# Patient Record
Sex: Male | Born: 1950 | Race: White | Hispanic: No | Marital: Married | Smoking: Current every day smoker
Health system: Southern US, Community
[De-identification: ages and names within clinical notes are randomized; demographics above are authoritative.]

## PROBLEM LIST (undated history)

## (undated) DIAGNOSIS — J449 Chronic obstructive pulmonary disease, unspecified: Secondary | ICD-10-CM

## (undated) DIAGNOSIS — F419 Anxiety disorder, unspecified: Secondary | ICD-10-CM

## (undated) DIAGNOSIS — T7840XA Allergy, unspecified, initial encounter: Secondary | ICD-10-CM

## (undated) DIAGNOSIS — R351 Nocturia: Secondary | ICD-10-CM

## (undated) DIAGNOSIS — N401 Enlarged prostate with lower urinary tract symptoms: Secondary | ICD-10-CM

## (undated) DIAGNOSIS — K148 Other diseases of tongue: Secondary | ICD-10-CM

## (undated) DIAGNOSIS — K219 Gastro-esophageal reflux disease without esophagitis: Secondary | ICD-10-CM

## (undated) DIAGNOSIS — I739 Peripheral vascular disease, unspecified: Secondary | ICD-10-CM

## (undated) DIAGNOSIS — M1990 Unspecified osteoarthritis, unspecified site: Secondary | ICD-10-CM

## (undated) DIAGNOSIS — N529 Male erectile dysfunction, unspecified: Secondary | ICD-10-CM

## (undated) DIAGNOSIS — H269 Unspecified cataract: Secondary | ICD-10-CM

## (undated) DIAGNOSIS — E785 Hyperlipidemia, unspecified: Secondary | ICD-10-CM

## (undated) DIAGNOSIS — N138 Other obstructive and reflux uropathy: Secondary | ICD-10-CM

## (undated) DIAGNOSIS — C61 Malignant neoplasm of prostate: Secondary | ICD-10-CM

## (undated) DIAGNOSIS — M810 Age-related osteoporosis without current pathological fracture: Secondary | ICD-10-CM

## (undated) DIAGNOSIS — G2581 Restless legs syndrome: Secondary | ICD-10-CM

## (undated) HISTORY — PX: NOSE SURGERY: SHX723

## (undated) HISTORY — DX: Chronic obstructive pulmonary disease, unspecified: J44.9

## (undated) HISTORY — PX: HERNIA REPAIR: SHX51

## (undated) HISTORY — DX: Age-related osteoporosis without current pathological fracture: M81.0

## (undated) HISTORY — DX: Hyperlipidemia, unspecified: E78.5

## (undated) HISTORY — DX: Peripheral vascular disease, unspecified: I73.9

## (undated) HISTORY — DX: Restless legs syndrome: G25.81

## (undated) HISTORY — DX: Gastro-esophageal reflux disease without esophagitis: K21.9

## (undated) HISTORY — PX: KNEE SURGERY: SHX244

## (undated) HISTORY — PX: JOINT REPLACEMENT: SHX530

## (undated) HISTORY — DX: Unspecified cataract: H26.9

## (undated) HISTORY — DX: Unspecified osteoarthritis, unspecified site: M19.90

---

## 2002-07-27 ENCOUNTER — Ambulatory Visit (HOSPITAL_COMMUNITY): Admission: RE | Admit: 2002-07-27 | Discharge: 2002-07-27 | Payer: Self-pay | Admitting: Family Medicine

## 2002-07-27 ENCOUNTER — Encounter: Payer: Self-pay | Admitting: Family Medicine

## 2002-07-31 ENCOUNTER — Ambulatory Visit (HOSPITAL_COMMUNITY): Admission: RE | Admit: 2002-07-31 | Discharge: 2002-07-31 | Payer: Self-pay | Admitting: Family Medicine

## 2002-07-31 ENCOUNTER — Encounter: Payer: Self-pay | Admitting: Family Medicine

## 2002-08-03 ENCOUNTER — Encounter: Payer: Self-pay | Admitting: Family Medicine

## 2002-08-03 ENCOUNTER — Ambulatory Visit (HOSPITAL_COMMUNITY): Admission: RE | Admit: 2002-08-03 | Discharge: 2002-08-03 | Payer: Self-pay | Admitting: Family Medicine

## 2002-08-14 ENCOUNTER — Ambulatory Visit (HOSPITAL_COMMUNITY): Admission: RE | Admit: 2002-08-14 | Discharge: 2002-08-14 | Payer: Self-pay | Admitting: Family Medicine

## 2002-08-14 ENCOUNTER — Encounter: Payer: Self-pay | Admitting: Family Medicine

## 2003-06-04 ENCOUNTER — Ambulatory Visit (HOSPITAL_COMMUNITY): Admission: RE | Admit: 2003-06-04 | Discharge: 2003-06-04 | Payer: Self-pay | Admitting: Family Medicine

## 2005-07-27 ENCOUNTER — Ambulatory Visit (HOSPITAL_COMMUNITY): Admission: RE | Admit: 2005-07-27 | Discharge: 2005-07-27 | Payer: Self-pay | Admitting: Family Medicine

## 2008-07-20 ENCOUNTER — Ambulatory Visit: Payer: Self-pay | Admitting: Internal Medicine

## 2008-07-20 DIAGNOSIS — E785 Hyperlipidemia, unspecified: Secondary | ICD-10-CM | POA: Insufficient documentation

## 2008-07-20 DIAGNOSIS — M1990 Unspecified osteoarthritis, unspecified site: Secondary | ICD-10-CM | POA: Insufficient documentation

## 2008-07-20 DIAGNOSIS — F17200 Nicotine dependence, unspecified, uncomplicated: Secondary | ICD-10-CM | POA: Insufficient documentation

## 2008-07-25 LAB — CONVERTED CEMR LAB
ALT: 17 units/L (ref 0–53)
AST: 19 units/L (ref 0–37)
Albumin: 4.6 g/dL (ref 3.5–5.2)
Alkaline Phosphatase: 76 units/L (ref 39–117)
BUN: 11 mg/dL (ref 6–23)
CO2: 19 meq/L (ref 19–32)
Calcium: 8.5 mg/dL (ref 8.4–10.5)
Chloride: 107 meq/L (ref 96–112)
Cholesterol: 197 mg/dL (ref 0–200)
Creatinine, Ser: 0.94 mg/dL (ref 0.40–1.50)
Glucose, Bld: 78 mg/dL (ref 70–99)
HDL: 57 mg/dL (ref >39)
LDL Cholesterol: 117 mg/dL — ABNORMAL HIGH (ref 0–99)
Potassium: 4 meq/L (ref 3.5–5.3)
Sodium: 143 meq/L (ref 135–145)
Total Bilirubin: 0.6 mg/dL (ref 0.3–1.2)
Total CHOL/HDL Ratio: 3.5
Total Protein: 6.8 g/dL (ref 6.0–8.3)
Triglycerides: 116 mg/dL (ref <150)
VLDL: 23 mg/dL (ref 0–40)

## 2008-08-13 ENCOUNTER — Encounter (INDEPENDENT_AMBULATORY_CARE_PROVIDER_SITE_OTHER): Payer: Self-pay | Admitting: Internal Medicine

## 2008-08-23 ENCOUNTER — Encounter (INDEPENDENT_AMBULATORY_CARE_PROVIDER_SITE_OTHER): Payer: Self-pay | Admitting: Internal Medicine

## 2008-10-22 ENCOUNTER — Encounter (INDEPENDENT_AMBULATORY_CARE_PROVIDER_SITE_OTHER): Payer: Self-pay | Admitting: Internal Medicine

## 2008-11-02 ENCOUNTER — Encounter (INDEPENDENT_AMBULATORY_CARE_PROVIDER_SITE_OTHER): Payer: Self-pay | Admitting: Internal Medicine

## 2010-10-03 ENCOUNTER — Encounter: Payer: Self-pay | Admitting: Surgery

## 2010-10-06 ENCOUNTER — Encounter: Payer: Self-pay | Admitting: Surgery

## 2010-10-06 ENCOUNTER — Ambulatory Visit (INDEPENDENT_AMBULATORY_CARE_PROVIDER_SITE_OTHER): Payer: BC Managed Care – PPO | Admitting: Surgery

## 2010-10-06 VITALS — BP 161/97 | HR 57 | Ht 68.0 in | Wt 150.0 lb

## 2010-10-06 DIAGNOSIS — I70229 Atherosclerosis of native arteries of extremities with rest pain, unspecified extremity: Secondary | ICD-10-CM

## 2010-10-06 NOTE — Progress Notes (Signed)
Subjective:     Patient ID: Cory Foley, male   DOB: 05-23-1950, 60 y.o.   MRN: [id number] Chief complaint: Painful left fifth toe HPI  The patient is a 60 year old gentleman who is referred by Dr. Sharlet Salina for evaluation of a painful blue left fifth toe in the setting of arterial insufficiency. The patient has been complaining of claudication like symptoms which began in the beginning of July. Shortly thereafter, approximately 4-5 days, the patient began noticing a bluish discoloration in his left fifth toe. He has been having pain in this area ever sense. The pain comes on at random. He is now taking pain narcotics to alleviate his symptoms. He describes it as if a hot coal is touching his toe. There are no other aggravating factors.  The patient has been relatively healthy his whole life he does have a significant smoking history. In addition he has a family history was positive for coronary artery disease. Review of Systems  Constitutional: Positive for fatigue. Negative for fever and chills.  All other systems reviewed and are negative.   Past Medical History  Diagnosis Date  . SOB (shortness of breath) on exertion   . Arthritis   . PVD (peripheral vascular disease)     History  Substance Use Topics  . Smoking status: Current Everyday Smoker -- 0.5 packs/day for 42 years    Types: Cigarettes  . Smokeless tobacco: Not on file   Comment: Pt has reduced smoking to 1/4 ppd  . Alcohol Use: 10.8 oz/week    18 Cans of beer per week    Family History  Problem Relation Age of Onset  . Heart disease Mother     Valve regurgitation and Pacemaker   . Heart disease Father     CABG x 5  . Heart disease Sister     aortic valve replacement  . Cancer Sister     Colon cancer w/ metastasis    No Known Allergies  Current outpatient prescriptions:HYDROcodone-acetaminophen (LORTAB 5) 5-500 MG per tablet, Take 1 tablet by mouth every 4 (four) hours as needed.  , Disp: , Rfl:   Filed  Vitals:   10/06/10 1035  Height: 5\' 8"  (1.727 m)  Weight: 150 lb (68.04 kg)    Body mass index is 22.81 kg/(m^2).           Objective:   Physical Exam  Constitutional: He is oriented to person, place, and time. He appears well-developed and well-nourished.  HENT:  Head: Normocephalic and atraumatic.  Neck: Neck supple.  Cardiovascular: Normal rate, regular rhythm and normal heart sounds.        Palpable left popliteal pulse  Pulmonary/Chest: Effort normal. He has no wheezes. He has no rales.  Abdominal: Soft. He exhibits no mass.  Musculoskeletal: Normal range of motion. He exhibits no edema.  Lymphadenopathy:    He has no cervical adenopathy.  Neurological: He is alert and oriented to person, place, and time.  Skin: Skin is warm.       Left fifth toe is discolored with bluish discoloration down to the metatarsal head there are no open area      diagnostic studies: The patient comes with a ultrasound from radiology shows the right ABI is 1.1 the left ABI is 0.73 the left toe pressure is 51 findings were consistent with below knee infrapopliteal small vessel disease in the left leg Assessment:     Blue left fifth toe    Plan:     Most likely  the patient's findings are consistent with atherosclerotic disease within his left leg arterial system. The ultrasound findings to suggest this. I do believe he needs to be further evaluated with angiography. Hopefully at that time he will have her lesion that'll be amenable to angioplasty and/or stenting. I did discuss with him that he may not have options for revascularization or he may need to be a surgical candidate. I also reiterated that he is at risk for losing his fifth toe. We discussed the risks and benefits of the procedure including the risk of bleeding risk of distal embolization and possible emergent operation. I will plan on accessing the right groin insetting the left leg with intervention if possible. This procedure has  been scheduled for Tuesday, August 21  I did discuss the importance of smoking cessation as this is likely having an a profound effect on his health. He is in the process of trying to quit cigarettes.  I also discussed that this could be an embolic process we will proceed with angiography but if this does not appear to provide adequate explanation for his symptoms will need to undergo an embolic workup. In addition the patient will need a carotid ultrasound.  Place the patient on baby aspirin today.

## 2010-10-09 ENCOUNTER — Ambulatory Visit (INDEPENDENT_AMBULATORY_CARE_PROVIDER_SITE_OTHER): Payer: BC Managed Care – PPO | Admitting: Family Medicine

## 2010-10-09 ENCOUNTER — Encounter: Payer: Self-pay | Admitting: Family Medicine

## 2010-10-09 VITALS — BP 120/80 | HR 66 | Ht 67.5 in | Wt 156.1 lb

## 2010-10-09 DIAGNOSIS — E785 Hyperlipidemia, unspecified: Secondary | ICD-10-CM

## 2010-10-09 DIAGNOSIS — F17200 Nicotine dependence, unspecified, uncomplicated: Secondary | ICD-10-CM

## 2010-10-09 DIAGNOSIS — I739 Peripheral vascular disease, unspecified: Secondary | ICD-10-CM

## 2010-10-09 NOTE — Assessment & Plan Note (Signed)
Discussed cessation of tobacco. We also discussed his per old vascular disease in the setting of visits of his tobacco use. He states he's tried Chantix, patches in the past which have not helped. He would like to quit smoking on his own

## 2010-10-09 NOTE — Assessment & Plan Note (Signed)
Patient will have fasting lipid panel done. I'm very concerned as he now has peripheral vascular disease his cholesterol may be severely elevated

## 2010-10-09 NOTE — Progress Notes (Signed)
  Subjective:    Patient ID: Cory Foley, male    DOB: 1950-05-31, 60 y.o.   MRN: [id number]  HPI Patient here to establish care. He has not seen a primary care provider in greater than 2 years. Medications and history were reviewed He has no specific concerns today.  Peripheral vascular disease- July of 2012 patient noticed that he was having cramping with exertion, in his lower extremities. Subsequently thereafter he noticed that his left fifth digit on his foot was turning blue. He was seen by a foot specialist in [place]. There was concern for peripheral vascular disease after imaging was obtained at [facility]. Patient was seen this week by a vascular specialist in [place]. He is set up for catheterization intervention for his peripheral vascular disease on next Tuesday. He has been using hydrocodone very rarely for his pain. He prefers not to take any medications. Rest does help. His fifth digit has a very severe burning sensation. He's been using a Betadine cream given by the foot doctor secondary concern for dry gangrene that may set in due to poor circulation. He denies any chest pain, no history of heart attack, no history of stroke.  He declines Colonoscopy Over due for labs and cholesterol panel Overdue for physical exam  No history of high blood pressure, diabetes, he has had mildly elevated cholesterol in the past  Tobacco- he has cut back on his cigarette smoking. He has smoked for greater than 30 years  Review of Systems  GEN- denies fatigue, fever, weight loss,weakness, recent illness CVS- denies chest pain, palpitations RESP- occ SOB, cough, wheeze ABD- denies N/V, change in stools, abd pain GU- denies dysuria, hematuria, dribbling, incontinence MSK- denies joint pain,+ muscle aches, injury Neuro- denies headache, dizziness, syncope, seizure activity      Objective:   Physical Exam GEN- NAD, alert and oriented x3 HEENT- PERRL, EOMI, non injected sclera,  pink conjunctiva, MMM, oropharynx clear Neck- Supple, no thryomegaly, no carotid bruit CVS- RRR, no murmur RESP-CTAB Abd- no abdominal bruit EXT- No edema Pulses- Radial 2+ bilat,  DP- 1+ Left, 2+ right, Popliteal- 2+        Assessment & Plan:

## 2010-10-09 NOTE — Patient Instructions (Signed)
When you get your labs done, do not eat after midnight Schedule a physical after you have had your surgery for your leg  I advise you to quit smoking. It was great to meet you today.

## 2010-10-09 NOTE — Assessment & Plan Note (Signed)
The patient establish with vascular surgery. Will followup status post intervention.

## 2010-10-13 LAB — COMPREHENSIVE METABOLIC PANEL
ALT: 15 U/L (ref 0–53)
AST: 15 U/L (ref 0–37)
Albumin: 3.9 g/dL (ref 3.5–5.2)
Alkaline Phosphatase: 67 U/L (ref 39–117)
BUN: 10 mg/dL (ref 6–23)
CO2: 26 mEq/L (ref 19–32)
Calcium: 9.2 mg/dL (ref 8.4–10.5)
Chloride: 108 mEq/L (ref 96–112)
Creat: 0.84 mg/dL (ref 0.50–1.35)
Glucose, Bld: 92 mg/dL (ref 70–99)
Potassium: 5.3 mEq/L (ref 3.5–5.3)
Sodium: 143 mEq/L (ref 135–145)
Total Bilirubin: 0.3 mg/dL (ref 0.3–1.2)
Total Protein: 6.3 g/dL (ref 6.0–8.3)

## 2010-10-14 ENCOUNTER — Other Ambulatory Visit (HOSPITAL_COMMUNITY): Payer: BC Managed Care – PPO

## 2010-10-14 ENCOUNTER — Ambulatory Visit (HOSPITAL_COMMUNITY): Payer: BC Managed Care – PPO

## 2010-10-14 ENCOUNTER — Ambulatory Visit (HOSPITAL_COMMUNITY)
Admission: RE | Admit: 2010-10-14 | Discharge: 2010-10-14 | Disposition: A | Discharge status: Home / Self Care | Payer: BC Managed Care – PPO | Source: Ambulatory Visit | Attending: Surgery | Admitting: Surgery

## 2010-10-14 DIAGNOSIS — I70219 Atherosclerosis of native arteries of extremities with intermittent claudication, unspecified extremity: Secondary | ICD-10-CM

## 2010-10-14 LAB — TYPE AND SCREEN
ABO/RH(D): A POS
Antibody Screen: NEGATIVE

## 2010-10-14 LAB — POCT I-STAT, CHEM 8
BUN: 9 mg/dL (ref 6–23)
Calcium, Ion: 1.16 mmol/L (ref 1.12–1.32)
Chloride: 108 mEq/L (ref 96–112)
Creatinine, Ser: 1 mg/dL (ref 0.50–1.35)
Glucose, Bld: 90 mg/dL (ref 70–99)
HCT: 43 % (ref 39.0–52.0)
Hemoglobin: 14.6 g/dL (ref 13.0–17.0)
Potassium: 5 mEq/L (ref 3.5–5.1)
Sodium: 142 mEq/L (ref 135–145)
TCO2: 26 mmol/L (ref 0–100)

## 2010-10-14 LAB — CBC
HCT: 38.1 % — ABNORMAL LOW (ref 39.0–52.0)
Hemoglobin: 12.9 g/dL — ABNORMAL LOW (ref 13.0–17.0)
MCH: 33.3 pg (ref 26.0–34.0)
MCHC: 33.9 g/dL (ref 30.0–36.0)
MCV: 98.4 fL (ref 78.0–100.0)
Platelets: 169 10*3/uL (ref 150–400)
RBC: 3.87 MIL/uL — ABNORMAL LOW (ref 4.22–5.81)
RDW: 14 % (ref 11.5–15.5)
WBC: 4.7 10*3/uL (ref 4.0–10.5)

## 2010-10-14 LAB — COMPREHENSIVE METABOLIC PANEL
ALT: 13 U/L (ref 0–53)
AST: 15 U/L (ref 0–37)
Albumin: 3 g/dL — ABNORMAL LOW (ref 3.5–5.2)
Alkaline Phosphatase: 67 U/L (ref 39–117)
BUN: 10 mg/dL (ref 6–23)
CO2: 26 mEq/L (ref 19–32)
Calcium: 8.5 mg/dL (ref 8.4–10.5)
Chloride: 110 mEq/L (ref 96–112)
Creatinine, Ser: 0.71 mg/dL (ref 0.50–1.35)
GFR calc Af Amer: >60 mL/min (ref >60)
GFR calc non Af Amer: >60 mL/min (ref >60)
Glucose, Bld: 123 mg/dL — ABNORMAL HIGH (ref 70–99)
Potassium: 3.8 mEq/L (ref 3.5–5.1)
Sodium: 143 mEq/L (ref 135–145)
Total Bilirubin: 0.3 mg/dL (ref 0.3–1.2)
Total Protein: 5.7 g/dL — ABNORMAL LOW (ref 6.0–8.3)

## 2010-10-14 LAB — SURGICAL PCR SCREEN
MRSA, PCR: NEGATIVE
Staphylococcus aureus: NEGATIVE

## 2010-10-14 LAB — ABO/RH: ABO/RH(D): A POS

## 2010-10-14 LAB — PROTIME-INR
INR: 0.88 (ref 0.00–1.49)
Prothrombin Time: 12.1 seconds (ref 11.6–15.2)

## 2010-10-14 LAB — APTT: aPTT: 27 seconds (ref 24–37)

## 2010-10-16 ENCOUNTER — Other Ambulatory Visit (HOSPITAL_COMMUNITY): Payer: BC Managed Care – PPO

## 2010-10-17 ENCOUNTER — Ambulatory Visit (INDEPENDENT_AMBULATORY_CARE_PROVIDER_SITE_OTHER): Payer: BC Managed Care – PPO

## 2010-10-17 ENCOUNTER — Other Ambulatory Visit (INDEPENDENT_AMBULATORY_CARE_PROVIDER_SITE_OTHER): Payer: BC Managed Care – PPO | Admitting: *Deleted

## 2010-10-17 DIAGNOSIS — Z01810 Encounter for preprocedural cardiovascular examination: Secondary | ICD-10-CM

## 2010-10-17 DIAGNOSIS — I739 Peripheral vascular disease, unspecified: Secondary | ICD-10-CM

## 2010-10-20 ENCOUNTER — Encounter: Payer: Self-pay | Admitting: Surgery

## 2010-10-20 ENCOUNTER — Ambulatory Visit (INDEPENDENT_AMBULATORY_CARE_PROVIDER_SITE_OTHER): Payer: BC Managed Care – PPO | Admitting: Surgery

## 2010-10-20 VITALS — BP 151/90 | HR 53 | Resp 20 | Ht 67.5 in | Wt 155.0 lb

## 2010-10-20 DIAGNOSIS — L98499 Non-pressure chronic ulcer of skin of other sites with unspecified severity: Secondary | ICD-10-CM

## 2010-10-20 DIAGNOSIS — I739 Peripheral vascular disease, unspecified: Secondary | ICD-10-CM

## 2010-10-20 NOTE — Progress Notes (Signed)
Subjective:     Patient ID: Cory Foley, male   DOB: [birth year], 60 y.o.   MRN: [id number]  HPI  This is a 60 year old gentleman that I initially saw at the request of Dr. Sharlet Salina for evaluation of a painful blue left fifth toe in the setting of arterial insufficiency. The patient also complained of claudication like symptoms which began in July. Shortly after his symptoms began he noticed a bluish discoloration of his left fifth toe. He has been having difficulty since that time. The pain comes on at random there are no alleviating factors except for pain medicine he describes as feeling as a hot: That his toe.  The patient recently underwent angiography which revealed an occluded left popliteal artery. He was not a good candidate for endovascular repair and comes back today for discussions of surgical treatment. Last week he was sent for right Myoview carotid ultrasound and leg vein mapping.  The patient has history that is positive for tobacco. He also suffers from arthritis. We have previously discussed the need for smoking cessation  Review of Systems Pertinent review of systems are detailed in the history of present illness. All other systems are negative as documented in the patient encountered form    Past Medical History  Diagnosis Date  . SOB (shortness of breath) on exertion   . PVD (peripheral vascular disease)   . Arthritis     knees    History  Substance Use Topics  . Smoking status: Current Everyday Smoker -- 0.5 packs/day for 42 years    Types: Cigarettes  . Smokeless tobacco: Not on file   Comment: Pt has reduced smoking to 1/4 ppd  . Alcohol Use: 10.8 oz/week    18 Cans of beer per week    Family History  Problem Relation Age of Onset  . Heart disease Mother     Valve regurgitation and Pacemaker   . Heart disease Father     CABG x 5  . Heart disease Sister     aortic valve replacement  . Cancer Sister     Colon cancer w/ metastasis    No Known  Allergies  Current outpatient prescriptions:aspirin 81 MG tablet, Take 81 mg by mouth daily.  , Disp: , Rfl: ;  HYDROcodone-acetaminophen (LORTAB 5) 5-500 MG per tablet, Take 1 tablet by mouth every 4 (four) hours as needed.  , Disp: , Rfl:   Filed Vitals:   10/20/10 0905  Height: 5' 7.5" (1.715 m)  Weight: 155 lb (70.308 kg)    Body mass index is 23.92 kg/(m^2).       Objective:   Physical Exam  Constitutional: He is oriented to person, place, and time. He appears well-developed and well-nourished.  HENT:  Head: Normocephalic and atraumatic.  Neck: Neck supple.  Cardiovascular: Normal rate and regular rhythm.        Pedal pulses not palpable on the left  Pulmonary/Chest: Effort normal.  Abdominal: Soft.  Musculoskeletal: Normal range of motion.  Neurological: He is alert and oriented to person, place, and time.  Skin: Skin is warm.       The left fifth toe is slightly more bluish in discoloration. There is the beginnings of an ulcer on the plantar side of the toe.   Diagnostic studies: Cardiac Myoview is normal carotid duplex shows minimal disease bilaterally. Vein mapping shows adequate saphenous vein from hip to knee on the left    Assessment:     Left fifth toe wound, peripheral  vascular disease    Plan:     I discussed the angiographic findings today with the patient. I do not think he is a good candidate for endovascular repair and would be best treated with surgical bypass. I believe this can be an above-knee to below-knee popliteal artery bypass graft with saphenous vein harvested from the ipsilateral leg. We discussed the risks and benefits of the operation including the risk of infection the risk of edema wound healing and the recovery time. We also discussed that his left fifth toe may not be salvageable. However, I feel that observation after the bypass graft is the best option to see if the toe demarcates or heels. His operation has been scheduled for tomorrow  August 28.

## 2010-10-21 ENCOUNTER — Other Ambulatory Visit: Payer: Self-pay | Admitting: Surgery

## 2010-10-21 ENCOUNTER — Inpatient Hospital Stay (HOSPITAL_COMMUNITY): Payer: BC Managed Care – PPO

## 2010-10-21 ENCOUNTER — Inpatient Hospital Stay (HOSPITAL_COMMUNITY)
Admission: RE | Admit: 2010-10-21 | Discharge: 2010-10-24 | DRG: 797 | Disposition: A | Discharge status: Home / Self Care | Payer: BC Managed Care – PPO | Source: Ambulatory Visit | Attending: Surgery | Admitting: Surgery

## 2010-10-21 DIAGNOSIS — F17200 Nicotine dependence, unspecified, uncomplicated: Secondary | ICD-10-CM | POA: Diagnosis present

## 2010-10-21 DIAGNOSIS — Z96659 Presence of unspecified artificial knee joint: Secondary | ICD-10-CM

## 2010-10-21 DIAGNOSIS — I70219 Atherosclerosis of native arteries of extremities with intermittent claudication, unspecified extremity: Principal | ICD-10-CM | POA: Diagnosis present

## 2010-10-21 DIAGNOSIS — Z7982 Long term (current) use of aspirin: Secondary | ICD-10-CM

## 2010-10-21 HISTORY — PX: PR VEIN BYPASS GRAFT,AORTO-FEM-POP: 35551

## 2010-10-21 LAB — TYPE AND SCREEN
ABO/RH(D): A POS
Antibody Screen: NEGATIVE

## 2010-10-22 DIAGNOSIS — Z48812 Encounter for surgical aftercare following surgery on the circulatory system: Secondary | ICD-10-CM

## 2010-10-22 LAB — BASIC METABOLIC PANEL
BUN: 7 mg/dL (ref 6–23)
CO2: 28 mEq/L (ref 19–32)
Calcium: 8.6 mg/dL (ref 8.4–10.5)
Chloride: 108 mEq/L (ref 96–112)
Creatinine, Ser: 0.77 mg/dL (ref 0.50–1.35)
GFR calc Af Amer: >60 mL/min (ref >60)
GFR calc non Af Amer: >60 mL/min (ref >60)
Glucose, Bld: 115 mg/dL — ABNORMAL HIGH (ref 70–99)
Potassium: 4.3 mEq/L (ref 3.5–5.1)
Sodium: 141 mEq/L (ref 135–145)

## 2010-10-22 LAB — CBC
HCT: 37.6 % — ABNORMAL LOW (ref 39.0–52.0)
Hemoglobin: 12.6 g/dL — ABNORMAL LOW (ref 13.0–17.0)
MCH: 33.5 pg (ref 26.0–34.0)
MCHC: 33.5 g/dL (ref 30.0–36.0)
MCV: 100 fL (ref 78.0–100.0)
Platelets: 165 10*3/uL (ref 150–400)
RBC: 3.76 MIL/uL — ABNORMAL LOW (ref 4.22–5.81)
RDW: 13.9 % (ref 11.5–15.5)
WBC: 6.5 10*3/uL (ref 4.0–10.5)

## 2010-10-23 LAB — BASIC METABOLIC PANEL
BUN: 8 mg/dL (ref 6–23)
CO2: 30 mEq/L (ref 19–32)
Calcium: 8.8 mg/dL (ref 8.4–10.5)
Chloride: 106 mEq/L (ref 96–112)
Creatinine, Ser: 0.78 mg/dL (ref 0.50–1.35)
GFR calc Af Amer: >60 mL/min (ref >60)
GFR calc non Af Amer: >60 mL/min (ref >60)
Glucose, Bld: 105 mg/dL — ABNORMAL HIGH (ref 70–99)
Potassium: 4.8 mEq/L (ref 3.5–5.1)
Sodium: 140 mEq/L (ref 135–145)

## 2010-10-23 LAB — CARDIAC PANEL(CRET KIN+CKTOT+MB+TROPI)
CK, MB: 1.7 ng/mL (ref 0.3–4.0)
Relative Index: INVALID (ref 0.0–2.5)
Total CK: 65 U/L (ref 7–232)
Troponin I: <0.3 ng/mL (ref <0.30)

## 2010-10-23 NOTE — Op Note (Signed)
  Cory Foley, DEVONSHIRE                ACCOUNT NO.:  [id number]  MEDICAL RECORD NO.:  [id number]  LOCATION:  [place]                         FACILITY:  [facility]  PHYSICIAN:  Juleen China IV, MDDATE OF BIRTH:  [birth year]  DATE OF PROCEDURE:  10/14/2010 DATE OF DISCHARGE:                              OPERATIVE REPORT   PREOPERATIVE DIAGNOSIS:  Left fifth toe ischemia.  POSTOPERATIVE DIAGNOSIS:  Left fifth toe ischemia.  PROCEDURES PERFORMED: 1. Ultrasound access, right femoral artery. 2. Abdominal aortogram. 3. Bilateral runoff. 4. Second-order catheterization.  INDICATIONS:  This is a 60 year old gentleman who presented to the office with left fifth toe ischemia.  An ultrasound that showed the arterial occlusive disease.  He comes today for arteriogram and possible intervention.  PROCEDURE:  The patient was identified in the holding and taken to room #8, placed supine on the table.  Both groins were prepped and draped in the usual fashion. A time-out was called.  The tight femoral artery was divided with ultrasound and found to be widely patent.  A digital ultrasound image was acquired.  The right femoral artery was then accessed under ultrasound guidance with an 18-gauge needle and an 0.035 wire was advanced into the aorta under fluoroscopic visualization.  A 5- French sheath was placed.  Over the wire, an Omni flush catheter was advanced to the level of L1.  Abdominal aortogram was obtained.  Next, using the Omni flush catheter and Bentson wire, the aortic bifurcation was crossed.  Catheter was placed in the left external iliac artery and left leg runoff was performed.  Retrograde injections through the sheath were done to evaluate the right leg.  FINDINGS:  Aortogram:  The visualized portions of suprarenal abdominal aorta showed no significant disease.  There are single renal arteries bilaterally which are widely patent.  The infrarenal abdominal aorta is widely patent.   Bilateral common external and internal iliac arteries are widely patent.  Left lower extremity:  Left common femoral artery is widely patent. Left profunda femoral is widely patent.  Left superficial femoral artery is widely patent.  There is occlusion of the popliteal artery behind the knee at the level of the patella with reconstitution from large popliteal collaterals of the below-knee popliteal artery.  There does appear to be three-vessel runoff.  Right lower extremity:  The right common femoral artery is widely patent.  The right profunda femoral artery is widely patent.  The right superficial femoral artery is widely patent.  Right popliteal artery is widely patent.  There is three-vessel runoff.  After above images were obtained, decision was made to terminate the procedure.  Catheters and wires were removed.  The patient was taken to the holding area for sheath pull.  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  The patient is not a good candidate for percutaneous revascularization and consideration will be made for surgical repair.     Jorge Ny, MD     VWB/MEDQ  D:  10/14/2010  T:  10/14/2010  Job:  [id number]  Electronically Signed by Arelia Longest IV MD on 10/23/2010 12:23:44 AM

## 2010-10-23 NOTE — Op Note (Signed)
NAMESAMARION, EHLE                ACCOUNT NO.:  [id number]  MEDICAL RECORD NO.:  [id number]  LOCATION:  3305                         FACILITY:  [facility]  PHYSICIAN:  Juleen China IV, MDDATE OF BIRTH:  1950-05-06  DATE OF PROCEDURE:  10/21/2010 DATE OF DISCHARGE:                              OPERATIVE REPORT   PREOPERATIVE DIAGNOSIS:  Ischemic left toe.  POSTOPERATIVE DIAGNOSIS:  Ischemic left toe.  PROCEDURES PERFORMED: 1. Left above-knee to below-knee popliteal artery bypass graft with     reversed ipsilateral greater saphenous vein. 2. Intraoperative angiogram.  SURGEON: 1. Charlena Cross, MD  ASSISTANT:  Pecola Leisure, PA  ANESTHESIA:  General.  BLOOD LOSS:  100 mL.  SPECIMENS:  Popliteal thrombus.  INDICATIONS:  This is a 60 year old gentleman who initially presented with an ischemic toe for several weeks.  He was taken for angiogram and found to have an occluded popliteal artery.  He comes in today for his bypass.  PROCEDURE:  The patient was identified in the holding area, taken to room 7, and placed supine on the table.  General anesthesia was administered.  The patient was prepped and draped in usual fashion. Time-out was called.  Antibiotics were given.  Ultrasound was used to map the course of the cephalic vein in the leg.  I initially began by making a below-knee incision.  The saphenous vein was visualized within this area and felt to not be an adequate conduit.  I proceeded with exposure of the popliteal artery through this incision.  Popliteal artery was approximately 3.5 mm without significant calcification. Next, an incision was made for the above-knee popliteal artery exposure. The artery had an excellent pulse within it.  It was fully exposed. Through the same incision, I exposed the greater saphenous vein.  This measured approximately 3.5-4 mm.  It was mobilized throughout the length of this incision and a more proximal incision was also  made until adequate length of saphenous vein had been harvested.  Side branches were ligated between 3-0 silk ties and metal clips.  Next, I used an uterine dressing forceps to create a tunnel between the 2 incisions between the heads of gastrocnemius muscle.  An umbilical tape was passed through this tunnel.  I also used the umbilical tape to ensure that I had adequate length of vein.  Next, a right angle clamp was placed proximally and distally on the vein and the vein was transected.  Each end was tied off with a 2-0 silk tie.  At this point, the patient was fully heparinized.  The vein was then distended.  It distended nicely to about 4 mm.  At this point and time, heparin had circulated.  I placed a stockinette on the upper thigh followed by tourniquet.  The leg was exsanguinated with an Esmarch.  Tourniquet was taken at 250 mm of pressure.  Next, I used an 11 blade to open the above-knee popliteal artery, which was extended longitudinally with Potts scissors.  I placed the vein in a reversed fashion and spatulated the vein to fit the size of the arteriotomy.  A running end-to-side anastomosis was created with 5-0 Prolene.  Once the anastomosis was completed,  the tourniquet was let down.  Anastomosis was hemostatic, and there was excellent pulsatile flow through the vein graft.  Next, the vein was brought through the previously created tunnel making sure to maintain proper orientation. When the vein was brought through the tunnel, I re-exsanguinated the leg with an Esmarch and the tourniquet was reinflated to 250 mm of pressure. Next, an 11 blade was used to make an arteriotomy, which was extended longitudinally with Potts scissors in the popliteal artery.  I could see a piece of subacute thrombus within the proximal popliteal vein.  I pulled this out.  It was about a 2.5-cm piece of thrombus.  This was sent for pathology.  I then cut the vein to the appropriate length and then  spatulated it to fit the size of the arteriotomy.  A running anastomosis was created with 6-0 Prolene prior to completion. Appropriate flush maneuvers were performed and the anastomosis was completed.  I did have Doppler signals in the anterior tibial and posterior tibial artery at the end of the case, which dampened with graft compression.  An intraoperative arteriogram was performed, which showed no evidence of distal bypass graft stenosis and runoff that was similar to his angiogram.  At this point, the patient's heparin was reversed with 50 mg of protamine.  Once the hemostasis was achieved, I closed the vein harvest site with 2 layers of 3-0 Vicryl.  In the above and below-knee incision, the fascia was reapproximated with 2-0 Vicryl. Subcutaneous tissue was closed with 3-0 Vicryl and the skin was closed with 4-0 Vicryl.  Dermabond was placed in the wounds.  The patient tolerated the procedure well with no complications.     Jorge Ny, MD     VWB/MEDQ  D:  10/21/2010  T:  10/21/2010  Job:  [id number]  Electronically Signed by Arelia Longest IV MD on 10/23/2010 12:23:50 AM

## 2010-11-10 ENCOUNTER — Telehealth: Payer: Self-pay

## 2010-11-10 NOTE — Procedures (Unsigned)
VASCULAR LAB EXAM  INDICATION:  Preop vein mapping for lower extremity bypass graft.  HISTORY: Diabetes: Cardiac: Hypertension:  EXAM:  The left great saphenous vein was mapped and found to be patent with calibers ranging from 0.45 cm to 0.19 cm. Please see attached diagram for details.  IMPRESSION:  Patent left great saphenous vein with calibers as described on attached diagram.  ___________________________________________ V. Charlena Cross, MD  LT/MEDQ  D:  10/17/2010  T:  10/17/2010  Job:  [id number]

## 2010-11-10 NOTE — Telephone Encounter (Signed)
Pt. called w/ c/o continued "swelling of left foot and lower leg, up to just above calf".  "It almost goes away at night, and during day it starts to swell again."  States that Dr. Myra Gianotti told him to expect swelling about 1-2 weeks, and tomorrow will be the 3rd wk. since surgery; pt. wants to have Dr. Myra Gianotti made aware, and advise if this is normal or concerning?  Pt. denies increased pain.  States his "incisions look fine".   Describes his swelling as "3x the normal size".  States there is some warmth and slight redness at shin.  Denies redness/warmth @ calf.  Takes ASA 81 mg qd.  Informed Dr. Myra Gianotti of pt's concerns.  States pt. should continue to monitor/ give it more time and continue to elevate.  Pt. to call if sx's worsen.  Has f/u appt. on 11/24/10.   Verbalized understanding of instructions.

## 2010-11-10 NOTE — Procedures (Unsigned)
CAROTID DUPLEX EXAM  INDICATION:  Preop evaluation for planned lower extremity bypass graft.  HISTORY: Diabetes:  No. Cardiac:  No. Hypertension:  No. Smoking:  Yes. Previous Surgery:  No. CV History: Amaurosis Fugax No, Paresthesias No, Hemiparesis No                                      RIGHT             LEFT Brachial systolic pressure: Brachial Doppler waveforms: Vertebral direction of flow:        Antegrade         Antegrade DUPLEX VELOCITIES (cm/sec) CCA peak systolic                   101               84 ECA peak systolic                   55                69 ICA peak systolic                   63                52 ICA end diastolic                   23                18 PLAQUE MORPHOLOGY: PLAQUE AMOUNT:                      Minimal           Minimal PLAQUE LOCATION:   IMPRESSION: 1. No evidence of obvious plaque in the bilateral internal carotid     artery. 2. Bilateral vertebral arteries are within normal limits.          ___________________________________________ V. Charlena Cross, MD  LT/MEDQ  D:  10/17/2010  T:  10/17/2010  Job:  [id number]

## 2010-11-17 ENCOUNTER — Encounter: Payer: Self-pay | Admitting: Surgery

## 2010-11-21 ENCOUNTER — Encounter: Payer: Self-pay | Admitting: Surgery

## 2010-11-22 NOTE — Discharge Summary (Signed)
Cory Foley, Cory Foley                ACCOUNT NO.:  [id number]  MEDICAL RECORD NO.:  [id number]  LOCATION:  2011                         FACILITY:  [facility]  PHYSICIAN:  Juleen China IV, MDDATE OF BIRTH:  1950-05-07  DATE OF ADMISSION:  10/21/2010 DATE OF DISCHARGE:  10/24/2010                              DISCHARGE SUMMARY   HISTORY OF PRESENT ILLNESS:  This is a 60 year old male that was initially seen at the request of Dr. Sharlet Salina for evaluation of a painful blue left fifth toe in the setting of arterial insufficiency. The patient also complained of claudication-like symptoms, which began in July.  Shortly after his symptoms began, he noticed a bluish discoloration of his left fifth toe.  He has had difficulty since then. There are no alleviating factors except for pain medicine, he describes it feeling of hot in that toe.  The patient recently underwent angiography, which revealed an occluded left popliteal artery.  He was not a good candidate for endovascular repair and comes back for discussion of surgical treatment.  HOSPITAL COURSE:  The patient was admitted to the hospital and taken to the operating room on October 21, 2010 when he underwent a left above-the- knee to below-knee popliteal artery bypass graft with reverse ipsilateral greater saphenous vein and intraoperative angiogram.  He tolerated the procedure well and was transported to the recovery room in satisfactory condition.  By postoperative day #1, he had unsteady gait, but otherwise was doing well.  He had positive palpable dorsalis pedis and posterior tibialis pulses on the left.  His postoperative ABIs included the right was 1.11 and the left was 0.93.  He did complain of a low chest pressure on postoperative day #2 that went away with incentive spirometry and EKG and 1 set of cardiac enzymes were obtained and they were both normal.  By postoperative day #3, the patient was ambulating much better without a  rolling walker and was discharged to home.  He continues to have palpable pedal pulses.  Otherwise, his postoperative course included increasing intake of solids without difficulty.  DISCHARGE INSTRUCTIONS:  He is discharged home with extensive instructions on wound care and progressive ambulation.  He is instructed not to drive or perform any heavy lifting for 1 month.  DISCHARGE DIAGNOSES: 1. Peripheral vascular disease.     a.     Status post left above-knee to below-knee popliteal bypass      on October 21, 2010. 2. Arthritis. 3. Chronic tobacco use. 4. History of hernia repair, bilateral inguinal herniorrhaphies x2. 5. History of joint replacement. 6. History of nose surgery. 7. History of knee surgery.  DISCHARGE MEDICATIONS: 1. Nicotine patch 21 mg daily. 2. Vicodin 5/325 one to two p.o. q.4-6 h. p.r.n. pain, #31, no refill. 3. Aspirin 81 mg p.o. daily.  FOLLOWUP:  The patient to follow with Dr. Myra Gianotti in 2 weeks.     Newton Pigg, PA   ______________________________ V. Charlena Cross, MD    SE/MEDQ  D:  10/24/2010  T:  10/24/2010  Job:  [id number]  Electronically Signed by Newton Pigg PA on 10/28/2010 08:41:42 AM Electronically Signed by Arelia Longest IV MD on 11/22/2010 09:55:28 AM

## 2010-11-24 ENCOUNTER — Ambulatory Visit (INDEPENDENT_AMBULATORY_CARE_PROVIDER_SITE_OTHER): Payer: BC Managed Care – PPO | Admitting: Surgery

## 2010-11-24 ENCOUNTER — Encounter: Payer: Self-pay | Admitting: Surgery

## 2010-11-24 VITALS — BP 128/81 | HR 75 | Temp 98.5°F | Ht 68.0 in | Wt 162.0 lb

## 2010-11-24 DIAGNOSIS — Z95828 Presence of other vascular implants and grafts: Secondary | ICD-10-CM

## 2010-11-24 DIAGNOSIS — Z9889 Other specified postprocedural states: Secondary | ICD-10-CM

## 2010-11-24 DIAGNOSIS — I739 Peripheral vascular disease, unspecified: Secondary | ICD-10-CM

## 2010-11-24 NOTE — Progress Notes (Signed)
The patient returns today for followup. He underwent left above to below knee popliteal bypass graft with reversed vein on 10/21/2010. This was done in the setting of a bluish discoloration to his left fifth toe and none of popliteal occlusion his postoperative course was uncomplicated he is back today for followup he has no complaints today.  On examination he does have 1-2+ edema in the left leg. The scab and on the left fifth toe is gone the color is much improved he has no pain in the left fifth toe his incisions are healing nicely there is a slight area of skin separation in the below-knee incision there is no drainage there is no evidence of infection he has a palpable dorsalis pedis pulse  Overall I think he is doing very well. His biggest issue his swelling like to put him in a compression stocking however with the slight skin separation in his below-knee incision and await approximately 2 weeks before I do this so they can fully healed. In the meantime he'll continue to keep his left leg elevated as suspected in 2-3 weeks he'll be able to return to work. He was placed on her ultrasound surveillance protocol I will see him back in 3 months with a repeat ultrasound.

## 2010-12-01 ENCOUNTER — Other Ambulatory Visit: Payer: Self-pay

## 2010-12-01 DIAGNOSIS — G8918 Other acute postprocedural pain: Secondary | ICD-10-CM

## 2010-12-01 MED ORDER — HYDROCODONE-ACETAMINOPHEN 5-500 MG PO TABS: 1.0000 | ORAL_TABLET | Freq: Four times a day (QID) | ORAL | Status: DC | PRN

## 2010-12-01 NOTE — Telephone Encounter (Signed)
Pt. Requests refill of Hydrocodone/Acetaminophen.  States uses mostly at night.  Rates discomfort at level 3/10.  S/p left  AK-BK popliteal BP of 10/21/10.  Per Dr. Myra Gianotti, okay to order Hydrocodone/acetaminophen per s.o.  Pt. Advised will call in refill to his pharmacy.

## 2011-01-12 ENCOUNTER — Encounter: Payer: Self-pay | Admitting: Surgery

## 2011-03-02 ENCOUNTER — Other Ambulatory Visit: Payer: BC Managed Care – PPO

## 2011-03-02 ENCOUNTER — Ambulatory Visit: Payer: BC Managed Care – PPO | Admitting: Surgery

## 2011-03-20 ENCOUNTER — Encounter: Payer: Self-pay | Admitting: Surgery

## 2011-03-23 ENCOUNTER — Other Ambulatory Visit: Payer: BC Managed Care – PPO

## 2011-03-23 ENCOUNTER — Ambulatory Visit: Payer: BC Managed Care – PPO | Admitting: Surgery

## 2011-03-23 ENCOUNTER — Other Ambulatory Visit: Payer: Self-pay | Admitting: *Deleted

## 2011-03-23 DIAGNOSIS — I739 Peripheral vascular disease, unspecified: Secondary | ICD-10-CM

## 2011-06-09 ENCOUNTER — Telehealth: Payer: Self-pay

## 2011-06-09 NOTE — Telephone Encounter (Signed)
Rec'd call from pt's wife.  Reports that pt has been having left calf pain with walking.  Reports pt. C/o his left leg aching at night.  Reports swelling in left lower leg foot to mid calf that "comes and goes".  States the color of left leg is more pale in comparison to right foot.  States that pt. C/o numbness from left foot to knee, for approx. Past 3-4 days.  States pt. has trouble laying on either hip at night, due to pain.  Hx. of left AK- BK Pop BP 09/2010.  Wife states pt. hasn't been able to follow up for ultrasounds due to financial situation.  Will discuss with Dr. Myra Gianotti and return call.

## 2011-06-10 ENCOUNTER — Ambulatory Visit (INDEPENDENT_AMBULATORY_CARE_PROVIDER_SITE_OTHER): Payer: 59 | Admitting: Vascular Surgery

## 2011-06-10 ENCOUNTER — Telehealth: Payer: Self-pay

## 2011-06-10 DIAGNOSIS — R209 Unspecified disturbances of skin sensation: Secondary | ICD-10-CM

## 2011-06-10 DIAGNOSIS — I739 Peripheral vascular disease, unspecified: Secondary | ICD-10-CM

## 2011-06-10 DIAGNOSIS — Z48812 Encounter for surgical aftercare following surgery on the circulatory system: Secondary | ICD-10-CM

## 2011-06-10 DIAGNOSIS — M79609 Pain in unspecified limb: Secondary | ICD-10-CM

## 2011-06-10 DIAGNOSIS — R200 Anesthesia of skin: Secondary | ICD-10-CM

## 2011-06-10 NOTE — Telephone Encounter (Signed)
Per written order from Dr. Myra Gianotti: "Have Cory Foley come in for a duplex of his left leg to see if his bypass is still patent and if there is a stenosis".  Will schedule pt for dupex/ABI's of left leg. Previous telephone note details pt's symptoms.

## 2011-06-10 NOTE — Progress Notes (Signed)
Ankle brachial index performed @ [facility] 06/10/2011

## 2011-06-10 NOTE — Progress Notes (Signed)
LLE arterial duplex performed @ [facility] 06/10/2011

## 2011-06-16 ENCOUNTER — Other Ambulatory Visit: Payer: Self-pay | Admitting: *Deleted

## 2011-06-16 DIAGNOSIS — Z48812 Encounter for surgical aftercare following surgery on the circulatory system: Secondary | ICD-10-CM

## 2011-06-16 DIAGNOSIS — I739 Peripheral vascular disease, unspecified: Secondary | ICD-10-CM

## 2011-06-17 ENCOUNTER — Encounter: Payer: Self-pay | Admitting: Surgery

## 2011-06-17 NOTE — Procedures (Unsigned)
BYPASS GRAFT EVALUATION  INDICATION:  Peripheral vascular disease, claudication, tingling of the left lower extremity.  HISTORY: Diabetes:  No. Cardiac:  No. Hypertension:  No. Smoking:  Currently. Previous Surgery:  Left above-knee to below-knee popliteal artery bypass graft in August 2012.  SINGLE LEVEL ARTERIAL EXAM                              RIGHT              LEFT Brachial: Anterior tibial: Posterior tibial: Peroneal: Ankle/brachial index:        1.12               1.02  PREVIOUS ABI:  Date:  RIGHT:  LEFT:  LOWER EXTREMITY BYPASS GRAFT DUPLEX EXAM:  DUPLEX:  Mild diffuse heterogenous plaque present involving the left distal external iliac artery.  IMPRESSION: 1. Patent left above-knee to below-knee popliteal bypass graft. 2. Unable to demonstrate color or spectral waveforms within the left     posterior tibial artery, suggesting vessel occlusion. 3. The remainder of the left lower extremity native arteries present     with multiphasic Doppler velocities and appear patent. 4. Bilateral ankle brachial indices are in the normal range.  ___________________________________________ V. Charlena Cross, MD  SH/MEDQ  D:  06/10/2011  T:  06/10/2011  Job:  [id number]

## 2011-07-06 ENCOUNTER — Ambulatory Visit: Payer: BC Managed Care – PPO | Admitting: Surgery

## 2011-09-04 ENCOUNTER — Encounter: Payer: Self-pay | Admitting: Surgery

## 2011-09-07 ENCOUNTER — Ambulatory Visit (INDEPENDENT_AMBULATORY_CARE_PROVIDER_SITE_OTHER): Payer: 59 | Admitting: Surgery

## 2011-09-07 ENCOUNTER — Encounter (INDEPENDENT_AMBULATORY_CARE_PROVIDER_SITE_OTHER): Payer: 59 | Admitting: *Deleted

## 2011-09-07 ENCOUNTER — Encounter: Payer: Self-pay | Admitting: Surgery

## 2011-09-07 VITALS — BP 114/78 | HR 61 | Resp 16 | Ht 68.0 in | Wt 164.7 lb

## 2011-09-07 DIAGNOSIS — I739 Peripheral vascular disease, unspecified: Secondary | ICD-10-CM

## 2011-09-07 DIAGNOSIS — Z48812 Encounter for surgical aftercare following surgery on the circulatory system: Secondary | ICD-10-CM

## 2011-09-07 NOTE — Addendum Note (Signed)
Addended by: Sharee Pimple on: 09/07/2011 02:44 PM   Modules accepted: Orders

## 2011-09-07 NOTE — Progress Notes (Signed)
[facility]   Patient name: Cory Foley MRN: [id number] DOB: [birth year] Sex: male     Chief Complaint  Patient presents with  . PVD    3 month f/up  of Aug 2012     HISTORY OF PRESENT ILLNESS: The patient is back today for followup. He is status post left above-knee to below-knee popliteal artery bypass graft with reversed ipsilateral greater saphenous vein. This was done in the setting of an ischemic left toe. 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 He did have issues with swelling postoperatively and continues to wear a compression stocking. He reports no further problems with his left fifth toe.  Past Medical History  Diagnosis Date  . SOB (shortness of breath) on exertion   . PVD (peripheral vascular disease)   . Arthritis     knees    Past Surgical History  Procedure Date  . Hernia repair     Bilateral inguinal X2  . Joint replacement     bilateral  . Nose surgery   . Knee surgery     Left Knee X 2   and Right knee X1  . Pr vein bypass graft,aorto-fem-pop 10/21/10    Left AK to BK popliteal BPG    History   Social History  . Marital Status: Married    Spouse Name: N/A    Number of Children: N/A  . Years of Education: N/A   Occupational History  . Not on file.   Social History Main Topics  . Smoking status: Current Everyday Smoker -- 0.5 packs/day for 42 years    Types: Cigarettes  . Smokeless tobacco: Not on file   Comment: Pt has reduced smoking to 1/4 ppd  . Alcohol Use: 10.8 oz/week    18 Cans of beer per week  . Drug Use: No  . Sexually Active:    Other Topics Concern  . Not on file   Social History Narrative  . No narrative on file    Family History  Problem Relation Age of Onset  . Heart disease Mother     Valve regurgitation and Pacemaker   . Heart disease Father     CABG x 5  . Heart disease Sister     aortic valve replacement  . Cancer Sister     Colon cancer w/ metastasis     Allergies as of 09/07/2011  . (No Known Allergies)    Current Outpatient Prescriptions on File Prior to Visit  Medication Sig Dispense Refill  . aspirin 81 MG tablet Take 81 mg by mouth daily.        Marland Kitchen HYDROcodone-acetaminophen (LORTAB 5) 5-500 MG per tablet Take 1 tablet by mouth every 6 (six) hours as needed for pain.  20 tablet  0     REVIEW OF SYSTEMS: No change from prior visit  PHYSICAL EXAMINATION:   Vital signs are BP 114/78  Pulse 61  Resp 16  Ht 5\' 8"  (1.727 m)  Wt 164 lb 11.2 oz (74.707 kg)  BMI 25.04 kg/m2  SpO2 97% General: The patient appears their stated age. HEENT:  No gross abnormalities Pulmonary:  Non labored breathing Musculoskeletal: There are no major deformities. Neurologic: No focal weakness or paresthesias are detected, Skin: There are no ulcer or rashes noted. Psychiatric: The patient has normal affect. Cardiovascular: Palpable dorsalis pedis and posterior tibial pulse on the left   Diagnostic Studies I have ordered and reviewed his ultrasound today. ABIs are 1.0  bilaterally with triphasic waveforms. The flexors bypass graft is no evidence of stenosis  Assessment: Status post left leg bypass Plan: Patient is doing very well this time. His ultrasound shows no evidence of problems. He will continue with routine graft surveillance. He'll be followed in the nurse practitioner's clinic.  Jorge Ny, M.D. [facility] Office: [phone] Pager:  [phone]

## 2011-09-21 NOTE — Procedures (Unsigned)
BYPASS GRAFT EVALUATION  INDICATION:  Followup left lower extremity bypass graft  HISTORY: Diabetes:  No Cardiac:  No Hypertension:  No Smoking:  Currently Previous Surgery:  Left above knee to below knee popliteal artery bypass graft in 09/2010  SINGLE LEVEL ARTERIAL EXAM                              RIGHT              LEFT Brachial: Anterior tibial: Posterior tibial: Peroneal: Ankle/brachial index:        1.13               1.07  PREVIOUS ABI:  Date:  RIGHT:  LEFT:  LOWER EXTREMITY BYPASS GRAFT DUPLEX EXAM:  DUPLEX:  Patent left lower extremity bypass graft without any evidence of stenosis within the graft.  IMPRESSION: 1. Patent left above knee to below knee popliteal bypass graft. 2. Patent left lower extremity native arteries present with triphasic     Doppler waveforms present. 3. Bilateral ankle brachial indices are within normal limits.  ___________________________________________ V. Charlena Cross, MD  EM/MEDQ  D:  09/07/2011  T:  09/07/2011  Job:  [id number]

## 2011-11-09 ENCOUNTER — Ambulatory Visit (INDEPENDENT_AMBULATORY_CARE_PROVIDER_SITE_OTHER): Payer: No Typology Code available for payment source | Admitting: Family Medicine

## 2011-11-09 ENCOUNTER — Encounter: Payer: Self-pay | Admitting: Family Medicine

## 2011-11-09 ENCOUNTER — Ambulatory Visit (HOSPITAL_COMMUNITY)
Admission: RE | Admit: 2011-11-09 | Discharge: 2011-11-09 | Disposition: A | Discharge status: Home / Self Care | Payer: No Typology Code available for payment source | Source: Ambulatory Visit | Attending: Family Medicine | Admitting: Family Medicine

## 2011-11-09 VITALS — BP 122/72 | HR 79 | Resp 16 | Ht 68.0 in | Wt 166.1 lb

## 2011-11-09 DIAGNOSIS — S3992XA Unspecified injury of lower back, initial encounter: Secondary | ICD-10-CM

## 2011-11-09 DIAGNOSIS — K5900 Constipation, unspecified: Secondary | ICD-10-CM

## 2011-11-09 DIAGNOSIS — M533 Sacrococcygeal disorders, not elsewhere classified: Secondary | ICD-10-CM

## 2011-11-09 DIAGNOSIS — K5909 Other constipation: Secondary | ICD-10-CM | POA: Insufficient documentation

## 2011-11-09 DIAGNOSIS — Z125 Encounter for screening for malignant neoplasm of prostate: Secondary | ICD-10-CM

## 2011-11-09 DIAGNOSIS — F17200 Nicotine dependence, unspecified, uncomplicated: Secondary | ICD-10-CM

## 2011-11-09 DIAGNOSIS — E785 Hyperlipidemia, unspecified: Secondary | ICD-10-CM

## 2011-11-09 DIAGNOSIS — Z1211 Encounter for screening for malignant neoplasm of colon: Secondary | ICD-10-CM

## 2011-11-09 DIAGNOSIS — I739 Peripheral vascular disease, unspecified: Secondary | ICD-10-CM

## 2011-11-09 DIAGNOSIS — M25559 Pain in unspecified hip: Secondary | ICD-10-CM | POA: Insufficient documentation

## 2011-11-09 LAB — POC HEMOCCULT BLD/STL (OFFICE/1-CARD/DIAGNOSTIC): Fecal Occult Blood, POC: NEGATIVE
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
## 2011-11-09 NOTE — Patient Instructions (Signed)
Get the labs done fasting Get the xrays done Try Miralax or Milk of magnesia for the stools  F/U 6 months

## 2011-11-10 ENCOUNTER — Ambulatory Visit (HOSPITAL_COMMUNITY)
Admission: RE | Admit: 2011-11-10 | Discharge: 2011-11-10 | Disposition: A | Discharge status: Home / Self Care | Payer: No Typology Code available for payment source | Source: Ambulatory Visit | Attending: Family Medicine | Admitting: Family Medicine

## 2011-11-10 DIAGNOSIS — M533 Sacrococcygeal disorders, not elsewhere classified: Secondary | ICD-10-CM | POA: Insufficient documentation

## 2011-11-10 DIAGNOSIS — S3992XA Unspecified injury of lower back, initial encounter: Secondary | ICD-10-CM

## 2011-11-11 DIAGNOSIS — M533 Sacrococcygeal disorders, not elsewhere classified: Secondary | ICD-10-CM | POA: Insufficient documentation

## 2011-11-11 DIAGNOSIS — K5900 Constipation, unspecified: Secondary | ICD-10-CM | POA: Insufficient documentation

## 2011-11-11 NOTE — Assessment & Plan Note (Signed)
Check FLP, no meds currently has PVD

## 2011-11-11 NOTE — Assessment & Plan Note (Signed)
Pt not ready to quit, discussed need for cessation

## 2011-11-11 NOTE — Assessment & Plan Note (Signed)
Multiple small BM and constipation, needs colonoscopy and GI referral he asked me to wait for now. Advised MOM or miralax, water
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
## 2011-11-12 LAB — CBC
HCT: 41.6 % (ref 39.0–52.0)
Hemoglobin: 14.2 g/dL (ref 13.0–17.0)
MCH: 31.5 pg (ref 26.0–34.0)
MCHC: 34.1 g/dL (ref 30.0–36.0)
MCV: 92.2 fL (ref 78.0–100.0)
Platelets: 218 10*3/uL (ref 150–400)
RBC: 4.51 MIL/uL (ref 4.22–5.81)
RDW: 14.2 % (ref 11.5–15.5)
WBC: 6.2 10*3/uL (ref 4.0–10.5)

## 2011-11-13 ENCOUNTER — Telehealth: Payer: Self-pay | Admitting: Family Medicine

## 2011-11-13 DIAGNOSIS — R972 Elevated prostate specific antigen [PSA]: Secondary | ICD-10-CM

## 2011-11-13 LAB — LIPID PANEL
Cholesterol: 202 mg/dL — ABNORMAL HIGH (ref 0–200)
HDL: 42 mg/dL (ref >39)
LDL Cholesterol: 137 mg/dL — ABNORMAL HIGH (ref 0–99)
Total CHOL/HDL Ratio: 4.8 Ratio
Triglycerides: 117 mg/dL (ref <150)
VLDL: 23 mg/dL (ref 0–40)

## 2011-11-13 LAB — COMPREHENSIVE METABOLIC PANEL
ALT: 13 U/L (ref 0–53)
AST: 13 U/L (ref 0–37)
Albumin: 4.2 g/dL (ref 3.5–5.2)
Alkaline Phosphatase: 63 U/L (ref 39–117)
BUN: 12 mg/dL (ref 6–23)
CO2: 29 mEq/L (ref 19–32)
Calcium: 9.4 mg/dL (ref 8.4–10.5)
Chloride: 108 mEq/L (ref 96–112)
Creat: 1 mg/dL (ref 0.50–1.35)
Glucose, Bld: 90 mg/dL (ref 70–99)
Potassium: 5.4 mEq/L — ABNORMAL HIGH (ref 3.5–5.3)
Sodium: 142 mEq/L (ref 135–145)
Total Bilirubin: 0.5 mg/dL (ref 0.3–1.2)
Total Protein: 6.2 g/dL (ref 6.0–8.3)

## 2011-11-13 LAB — PSA: PSA: 14.74 ng/mL — ABNORMAL HIGH (ref <=4.00)

## 2011-11-13 NOTE — Telephone Encounter (Signed)
Spoke with pt, given xray results, mild OA hips, neg coccyx film. PSA elevated at 14, needs referral to urology, understands evaluation for prostate cancer

## 2011-12-04 ENCOUNTER — Telehealth: Payer: Self-pay | Admitting: Family Medicine

## 2011-12-04 MED ORDER — TRAMADOL-ACETAMINOPHEN 37.5-325 MG PO TABS: 1.0000 | ORAL_TABLET | Freq: Four times a day (QID) | ORAL | Status: DC | PRN

## 2011-12-04 NOTE — Telephone Encounter (Signed)
Ultracet  sent for pain, do not take with tylenol

## 2011-12-04 NOTE — Telephone Encounter (Signed)
Patient aware.

## 2011-12-28 ENCOUNTER — Telehealth: Payer: Self-pay | Admitting: Family Medicine

## 2011-12-28 MED ORDER — TRAMADOL-ACETAMINOPHEN 37.5-325 MG PO TABS: 1.0000 | ORAL_TABLET | Freq: Four times a day (QID) | ORAL | Status: DC | PRN

## 2011-12-28 NOTE — Telephone Encounter (Signed)
Medication refilled

## 2011-12-28 NOTE — Telephone Encounter (Signed)
Patient has refills listed on ultracet.

## 2012-01-11 ENCOUNTER — Telehealth: Payer: Self-pay | Admitting: Family Medicine

## 2012-01-11 DIAGNOSIS — C61 Malignant neoplasm of prostate: Secondary | ICD-10-CM | POA: Insufficient documentation

## 2012-01-11 HISTORY — PX: PROSTATE BIOPSY: SHX241

## 2012-01-11 HISTORY — DX: Malignant neoplasm of prostate: C61

## 2012-01-12 MED ORDER — HYDROCODONE-ACETAMINOPHEN 5-500 MG PO TABS: 1.0000 | ORAL_TABLET | Freq: Four times a day (QID) | ORAL | Status: DC | PRN

## 2012-01-12 NOTE — Telephone Encounter (Addendum)
Please find out if pt has had prostate biopsy for cancer- [facility]  I will send in hydrocodone with 1 refill

## 2012-01-12 NOTE — Telephone Encounter (Signed)
He had the biopsy yesterday- no results as of yet

## 2012-01-13 NOTE — Telephone Encounter (Signed)
noted 

## 2012-01-18 ENCOUNTER — Other Ambulatory Visit (HOSPITAL_COMMUNITY): Payer: Self-pay | Admitting: Urology

## 2012-01-18 DIAGNOSIS — C61 Malignant neoplasm of prostate: Secondary | ICD-10-CM

## 2012-02-01 ENCOUNTER — Telehealth: Payer: Self-pay | Admitting: Family Medicine

## 2012-02-01 NOTE — Telephone Encounter (Signed)
Patient states his wife called in and was going to discuss it further when she came in for her appt

## 2012-02-03 ENCOUNTER — Telehealth: Payer: Self-pay

## 2012-02-04 MED ORDER — OXYCODONE-ACETAMINOPHEN 5-325 MG PO TABS: 1.0000 | ORAL_TABLET | Freq: Three times a day (TID) | ORAL | Status: DC | PRN

## 2012-02-04 NOTE — Telephone Encounter (Signed)
I spoke with pt, he has not had any staging or treatment, he does have 12 positive biopsies which I obtained this info as well from [facility] His pain is now from legs up to lower back  And vicodin is not helping He has a bone scan set for Monday  Will change in to percocet TID prn pain

## 2012-02-08 ENCOUNTER — Encounter (HOSPITAL_COMMUNITY)
Admission: RE | Admit: 2012-02-08 | Discharge: 2012-02-08 | Disposition: A | Discharge status: Home / Self Care | Payer: No Typology Code available for payment source | Source: Ambulatory Visit | Attending: Urology | Admitting: Urology

## 2012-02-08 ENCOUNTER — Encounter (HOSPITAL_COMMUNITY): Payer: Self-pay

## 2012-02-08 DIAGNOSIS — C61 Malignant neoplasm of prostate: Secondary | ICD-10-CM | POA: Insufficient documentation

## 2012-02-08 HISTORY — DX: Malignant neoplasm of prostate: C61

## 2012-02-08 MED ORDER — TECHNETIUM TC 99M MEDRONATE IV KIT
25.0000 | PACK | Freq: Once | INTRAVENOUS | Status: AC | PRN
  Administered 2012-02-08: 25 via INTRAVENOUS

## 2012-02-10 ENCOUNTER — Other Ambulatory Visit (HOSPITAL_COMMUNITY): Payer: Self-pay | Admitting: Urology

## 2012-02-10 DIAGNOSIS — C61 Malignant neoplasm of prostate: Secondary | ICD-10-CM

## 2012-02-15 ENCOUNTER — Other Ambulatory Visit: Payer: Self-pay | Admitting: Family Medicine

## 2012-02-15 ENCOUNTER — Telehealth: Payer: Self-pay | Admitting: Family Medicine

## 2012-02-15 MED ORDER — HYDROCODONE-ACETAMINOPHEN 10-325 MG PO TABS: 1.0000 | ORAL_TABLET | ORAL | Status: DC | PRN

## 2012-02-15 NOTE — Telephone Encounter (Signed)
Came to office and spoke to Dr in person

## 2012-02-15 NOTE — Progress Notes (Signed)
Patient's wife came in. He is currently under workup for prostate cancer possible metastasis. He is having severe pain. He was given oxycodone however this gave him headache and made him nauseous and sweaty therefore he went back to the hydrocodone which at that dose 5 mg/500 was not helping. He is followup with his urologist this week to determine staging of his prostate cancer. Will change him to hydrocodone 10 mg since he does tolerate this. He was given #120 tablets.

## 2012-02-22 ENCOUNTER — Ambulatory Visit (HOSPITAL_COMMUNITY)
Admission: RE | Admit: 2012-02-22 | Discharge: 2012-02-22 | Disposition: A | Discharge status: Home / Self Care | Payer: No Typology Code available for payment source | Source: Ambulatory Visit | Attending: Urology | Admitting: Urology

## 2012-02-22 DIAGNOSIS — C61 Malignant neoplasm of prostate: Secondary | ICD-10-CM | POA: Insufficient documentation

## 2012-02-22 LAB — CREATININE, SERUM
Creatinine, Ser: 0.82 mg/dL (ref 0.50–1.35)
GFR calc Af Amer: >90 mL/min (ref >90)
GFR calc non Af Amer: >90 mL/min (ref >90)

## 2012-02-22 MED ORDER — GADOBENATE DIMEGLUMINE 529 MG/ML IV SOLN
20.0000 mL | Freq: Once | INTRAVENOUS | Status: AC | PRN
  Administered 2012-02-22: 16 mL via INTRAVENOUS

## 2012-03-02 ENCOUNTER — Encounter: Payer: Self-pay | Admitting: Radiation Oncology

## 2012-03-02 NOTE — Progress Notes (Signed)
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

## 2012-03-03 ENCOUNTER — Encounter: Payer: Self-pay | Admitting: Radiation Oncology

## 2012-03-03 ENCOUNTER — Ambulatory Visit
Admission: RE | Admit: 2012-03-03 | Discharge: 2012-03-03 | Disposition: A | Discharge status: Home / Self Care | Payer: PRIVATE HEALTH INSURANCE | Source: Ambulatory Visit | Attending: Radiation Oncology | Admitting: Radiation Oncology

## 2012-03-03 VITALS — BP 132/84 | HR 70 | Temp 98.1°F | Resp 20 | Ht 68.0 in | Wt 173.7 lb

## 2012-03-03 DIAGNOSIS — C61 Malignant neoplasm of prostate: Secondary | ICD-10-CM

## 2012-03-03 DIAGNOSIS — I739 Peripheral vascular disease, unspecified: Secondary | ICD-10-CM | POA: Insufficient documentation

## 2012-03-03 DIAGNOSIS — Z8042 Family history of malignant neoplasm of prostate: Secondary | ICD-10-CM | POA: Insufficient documentation

## 2012-03-03 HISTORY — DX: Other obstructive and reflux uropathy: N40.1

## 2012-03-03 HISTORY — DX: Anxiety disorder, unspecified: F41.9

## 2012-03-03 HISTORY — DX: Other obstructive and reflux uropathy: N13.8

## 2012-03-03 HISTORY — DX: Allergy, unspecified, initial encounter: T78.40XA

## 2012-03-03 HISTORY — DX: Nocturia: R35.1

## 2012-03-03 HISTORY — DX: Male erectile dysfunction, unspecified: N52.9

## 2012-03-03 NOTE — Progress Notes (Signed)
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 male who is seen today for the courtesy of Dr. 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 He states that he first noted rectal discomfort approximately 11 months ago. This past September he sought the attention of his primary care physician, Dr. Jeanice Lim,  who obtained a PSA which was elevated at 14.74 (September 19). He was referred to Dr. Brunilda Payor for further evaluation. A repeat PSA on 12/15/2011 was 17.24. He underwent ultrasound-guided biopsies on 01/11/2012. His was found to have extensive Gleason 8 and Gleason 9 disease. He had Gleason 8 (4+4) involving 40% of one core from the right lateral base, 50% of one core from the right base, 60% of one core from right lateral apex, 70% of one core from the left lateral base, 30% of one core from the left base, 80% of one core from the left lateral mid gland, 40% of one core from the left mid gland, 90% of one core from left lateral apex and 50% of one core from left apex. He Gleason 9 (4+5) involving 20% of one core from the right lateral mid gland, 40% of one core from the right mid gland and 60% of one core from the right apex. His gland volume was approximately 15.45 cc. Dr. Brunilda Payor obtained a bone scan on December 16 and this was without evidence for metastatic disease. A prostate/pelvic MRI on 02/22/2012 showed macroscopic tumor involving the majority of the periphery of the gland with suspected gross extracapsular extension along the left posterior lateral mid gland. There is  felt to be suspected involvement of the left neurovascular bundle and seminal vesicle. He continues to have "tailbone pain" in addition to worsening of his obstructive urinary symptoms. His IP SS score is 18. He feels that his urination was much better  6 months ago. He states that he is having less frequent bowel movements then he used to. He's had erectile dysfunction for the past 5 months.  PREVIOUS RADIATION THERAPY: No   PAST MEDICAL HISTORY:  has a past medical history of SOB (shortness of breath) on exertion; PVD (peripheral vascular disease); Prostate cancer (01/11/12); Allergy; BPH with obstruction/lower urinary tract symptoms; Nocturia; ED (erectile dysfunction); Anxiety; and Arthritis.     PAST SURGICAL HISTORY:  Past Surgical History  Procedure Date  . Hernia repair     Bilateral inguinal X2  . Joint replacement     bilateral  . Nose surgery   . Knee surgery     Left Knee X 2   and Right knee X1  . Pr vein bypass graft,aorto-fem-pop 10/21/10    Left AK to BK popliteal BPG  . Prostate biopsy 01/11/2012    Adenocarcinoma     FAMILY HISTORY: family history includes Cancer in his sister and Heart disease in his father, mother, and sister. His father is alive and well at 66 and his mother is alive and well at 14, both living independently. There is a family history prostate cancer in one or 2 maternal uncles.  SOCIAL HISTORY:  reports that he has been smoking Cigarettes.  He has a 21.5 pack-year smoking history. He has never used smokeless tobacco. He reports that he does not drink alcohol or use illicit drugs. Married, 2 children, works as a Designer, industrial/product.   ALLERGIES: Codeine and Morphine and related   MEDICATIONS:  Current Outpatient Prescriptions  Medication Sig Dispense Refill  . aspirin 81 MG tablet Take 81 mg by mouth daily.        Marland Kitchen HYDROcodone-acetaminophen (NORCO) 10-325 MG per tablet Take 1 tablet by mouth every 4 (four) hours as needed for pain.  120  tablet  2     REVIEW OF SYSTEMS:  Pertinent items are noted in HPI.    PHYSICAL EXAM:  height is 5\' 8"  (1.727 m) and weight is 173 lb 11.2 oz (78.79 kg). His oral temperature is 98.1 F (36.7 C). His blood pressure is 132/84 and his pulse is 70. His respiration is 20.   Alert and oriented 62 year old white male appearing his stated age. Head and neck examination: Grossly unremarkable although there are scars along his left forehead/region. Nodes: Without palpable cervical or supraclavicular lymphadenopathy. Chest: Lungs clear. Heart: Regular in rhythm. Back: Without spinal or CVA tenderness. Abdomen: Soft without masses organomegaly. Genitalia: Grossly unremarkable to inspection. Rectal: The gland is normal size and is slightly indurated throughout but there is no discrete nodularity or definite periprostatic tumor extension. I do not feel the seminal vesicles. There is no palpable discomfort along the sacrum/coccyx. Extremities: Without edema. Neurologic examination: Grossly nonfocal   LABORATORY DATA:  Lab Results  Component Value Date   WBC 6.2 11/12/2011   HGB 14.2 11/12/2011   HCT 41.6 11/12/2011   MCV 92.2 11/12/2011   PLT 218 11/12/2011   Lab Results  Component Value Date   NA 142 11/12/2011   K 5.4* 11/12/2011   CL 108 11/12/2011   CO2 29 11/12/2011   Lab Results  Component Value Date   ALT 13 11/12/2011   AST 13 11/12/2011   ALKPHOS 63 11/12/2011   BILITOT 0.5 11/12/2011   PSA 17.24 from 12/15/2011.   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 I explained to the patient and his wife that his prognosis is related to his stage, PSA level, and Gleason score. His PSA is of intermediate favorability while his stage and Gleason score are distinctly unfavorable. We discussed management options including surgery and radiation therapy. We discussed radiation therapy options in great detail including 5 weeks of external beam followed by  seed implantation or 8 weeks of external beam/IMRT. Based on his urinary obstructive symptomatology, small gland, and possible seminal vesicle involvement, he would not be an ideal candidate for seed implant boost. He would best suited for external beam/IMRT. We discussed androgen deprivation therapy in the benefit when given with external beam/IMRT. We also discussed the potential side effects from androgen deprivation therapy including hot flashes, fatigue, and loss of sex drive. We discussed the potential acute and late toxicities of radiation therapy. We discussed the need for having 3 gold markers placed the prostate for image guidance. He has concerns about the discomfort that may result from placement of 3 gold markers, and I told him that he can discuss this with Dr. Brunilda Payor. At this point in time he is not ready to make a decision, but is leaning towards radiation therapy. If he is interested in surgery and he would almost certainly require postoperative radiation therapy which may increase  his treatment-related toxicity. He'll contact me if he wants to proceed with radiation therapy. If he wants ration therapy they'll contact Dr. Brunilda Payor to initiate androgen deprivation therapy,  schedule him for placement of 3 gold markers with Dr. Brunilda Payor, and then see him for a two-month followup visit prior to his treatment planning.  PLAN: As discussed above. I expect that the patient will make a decision by early next week.   I spent 60 minutes minutes face to face with the patient and more than 50% of that time was spent in counseling and/or coordination of care.

## 2012-03-03 NOTE — Progress Notes (Signed)
Please see the Nurse Progress Note in the MD Initial Consult Encounter for this patient. 

## 2012-03-07 ENCOUNTER — Telehealth: Payer: Self-pay | Admitting: *Deleted

## 2012-03-07 ENCOUNTER — Encounter: Payer: Self-pay | Admitting: Radiation Oncology

## 2012-03-07 NOTE — Addendum Note (Signed)
Encounter addended by: Maryln Gottron, MD on: 03/07/2012  8:36 AM<BR>     Documentation filed: Normajean Glasgow VN

## 2012-03-07 NOTE — Telephone Encounter (Signed)
Called patient to inform of appt. For gold seed placement on April 28, 2012 at 9:00 am at Dr. Brunilda Payor' Office, and [facility] appt. On 05-03-12- arrival time- 12:30 pm, spoke with patient and he is aware of these appts.

## 2012-03-07 NOTE — Progress Notes (Signed)
The patient called this morning and he wants to proceed with IMRT along with androgen deprivation therapy. I spoke with Dr. Madilyn Hook nurse, Windell Moulding, today and she'll go ahead and get him started. My staff will schedule placement of 3 gold seed markers and also the patient back for a followup visit in 2 months.

## 2012-03-14 ENCOUNTER — Ambulatory Visit: Payer: 59 | Admitting: Neurosurgery

## 2012-04-05 ENCOUNTER — Ambulatory Visit (INDEPENDENT_AMBULATORY_CARE_PROVIDER_SITE_OTHER): Payer: No Typology Code available for payment source | Admitting: Family Medicine

## 2012-04-05 ENCOUNTER — Encounter: Payer: Self-pay | Admitting: Family Medicine

## 2012-04-05 VITALS — BP 130/76 | HR 60 | Resp 18 | Ht 68.0 in | Wt 173.1 lb

## 2012-04-05 DIAGNOSIS — R630 Anorexia: Secondary | ICD-10-CM

## 2012-04-05 DIAGNOSIS — F17200 Nicotine dependence, unspecified, uncomplicated: Secondary | ICD-10-CM

## 2012-04-05 DIAGNOSIS — C61 Malignant neoplasm of prostate: Secondary | ICD-10-CM

## 2012-04-05 DIAGNOSIS — M533 Sacrococcygeal disorders, not elsewhere classified: Secondary | ICD-10-CM

## 2012-04-05 DIAGNOSIS — R110 Nausea: Secondary | ICD-10-CM

## 2012-04-05 MED ORDER — ONDANSETRON HCL 4 MG PO TABS: 4.0000 mg | ORAL_TABLET | Freq: Three times a day (TID) | ORAL | Status: DC | PRN

## 2012-04-05 NOTE — Assessment & Plan Note (Signed)
Reviewed his last oncology note, he has unfortunately a very high Gleason score, will follow along with his treatments

## 2012-04-05 NOTE — Assessment & Plan Note (Signed)
Unchanged, difficult to cut back in setting of his current cancer and treatments

## 2012-04-05 NOTE — Assessment & Plan Note (Signed)
Decreased appetite but no weight loss, multifactorial with treatments, pain and mood changes For now will see if he can add ensure or carnation for nutrients, he is only eating 1/3 of meals Zofran per above We did discuss appetite medication remeron, megace, but will hold at this time as he is maintaining his weight

## 2012-04-05 NOTE — Patient Instructions (Signed)
Try the zofran for nausea and upset stomach Try ensure  Continue pain medications Call if you need anything  F/U 4 months

## 2012-04-05 NOTE — Assessment & Plan Note (Signed)
This has conincided with starting his hormone treatment, trial of zofran for GI symptoms

## 2012-04-05 NOTE — Progress Notes (Signed)
  Subjective:    Patient ID: Cory Foley, male    DOB: [birth year], 62 y.o.   MRN: [id number]  HPI Pt here to f/u chronic medical problems, last visit  He complained of tailbone pain, PSA was elevated, evaluated by urology , he has Prostate cancer with gleason score of 9. Currently on hormonal treatments, planning for seed implant and radiation starting in March. He was having severe pain in his buttocks and back, now this has subsided and vicodin has helped. He has had decreased appetite since starting hormone medication, no weight loss, + hot flashes and mood swings. When he forces himself to eat has upset stomach and nausea, no emesis, no diarrhea.    Review of Systems  GEN- denies fatigue, fever, weight loss,weakness, recent illness HEENT- denies eye drainage, change in vision, nasal discharge, CVS- denies chest pain, palpitations RESP- denies SOB, cough, wheeze ABD- denies N/V, change in stools, abd pain GU- denies dysuria, hematuria, dribbling, incontinence MSK- + joint pain, muscle aches, injury Neuro- denies headache, dizziness, syncope, seizure activity      Objective:   Physical Exam GEN- NAD, alert and oriented x3 HEENT- PERRL, EOMI, non injected sclera, pink conjunctiva, MMM, oropharynx clear Neck- Supple, no LAD CVS- RRR, no murmur RESP-CTAB ABD-NABS,soft,NT,ND EXT- No edema Pulses- Radial, DP- 2+        Assessment & Plan:

## 2012-04-05 NOTE — Assessment & Plan Note (Signed)
Significant pain associated with his prostate cancer, continue vicodin which is working well, percocet caused headache

## 2012-04-19 ENCOUNTER — Telehealth: Payer: Self-pay | Admitting: Radiation Oncology

## 2012-04-19 NOTE — Telephone Encounter (Signed)
Cory Foley sent fax for consult note.   Faxing consult not today to: [phone]  Ph: [phone]   COPY SCANNED

## 2012-04-22 ENCOUNTER — Encounter: Payer: Self-pay | Admitting: Neurosurgery

## 2012-04-25 ENCOUNTER — Ambulatory Visit: Payer: Self-pay | Admitting: Neurosurgery

## 2012-04-28 HISTORY — PX: OTHER SURGICAL HISTORY: SHX169

## 2012-05-02 ENCOUNTER — Encounter: Payer: Self-pay | Admitting: Oncology

## 2012-05-03 ENCOUNTER — Encounter: Payer: Self-pay | Admitting: Neurosurgery

## 2012-05-03 ENCOUNTER — Other Ambulatory Visit: Payer: Self-pay | Admitting: Family Medicine

## 2012-05-03 ENCOUNTER — Ambulatory Visit
Admission: RE | Admit: 2012-05-03 | Discharge: 2012-05-03 | Disposition: A | Discharge status: Home / Self Care | Payer: No Typology Code available for payment source | Source: Ambulatory Visit | Attending: Radiation Oncology | Admitting: Radiation Oncology

## 2012-05-03 VITALS — BP 112/77 | HR 63 | Temp 98.4°F | Ht 68.0 in | Wt 172.3 lb

## 2012-05-03 DIAGNOSIS — R1013 Epigastric pain: Secondary | ICD-10-CM | POA: Insufficient documentation

## 2012-05-03 DIAGNOSIS — C61 Malignant neoplasm of prostate: Secondary | ICD-10-CM

## 2012-05-03 MED ORDER — RANITIDINE HCL 150 MG PO TABS: 150.0000 mg | ORAL_TABLET | Freq: Every day | ORAL | Status: DC

## 2012-05-03 NOTE — Progress Notes (Signed)
CC: Dr. Su Grand, Dr. Milinda Antis  Followup note:  Diagnosis clinical stage TI C. 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 I saw him in consultation on 03/03/2012 at which time he presented with Gleason 9 disease and elevated PSA of 17.24. Staging MRI showed microscopic tumor involving the majority of the periphery of the gland with suspected gross extracapsular extension along the left posterior lateral mid gland and left neurovascular bundle and seminal vesicle. His gland volume was 15.45 cc. His staging workup including a bone scan and MRI scan were without evidence for metastatic disease to bone. He is quite symptomatic with "tailbone pain". He was started on androgen deprivation therapy in mid January and his "tailbone pain" went away within one month. He's been having periodic epigastric discomfort and he saw Dr. Jeanice Lim who started him on ondansetron with no change in his symptomatology. He tells me he drinks 3-4 pots of coffee a day and was advised to cut back on his coffee by Dr. Jeanice Lim. He has not done so. He does report hot flashes and fatigue. 3 gold seed markers were placed on March 6 by Dr. Brunilda Payor. His I PSS score I saw him in January was 18. His I PSS score today is 20.  Physical examination: Alert and oriented 14 62 year old white male appearing his stated age. Wt Readings from Last 3 Encounters:  05/03/12 172 lb 4.8 oz (78.155 kg)  04/05/12 173 lb 1.3 oz (78.509 kg)  03/03/12 173 lb 11.2 oz (78.79 kg)   Temp Readings from Last 3 Encounters:  05/03/12 98.4 F (36.9 C)   03/03/12 98.1 F (36.7 C) Oral  11/24/10 98.5 F (36.9 C) Oral   BP Readings from Last 3 Encounters:  05/03/12 112/77  04/05/12 130/76  03/03/12 132/84   Pulse Readings from Last 3 Encounters:  05/03/12 63  04/05/12 60  03/03/12 70   Abdomen: Without masses organomegaly. Discomfort is described in the  epigastric region. Rectal: Prostate gland is small and is without focal induration or nodularity.  Impression: Clinical stage TI C. 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 He presented extracapsular extension along with seminal vesicle involvement. Based on his Gleason 9 an elevated PSA he is at significant risk for nodal involvement as well. We again discussed his management options and he wishes to proceed with external beam/IMRT. We again discussed the potential acute and late toxicities of radiation therapy. I plan would be to treat his prostate and seminal vesicles and also his pelvic lymph nodes. Of note is that his "tailbone pain" is improved, and he may have had referred pain related to his extracapsular extension. I went back and reviewed his MRI scan and bone scan and there was no evidence for metastatic disease to bone. Consent is signed today. I encouraged him to cut back on his coffee intake, suspect that he may have some degree of gastritis. He may want to consider an H2 blocker. I'll have him return for simulation/treatment planning on March 31 and begin his radiation therapy in early April.  Plan: As discussed above. I encouraged him to see Dr. Jeanice Lim if his epigastric discomfort persists.  30 minutes was spent face-to-face with the patient, primarily counseling the patient and coordinating his care.

## 2012-05-03 NOTE — Addendum Note (Signed)
Encounter addended by: Eduardo Osier, RN on: 05/03/2012  3:02 PM<BR>     Documentation filed: Charges VN

## 2012-05-03 NOTE — Progress Notes (Signed)
Mr. Milnes here accompanied by his wife for follow up new appointment.  He denies pain at this time. He does have urinary frequency and needs to get up about 3 times a night to urinate.  He denies hematuria.  He also states that he has a weak urinary stream mainly in the mornings.

## 2012-05-04 ENCOUNTER — Ambulatory Visit (INDEPENDENT_AMBULATORY_CARE_PROVIDER_SITE_OTHER): Payer: No Typology Code available for payment source | Admitting: Vascular Surgery

## 2012-05-04 ENCOUNTER — Encounter (INDEPENDENT_AMBULATORY_CARE_PROVIDER_SITE_OTHER): Payer: No Typology Code available for payment source | Admitting: Vascular Surgery

## 2012-05-04 ENCOUNTER — Telehealth: Payer: Self-pay

## 2012-05-04 ENCOUNTER — Ambulatory Visit: Payer: Self-pay | Admitting: Neurosurgery

## 2012-05-04 DIAGNOSIS — Z48812 Encounter for surgical aftercare following surgery on the circulatory system: Secondary | ICD-10-CM

## 2012-05-04 DIAGNOSIS — I739 Peripheral vascular disease, unspecified: Secondary | ICD-10-CM

## 2012-05-04 MED ORDER — DIAZEPAM 5 MG PO TABS: 5.0000 mg | ORAL_TABLET | Freq: Three times a day (TID) | ORAL | Status: DC | PRN

## 2012-05-04 NOTE — Progress Notes (Signed)
Left lower extremity arterial duplex performed @ [facility] 05/04/2012

## 2012-05-04 NOTE — Progress Notes (Signed)
Called and left message for patient.

## 2012-05-04 NOTE — Telephone Encounter (Signed)
Wife said Gustabo is going in to have a procedure done by Dr Dayton Scrape on Mar 24 and he is going to have to have this done every Monday for 8 weeks. They are going to have to go in through his penis and he is very nervous and wants to see if he could have 1 valium to take beforehand. Wants it sent to [place]. Said Dr Nettie Elm office acts like its nothing to worry about but he said he's not going to do it without something to calm his nerves.

## 2012-05-04 NOTE — Telephone Encounter (Signed)
You may print script for Valium 5mg , take 1 tablet 1 hour prior to procedure, dispense 2 tablets, Refill 1

## 2012-05-04 NOTE — Telephone Encounter (Signed)
Script ptinted

## 2012-05-05 ENCOUNTER — Telehealth: Payer: Self-pay | Admitting: Family Medicine

## 2012-05-05 ENCOUNTER — Other Ambulatory Visit: Payer: Self-pay

## 2012-05-05 MED ORDER — DIAZEPAM 5 MG PO TABS: ORAL_TABLET | ORAL | Status: DC

## 2012-05-05 NOTE — Telephone Encounter (Signed)
Patient aware and med sent in  

## 2012-05-09 ENCOUNTER — Other Ambulatory Visit: Payer: Self-pay

## 2012-05-09 DIAGNOSIS — I739 Peripheral vascular disease, unspecified: Secondary | ICD-10-CM

## 2012-05-09 DIAGNOSIS — Z48812 Encounter for surgical aftercare following surgery on the circulatory system: Secondary | ICD-10-CM

## 2012-05-10 ENCOUNTER — Encounter: Payer: Self-pay | Admitting: Surgery

## 2012-05-16 ENCOUNTER — Ambulatory Visit
Admission: RE | Admit: 2012-05-16 | Discharge: 2012-05-16 | Disposition: A | Discharge status: Home / Self Care | Payer: PRIVATE HEALTH INSURANCE | Source: Ambulatory Visit | Attending: Radiation Oncology | Admitting: Radiation Oncology

## 2012-05-16 DIAGNOSIS — R3915 Urgency of urination: Secondary | ICD-10-CM | POA: Insufficient documentation

## 2012-05-16 DIAGNOSIS — R197 Diarrhea, unspecified: Secondary | ICD-10-CM | POA: Insufficient documentation

## 2012-05-16 DIAGNOSIS — K5900 Constipation, unspecified: Secondary | ICD-10-CM | POA: Insufficient documentation

## 2012-05-16 DIAGNOSIS — R350 Frequency of micturition: Secondary | ICD-10-CM | POA: Insufficient documentation

## 2012-05-16 DIAGNOSIS — R1013 Epigastric pain: Secondary | ICD-10-CM | POA: Insufficient documentation

## 2012-05-16 DIAGNOSIS — Z510 Encounter for antineoplastic radiation therapy: Secondary | ICD-10-CM | POA: Insufficient documentation

## 2012-05-16 DIAGNOSIS — C61 Malignant neoplasm of prostate: Secondary | ICD-10-CM | POA: Insufficient documentation

## 2012-05-16 NOTE — Progress Notes (Signed)
Complex simulation/treatment planning note: The patient was taken to the CT simulator. A custom vac lock immobilization device was constructed. A red rubber catheter was placed within the rectal vault. He was in catheterized and contrast instilled into his bladder and urethra. He was then scanned. I contoured his prostate, seminal vesicles, rectum, bladder and rectosigmoid colon. In addition I contoured his lymph node PTV 56 (pelvic lymph nodes). I'm prescribing 7800 cGy to his prostate PTV which represents the prostate was 0.8 cm except for 0.5 cm along the rectum. I prescribing 5600 cGy in 40 sessions to his seminal vesicles PTV which are presents his seminal vesicles posterior 0.5 cm. Lastly, the pelvic lymph nodes received 5600 cGy in 40 sessions. He is now ready for IMRT simulation/treatment planning.

## 2012-05-23 ENCOUNTER — Encounter: Payer: Self-pay | Admitting: Radiation Oncology

## 2012-05-23 NOTE — Progress Notes (Signed)
IMRT simulation/treatment planning note: The patient completed IMRT simulation/treatment planning in the management of his carcinoma the prostate. IMRT was chosen to decrease the risk for both acute and late bladder and rectal toxicity compared to 3-D conformal or conventional radiation therapy. Dose volume histograms were obtained for the target structures including the prostate, seminal vesicles, and pelvic lymph nodes. We met our departmental guidelines with respect to target coverage and also avoidance structures including the rectum and bladder. I prescribing 7800 cGy to his prostate PTV which represents the prostate was 0.8 cm except for 0.5 cm along the rectum. I prescribing 5600 cGy in 40 sessions to his seminal vesicles and also pelvic lymph nodes. I requested he undergo daily MV CT setting up to his 3 gold seeds for image guidance. He is be treated with a comfortably full bladder.

## 2012-05-25 ENCOUNTER — Ambulatory Visit
Admission: RE | Admit: 2012-05-25 | Discharge: 2012-05-25 | Disposition: A | Discharge status: Home / Self Care | Payer: PRIVATE HEALTH INSURANCE | Source: Ambulatory Visit | Attending: Radiation Oncology | Admitting: Radiation Oncology

## 2012-05-25 ENCOUNTER — Encounter: Payer: Self-pay | Admitting: Radiation Oncology

## 2012-05-25 NOTE — Progress Notes (Signed)
Chart note: The patient underwent Tomotherapy segmentation in the management of his carcinoma the prostate. He is being treated to 7.9 delivered field widths corresponding to one set of IMRT treatment devices [phone]).

## 2012-05-26 ENCOUNTER — Ambulatory Visit
Admission: RE | Admit: 2012-05-26 | Discharge: 2012-05-26 | Disposition: A | Discharge status: Home / Self Care | Payer: PRIVATE HEALTH INSURANCE | Source: Ambulatory Visit | Attending: Radiation Oncology | Admitting: Radiation Oncology

## 2012-05-27 ENCOUNTER — Ambulatory Visit
Admission: RE | Admit: 2012-05-27 | Discharge: 2012-05-27 | Disposition: A | Discharge status: Home / Self Care | Payer: PRIVATE HEALTH INSURANCE | Source: Ambulatory Visit | Attending: Radiation Oncology | Admitting: Radiation Oncology

## 2012-05-27 VITALS — Ht 68.0 in | Wt 173.7 lb

## 2012-05-27 DIAGNOSIS — C61 Malignant neoplasm of prostate: Secondary | ICD-10-CM

## 2012-05-27 NOTE — Progress Notes (Signed)
Cory Foley came to nursing for post sim education.  He was given the Radiation Therapy and You booklet and was educated on the possible side effects of radiation treatment including fatigue, diarrhea, skin changes and bladder changes.  He was also instructed to follow a high protein diet.  He stated that he has not had much of an appetite lately and has been trying to drink an ensure each day.  Told patient that Cory Foley, dietician can help if he would like to talk to her.  Cory Foley said he would lwait and talk with her latter if he needed to.  He was instructed to contact nursing with any questions or concerns.

## 2012-05-30 ENCOUNTER — Encounter: Payer: Self-pay | Admitting: Radiation Oncology

## 2012-05-30 ENCOUNTER — Ambulatory Visit
Admission: RE | Admit: 2012-05-30 | Discharge: 2012-05-30 | Disposition: A | Discharge status: Home / Self Care | Payer: PRIVATE HEALTH INSURANCE | Source: Ambulatory Visit | Attending: Radiation Oncology | Admitting: Radiation Oncology

## 2012-05-30 VITALS — BP 105/68 | HR 73 | Temp 98.6°F | Ht 68.0 in | Wt 172.6 lb

## 2012-05-30 DIAGNOSIS — C61 Malignant neoplasm of prostate: Secondary | ICD-10-CM

## 2012-05-30 NOTE — Progress Notes (Signed)
Mr. Bodie has received 4 fractions to the pelvis.  Currently, no voiced concerns.

## 2012-05-30 NOTE — Progress Notes (Signed)
Weekly Management Note:  Site: Prostate/pelvic lymph nodes Current Dose:  780  cGy Projected Dose: 7800  cGy  Narrative: The patient is seen today for routine under treatment assessment. CBCT/MVCT images/port films were reviewed. The chart was reviewed.   Satisfactory bladder filling. No GU or GI difficulties  Physical Examination:  Filed Vitals:   05/30/12 1646  BP: 105/68  Pulse: 73  Temp: 98.6 F (37 C)  .  Weight: 172 lb 9.6 oz (78.291 kg). No change.  Impression: Tolerating radiation therapy well.  Plan: Continue radiation therapy as planned.

## 2012-05-31 ENCOUNTER — Ambulatory Visit
Admission: RE | Admit: 2012-05-31 | Discharge: 2012-05-31 | Disposition: A | Discharge status: Home / Self Care | Payer: PRIVATE HEALTH INSURANCE | Source: Ambulatory Visit | Attending: Radiation Oncology | Admitting: Radiation Oncology

## 2012-06-01 ENCOUNTER — Ambulatory Visit
Admission: RE | Admit: 2012-06-01 | Discharge: 2012-06-01 | Disposition: A | Discharge status: Home / Self Care | Payer: PRIVATE HEALTH INSURANCE | Source: Ambulatory Visit | Attending: Radiation Oncology | Admitting: Radiation Oncology

## 2012-06-02 ENCOUNTER — Ambulatory Visit
Admission: RE | Admit: 2012-06-02 | Discharge: 2012-06-02 | Disposition: A | Discharge status: Home / Self Care | Payer: PRIVATE HEALTH INSURANCE | Source: Ambulatory Visit | Attending: Radiation Oncology | Admitting: Radiation Oncology

## 2012-06-03 ENCOUNTER — Ambulatory Visit
Admission: RE | Admit: 2012-06-03 | Discharge: 2012-06-03 | Disposition: A | Discharge status: Home / Self Care | Payer: PRIVATE HEALTH INSURANCE | Source: Ambulatory Visit | Attending: Radiation Oncology | Admitting: Radiation Oncology

## 2012-06-06 ENCOUNTER — Ambulatory Visit
Admission: RE | Admit: 2012-06-06 | Discharge: 2012-06-06 | Disposition: A | Discharge status: Home / Self Care | Payer: PRIVATE HEALTH INSURANCE | Source: Ambulatory Visit | Attending: Radiation Oncology | Admitting: Radiation Oncology

## 2012-06-06 VITALS — BP 112/76 | HR 72 | Temp 98.9°F | Ht 68.0 in | Wt 171.9 lb

## 2012-06-06 DIAGNOSIS — C61 Malignant neoplasm of prostate: Secondary | ICD-10-CM

## 2012-06-06 NOTE — Progress Notes (Signed)
Cory Foley here for weekly put visit with his wife.  He does have a headache that he is rating at a 6/10 due to not drinking coffee.  He also states he has pain in his abdomen that he is rating at a 5/10.  He states he had this before treatment started.  He states that he is having problems with frequency.  If he drinks anything at all he has to go right away.  Also he is getting up every 20 minutes during the night and is going small amounts.  He is very fatigued.

## 2012-06-06 NOTE — Progress Notes (Signed)
Weekly Management Note:  Site:prostate/ pelvic lymph nodes Current Dose:   1755  cGy Projected Dose: 7800  cGy  Narrative: The patient is seen today for routine under treatment assessment. CBCT/MVCT images/port films were reviewed. The chart was reviewed.   Bladder filling is less than 50%/suboptimal today. He is having significant urinary frequency/urgency but no dysuria. His pretreatment I PSS score was 20. He tells me that his stream is very good.No GI difficulties.  Physical Examination:  Filed Vitals:   06/06/12 1616  BP: 112/76  Pulse: 72  Temp: 98.9 F (37.2 C)  .  Weight: 171 lb 14.4 oz (77.973 kg). No change.  Impression: Tolerating radiation therapy well, however, he is having significant frequency and urgency which I believe is more irritative based on a low volume bladder volume seen on his cone beam CT with a good urinary stream. I just spoke with Dr. Brunilda Payor and he will leave some samples of antispasmodic medication for him tomorrow and also check a urine just to make sure that he does not have an infection. I will contact the patient and have him stop by [facility] tomorrow.I defer to Dr. Brunilda Payor to see if he needs a formal urodynamics.  Plan: Continue radiation therapy as planned.

## 2012-06-07 ENCOUNTER — Ambulatory Visit
Admission: RE | Admit: 2012-06-07 | Discharge: 2012-06-07 | Disposition: A | Discharge status: Home / Self Care | Payer: PRIVATE HEALTH INSURANCE | Source: Ambulatory Visit | Attending: Radiation Oncology | Admitting: Radiation Oncology

## 2012-06-08 ENCOUNTER — Ambulatory Visit
Admission: RE | Admit: 2012-06-08 | Discharge: 2012-06-08 | Disposition: A | Discharge status: Home / Self Care | Payer: PRIVATE HEALTH INSURANCE | Source: Ambulatory Visit | Attending: Radiation Oncology | Admitting: Radiation Oncology

## 2012-06-09 ENCOUNTER — Ambulatory Visit
Admission: RE | Admit: 2012-06-09 | Discharge: 2012-06-09 | Disposition: A | Discharge status: Home / Self Care | Payer: PRIVATE HEALTH INSURANCE | Source: Ambulatory Visit | Attending: Radiation Oncology | Admitting: Radiation Oncology

## 2012-06-10 ENCOUNTER — Ambulatory Visit
Admission: RE | Admit: 2012-06-10 | Discharge: 2012-06-10 | Disposition: A | Discharge status: Home / Self Care | Payer: PRIVATE HEALTH INSURANCE | Source: Ambulatory Visit | Attending: Radiation Oncology | Admitting: Radiation Oncology

## 2012-06-13 ENCOUNTER — Encounter: Payer: Self-pay | Admitting: Oncology

## 2012-06-13 ENCOUNTER — Ambulatory Visit
Admission: RE | Admit: 2012-06-13 | Discharge: 2012-06-13 | Disposition: A | Discharge status: Home / Self Care | Payer: PRIVATE HEALTH INSURANCE | Source: Ambulatory Visit | Attending: Radiation Oncology | Admitting: Radiation Oncology

## 2012-06-13 VITALS — BP 97/67 | HR 73 | Temp 98.4°F | Ht 68.0 in | Wt 172.7 lb

## 2012-06-13 DIAGNOSIS — C61 Malignant neoplasm of prostate: Secondary | ICD-10-CM

## 2012-06-13 NOTE — Progress Notes (Signed)
Weekly Management Note:  Site: Prostate/pelvic lymph nodes Current Dose:  2730  cGy Projected Dose: 7800  cGy  Narrative: The patient is seen today for routine under treatment assessment. CBCT/MVCT images/port films were reviewed. The chart was reviewed.   Bladder filling is unsatisfactory. He still has urinary urgency/frequency. He was seen by Dr. Brunilda Payor to placed him on "samples", we are not sure if he is on an anti-spasmodic. The patient will call me later today with the name of his medication. He does not feel the medication works very well. No GI difficulties.  Physical Examination:  Filed Vitals:   06/13/12 1621  BP: 97/67  Pulse: 73  Temp: 98.4 F (36.9 C)  .  Weight: 172 lb 11.2 oz (78.336 kg). No change.  Impression: Tolerating radiation therapy well, however, he is having urinary urgency/frequency which I presume to be secondary to a hyperactive bladder. I'll check on his medication given to him by Dr. Brunilda Payor.  Plan: Continue radiation therapy as planned.

## 2012-06-13 NOTE — Progress Notes (Signed)
Cory Foley here for weekly under treatment visit.  He has had 14/40 fractions to his prostate.  He does have occasional pain in his abdomen that he rates at a 4/10.  He states that it is a gnawing pain that happens mostly in the late afternoons after treatment.  He says he takes a norco and it goes away.  He also is having more urinary frequency.  Dr. Brunilda Payor gave him a sample of proscar (14 tablets), which has helped with the frequency. He is wondering if he can get a refill for them.

## 2012-06-14 ENCOUNTER — Ambulatory Visit
Admission: RE | Admit: 2012-06-14 | Discharge: 2012-06-14 | Disposition: A | Discharge status: Home / Self Care | Payer: PRIVATE HEALTH INSURANCE | Source: Ambulatory Visit | Attending: Radiation Oncology | Admitting: Radiation Oncology

## 2012-06-15 ENCOUNTER — Ambulatory Visit
Admission: RE | Admit: 2012-06-15 | Discharge: 2012-06-15 | Disposition: A | Discharge status: Home / Self Care | Payer: PRIVATE HEALTH INSURANCE | Source: Ambulatory Visit | Attending: Radiation Oncology | Admitting: Radiation Oncology

## 2012-06-15 ENCOUNTER — Encounter: Payer: Self-pay | Admitting: Radiation Oncology

## 2012-06-15 NOTE — Progress Notes (Signed)
The patient left a message tell me that he was started on Enablex.

## 2012-06-16 ENCOUNTER — Ambulatory Visit
Admission: RE | Admit: 2012-06-16 | Discharge: 2012-06-16 | Disposition: A | Discharge status: Home / Self Care | Payer: PRIVATE HEALTH INSURANCE | Source: Ambulatory Visit | Attending: Radiation Oncology | Admitting: Radiation Oncology

## 2012-06-17 ENCOUNTER — Ambulatory Visit
Admission: RE | Admit: 2012-06-17 | Discharge: 2012-06-17 | Disposition: A | Discharge status: Home / Self Care | Payer: PRIVATE HEALTH INSURANCE | Source: Ambulatory Visit | Attending: Radiation Oncology | Admitting: Radiation Oncology

## 2012-06-20 ENCOUNTER — Ambulatory Visit
Admission: RE | Admit: 2012-06-20 | Discharge: 2012-06-20 | Disposition: A | Discharge status: Home / Self Care | Payer: PRIVATE HEALTH INSURANCE | Source: Ambulatory Visit | Attending: Radiation Oncology | Admitting: Radiation Oncology

## 2012-06-20 ENCOUNTER — Ambulatory Visit: Payer: PRIVATE HEALTH INSURANCE

## 2012-06-20 VITALS — BP 118/78 | HR 73 | Temp 98.5°F | Ht 68.0 in | Wt 172.2 lb

## 2012-06-20 DIAGNOSIS — C61 Malignant neoplasm of prostate: Secondary | ICD-10-CM

## 2012-06-20 NOTE — Progress Notes (Signed)
Cory Foley here for weekly under treat visit.  He has had 18/40 fractions to his prostate.  He is having pain in his abdomen that he is rating at a 8/10.  He says it is "like a constant toothache."  He is taking Norco for the pain.  He also quit taking the Enablex last Thursday because it was giving him headaches and his blood pressure was 196/92.  He states he is having urinary frequency but thinks it may be improving.  He also is getting up every 30 minutes during the night and is urinating small amounts.  He denies hematuria and diarrhea.  He is having fatigue and is finding it hard to work.

## 2012-06-20 NOTE — Progress Notes (Signed)
Weekly Management Note:  Site: Prostate/pelvic lymph nodes Current Dose:  3510  cGy Projected Dose: 7800  cGy  Narrative: The patient is seen today for routine under treatment assessment. CBCT/MVCT images/port films were reviewed. The chart was reviewed.   Bladder filling is excellent today. He was started on Enablex samples by Dr. Brunilda Payor last week, but this caused an elevated blood pressure and headaches along with dizziness. He did not see much benefit and he discontinued the medication. He still has urinary frequency which is worse in the evening. He also has occasional constipation. He is now all caffeinated beverages. He continues to have epigastric pain for which he takes Norco. This predated his radiation therapy.  Physical Examination:  Filed Vitals:   06/20/12 1738  BP: 118/78  Pulse: 73  Temp: 98.5 F (36.9 C)  .  Weight: 172 lb 3.2 oz (78.109 kg). No change.  Impression: Tolerating radiation therapy well. I'm pleased with his bladder filling. I suspect that some of his symptoms may be related to the use of caffeinated beverages. With his current bladder filling we will try to hold off on any antispasmodic medication for his bladder. He is to see his primary care physician, Dr. Jeanice Lim, regarding his worsening epigastric/abdominal pain.  Plan: Continue radiation therapy as planned.

## 2012-06-21 ENCOUNTER — Ambulatory Visit
Admission: RE | Admit: 2012-06-21 | Discharge: 2012-06-21 | Disposition: A | Discharge status: Home / Self Care | Payer: PRIVATE HEALTH INSURANCE | Source: Ambulatory Visit | Attending: Radiation Oncology | Admitting: Radiation Oncology

## 2012-06-21 ENCOUNTER — Ambulatory Visit: Payer: PRIVATE HEALTH INSURANCE

## 2012-06-22 ENCOUNTER — Ambulatory Visit
Admission: RE | Admit: 2012-06-22 | Discharge: 2012-06-22 | Disposition: A | Discharge status: Home / Self Care | Payer: PRIVATE HEALTH INSURANCE | Source: Ambulatory Visit | Attending: Radiation Oncology | Admitting: Radiation Oncology

## 2012-06-23 ENCOUNTER — Ambulatory Visit
Admission: RE | Admit: 2012-06-23 | Discharge: 2012-06-23 | Disposition: A | Discharge status: Home / Self Care | Payer: PRIVATE HEALTH INSURANCE | Source: Ambulatory Visit | Attending: Radiation Oncology | Admitting: Radiation Oncology

## 2012-06-23 ENCOUNTER — Ambulatory Visit: Payer: PRIVATE HEALTH INSURANCE

## 2012-06-24 ENCOUNTER — Ambulatory Visit
Admission: RE | Admit: 2012-06-24 | Discharge: 2012-06-24 | Disposition: A | Discharge status: Home / Self Care | Payer: PRIVATE HEALTH INSURANCE | Source: Ambulatory Visit | Attending: Radiation Oncology | Admitting: Radiation Oncology

## 2012-06-27 ENCOUNTER — Ambulatory Visit (INDEPENDENT_AMBULATORY_CARE_PROVIDER_SITE_OTHER): Payer: PRIVATE HEALTH INSURANCE | Admitting: Family Medicine

## 2012-06-27 ENCOUNTER — Ambulatory Visit
Admission: RE | Admit: 2012-06-27 | Discharge: 2012-06-27 | Disposition: A | Discharge status: Home / Self Care | Payer: PRIVATE HEALTH INSURANCE | Source: Ambulatory Visit | Attending: Radiation Oncology | Admitting: Radiation Oncology

## 2012-06-27 ENCOUNTER — Other Ambulatory Visit: Payer: Self-pay | Admitting: Radiation Oncology

## 2012-06-27 ENCOUNTER — Encounter: Payer: Self-pay | Admitting: Family Medicine

## 2012-06-27 VITALS — BP 126/79 | HR 60 | Temp 98.1°F | Wt 168.3 lb

## 2012-06-27 VITALS — BP 132/80 | HR 70 | Resp 18 | Ht 68.0 in | Wt 170.0 lb

## 2012-06-27 DIAGNOSIS — R1013 Epigastric pain: Secondary | ICD-10-CM

## 2012-06-27 DIAGNOSIS — C61 Malignant neoplasm of prostate: Secondary | ICD-10-CM

## 2012-06-27 MED ORDER — HYDROCODONE-ACETAMINOPHEN 10-325 MG PO TABS: 1.0000 | ORAL_TABLET | ORAL | Status: DC | PRN

## 2012-06-27 MED ORDER — OMEPRAZOLE 20 MG PO CPDR: 20.0000 mg | DELAYED_RELEASE_CAPSULE | Freq: Every day | ORAL | Status: DC

## 2012-06-27 MED ORDER — OXYBUTYNIN CHLORIDE ER 10 MG PO TB24: 10.0000 mg | ORAL_TABLET | Freq: Every day | ORAL | Status: DC

## 2012-06-27 NOTE — Progress Notes (Signed)
Weekly Management Note:  Site: Prostate/pelvic lymph nodes Current Dose:  4680  cGy Projected Dose: 7800  cGy  Narrative: The patient is seen today for routine under treatment assessment. CBCT/MVCT images/port films were reviewed. The chart was reviewed.   Bladder filling is suboptimal today. He continues to have urinary frequency/urgency. 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 His bowels are slightly loose, but is not having any diarrhea. He was seen by his primary care physician, Dr. Jeanice Lim, earlier today and was placed on Prilosec for his epigastric discomfort. He is off all caffeine. His I PSS score in early March was 20. I suspect he had irritative symptoms back then rather than obstructive symptoms since he has never been able to fill his bladder with any consistency.  Physical Examination:  Filed Vitals:   06/27/12 1627  BP: 126/79  Pulse: 60  Temp: 98.1 F (36.7 C)  .  Weight: 168 lb 4.8 oz (76.34 kg). No change.  Impression: Tolerating radiation therapy well, except for bladder irritability. I will start him on Ditropan XL .  Plan: Continue radiation therapy as planned.

## 2012-06-27 NOTE — Patient Instructions (Signed)
Take PPI once a day 30 minutes before a meal  If this does not help CT of abdomen/Pelvis to be done Pain medication refilled F/U as previous

## 2012-06-27 NOTE — Progress Notes (Signed)
Patient for weekly assessment of radiation to prostate nodes.Frequency of urination.Urine clear except for one episode of small amount of discolored incontinent urine on yesterday.Seen by PCP today for abdominal pain.To start prilosec daily for two weeks and if no relief testing to be done.Generlaized fatigue.Bowels twice daily but mildly constipated.

## 2012-06-27 NOTE — Progress Notes (Signed)
  Subjective:    Patient ID: Cory Foley, male    DOB: 1950/04/25, 62 y.o.   MRN: [id number]  HPI  Patient here with abdominal pain for the past 3 months. He describes this as more of a dull toothache in the center of his stomach it does not radiate. Denies any nausea vomiting. He's had change in his bowels due to  his radiation therapy he also has incontinence or weakness of his bladder due to the therapy. He has had some constipation. He has pain throughout the day he is unable to eat unless he takes a pain medication. He cannot remember if he try the Zantac I sent over in March. His weight has been steady  Review of Systems   GEN- denies fatigue, fever, weight loss,weakness, recent illness HEENT- denies eye drainage, change in vision, nasal discharge, CVS- denies chest pain, palpitations RESP- denies SOB, cough, wheeze ABD- denies N/V, +change in stools, +abd pain GU- denies dysuria, hematuria, dribbling, +incontinence MSK- denies joint pain, muscle aches, injury Neuro- denies headache, dizziness, syncope, seizure activity      Objective:   Physical Exam GEN- NAD, alert and oriented x3 HEENT- PERRL, EOMI, non injected sclera, pink conjunctiva, MMM, oropharynx clear CVS- RRR, no murmur RESP-CTAB ABD-NABS,soft, mild TTP epigastric region, no HSM, no rebound, normal tympany, no fluid wave EXT- No edema Pulses- Radial, DP- 2+        Assessment & Plan:

## 2012-06-28 ENCOUNTER — Ambulatory Visit
Admission: RE | Admit: 2012-06-28 | Discharge: 2012-06-28 | Disposition: A | Discharge status: Home / Self Care | Payer: PRIVATE HEALTH INSURANCE | Source: Ambulatory Visit | Attending: Radiation Oncology | Admitting: Radiation Oncology

## 2012-06-28 ENCOUNTER — Telehealth: Payer: Self-pay | Admitting: Radiation Oncology

## 2012-06-28 NOTE — Telephone Encounter (Signed)
Per RJM, Faxed disability forms to [facility], [phone].  Received confirmation.

## 2012-06-29 ENCOUNTER — Ambulatory Visit
Admission: RE | Admit: 2012-06-29 | Discharge: 2012-06-29 | Disposition: A | Discharge status: Home / Self Care | Payer: PRIVATE HEALTH INSURANCE | Source: Ambulatory Visit | Attending: Radiation Oncology | Admitting: Radiation Oncology

## 2012-06-29 DIAGNOSIS — R1013 Epigastric pain: Secondary | ICD-10-CM | POA: Insufficient documentation

## 2012-06-29 NOTE — Assessment & Plan Note (Addendum)
Will treat for GERD/Gastritis, avoid NSAIDS. Start PPI , if no improvement, obtain CT/Abd pelvis and decide on referral to GI for possible upper endoscopy if no source for pain can be found Pt agrees to plan as he has had multiple interventions and wants to wait on imaging Wife also at bedside

## 2012-06-30 ENCOUNTER — Ambulatory Visit
Admission: RE | Admit: 2012-06-30 | Discharge: 2012-06-30 | Disposition: A | Discharge status: Home / Self Care | Payer: PRIVATE HEALTH INSURANCE | Source: Ambulatory Visit | Attending: Radiation Oncology | Admitting: Radiation Oncology

## 2012-07-01 ENCOUNTER — Ambulatory Visit
Admission: RE | Admit: 2012-07-01 | Discharge: 2012-07-01 | Disposition: A | Discharge status: Home / Self Care | Payer: PRIVATE HEALTH INSURANCE | Source: Ambulatory Visit | Attending: Radiation Oncology | Admitting: Radiation Oncology

## 2012-07-04 ENCOUNTER — Ambulatory Visit
Admission: RE | Admit: 2012-07-04 | Discharge: 2012-07-04 | Disposition: A | Discharge status: Home / Self Care | Payer: PRIVATE HEALTH INSURANCE | Source: Ambulatory Visit | Attending: Radiation Oncology | Admitting: Radiation Oncology

## 2012-07-04 VITALS — BP 112/70 | HR 74 | Temp 98.5°F | Wt 171.0 lb

## 2012-07-04 DIAGNOSIS — C61 Malignant neoplasm of prostate: Secondary | ICD-10-CM

## 2012-07-04 NOTE — Progress Notes (Signed)
Patient here for weekly assessment of pelvic radiation.Had episode of diarrhea over the week-end but all clear now.Informed to get some imodium ad to have in the event this occurs again.Nocuria every 1.5 to 2 hours.Urinary frequency and urgency improved since starting ditropan on 06/27/12.

## 2012-07-04 NOTE — Progress Notes (Signed)
   Weekly Management Note:  outpatient Current Dose:  56.55 Gy  Projected Dose: 78 Gy   Narrative:  The patient presents for routine under treatment assessment.  CBCT/MVCT images/Port film x-rays were reviewed.  The chart was checked. Doing well. Urinary sx improved since last week. A little diarrhea starting  Physical Findings:  weight is 171 lb (77.565 kg). His temperature is 98.5 F (36.9 C). His blood pressure is 112/70 and his pulse is 74.  NAD  Impression:  The patient is tolerating radiotherapy.  Plan:  Continue radiotherapy as planned.  Imodium for diarrhea.  ________________________________   Lonie Peak, M.D.

## 2012-07-05 ENCOUNTER — Ambulatory Visit
Admission: RE | Admit: 2012-07-05 | Discharge: 2012-07-05 | Disposition: A | Discharge status: Home / Self Care | Payer: PRIVATE HEALTH INSURANCE | Source: Ambulatory Visit | Attending: Radiation Oncology | Admitting: Radiation Oncology

## 2012-07-06 ENCOUNTER — Ambulatory Visit
Admission: RE | Admit: 2012-07-06 | Discharge: 2012-07-06 | Disposition: A | Discharge status: Home / Self Care | Payer: PRIVATE HEALTH INSURANCE | Source: Ambulatory Visit | Attending: Radiation Oncology | Admitting: Radiation Oncology

## 2012-07-07 ENCOUNTER — Ambulatory Visit
Admission: RE | Admit: 2012-07-07 | Discharge: 2012-07-07 | Disposition: A | Discharge status: Home / Self Care | Payer: PRIVATE HEALTH INSURANCE | Source: Ambulatory Visit | Attending: Radiation Oncology | Admitting: Radiation Oncology

## 2012-07-08 ENCOUNTER — Ambulatory Visit
Admission: RE | Admit: 2012-07-08 | Discharge: 2012-07-08 | Disposition: A | Discharge status: Home / Self Care | Payer: PRIVATE HEALTH INSURANCE | Source: Ambulatory Visit | Attending: Radiation Oncology | Admitting: Radiation Oncology

## 2012-07-11 ENCOUNTER — Ambulatory Visit
Admission: RE | Admit: 2012-07-11 | Discharge: 2012-07-11 | Disposition: A | Discharge status: Home / Self Care | Payer: PRIVATE HEALTH INSURANCE | Source: Ambulatory Visit | Attending: Radiation Oncology | Admitting: Radiation Oncology

## 2012-07-11 VITALS — BP 122/73 | HR 51 | Temp 97.6°F | Resp 20 | Wt 171.0 lb

## 2012-07-11 DIAGNOSIS — C61 Malignant neoplasm of prostate: Secondary | ICD-10-CM

## 2012-07-11 NOTE — Progress Notes (Signed)
Weekly Management Note:  Site: Prostate Current Dose:  6045  cGy Projected Dose: 7800  cGy  Narrative: The patient is seen today for routine under treatment assessment. CBCT/MVCT images/port films were reviewed. The chart was reviewed.  Bladder filling is approximately 50% of what was seen on CT scan for treatment planning. He is doing the best he can. He continues to have urinary frequency and urgency but this is somewhat improved on Ditropan XL.  Physical Examination:  Filed Vitals:   07/11/12 1335  BP: 122/73  Pulse: 51  Temp: 97.6 F (36.4 C)  Resp: 20  .  Weight: 171 lb (77.565 kg). No change.  Impression: Tolerating radiation therapy well. I encouraged him to improve his bladder filling if at all possible. He'll finish his treatment next week.  Plan: Continue radiation therapy as planned.

## 2012-07-11 NOTE — Progress Notes (Signed)
Pt states he is taking Imodium AD occasionally for diarrhea w/good relief. He continues to have weak urinary stream,  urinary frequency, nocturia q 1 hr, but states he has less urgency, denies dysuria.

## 2012-07-12 ENCOUNTER — Ambulatory Visit
Admission: RE | Admit: 2012-07-12 | Discharge: 2012-07-12 | Disposition: A | Discharge status: Home / Self Care | Payer: PRIVATE HEALTH INSURANCE | Source: Ambulatory Visit | Attending: Radiation Oncology | Admitting: Radiation Oncology

## 2012-07-13 ENCOUNTER — Ambulatory Visit
Admission: RE | Admit: 2012-07-13 | Discharge: 2012-07-13 | Disposition: A | Discharge status: Home / Self Care | Payer: PRIVATE HEALTH INSURANCE | Source: Ambulatory Visit | Attending: Radiation Oncology | Admitting: Radiation Oncology

## 2012-07-14 ENCOUNTER — Ambulatory Visit
Admission: RE | Admit: 2012-07-14 | Discharge: 2012-07-14 | Disposition: A | Discharge status: Home / Self Care | Payer: PRIVATE HEALTH INSURANCE | Source: Ambulatory Visit | Attending: Radiation Oncology | Admitting: Radiation Oncology

## 2012-07-15 ENCOUNTER — Ambulatory Visit
Admission: RE | Admit: 2012-07-15 | Discharge: 2012-07-15 | Disposition: A | Discharge status: Home / Self Care | Payer: PRIVATE HEALTH INSURANCE | Source: Ambulatory Visit | Attending: Radiation Oncology | Admitting: Radiation Oncology

## 2012-07-18 ENCOUNTER — Ambulatory Visit: Payer: PRIVATE HEALTH INSURANCE

## 2012-07-19 ENCOUNTER — Ambulatory Visit: Payer: PRIVATE HEALTH INSURANCE

## 2012-07-19 ENCOUNTER — Ambulatory Visit
Admission: RE | Admit: 2012-07-19 | Payer: PRIVATE HEALTH INSURANCE | Source: Ambulatory Visit | Admitting: Radiation Oncology

## 2012-07-19 ENCOUNTER — Ambulatory Visit
Admission: RE | Admit: 2012-07-19 | Discharge: 2012-07-19 | Disposition: A | Discharge status: Home / Self Care | Payer: PRIVATE HEALTH INSURANCE | Source: Ambulatory Visit | Attending: Radiation Oncology | Admitting: Radiation Oncology

## 2012-07-19 VITALS — BP 116/75 | HR 59 | Temp 98.4°F | Resp 20 | Wt 167.4 lb

## 2012-07-19 DIAGNOSIS — C61 Malignant neoplasm of prostate: Secondary | ICD-10-CM

## 2012-07-19 NOTE — Progress Notes (Signed)
Weekly Management Note:  Site: Prostate/regional lymph nodes Current Dose:  7605  cGy Projected Dose: 7800  cGy  Narrative: The patient is seen today for routine under treatment assessment. CBCT/MVCT images/port films were reviewed. The chart was reviewed.   He continues to have urinary frequency and urgency. His bladder filling is suboptimal. He is on Ditropan XL which has been helpful. His bowels have been loose over the past week and he has been taking Imodium.  Physical Examination:  Filed Vitals:   07/19/12 1343  BP: 116/75  Pulse: 59  Temp: 98.4 F (36.9 C)  Resp: 20  .  Weight: 167 lb 6.4 oz (75.932 kg). No change  Impression: Tolerating radiation therapy reasonably well. His bladder filling has been less than ideal. He is doing the best that he can. I gave him a low residue diet as well as long as he is having diarrhea. He may take Imodium when necessary.  Plan: Continue radiation therapy as planned. Radiation therapy completed tomorrow .

## 2012-07-19 NOTE — Progress Notes (Signed)
Pt reports dysuria, nocturia x 3, daytime frequency, not always emptying his bladder. He states these issues do not occur every time and are improving. Pt states Imodium relieved diarrhea he was having.  Pt completes tomorrow, has FU card.

## 2012-07-20 ENCOUNTER — Encounter: Payer: Self-pay | Admitting: Radiation Oncology

## 2012-07-20 ENCOUNTER — Ambulatory Visit: Payer: PRIVATE HEALTH INSURANCE

## 2012-07-20 ENCOUNTER — Ambulatory Visit
Admission: RE | Admit: 2012-07-20 | Discharge: 2012-07-20 | Disposition: A | Discharge status: Home / Self Care | Payer: PRIVATE HEALTH INSURANCE | Source: Ambulatory Visit | Attending: Radiation Oncology | Admitting: Radiation Oncology

## 2012-07-20 NOTE — Progress Notes (Signed)
[facility] Radiation Oncology End of Treatment Note  Name:Cory Foley  Date: 07/20/2012 QMV:[id number] DOB:January 15, 1951   Status:outpatient    CC: Milinda Antis, MD  Dr. Su Grand  REFERRING PHYSICIAN:   Dr. 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           NARRATIVE:  [name] tolerated his treatment reasonably well, but he had significant bladder irritability which improved with Ditropan XL. He was not very successful keeping a comfortably full bladder during his course of therapy to minimize radiation/bladder toxicity. He also had some loosening of his bowels during/under treatment for which he took an occasional Imodium. He was placed on a low residue diet                          PLAN: Routine followup in one month. Patient instructed to call if questions or worsening complaints in interim.

## 2012-08-16 ENCOUNTER — Encounter: Payer: Self-pay | Admitting: Radiation Oncology

## 2012-08-16 ENCOUNTER — Ambulatory Visit
Admission: RE | Admit: 2012-08-16 | Discharge: 2012-08-16 | Disposition: A | Discharge status: Home / Self Care | Payer: PRIVATE HEALTH INSURANCE | Source: Ambulatory Visit | Attending: Radiation Oncology | Admitting: Radiation Oncology

## 2012-08-16 VITALS — BP 129/86 | HR 63 | Temp 98.8°F | Resp 20 | Wt 165.7 lb

## 2012-08-16 DIAGNOSIS — C61 Malignant neoplasm of prostate: Secondary | ICD-10-CM

## 2012-08-16 NOTE — Progress Notes (Addendum)
CC: Dr. Su Grand, Dr. Milinda Antis  Followup note:  [name] visits today approximately 1 month following completion of radiation therapy along with androgen deprivation therapy in the management of his stage TI C. 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 His major complaint is that of severe fatigue. His urinary frequency and urgency are much improved. His urination is almost back to his baseline status. He has intermittent diarrhea and constipation as well and this is unchanged. He is scheduled for his next Depo-Lupron injection in July. He is being evaluated by his primary care physician for chronic epigastric discomfort and he believes that he will undergo a CT scan. He does not have a definite appointment to see Dr. Brunilda Payor, but I suspect that Dr. Brunilda Payor will see him in late summer or early fall.. Because of his fatigue, he does not feel that he can return to work in the near future.  Physical examination: Alert and oriented. Filed Vitals:   08/16/12 1603  BP: 129/86  Pulse: 63  Temp: 98.8 F (37.1 C)  Resp: 20   Rectal examination not performed today.  Laboratory data: Lab Results  Component Value Date   WBC 6.2 11/12/2011   HGB 14.2 11/12/2011   HCT 41.6 11/12/2011   MCV 92.2 11/12/2011   PLT 218 11/12/2011    Impression: High-risk prostate carcinoma. I explained to the patient and his wife that his fatigue is primarily related to his androgen deprivation therapy which he should be on for a total of 2 years. He has another one and one half years. Some of his fatigue may be still related to recover from radiation therapy expectant be followed recovered from radiation therapy within the next one to 2 months. If he still fatigue in 2 months, and I would not expect him to improve until he is off his androgen deprivation therapy which is necessary for treatment of his high-risk prostate cancer.  Plan: A letter will be written to his employer for extension of his  disability for at least 2 more months. The patient is to call me in 2 months. He'll have his androgen deprivation therapy continue to Dr. Madilyn Hook office with his next Depo-Lupron injection this July. I suspect that he'll see Dr. Brunilda Payor for a formal followup visit late summer or early fall.

## 2012-08-16 NOTE — Progress Notes (Signed)
Follow up s/p rad txs prostae 05/25/12-07/20/12, still has some nocturia 2-3x but improving along with urgency of voiding, has c/o stomach issues, aletrante constipation and diarrhea, will call his primary MD may need CT scan of stomach per patient stating by his primary MD, fatigued still, works out in the yard,   Eating fair, drinks plenty water

## 2012-08-24 ENCOUNTER — Encounter: Payer: Self-pay | Admitting: Family Medicine

## 2012-08-24 ENCOUNTER — Ambulatory Visit (INDEPENDENT_AMBULATORY_CARE_PROVIDER_SITE_OTHER): Payer: PRIVATE HEALTH INSURANCE | Admitting: Family Medicine

## 2012-08-24 VITALS — BP 110/68 | HR 78 | Temp 97.7°F | Resp 18 | Wt 163.0 lb

## 2012-08-24 DIAGNOSIS — R1013 Epigastric pain: Secondary | ICD-10-CM

## 2012-08-24 DIAGNOSIS — E46 Unspecified protein-calorie malnutrition: Secondary | ICD-10-CM | POA: Insufficient documentation

## 2012-08-24 DIAGNOSIS — R109 Unspecified abdominal pain: Secondary | ICD-10-CM

## 2012-08-24 DIAGNOSIS — R634 Abnormal weight loss: Secondary | ICD-10-CM

## 2012-08-24 DIAGNOSIS — Z1211 Encounter for screening for malignant neoplasm of colon: Secondary | ICD-10-CM

## 2012-08-24 DIAGNOSIS — C61 Malignant neoplasm of prostate: Secondary | ICD-10-CM

## 2012-08-24 LAB — CBC WITH DIFFERENTIAL/PLATELET
Basophils Absolute: 0 10*3/uL (ref 0.0–0.1)
Basophils Relative: 0 % (ref 0–1)
Eosinophils Absolute: 0.6 10*3/uL (ref 0.0–0.7)
Eosinophils Relative: 12 % — ABNORMAL HIGH (ref 0–5)
HCT: 38.6 % — ABNORMAL LOW (ref 39.0–52.0)
Hemoglobin: 13 g/dL (ref 13.0–17.0)
Lymphocytes Relative: 13 % (ref 12–46)
Lymphs Abs: 0.7 10*3/uL (ref 0.7–4.0)
MCH: 31.5 pg (ref 26.0–34.0)
MCHC: 33.7 g/dL (ref 30.0–36.0)
MCV: 93.5 fL (ref 78.0–100.0)
Monocytes Absolute: 0.5 10*3/uL (ref 0.1–1.0)
Monocytes Relative: 10 % (ref 3–12)
Neutro Abs: 3.3 10*3/uL (ref 1.7–7.7)
Neutrophils Relative %: 65 % (ref 43–77)
Platelets: 242 10*3/uL (ref 150–400)
RBC: 4.13 MIL/uL — ABNORMAL LOW (ref 4.22–5.81)
RDW: 15.4 % (ref 11.5–15.5)
WBC: 5 10*3/uL (ref 4.0–10.5)

## 2012-08-24 LAB — COMPREHENSIVE METABOLIC PANEL
ALT: 11 U/L (ref 0–53)
AST: 14 U/L (ref 0–37)
Albumin: 4.1 g/dL (ref 3.5–5.2)
Alkaline Phosphatase: 92 U/L (ref 39–117)
BUN: 12 mg/dL (ref 6–23)
CO2: 24 mEq/L (ref 19–32)
Calcium: 9.5 mg/dL (ref 8.4–10.5)
Chloride: 106 mEq/L (ref 96–112)
Creat: 0.79 mg/dL (ref 0.50–1.35)
Glucose, Bld: 91 mg/dL (ref 70–99)
Potassium: 5 mEq/L (ref 3.5–5.3)
Sodium: 139 mEq/L (ref 135–145)
Total Bilirubin: 0.4 mg/dL (ref 0.3–1.2)
Total Protein: 6.2 g/dL (ref 6.0–8.3)

## 2012-08-24 LAB — TSH: TSH: 0.973 u[IU]/mL (ref 0.350–4.500)

## 2012-08-24 NOTE — Assessment & Plan Note (Signed)
Ongoing epigastric pain. He changed his diet he also did PPI with no improvement. The pain is still there is status post chemotherapy and his radiation. This will do CT scan as he does have prostate cancer. Other differentials include gastric or duodenal ulcer other it did not improve with PPI. He'll be referred to gastroenterology as he also needs a colonoscopy and to further evaluate this pain. For now we will continue his hydrocodone which the only thing that helps his pain. He has lost 10 pounds since January with all his treatments

## 2012-08-24 NOTE — Progress Notes (Signed)
  Subjective:    Patient ID: Cory Foley, male    DOB: 11-05-1950, 62 y.o.   MRN: [id number]  HPI  Pt here with chronic abd pain, s/p radiation and chemotherapy for advanced prostate disease, no known mestastic lesions. Currently on hormone therapy as he is in remission. Continues to have epigastric abdominal pain, only relieved by his narcotic medication.  Denies N/V, no change in bowels mostly diarrhea since treatments. No change with food. Has been on PPI for past 2 months with no improvement.   Review of Systems   GEN- + fatigue, fever, weight loss,weakness, recent illness HEENT- denies eye drainage, change in vision, nasal discharge, CVS- denies chest pain, palpitations RESP- denies SOB, cough, wheeze ABD- denies N/V, change in stools, +abd pain GU- denies dysuria, hematuria, dribbling, incontinence MSK- denies joint pain, muscle aches, injury Neuro- denies headache, dizziness, syncope, seizure activity      Objective:   Physical Exam GEN- NAD, alert and oriented x3 HEENT- PERRL, EOMI, non injected sclera, pink conjunctiva, MMM, oropharynx clear Neck- Supple,  CVS- RRR, no murmur RESP-CTAB ABD-NABS,soft,mild TTP upper quandrants, no rebound, no gaurding, no mass palpable EXT- No edema Pulses- Radial, DP- 2+        Assessment & Plan:

## 2012-08-24 NOTE — Assessment & Plan Note (Signed)
Prostate cancer now in early remission. He still on hormone therapy. With his ongoing abdominal pain he CT of abdomen pelvis to evaluate me should there no evidence of metastasis or any other primary tumors. He will also be referred for colonoscopy to GI.

## 2012-08-24 NOTE — Patient Instructions (Signed)
Labs to be done  CT scan to be set up  Dr. Darrick Penna [facility]  F/U as needed

## 2012-08-24 NOTE — Assessment & Plan Note (Signed)
10 pound weight loss since January 2014. Obtain CMET, CBC w diff, TSH, all else per above

## 2012-08-28 ENCOUNTER — Encounter: Payer: Self-pay | Admitting: Family Medicine

## 2012-08-28 NOTE — Telephone Encounter (Signed)
This encounter was created in error - please disregard.

## 2012-08-30 ENCOUNTER — Ambulatory Visit (HOSPITAL_COMMUNITY)
Admission: RE | Admit: 2012-08-30 | Discharge: 2012-08-30 | Disposition: A | Discharge status: Home / Self Care | Payer: PRIVATE HEALTH INSURANCE | Source: Ambulatory Visit | Attending: Family Medicine | Admitting: Family Medicine

## 2012-08-30 DIAGNOSIS — E279 Disorder of adrenal gland, unspecified: Secondary | ICD-10-CM | POA: Insufficient documentation

## 2012-08-30 DIAGNOSIS — R1013 Epigastric pain: Secondary | ICD-10-CM | POA: Insufficient documentation

## 2012-08-30 DIAGNOSIS — R634 Abnormal weight loss: Secondary | ICD-10-CM

## 2012-08-30 DIAGNOSIS — C61 Malignant neoplasm of prostate: Secondary | ICD-10-CM

## 2012-08-30 DIAGNOSIS — R109 Unspecified abdominal pain: Secondary | ICD-10-CM

## 2012-08-30 DIAGNOSIS — R197 Diarrhea, unspecified: Secondary | ICD-10-CM | POA: Insufficient documentation

## 2012-08-30 MED ORDER — IOHEXOL 300 MG/ML  SOLN
100.0000 mL | Freq: Once | INTRAMUSCULAR | Status: AC | PRN
  Administered 2012-08-30: 100 mL via INTRAVENOUS

## 2012-08-31 ENCOUNTER — Other Ambulatory Visit: Payer: Self-pay | Admitting: Family Medicine

## 2012-09-09 ENCOUNTER — Ambulatory Visit
Admission: RE | Admit: 2012-09-09 | Discharge: 2012-09-09 | Disposition: A | Discharge status: Home / Self Care | Payer: PRIVATE HEALTH INSURANCE | Source: Ambulatory Visit | Attending: Family Medicine | Admitting: Family Medicine

## 2012-09-09 MED ORDER — GADOBENATE DIMEGLUMINE 529 MG/ML IV SOLN
15.0000 mL | Freq: Once | INTRAVENOUS | Status: AC | PRN
  Administered 2012-09-09: 15 mL via INTRAVENOUS

## 2012-09-15 ENCOUNTER — Ambulatory Visit: Payer: PRIVATE HEALTH INSURANCE | Admitting: Gastroenterology

## 2012-09-15 ENCOUNTER — Ambulatory Visit (INDEPENDENT_AMBULATORY_CARE_PROVIDER_SITE_OTHER): Payer: PRIVATE HEALTH INSURANCE | Admitting: Gastroenterology

## 2012-09-15 ENCOUNTER — Other Ambulatory Visit: Payer: Self-pay | Admitting: Gastroenterology

## 2012-09-15 ENCOUNTER — Encounter: Payer: Self-pay | Admitting: Gastroenterology

## 2012-09-15 VITALS — BP 121/73 | HR 51 | Temp 98.3°F | Ht 68.0 in | Wt 162.8 lb

## 2012-09-15 DIAGNOSIS — R1013 Epigastric pain: Secondary | ICD-10-CM

## 2012-09-15 DIAGNOSIS — R1319 Other dysphagia: Secondary | ICD-10-CM

## 2012-09-15 DIAGNOSIS — G8929 Other chronic pain: Secondary | ICD-10-CM

## 2012-09-15 DIAGNOSIS — Z1211 Encounter for screening for malignant neoplasm of colon: Secondary | ICD-10-CM

## 2012-09-15 DIAGNOSIS — R1310 Dysphagia, unspecified: Secondary | ICD-10-CM

## 2012-09-15 MED ORDER — PEG-KCL-NACL-NASULF-NA ASC-C 100 G PO SOLR: 1.0000 | ORAL | Status: DC

## 2012-09-15 NOTE — Assessment & Plan Note (Addendum)
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

## 2012-09-15 NOTE — Progress Notes (Signed)
Cc PCP 

## 2012-09-15 NOTE — Patient Instructions (Signed)
YOU WILL HAVE YOUR PROCEDURE AUG 6. YOU CAN CALL us IF YOU NEED TO RESCHEDULE. YOU WILL HAVE A LOWER ENDOSCOPY AND AN UPPER ENDOSCOPY. IF I NEED TO I CAN STRETCH YOUR ESOPHAGUS TO ALLEVIATE YOUR PROBLEM SWALLOWING SOLIDS.  CONTINUE OMEPRAZOLE.  TAKE 30 MINUTES PRIOR TO YOUR FIRST MEAL DAILY.  FOLLOW A LOW FAT DIET. SEE INFO BELOW.  FOLLOW UP IN 4 MOS.    Low-Fat Diet BREADS, CEREALS, PASTA, RICE, DRIED PEAS, AND BEANS These products are high in carbohydrates and most are low in fat. Therefore, they can be increased in the diet as substitutes for fatty foods. They too, however, contain calories and should not be eaten in excess. Cereals can be eaten for snacks as well as for breakfast.   FRUITS AND VEGETABLES It is good to eat fruits and vegetables. Besides being sources of fiber, both are rich in vitamins and some minerals. They help you get the daily allowances of these nutrients. Fruits and vegetables can be used for snacks and desserts.  MEATS Limit lean meat, chicken, [place], and fish to no more than 6 ounces per day. Beef, Pork, and Lamb Use lean cuts of beef, pork, and lamb. Lean cuts include:  Extra-lean ground beef.  Arm roast.  Sirloin tip.  Center-cut ham.  Round steak.  Loin chops.  Rump roast.  Tenderloin.  Trim all fat off the outside of meats before cooking. It is not necessary to severely decrease the intake of red meat, but lean choices should be made. Lean meat is rich in protein and contains a highly absorbable form of iron. Premenopausal women, in particular, should avoid reducing lean red meat because this could increase the risk for low red blood cells (iron-deficiency anemia).  Chicken and [place] These are good sources of protein. The fat of poultry can be reduced by removing the skin and underlying fat layers before cooking. Chicken and [place] can be substituted for lean red meat in the diet. Poultry should not be fried or covered with high-fat sauces. Fish and  Shellfish Fish is a good source of protein. Shellfish contain cholesterol, but they usually are low in saturated fatty acids. The preparation of fish is important. Like chicken and [place], they should not be fried or covered with high-fat sauces. EGGS Egg whites contain no fat or cholesterol. They can be eaten often. Try 1 to 2 egg whites instead of whole eggs in recipes or use egg substitutes that do not contain yolk. MILK AND DAIRY PRODUCTS Use skim or 1% milk instead of 2% or whole milk. Decrease whole milk, natural, and processed cheeses. Use nonfat or low-fat (2%) cottage cheese or low-fat cheeses made from vegetable oils. Choose nonfat or low-fat (1 to 2%) yogurt. Experiment with evaporated skim milk in recipes that call for heavy cream. Substitute low-fat yogurt or low-fat cottage cheese for sour cream in dips and salad dressings. Have at least 2 servings of low-fat dairy products, such as 2 glasses of skim (or 1%) milk each day to help get your daily calcium intake. FATS AND OILS Reduce the total intake of fats, especially saturated fat. Butterfat, lard, and beef fats are high in saturated fat and cholesterol. These should be avoided as much as possible. Vegetable fats do not contain cholesterol, but certain vegetable fats, such as coconut oil, palm oil, and palm kernel oil are very high in saturated fats. These should be limited. These fats are often used in bakery goods, processed foods, popcorn, oils, and nondairy creamers.  Vegetable shortenings and some peanut butters contain hydrogenated oils, which are also saturated fats. Read the labels on these foods and check for saturated vegetable oils. Unsaturated vegetable oils and fats do not raise blood cholesterol. However, they should be limited because they are fats and are high in calories. Total fat should still be limited to 30% of your daily caloric intake. Desirable liquid vegetable oils are corn oil, cottonseed oil, olive oil, canola oil,  safflower oil, soybean oil, and sunflower oil. Peanut oil is not as good, but small amounts are acceptable. Buy a heart-healthy tub margarine that has no partially hydrogenated oils in the ingredients. Mayonnaise and salad dressings often are made from unsaturated fats, but they should also be limited because of their high calorie and fat content. Seeds, nuts, peanut butter, olives, and avocados are high in fat, but the fat is mainly the unsaturated type. These foods should be limited mainly to avoid excess calories and fat. OTHER EATING TIPS Snacks  Most sweets should be limited as snacks. They tend to be rich in calories and fats, and their caloric content outweighs their nutritional value. Some good choices in snacks are graham crackers, melba toast, soda crackers, bagels (no egg), English muffins, fruits, and vegetables. These snacks are preferable to snack crackers, [place] fries, TORTILLA CHIPS, and POTATO chips. Popcorn should be air-popped or cooked in small amounts of liquid vegetable oil. Desserts Eat fruit, low-fat yogurt, and fruit ices instead of pastries, cake, and cookies. Sherbet, angel food cake, gelatin dessert, frozen low-fat yogurt, or other frozen products that do not contain saturated fat (pure fruit juice bars, frozen ice pops) are also acceptable.  COOKING METHODS Choose those methods that use little or no fat. They include: Poaching.  Braising.  Steaming.  Grilling.  Baking.  Stir-frying.  Broiling.  Microwaving.  Foods can be cooked in a nonstick pan without added fat, or use a nonfat cooking spray in regular cookware. Limit fried foods and avoid frying in saturated fat. Add moisture to lean meats by using water, broth, cooking wines, and other nonfat or low-fat sauces along with the cooking methods mentioned above. Soups and stews should be chilled after cooking. The fat that forms on top after a few hours in the refrigerator should be skimmed off. When preparing meals,  avoid using excess salt. Salt can contribute to raising blood pressure in some people.  EATING AWAY FROM HOME Order entres, potatoes, and vegetables without sauces or butter. When meat exceeds the size of a deck of cards (3 to 4 ounces), the rest can be taken home for another meal. Choose vegetable or fruit salads and ask for low-calorie salad dressings to be served on the side. Use dressings sparingly. Limit high-fat toppings, such as bacon, crumbled eggs, cheese, sunflower seeds, and olives. Ask for heart-healthy tub margarine instead of butter.

## 2012-09-15 NOTE — Assessment & Plan Note (Signed)
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  TCS FOR HIGH RISK SCREENING PHENERGAN 12.5 MG IV IN PREOP

## 2012-09-15 NOTE — Assessment & Plan Note (Signed)
ASSOCIATED WITH WEIGHT LOSS-MOST LIKELY DUE TO H PYLORI GASTRITIS, LESS LIKELY PUD OR GASTRIC CA  EGD AUG 6 OMP QD OPV IN 4 MOS

## 2012-09-15 NOTE — Progress Notes (Signed)
Subjective:    Patient ID: Jeri Modena, male    DOB: 1950-07-20, 62 y.o.   MRN: [id number]  Milinda Antis, MD  HPI Had prostate ca. HAD XRT/SEEDS. TAKES HORMONE SHOTS. PAIN IN UPR/MID ABD. SHARP, NO RADIATION., BEEN HAVING  BEGAN CT SHOWED TUMOR IN STOMACH. MAY HAVE CHANGE IN BOWEL-MAY HAVE DIARRHEA . NOT AS MUCH AS IT USED TO BE BUT OCCASIONALLY HE DOES. HAD CONSTIPATION WITH PROSTATE CA RX. OCCASIONALLY FEELS LIKE IT'S HARD TO SWALLOW: SOLIDS ONLY. MORE NOTICEABLE OVER THE PAST 3 DAYS. NO ASPIRIN, BC, GOODYS, IBUPROFEN/MORTIN, OR NAPROXEN/ALEVE. LOST 12 LBS IN PAST 2-3 MOS.  PT DENIES FEVER, CHILLS, BRBPR, nausea, vomiting, melena, constipation,  problems with sedation, heartburn or indigestion.   Past Medical History  Diagnosis Date  . SOB (shortness of breath) on exertion   . PVD (peripheral vascular disease)   . Prostate cancer 01/11/12    BX=Adenocarcinoma,Gleason=4+4=8, & 4+5=9,PSA=17.24,Volume= 15.45cc  . Allergy   . BPH with obstruction/lower urinary tract symptoms   . Nocturia   . ED (erectile dysfunction)   . Anxiety   . Arthritis     knees   Past Surgical History  Procedure Laterality Date  . Hernia repair      Bilateral inguinal X2  . Joint replacement      bilateral  . Nose surgery    . Knee surgery      Left Knee X 2   and Right knee X1  . Pr vein bypass graft,aorto-fem-pop  10/21/10    Left AK to BK popliteal BPG  . Prostate biopsy  01/11/2012    Adenocarcinoma  . Gold seed implatation  04/28/2012   Allergies  Allergen Reactions  . Codeine     Bad headache and sweats  . Morphine And Related Itching    sweats  . Percocet (Oxycodone-Acetaminophen)     Headache   Current Outpatient Prescriptions  Medication Sig Dispense Refill  . aspirin 81 MG tablet Take 81 mg by mouth daily.        Marland Kitchen HYDROcodone-acetaminophen (NORCO) 10-325 MG per tablet Take 1 tablet by mouth every 4 (four) hours as needed for pain. 3 A DAY   . darifenacin (ENABLEX) 7.5 MG 24  hr tablet Take 7.5 mg by mouth daily. RAN OUT   .      Marland Kitchen omeprazole (PRILOSEC) 20 MG capsule Take 1 capsule (20 mg total) by mouth daily.    . ondansetron (ZOFRAN) 4 MG tablet Take 1 tablet (4 mg total) by mouth every 8 (eight) hours as needed for nausea. NOT USING, NO HELP   . HORMONE SHOTS Q6 MOS     Family History  Problem Relation Age of Onset  . Heart disease Mother     Valve regurgitation and Pacemaker   . Heart disease Father     CABG x 5  . Heart disease Sister     aortic valve replacement  . Cancer Sister 63    Colon cancer w/ metastasis  LIVING-1 SISTER: TINA SAX, 1 BROTHER  History  Substance Use Topics  . Smoking status: Current Every Day Smoker -- 0.50 packs/day for 43 years    Types: Cigarettes  . Smokeless tobacco: Never Used     Comment: Pt has reduced smoking to 1/4 ppd  . Alcohol Use: No     Comment: quit 1.5 years ago-MAY HAVE HAD 6 BEERS A DAY FOR 30 YRS    Review of Systems PER HPI OTHERWISE ALL SYSTEMS ARE NEGATIVE.  Jul 2014: CT A/P/MRI-NAIAP, BENIGN ADRENAL ADENOMA     Objective:   Physical Exam  Vitals reviewed. Constitutional: He is oriented to person, place, and time. He appears well-nourished. No distress.  HENT:  Head: Normocephalic and atraumatic.  Mouth/Throat: Oropharynx is clear and moist. No oropharyngeal exudate.  Eyes: Pupils are equal, round, and reactive to light. No scleral icterus.  Neck: Normal range of motion. Neck supple.  Cardiovascular: Normal rate, regular rhythm and normal heart sounds.   Pulmonary/Chest: Effort normal and breath sounds normal. No respiratory distress.  Abdominal: Soft. Bowel sounds are normal. He exhibits no distension. There is tenderness. There is no rebound and no guarding.  MILD TTP IN THE EPIGASTRIUM    Musculoskeletal: Normal range of motion. He exhibits no edema.  Lymphadenopathy:    He has no cervical adenopathy.  Neurological: He is alert and oriented to person, place, and time.  R 7 TH NERVE  PALSY OTHERWISE, NO  NEW FOCAL DEFICITS   Psychiatric:  FLAT AFFECT, NL MOOD          Assessment & Plan:

## 2012-09-19 ENCOUNTER — Encounter (HOSPITAL_COMMUNITY): Payer: Self-pay | Admitting: Pharmacy Technician

## 2012-09-19 ENCOUNTER — Telehealth: Payer: Self-pay | Admitting: Radiation Oncology

## 2012-09-19 NOTE — Telephone Encounter (Signed)
Mailed records to patient; NPE 03/03/12, EOT 07/20/12, FUP 05/03/12, FUP 08/16/12.  OK per RJM.

## 2012-09-21 ENCOUNTER — Encounter: Payer: Self-pay | Admitting: Radiation Oncology

## 2012-09-21 ENCOUNTER — Telehealth: Payer: Self-pay | Admitting: *Deleted

## 2012-09-21 ENCOUNTER — Telehealth: Payer: Self-pay | Admitting: Radiation Oncology

## 2012-09-21 NOTE — Telephone Encounter (Signed)
Pt in nursing requesting refill on Ditropan 10 mg XL. Spoke w/Dr Dayton Scrape who authorized prescription to be called to [facility] [place] w/1 additional refill. Spoke w/Tyler at [facility] and gave refill information. Pt aware.

## 2012-09-21 NOTE — Progress Notes (Signed)
Rec'd paperwork from patient - forwarded to nursing for completion

## 2012-09-21 NOTE — Telephone Encounter (Signed)
Place purple folder with [facility] in Dr. Rennie Plowman inbox. Completed to the best of my ability.

## 2012-09-23 NOTE — Progress Notes (Signed)
Reminder in epic °

## 2012-09-28 ENCOUNTER — Encounter (HOSPITAL_COMMUNITY)
Admission: RE | Disposition: A | Discharge status: Home / Self Care | Payer: Self-pay | Source: Ambulatory Visit | Attending: Gastroenterology

## 2012-09-28 ENCOUNTER — Ambulatory Visit (HOSPITAL_COMMUNITY)
Admission: RE | Admit: 2012-09-28 | Discharge: 2012-09-28 | Disposition: A | Discharge status: Home / Self Care | Payer: PRIVATE HEALTH INSURANCE | Source: Ambulatory Visit | Attending: Gastroenterology | Admitting: Gastroenterology

## 2012-09-28 DIAGNOSIS — R1310 Dysphagia, unspecified: Secondary | ICD-10-CM

## 2012-09-28 DIAGNOSIS — R1319 Other dysphagia: Secondary | ICD-10-CM

## 2012-09-28 DIAGNOSIS — Z1211 Encounter for screening for malignant neoplasm of colon: Secondary | ICD-10-CM | POA: Insufficient documentation

## 2012-09-28 DIAGNOSIS — R634 Abnormal weight loss: Secondary | ICD-10-CM | POA: Insufficient documentation

## 2012-09-28 DIAGNOSIS — Z800 Family history of malignant neoplasm of digestive organs: Secondary | ICD-10-CM | POA: Insufficient documentation

## 2012-09-28 DIAGNOSIS — K2940 Chronic atrophic gastritis without bleeding: Secondary | ICD-10-CM | POA: Insufficient documentation

## 2012-09-28 DIAGNOSIS — R1013 Epigastric pain: Secondary | ICD-10-CM

## 2012-09-28 DIAGNOSIS — Q438 Other specified congenital malformations of intestine: Secondary | ICD-10-CM

## 2012-09-28 DIAGNOSIS — K222 Esophageal obstruction: Secondary | ICD-10-CM

## 2012-09-28 DIAGNOSIS — K2960 Other gastritis without bleeding: Secondary | ICD-10-CM

## 2012-09-28 DIAGNOSIS — K648 Other hemorrhoids: Secondary | ICD-10-CM

## 2012-09-28 HISTORY — PX: COLONOSCOPY: SHX5424

## 2012-09-28 HISTORY — PX: MALONEY DILATION: SHX5535

## 2012-09-28 HISTORY — PX: ESOPHAGOGASTRODUODENOSCOPY: SHX5428

## 2012-09-28 HISTORY — PX: SAVORY DILATION: SHX5439

## 2012-09-28 SURGERY — COLONOSCOPY
Anesthesia: Moderate Sedation

## 2012-09-28 MED ORDER — PROMETHAZINE HCL 25 MG/ML IJ SOLN
12.5000 mg | Freq: Once | INTRAMUSCULAR | Status: AC
  Administered 2012-09-28: 12.5 mg via INTRAVENOUS

## 2012-09-28 MED ORDER — MEPERIDINE HCL 100 MG/ML IJ SOLN
INTRAMUSCULAR | Status: AC
  Filled 2012-09-28: qty 1

## 2012-09-28 MED ORDER — PROMETHAZINE HCL 25 MG/ML IJ SOLN
INTRAMUSCULAR | Status: DC | PRN
  Administered 2012-09-28: 12.5 mg via INTRAVENOUS

## 2012-09-28 MED ORDER — PROMETHAZINE HCL 25 MG/ML IJ SOLN
INTRAMUSCULAR | Status: AC
  Filled 2012-09-28: qty 1

## 2012-09-28 MED ORDER — SODIUM CHLORIDE 0.9 % IV SOLN
INTRAVENOUS | Status: DC
  Administered 2012-09-28: 1000 mL via INTRAVENOUS

## 2012-09-28 MED ORDER — MIDAZOLAM HCL 5 MG/5ML IJ SOLN
INTRAMUSCULAR | Status: AC
  Filled 2012-09-28: qty 10

## 2012-09-28 MED ORDER — BUTAMBEN-TETRACAINE-BENZOCAINE 2-2-14 % EX AERO
INHALATION_SPRAY | CUTANEOUS | Status: DC | PRN
  Administered 2012-09-28: 2 via TOPICAL

## 2012-09-28 MED ORDER — OMEPRAZOLE 20 MG PO CPDR: DELAYED_RELEASE_CAPSULE | ORAL | Status: DC

## 2012-09-28 MED ORDER — SODIUM CHLORIDE 0.9 % IJ SOLN
INTRAMUSCULAR | Status: AC
  Filled 2012-09-28: qty 10

## 2012-09-28 MED ORDER — MEPERIDINE HCL 100 MG/ML IJ SOLN
INTRAMUSCULAR | Status: DC | PRN
  Administered 2012-09-28: 50 mg via INTRAVENOUS
  Administered 2012-09-28 (×2): 25 mg via INTRAVENOUS

## 2012-09-28 MED ORDER — MIDAZOLAM HCL 5 MG/5ML IJ SOLN
INTRAMUSCULAR | Status: DC | PRN
  Administered 2012-09-28 (×2): 2 mg via INTRAVENOUS
  Administered 2012-09-28 (×2): 1 mg via INTRAVENOUS

## 2012-09-28 NOTE — H&P (View-Only) (Signed)
Subjective:    Patient ID: Cory Foley, male    DOB: 07/03/1950, 62 y.o.   MRN: [id number]  [place], KAWANTA, MD  HPI Had prostate ca. HAD XRT/SEEDS. TAKES HORMONE SHOTS. PAIN IN UPR/MID ABD. SHARP, NO RADIATION., BEEN HAVING  BEGAN CT SHOWED TUMOR IN STOMACH. MAY HAVE CHANGE IN BOWEL-MAY HAVE DIARRHEA . NOT AS MUCH AS IT USED TO BE BUT OCCASIONALLY HE DOES. HAD CONSTIPATION WITH PROSTATE CA RX. OCCASIONALLY FEELS LIKE IT'S HARD TO SWALLOW: SOLIDS ONLY. MORE NOTICEABLE OVER THE PAST 3 DAYS. NO ASPIRIN, BC, GOODYS, IBUPROFEN/MORTIN, OR NAPROXEN/ALEVE. LOST 12 LBS IN PAST 2-3 MOS.  PT DENIES FEVER, CHILLS, BRBPR, nausea, vomiting, melena, constipation,  problems with sedation, heartburn or indigestion.   Past Medical History  Diagnosis Date  . SOB (shortness of breath) on exertion   . PVD (peripheral vascular disease)   . Prostate cancer 01/11/12    BX=Adenocarcinoma,Gleason=4+4=8, & 4+5=9,PSA=17.24,Volume= 15.45cc  . Allergy   . BPH with obstruction/lower urinary tract symptoms   . Nocturia   . ED (erectile dysfunction)   . Anxiety   . Arthritis     knees   Past Surgical History  Procedure Laterality Date  . Hernia repair      Bilateral inguinal X2  . Joint replacement      bilateral  . Nose surgery    . Knee surgery      Left Knee X 2   and Right knee X1  . Pr vein bypass graft,aorto-fem-pop  10/21/10    Left AK to BK popliteal BPG  . Prostate biopsy  01/11/2012    Adenocarcinoma  . Gold seed implatation  04/28/2012   Allergies  Allergen Reactions  . Codeine     Bad headache and sweats  . Morphine And Related Itching    sweats  . Percocet (Oxycodone-Acetaminophen)     Headache   Current Outpatient Prescriptions  Medication Sig Dispense Refill  . aspirin 81 MG tablet Take 81 mg by mouth daily.        . HYDROcodone-acetaminophen (NORCO) 10-325 MG per tablet Take 1 tablet by mouth every 4 (four) hours as needed for pain. 3 A DAY   . darifenacin (ENABLEX) 7.5 MG 24  hr tablet Take 7.5 mg by mouth daily. RAN OUT   .      . omeprazole (PRILOSEC) 20 MG capsule Take 1 capsule (20 mg total) by mouth daily.    . ondansetron (ZOFRAN) 4 MG tablet Take 1 tablet (4 mg total) by mouth every 8 (eight) hours as needed for nausea. NOT USING, NO HELP   . HORMONE SHOTS Q6 MOS     Family History  Problem Relation Age of Onset  . Heart disease Mother     Valve regurgitation and Pacemaker   . Heart disease Father     CABG x 5  . Heart disease Sister     aortic valve replacement  . Cancer Sister 45    Colon cancer w/ metastasis  LIVING-1 SISTER: TINA SAX, 1 BROTHER  History  Substance Use Topics  . Smoking status: Current Every Day Smoker -- 0.50 packs/day for 43 years    Types: Cigarettes  . Smokeless tobacco: Never Used     Comment: Pt has reduced smoking to 1/4 ppd  . Alcohol Use: No     Comment: quit 1.5 years ago-MAY HAVE HAD 6 BEERS A DAY FOR 30 YRS    Review of Systems PER HPI OTHERWISE ALL SYSTEMS ARE NEGATIVE.    Jul 2014: CT A/P/MRI-NAIAP, BENIGN ADRENAL ADENOMA     Objective:   Physical Exam  Vitals reviewed. Constitutional: He is oriented to person, place, and time. He appears well-nourished. No distress.  HENT:  Head: Normocephalic and atraumatic.  Mouth/Throat: Oropharynx is clear and moist. No oropharyngeal exudate.  Eyes: Pupils are equal, round, and reactive to light. No scleral icterus.  Neck: Normal range of motion. Neck supple.  Cardiovascular: Normal rate, regular rhythm and normal heart sounds.   Pulmonary/Chest: Effort normal and breath sounds normal. No respiratory distress.  Abdominal: Soft. Bowel sounds are normal. He exhibits no distension. There is tenderness. There is no rebound and no guarding.  MILD TTP IN THE EPIGASTRIUM    Musculoskeletal: Normal range of motion. He exhibits no edema.  Lymphadenopathy:    He has no cervical adenopathy.  Neurological: He is alert and oriented to person, place, and time.  R 7 TH NERVE  PALSY OTHERWISE, NO  NEW FOCAL DEFICITS   Psychiatric:  FLAT AFFECT, NL MOOD          Assessment & Plan:   

## 2012-09-28 NOTE — Op Note (Signed)
[facility] [address] [place], [address]   ENDOSCOPY PROCEDURE REPORT  PATIENT: Cory Foley, Cory Foley  MR#: [id number] BIRTHDATE: 1950-07-15 , 62  yrs. old GENDER: Male  ENDOSCOPIST: Jonette Eva, MD REFFERED [id number] [place], M.D.  PROCEDURE DATE:  09/28/2012 PROCEDURE:   EGD with biopsy and EGD with dilatation over guidewire   INDICATIONS:1.  dyspepsia.   2.  dysphagia. 3. UNINTENTIONAL WEIGHT LOSS CT JUL 8: NAIAP, ADERNAL ADENOMA MEDICATIONS: TCS +Demerol 25 mg IV and Versed 1mg  IV TOPICAL ANESTHETIC: Cetacaine Spray  DESCRIPTION OF PROCEDURE:   After the risks benefits and alternatives of the procedure were thoroughly explained, informed consent was obtained.  The EC-3890Li ([id number]) and EG-[id number] ([id number])  endoscope was introduced through the mouth and advanced to the second portion of the duodenum. The instrument was slowly withdrawn as the mucosa was carefully examined.  Prior to withdrawal of the scope, the guidwire was placed.  The esophagus was dilated successfully.  The patient was recovered in endoscopy and discharged home in satisfactory condition.   ESOPHAGUS: A stricture was found at the gastroesophageal junction. The stenosis was traversable with the endoscope.   STOMACH: Moderate erosive gastritis (inflammation) was found in the gastric antrum and on the greater curvature of the gastric body.  Multiple biopsies were performed using cold forceps.   DUODENUM: Mild duodenal inflammation was found in the duodenal bulb.   The duodenal mucosa showed no abnormalities in the bulb and second portion of the duodenum.   Dilation was then performed at the gastroesphageal junction Dilator: Savary over guidewire Size(s): 14-16 MM Resistance: moderate Heme: none  COMPLICATIONS: There were no complications.   ENDOSCOPIC IMPRESSION: 1.   Stricture was found at the gastroesophageal junction 2.   MODERATE Erosive gastritis & MILD Duodenal  inflammation  RECOMMENDATIONS: CONTINUE OMEPRAZOLE.  TAKE 30 MINUTES PRIOR TO YOUR MEALS TWICE DAILY THEN 30 MINS PRIOR TO BREAKFAST FOREVER. FOLLOW A HIGH FIBER/LOW FAT DIET.  AVOID ITEMS THAT CAUSE BLOATING.  BIOPSY WILL BE BACK IN 7 DAYS. OPV IN NOV 2014      _______________________________ eSignedJonette Eva, MD 09/28/2012 5:45 PM

## 2012-09-28 NOTE — Op Note (Signed)
[facility] [address] [place], [address]   COLONOSCOPY PROCEDURE REPORT  PATIENT: Cory Foley, Cory Foley  MR#: [id number] BIRTHDATE: 09-27-1950 , 62  yrs. old GENDER: Male ENDOSCOPIST: Jonette Eva, MD REFERRED [id number] [place], M.D. PROCEDURE DATE:  09/28/2012 PROCEDURE:   Colonoscopy, screening INDICATIONS:Patient's immediate family history of colon cancer. MEDICATIONS: PREOP: Promethazine (Phenergan) 25mg  IV, Demerol 75 mg IV, and Versed 5 mg IV  DESCRIPTION OF PROCEDURE:    Physical exam was performed.  Informed consent was obtained from the patient after explaining the benefits, risks, and alternatives to procedure.  The patient was connected to monitor and placed in left lateral position. Continuous oxygen was provided by nasal cannula and IV medicine administered through an indwelling cannula.  After administration of sedation and rectal exam, the patients rectum was intubated and the EC-3890Li ([id number])  colonoscope was advanced under direct visualization to the ileum.  The scope was removed slowly by carefully examining the color, texture, anatomy, and integrity mucosa on the way out.  The patient was recovered in endoscopy and discharged home in satisfactory condition.   PICTURE AVAILABLE IN DATABASE FOR EGD COLON FINDINGS: The mucosa appeared normal in the terminal ileum.  10 CM VISULAIZED. The colon IS redundant.  The patient was moved on to their back to reach the cecum.  Manual abdominal counter-pressure was used to reach the cecum, The colon was otherwise normal.  There was no diverticulosis, inflammation, polyps or cancers unless previously stated.  , and Small internal hemorrhoids were found.  PREP QUALITY: good.   CECAL W/D TIME: 14 minutes     COMPLICATIONS: None  ENDOSCOPIC IMPRESSION: 1.   Normal mucosa in the terminal ileum 2.   The colon IS redundant 3.   The colon was otherwise normal 4.   Small internal  hemorrhoids   RECOMMENDATIONS: HIGH FIBER DIET TCS IN 5 YEARS WITH PT ON BACK       _______________________________ eSignedJonette Eva, MD 09/28/2012 5:38 PM

## 2012-09-28 NOTE — Interval H&P Note (Signed)
History and Physical Interval Note:  09/28/2012 8:58 AM  Cory Foley  has presented today for surgery, with the diagnosis of DYSPEPSIA AND DYSPHAGIA  The various methods of treatment have been discussed with the patient and family. After consideration of risks, benefits and other options for treatment, the patient has consented to  Procedure(s) with comments: COLONOSCOPY (N/A) - 8:30 ESOPHAGOGASTRODUODENOSCOPY (EGD) (N/A) SAVORY DILATION (N/A) MALONEY DILATION (N/A) as a surgical intervention .  The patient's history has been reviewed, patient examined, no change in status, stable for surgery.  I have reviewed the patient's chart and labs.  Questions were answered to the patient's satisfaction.     [employer]

## 2012-09-29 ENCOUNTER — Telehealth: Payer: Self-pay | Admitting: Gastroenterology

## 2012-09-29 NOTE — Telephone Encounter (Signed)
PLEASE CALL PT. His discharge papers stated:  CONTINUE OMEPRAZOLE.  TAKE 30 MINUTES PRIOR TO YOUR MEALS TWICE DAILY THEN 30 MINS PRIOR TO BREAKFAST FOREVER.  FOLLOW A HIGH FIBER/LOW FAT DIET. AVOID ITEMS THAT CAUSE BLOATING.   YOUR BIOPSY WILL BE BACK IN 7 DAYS.  Next colonoscopy in 5 years.

## 2012-09-29 NOTE — Telephone Encounter (Signed)
Called and informed pt he does not need appt in 7 days. We will call with biopsy results. He is aware of the other info.

## 2012-09-29 NOTE — Telephone Encounter (Signed)
Pt's family member called to say that patient had tcs/egd yesterday and was told that the patient needed to follow up in 7 days with SF. I have the patient on the recall to follow up in November and the patient is to have next tcs in 5 years. I told family member that it was probably 7 days before his results were available and the nurse would be calling him with those when available. Please clarify if patient needs follow up in 7 days. [phone]

## 2012-09-30 ENCOUNTER — Encounter (HOSPITAL_COMMUNITY): Payer: Self-pay | Admitting: Gastroenterology

## 2012-09-30 NOTE — Telephone Encounter (Signed)
Reminder in epic °

## 2012-10-06 ENCOUNTER — Telehealth: Payer: Self-pay | Admitting: Gastroenterology

## 2012-10-06 NOTE — Telephone Encounter (Signed)
Please call pt. His stomach Bx shows gastritis.  HER SMALL BOWEL BIOPSIES ARE NORMAL.  CONTINUE OMEPRAZOLE.  TAKE 30 MINUTES PRIOR TO YOUR MEALS TWICE DAILY THEN 30 MINS PRIOR TO BREAKFAST FOREVER.  FOLLOW A HIGH FIBER/LOW FAT DIET. AVOID ITEMS THAT CAUSE BLOATING.   OPV IN NOV 2014 E30 DYSPEPSIA/DYSPHAGIA W/ SLF  Next colonoscopy in 5 years.

## 2012-10-06 NOTE — Telephone Encounter (Signed)
Pt aware of results 

## 2012-10-06 NOTE — Telephone Encounter (Signed)
Reminder in epic °

## 2012-10-07 ENCOUNTER — Ambulatory Visit (INDEPENDENT_AMBULATORY_CARE_PROVIDER_SITE_OTHER): Payer: PRIVATE HEALTH INSURANCE | Admitting: Family Medicine

## 2012-10-07 ENCOUNTER — Encounter: Payer: Self-pay | Admitting: Family Medicine

## 2012-10-07 VITALS — BP 108/70 | HR 68 | Temp 97.5°F | Resp 18 | Wt 169.0 lb

## 2012-10-07 DIAGNOSIS — C61 Malignant neoplasm of prostate: Secondary | ICD-10-CM

## 2012-10-07 DIAGNOSIS — R5382 Chronic fatigue, unspecified: Secondary | ICD-10-CM

## 2012-10-07 DIAGNOSIS — M533 Sacrococcygeal disorders, not elsewhere classified: Secondary | ICD-10-CM

## 2012-10-07 DIAGNOSIS — J029 Acute pharyngitis, unspecified: Secondary | ICD-10-CM

## 2012-10-07 DIAGNOSIS — D3501 Benign neoplasm of right adrenal gland: Secondary | ICD-10-CM

## 2012-10-07 DIAGNOSIS — R5381 Other malaise: Secondary | ICD-10-CM

## 2012-10-07 DIAGNOSIS — J069 Acute upper respiratory infection, unspecified: Secondary | ICD-10-CM

## 2012-10-07 DIAGNOSIS — D3500 Benign neoplasm of unspecified adrenal gland: Secondary | ICD-10-CM

## 2012-10-07 DIAGNOSIS — R5383 Other fatigue: Secondary | ICD-10-CM

## 2012-10-07 MED ORDER — AZITHROMYCIN 250 MG PO TABS: ORAL_TABLET | ORAL | Status: DC

## 2012-10-07 MED ORDER — FIRST-DUKES MOUTHWASH MT SUSP: OROMUCOSAL | Status: DC

## 2012-10-07 NOTE — Progress Notes (Signed)
  Subjective:    Patient ID: Cory Foley, male    DOB: 1950/09/23, 62 y.o.   MRN: [id number]  HPI  Pt here to f/u chronic medical problems, cough with congestion and production for past week. Thinks it is worsening. No SOB, no wheezing. No OTC meds taken, denies fever. Has also had sore throat since his EGD, has discomfort with swallowing, tried gargling with salt water little relief, notified GI advised was normal and would resolved. Abd pain- seen by GI, chronic gastritis seen on EGD, had biopsies done which were benign, colonoscopy unremarkable, on PPI doing fairly well Prostate cancer- continues to have severe fatigue as well as back pain and coccyx pain, currently on hormone injections but PSA levels seem to be improving, had recent f/u with urology awaiting results Benign adrenal adenoma- incidental finding on CT of abd, had MRI shows benign adenoma.  Long term disability form needed Review of Systems  - per above   GEN- + fatigue, fever, weight loss,weakness, recent illness HEENT- denies eye drainage, change in vision, nasal discharge, CVS- denies chest pain, palpitations RESP- denies SOB,+ cough, wheeze ABD- denies N/V, change in stools, abd pain GU- denies dysuria, hematuria, dribbling, incontinence MSK- + joint pain, muscle aches, injury Neuro- denies headache, dizziness, syncope, seizure activity      Objective:   Physical Exam GEN- NAD, alert and oriented x3 HEENT- PERRL, EOMI, non injected sclera, pink conjunctiva, MMM, oropharynx mild injection no exudates, TM clear bilat no effusion, no maxillary sinus tenderness, nares clear Neck- Supple, no LAD CVS- RRR, no murmur RESP-upperairway congestion, no wheeze, harsh cough, good air movement ABD-NABS,soft,NT,ND EXT- No edema Pulses- Radial 2+         Assessment & Plan:

## 2012-10-07 NOTE — Patient Instructions (Signed)
Long term disability papers to be sent Take the robitussin Take zpak for Upper respiratory infection F/U 4 months

## 2012-10-09 DIAGNOSIS — J069 Acute upper respiratory infection, unspecified: Secondary | ICD-10-CM | POA: Insufficient documentation

## 2012-10-09 DIAGNOSIS — R5382 Chronic fatigue, unspecified: Secondary | ICD-10-CM | POA: Insufficient documentation

## 2012-10-09 DIAGNOSIS — J029 Acute pharyngitis, unspecified: Secondary | ICD-10-CM | POA: Insufficient documentation

## 2012-10-09 DIAGNOSIS — D3500 Benign neoplasm of unspecified adrenal gland: Secondary | ICD-10-CM | POA: Insufficient documentation

## 2012-10-09 NOTE — Assessment & Plan Note (Addendum)
Obtain last note, currently on hormone therapy Continues to suffer with urinary frequency and urgency, on vesicare

## 2012-10-09 NOTE — Assessment & Plan Note (Signed)
As a result of prostate cancer and treatments, unable to perform his regular job duties at this time, without potential injury to self

## 2012-10-09 NOTE — Assessment & Plan Note (Signed)
Chronic pain , in tailobone and back with treatments and Prostate cancer

## 2012-10-09 NOTE — Assessment & Plan Note (Signed)
Benign lesion, will forward MRI to urology for review no intervention needed at this time

## 2012-10-09 NOTE — Assessment & Plan Note (Signed)
likley due to EGD procedure, with irritation and pain will give Duke's magic mouthwash

## 2012-10-09 NOTE — Assessment & Plan Note (Signed)
Robitussin zpak due to worsening symptoms

## 2012-10-10 ENCOUNTER — Encounter: Payer: Self-pay | Admitting: Family Medicine

## 2012-10-12 ENCOUNTER — Ambulatory Visit: Payer: PRIVATE HEALTH INSURANCE | Admitting: Gastroenterology

## 2012-10-12 ENCOUNTER — Telehealth: Payer: Self-pay | Admitting: Family Medicine

## 2012-10-12 MED ORDER — PANTOPRAZOLE SODIUM 40 MG PO TBEC: 40.0000 mg | DELAYED_RELEASE_TABLET | Freq: Every day | ORAL | Status: DC

## 2012-10-12 NOTE — Telephone Encounter (Signed)
I would advise him to call Dr. Darrick Penna to let them know that abdominal pain is not getting better The adrenal tumor is benign it does not cause any pain, nothing to do with this at this time like we discussed.  For now try protonix 40mg  once a day and stop the prilosec , please send in 30 day supply to pharmacy

## 2012-10-12 NOTE — Telephone Encounter (Signed)
Pt's wife aware and will call Dr. Darrick Penna and rx sent to pharm.

## 2012-10-12 NOTE — Telephone Encounter (Signed)
Since having endo procedures has been using Prilosec.  Continuing to have worsening abdominal pain.  Is concerned.  Also concerned about adrenal gland.  Is something going to be done??  Has appt with Dr Brunilda Payor in November.  Would like to talk with you if possible?

## 2012-10-26 ENCOUNTER — Ambulatory Visit (HOSPITAL_COMMUNITY)
Admission: RE | Admit: 2012-10-26 | Discharge: 2012-10-26 | Disposition: A | Discharge status: Home / Self Care | Payer: PRIVATE HEALTH INSURANCE | Source: Ambulatory Visit | Attending: Family Medicine | Admitting: Family Medicine

## 2012-10-26 ENCOUNTER — Other Ambulatory Visit: Payer: Self-pay | Admitting: Family Medicine

## 2012-10-26 ENCOUNTER — Telehealth: Payer: Self-pay | Admitting: Family Medicine

## 2012-10-26 DIAGNOSIS — R059 Cough, unspecified: Secondary | ICD-10-CM | POA: Insufficient documentation

## 2012-10-26 DIAGNOSIS — J069 Acute upper respiratory infection, unspecified: Secondary | ICD-10-CM

## 2012-10-26 DIAGNOSIS — J449 Chronic obstructive pulmonary disease, unspecified: Secondary | ICD-10-CM

## 2012-10-26 DIAGNOSIS — R05 Cough: Secondary | ICD-10-CM | POA: Insufficient documentation

## 2012-10-26 MED ORDER — ALBUTEROL SULFATE HFA 108 (90 BASE) MCG/ACT IN AERS: 2.0000 | INHALATION_SPRAY | Freq: Four times a day (QID) | RESPIRATORY_TRACT | Status: DC | PRN

## 2012-10-26 MED ORDER — PREDNISONE 10 MG PO TABS: ORAL_TABLET | ORAL | Status: DC

## 2012-10-26 NOTE — Telephone Encounter (Signed)
Pt's wife aware to have xray done at [facility]. And informed we will call after xray results with further plans on treatment and order placed.

## 2012-10-26 NOTE — Telephone Encounter (Signed)
Please have him get CXR, can be done at [facility] to place order 2View CXR,  Dx URI,   We will then call with instructions

## 2012-10-26 NOTE — Telephone Encounter (Signed)
Can you make this erroneous? There are three messages in Jeanie's inbox that have nothing in them and just trying to get rid of them. Thanks! °

## 2012-10-26 NOTE — Telephone Encounter (Signed)
This encounter was created in error - please disregard.

## 2012-10-26 NOTE — Telephone Encounter (Signed)
Pt's wife called stating that you seen pt on 10/07/12 and treated him for URI with Zpack and he did get better for a while but has started getting sick again and it is worse this time then before. Can we call him in something else or do you want to see him in an OV?

## 2012-11-01 ENCOUNTER — Encounter: Payer: Self-pay | Admitting: Radiation Oncology

## 2012-11-01 NOTE — Progress Notes (Signed)
Faxed progress note for 08/16/12 to [facility] [employer]. [phone]

## 2012-11-07 ENCOUNTER — Ambulatory Visit (HOSPITAL_COMMUNITY)
Admission: RE | Admit: 2012-11-07 | Discharge: 2012-11-07 | Disposition: A | Discharge status: Home / Self Care | Payer: PRIVATE HEALTH INSURANCE | Source: Ambulatory Visit | Attending: Family Medicine | Admitting: Family Medicine

## 2012-11-07 DIAGNOSIS — R0602 Shortness of breath: Secondary | ICD-10-CM | POA: Insufficient documentation

## 2012-11-07 DIAGNOSIS — J449 Chronic obstructive pulmonary disease, unspecified: Secondary | ICD-10-CM | POA: Insufficient documentation

## 2012-11-07 DIAGNOSIS — J4489 Other specified chronic obstructive pulmonary disease: Secondary | ICD-10-CM | POA: Insufficient documentation

## 2012-11-07 MED ORDER — ALBUTEROL SULFATE (5 MG/ML) 0.5% IN NEBU
2.5000 mg | INHALATION_SOLUTION | Freq: Once | RESPIRATORY_TRACT | Status: AC
  Administered 2012-11-07: 2.5 mg via RESPIRATORY_TRACT

## 2012-11-08 NOTE — Procedures (Signed)
Cory Foley, Cory Foley                ACCOUNT NO.:  [id number]  MEDICAL RECORD NO.:  [id number]  LOCATION:  RESP                          FACILITY:  [facility]  PHYSICIAN:  Amyia Lodwick L. Juanetta Foley, M.D.DATE OF BIRTH:  23-Feb-1951  DATE OF PROCEDURE: DATE OF DISCHARGE:  11/07/2012                           PULMONARY FUNCTION TEST   REASON FOR PULMONARY FUNCTION TESTING:  COPD.  1. Spirometry shows no ventilatory defect with mild airflow     obstruction. 2. Lung volumes show air trapping. 3. DLCO is moderately reduced, but corrects for when volume is taken     into account. 4. Airway resistance is normal. 5. There is no significant bronchodilator improvement. 6. 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  Other possible causes of     shortness of breath should be taken into account.     Cory Foley, M.D.     ELH/MEDQ  D:  11/07/2012  T:  11/08/2012  Job:  [id number]  cc:   Milinda Antis, MD

## 2012-11-09 LAB — PULMONARY FUNCTION TEST

## 2012-11-14 ENCOUNTER — Encounter: Payer: Self-pay | Admitting: Family Medicine

## 2012-12-07 ENCOUNTER — Encounter: Payer: Self-pay | Admitting: Family Medicine

## 2012-12-07 ENCOUNTER — Ambulatory Visit (INDEPENDENT_AMBULATORY_CARE_PROVIDER_SITE_OTHER): Payer: PRIVATE HEALTH INSURANCE | Admitting: Family Medicine

## 2012-12-07 VITALS — BP 110/80 | HR 70 | Temp 98.3°F | Resp 16 | Wt 171.0 lb

## 2012-12-07 DIAGNOSIS — R5382 Chronic fatigue, unspecified: Secondary | ICD-10-CM

## 2012-12-07 DIAGNOSIS — R5381 Other malaise: Secondary | ICD-10-CM

## 2012-12-07 DIAGNOSIS — C61 Malignant neoplasm of prostate: Secondary | ICD-10-CM

## 2012-12-07 DIAGNOSIS — M549 Dorsalgia, unspecified: Secondary | ICD-10-CM | POA: Insufficient documentation

## 2012-12-07 DIAGNOSIS — F17200 Nicotine dependence, unspecified, uncomplicated: Secondary | ICD-10-CM

## 2012-12-07 DIAGNOSIS — G2581 Restless legs syndrome: Secondary | ICD-10-CM | POA: Insufficient documentation

## 2012-12-07 DIAGNOSIS — J449 Chronic obstructive pulmonary disease, unspecified: Secondary | ICD-10-CM

## 2012-12-07 MED ORDER — ROPINIROLE HCL 0.25 MG PO TABS: 0.5000 mg | ORAL_TABLET | Freq: Every day | ORAL | Status: DC

## 2012-12-07 MED ORDER — PANTOPRAZOLE SODIUM 40 MG PO TBEC: 40.0000 mg | DELAYED_RELEASE_TABLET | Freq: Every day | ORAL | Status: DC

## 2012-12-07 MED ORDER — HYDROCODONE-ACETAMINOPHEN 10-325 MG PO TABS: 1.0000 | ORAL_TABLET | ORAL | Status: DC | PRN

## 2012-12-07 NOTE — Assessment & Plan Note (Signed)
Acute back pain. At this time no sign of any decompensation. His exam is fairly normal. He did have imaging there was no evidence of metastasis to this region. He can continue his current pain medication as needed

## 2012-12-07 NOTE — Assessment & Plan Note (Signed)
He has upcoming appointment with his urologist. I will send over all information after review to his insurance company.

## 2012-12-07 NOTE — Assessment & Plan Note (Signed)
Unchanged continue to work on cessation

## 2012-12-07 NOTE — Assessment & Plan Note (Signed)
Unchanged. Unfortunately this is due to his is a cancer treatments. They're trying to keep his hormone levels down low. He is unable to work secondary to the symptoms. I will complete paperwork needed for his insurance company. He will be persistent in this state until he comes off the medications which may take a year or 2

## 2012-12-07 NOTE — Patient Instructions (Signed)
Start with 1 tablet of requip at bedtime, if this does not help after 1 week go up to 2 tablets  Continue pain medication I will send in letter and information to [employer] F/U December

## 2012-12-07 NOTE — Assessment & Plan Note (Signed)
Will try him on low-dose Requip and see how he does.

## 2012-12-07 NOTE — Progress Notes (Signed)
  Subjective:    Patient ID: Cory Foley, male    DOB: 1950/05/26, 62 y.o.   MRN: [id number]  HPI  Patient here for sick visit.  He has multiple concerns. He continues to have difficulty with his restless leg syndrome and will like to try medication for it. He states that his legs kick all night he cannot sleep do to the movement and also the pain. He does take his pain medication as needed but this still does not calm his leg movement down.  Back pain- as a back pain for the past couple weeks he only has it when he tries to do something active for tries to bend over. He denies any radiation of the pain mostly in his lumbar region. He denies any change in bowel or bladder. He did have a bone scan which was negative and he had a CT of abdomen which showed some mild degenerative changes.  Fatigue he continues to have chronic fatigue secondary to his hormone injections for his prostate cancer. He is having difficulty with his insurance company and has some paperwork that needs to be completed so that he can get his long-term disability payments. We discussed his job which she is unable to return to at this time. They have a limitation of lifting a 50-75 pounds which is unable to be also he has to stand for long periods of time without breaks. He also has to push and pull on heavy materials at his job which she is unable to do at this time.  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 His cough has improved. His pulmonary function tests showed mild obstructive changes. He has not quit smoking but is working on this. Declines flu shot  Review of Systems  GEN- denies fatigue, fever, weight loss,weakness, recent illness HEENT- denies eye drainage, change in vision, nasal discharge, CVS- denies chest pain, palpitations RESP- denies SOB, cough, wheeze ABD- denies N/V, change in stools, abd pain GU- denies dysuria, hematuria, dribbling, incontinence MSK- + joint pain, muscle aches, injury Neuro-  denies headache, dizziness, syncope, seizure activity       Objective:   Physical Exam GEN- NAD, alert and oriented x3 HEENT- PERRL, EOMI, non injected sclera, pink conjunctiva, MMM, oropharynx clear Neck- Supple CVS- RRR, no murmur RESP-CTAB ABD-NABS,soft,NT,ND MSK- Mild TTp lumbar spine, neg SLR, stiff with extension/flexion NEURO- CNII-XII in tact, no focal deficits, sensation in tact bilat, DTR symmetric, motor equal bilat EXT- No edema Pulses- Radial 2+        Assessment & Plan:

## 2012-12-20 ENCOUNTER — Telehealth: Payer: Self-pay | Admitting: Family Medicine

## 2012-12-20 NOTE — Telephone Encounter (Signed)
Please call her - she said it is very important Regarding Cory Foley

## 2012-12-20 NOTE — Telephone Encounter (Signed)
Pt calling to see what was the hold up on disablity, I explained to her that it takes a couple of days to send through mail that it goes through a process at hospital before actual send out.

## 2012-12-26 ENCOUNTER — Ambulatory Visit (INDEPENDENT_AMBULATORY_CARE_PROVIDER_SITE_OTHER): Payer: PRIVATE HEALTH INSURANCE | Admitting: Family Medicine

## 2012-12-26 ENCOUNTER — Encounter: Payer: Self-pay | Admitting: Family Medicine

## 2012-12-26 VITALS — BP 100/70 | HR 68 | Temp 98.5°F | Resp 18 | Ht 68.0 in | Wt 171.0 lb

## 2012-12-26 DIAGNOSIS — R7302 Impaired glucose tolerance (oral): Secondary | ICD-10-CM

## 2012-12-26 DIAGNOSIS — E785 Hyperlipidemia, unspecified: Secondary | ICD-10-CM

## 2012-12-26 DIAGNOSIS — R7309 Other abnormal glucose: Secondary | ICD-10-CM

## 2012-12-26 MED ORDER — SIMVASTATIN 10 MG PO TABS: 10.0000 mg | ORAL_TABLET | Freq: Every day | ORAL | Status: DC

## 2012-12-26 NOTE — Assessment & Plan Note (Addendum)
Start zocor 10mg  , has PVD

## 2012-12-26 NOTE — Assessment & Plan Note (Signed)
Continue watch glucose levels

## 2012-12-26 NOTE — Progress Notes (Signed)
  Subjective:    Patient ID: Cory Foley, male    DOB: 07/29/1950, 62 y.o.   MRN: [id number]  HPI  Patient here to followup lab work done by his insurance company. He had wellness exam completed by his insurance company which included blood work. He is here today to review the results. His blood work was concerning for total cholesterol 255 LDL 178 his A1c was 6.1%   Review of Systems - per above       Objective:   Physical Exam  GEN-NAD,alert and oriented x 3      Assessment & Plan:

## 2012-12-26 NOTE — Patient Instructions (Signed)
Start cholesterol medication  Low cholesterol, low sugar diet  F/U keep previous F/U appt

## 2012-12-29 ENCOUNTER — Other Ambulatory Visit: Payer: Self-pay

## 2013-01-26 ENCOUNTER — Ambulatory Visit (INDEPENDENT_AMBULATORY_CARE_PROVIDER_SITE_OTHER): Payer: PRIVATE HEALTH INSURANCE | Admitting: Gastroenterology

## 2013-01-26 ENCOUNTER — Encounter: Payer: Self-pay | Admitting: Gastroenterology

## 2013-01-26 ENCOUNTER — Ambulatory Visit: Payer: PRIVATE HEALTH INSURANCE | Admitting: Gastroenterology

## 2013-01-26 VITALS — BP 113/72 | HR 64 | Temp 99.1°F | Ht 68.0 in | Wt 175.2 lb

## 2013-01-26 DIAGNOSIS — R1310 Dysphagia, unspecified: Secondary | ICD-10-CM

## 2013-01-26 DIAGNOSIS — R634 Abnormal weight loss: Secondary | ICD-10-CM

## 2013-01-26 NOTE — Progress Notes (Signed)
Subjective:    Patient ID: Cory Foley, male    DOB: February 06, 1951, 62 y.o.   MRN: [id number]  Milinda Antis, MD  HPI OCCASIONAL DYSPHAGIA: 1-2X/MO. SX IMPROVED. PT DENIES FEVER, CHILLS, BRBPR, nausea, vomiting, melena, diarrhea, constipation, abd pain, problems swallowing, problems with sedation, OR heartburn or indigestion.  Past Medical History  Diagnosis Date  . SOB (shortness of breath) on exertion   . PVD (peripheral vascular disease)   . Prostate cancer 01/11/12    BX=Adenocarcinoma,Gleason=4+4=8, & 4+5=9,PSA=17.24,Volume= 15.45cc  . Allergy   . BPH with obstruction/lower urinary tract symptoms   . Nocturia   . ED (erectile dysfunction)   . Anxiety   . Arthritis     knees  . COPD, mild     Past Surgical History  Procedure Laterality Date  . Hernia repair      Bilateral inguinal X2  . Joint replacement      bilateral  . Nose surgery    . Knee surgery      Left Knee X 2   and Right knee X1  . Pr vein bypass graft,aorto-fem-pop  10/21/10    Left AK to BK popliteal BPG  . Prostate biopsy  01/11/2012    Adenocarcinoma  . Gold seed implatation  04/28/2012  . Colonoscopy N/A 09/28/2012    Procedure: COLONOSCOPY;  Surgeon: West Bali, MD;  Location: [facility];  Service: Endoscopy;  Laterality: N/A;  8:30  . Esophagogastroduodenoscopy N/A 09/28/2012    Procedure: ESOPHAGOGASTRODUODENOSCOPY (EGD);  Surgeon: West Bali, MD;  Location: [facility];  Service: Endoscopy;  Laterality: N/A;  . Savory dilation N/A 09/28/2012    Procedure: SAVORY DILATION;  Surgeon: West Bali, MD;  Location: [facility];  Service: Endoscopy;  Laterality: N/A;  Elease Hashimoto dilation N/A 09/28/2012    Procedure: Elease Hashimoto DILATION;  Surgeon: West Bali, MD;  Location: [facility];  Service: Endoscopy;  Laterality: N/A;   Allergies  Allergen Reactions  . Codeine Other (See Comments)    Bad headache and sweats  . Morphine And Related Itching    sweats  . Percocet  [Oxycodone-Acetaminophen] Other (See Comments)    Headache    Current Outpatient Prescriptions  Medication Sig Dispense Refill  . albuterol (PROVENTIL HFA;VENTOLIN HFA) 108 (90 BASE) MCG/ACT inhaler Inhale 2 puffs into the lungs every 6 (six) hours as needed for wheezing.    Marland Kitchen aspirin 81 MG tablet Take 81 mg by mouth daily.      Marland Kitchen HYDROcodone-acetaminophen (NORCO) 10-325 MG per tablet Take 1 tablet by mouth every 4 (four) hours as needed for pain.    . mirabegron ER (MYRBETRIQ) 25 MG TB24 tablet Take 25 mg by mouth daily.    . pantoprazole (PROTONIX) 40 MG tablet Take 1 tablet (40 mg total) by mouth daily.    Marland Kitchen rOPINIRole (REQUIP) 0.25 MG tablet Take 2 tablets (0.5 mg total) by mouth at bedtime.    . simvastatin (ZOCOR) 10 MG tablet Take 1 tablet (10 mg total) by mouth at bedtime.    .           Review of Systems     Objective:   Physical Exam  Vitals reviewed. Constitutional: He is oriented to person, place, and time. He appears well-nourished. No distress.  HENT:  Head: Normocephalic.  Mouth/Throat: Oropharynx is clear and moist. No oropharyngeal exudate.  Eyes: Pupils are equal, round, and reactive to light. No scleral icterus.  Neck: Normal range of  motion. Neck supple.  Cardiovascular: Normal rate, regular rhythm and normal heart sounds.   Pulmonary/Chest: Effort normal and breath sounds normal.  Abdominal: Soft. Bowel sounds are normal. He exhibits no distension. There is no tenderness.  Musculoskeletal: He exhibits no edema.  Lymphadenopathy:    He has no cervical adenopathy.  Neurological: He is alert and oriented to person, place, and time.  NO  NEW FOCAL DEFICITS   Psychiatric: He has a normal mood and affect.          Assessment & Plan:

## 2013-01-26 NOTE — Assessment & Plan Note (Signed)
RESOLVED. PT GAINED 13 LBS SINCE LAST VISIT.  CONTINUE TO MONITOR SYMPTOMS.

## 2013-01-26 NOTE — Progress Notes (Signed)
cc'd to pcp 

## 2013-01-26 NOTE — Patient Instructions (Signed)
CALL DR. FIELDS IF YOU HAVE TROUBLE SWALLOWING SOLIDS ONE OR TWO TIMES A WEEK. YOU WILL NEED ANOTHER UPPER ENDOSCOPY TO STRETCH YOUR ESOPHAGUS.   FOLLOW A LOW FAT DIET. SEE INFO BELOW.  CONTINUE PROTONIX. TAKE 30 MINUTES PRIOR TO BREAKFAST.  FOLLOW UP IN 1 YEAR.   Low-Fat Diet BREADS, CEREALS, PASTA, RICE, DRIED PEAS, AND BEANS These products are high in carbohydrates and most are low in fat. Therefore, they can be increased in the diet as substitutes for fatty foods. They too, however, contain calories and should not be eaten in excess. Cereals can be eaten for snacks as well as for breakfast.   FRUITS AND VEGETABLES It is good to eat fruits and vegetables. Besides being sources of fiber, both are rich in vitamins and some minerals. They help you get the daily allowances of these nutrients. Fruits and vegetables can be used for snacks and desserts.  MEATS Limit lean meat, chicken, [place], and fish to no more than 6 ounces per day. Beef, Pork, and Lamb Use lean cuts of beef, pork, and lamb. Lean cuts include:  Extra-lean ground beef.  Arm roast.  Sirloin tip.  Center-cut ham.  Round steak.  Loin chops.  Rump roast.  Tenderloin.  Trim all fat off the outside of meats before cooking. It is not necessary to severely decrease the intake of red meat, but lean choices should be made. Lean meat is rich in protein and contains a highly absorbable form of iron. Premenopausal women, in particular, should avoid reducing lean red meat because this could increase the risk for low red blood cells (iron-deficiency anemia).  Chicken and [place] These are good sources of protein. The fat of poultry can be reduced by removing the skin and underlying fat layers before cooking. Chicken and [place] can be substituted for lean red meat in the diet. Poultry should not be fried or covered with high-fat sauces. Fish and Shellfish Fish is a good source of protein. Shellfish contain cholesterol, but they usually  are low in saturated fatty acids. The preparation of fish is important. Like chicken and [place], they should not be fried or covered with high-fat sauces. EGGS Egg whites contain no fat or cholesterol. They can be eaten often. Try 1 to 2 egg whites instead of whole eggs in recipes or use egg substitutes that do not contain yolk. MILK AND DAIRY PRODUCTS Use skim or 1% milk instead of 2% or whole milk. Decrease whole milk, natural, and processed cheeses. Use nonfat or low-fat (2%) cottage cheese or low-fat cheeses made from vegetable oils. Choose nonfat or low-fat (1 to 2%) yogurt. Experiment with evaporated skim milk in recipes that call for heavy cream. Substitute low-fat yogurt or low-fat cottage cheese for sour cream in dips and salad dressings. Have at least 2 servings of low-fat dairy products, such as 2 glasses of skim (or 1%) milk each day to help get your daily calcium intake. FATS AND OILS Reduce the total intake of fats, especially saturated fat. Butterfat, lard, and beef fats are high in saturated fat and cholesterol. These should be avoided as much as possible. Vegetable fats do not contain cholesterol, but certain vegetable fats, such as coconut oil, palm oil, and palm kernel oil are very high in saturated fats. These should be limited. These fats are often used in bakery goods, processed foods, popcorn, oils, and nondairy creamers. Vegetable shortenings and some peanut butters contain hydrogenated oils, which are also saturated fats. Read the labels on these foods  and check for saturated vegetable oils. Unsaturated vegetable oils and fats do not raise blood cholesterol. However, they should be limited because they are fats and are high in calories. Total fat should still be limited to 30% of your daily caloric intake. Desirable liquid vegetable oils are corn oil, cottonseed oil, olive oil, canola oil, safflower oil, soybean oil, and sunflower oil. Peanut oil is not as good, but small amounts are  acceptable. Buy a heart-healthy tub margarine that has no partially hydrogenated oils in the ingredients. Mayonnaise and salad dressings often are made from unsaturated fats, but they should also be limited because of their high calorie and fat content. Seeds, nuts, peanut butter, olives, and avocados are high in fat, but the fat is mainly the unsaturated type. These foods should be limited mainly to avoid excess calories and fat. OTHER EATING TIPS Snacks  Most sweets should be limited as snacks. They tend to be rich in calories and fats, and their caloric content outweighs their nutritional value. Some good choices in snacks are graham crackers, melba toast, soda crackers, bagels (no egg), English muffins, fruits, and vegetables. These snacks are preferable to snack crackers, [place] fries, TORTILLA CHIPS, and POTATO chips. Popcorn should be air-popped or cooked in small amounts of liquid vegetable oil. Desserts Eat fruit, low-fat yogurt, and fruit ices instead of pastries, cake, and cookies. Sherbet, angel food cake, gelatin dessert, frozen low-fat yogurt, or other frozen products that do not contain saturated fat (pure fruit juice bars, frozen ice pops) are also acceptable.  COOKING METHODS Choose those methods that use little or no fat. They include: Poaching.  Braising.  Steaming.  Grilling.  Baking.  Stir-frying.  Broiling.  Microwaving.  Foods can be cooked in a nonstick pan without added fat, or use a nonfat cooking spray in regular cookware. Limit fried foods and avoid frying in saturated fat. Add moisture to lean meats by using water, broth, cooking wines, and other nonfat or low-fat sauces along with the cooking methods mentioned above. Soups and stews should be chilled after cooking. The fat that forms on top after a few hours in the refrigerator should be skimmed off. When preparing meals, avoid using excess salt. Salt can contribute to raising blood pressure in some people.  EATING  AWAY FROM HOME Order entres, potatoes, and vegetables without sauces or butter. When meat exceeds the size of a deck of cards (3 to 4 ounces), the rest can be taken home for another meal. Choose vegetable or fruit salads and ask for low-calorie salad dressings to be served on the side. Use dressings sparingly. Limit high-fat toppings, such as bacon, crumbled eggs, cheese, sunflower seeds, and olives. Ask for heart-healthy tub margarine instead of butter.

## 2013-01-26 NOTE — Assessment & Plan Note (Signed)
SX IMPROVED BUT NOT COMPLETELY RESOLVED.  DISCUSSED MANAGEMENT OPTIONS: 1. REPEAT EGD/DIL NOW OR WAIT UNTIL SX 1-2X/WEEK. PT ELECTED TO WAIT ON EGD/DIL. LOW FAT DIET PROTONIX DAILY FOLLOW UP IN 1 YEAR.

## 2013-01-30 NOTE — Progress Notes (Signed)
Reminder in epic °

## 2013-02-06 ENCOUNTER — Ambulatory Visit (INDEPENDENT_AMBULATORY_CARE_PROVIDER_SITE_OTHER): Payer: PRIVATE HEALTH INSURANCE | Admitting: Family Medicine

## 2013-02-06 ENCOUNTER — Encounter: Payer: Self-pay | Admitting: Family Medicine

## 2013-02-06 VITALS — BP 120/80 | HR 82 | Temp 98.5°F | Resp 18 | Wt 176.0 lb

## 2013-02-06 DIAGNOSIS — M549 Dorsalgia, unspecified: Secondary | ICD-10-CM

## 2013-02-06 DIAGNOSIS — R634 Abnormal weight loss: Secondary | ICD-10-CM

## 2013-02-06 DIAGNOSIS — C61 Malignant neoplasm of prostate: Secondary | ICD-10-CM

## 2013-02-06 DIAGNOSIS — G2581 Restless legs syndrome: Secondary | ICD-10-CM

## 2013-02-06 MED ORDER — HYDROCODONE-ACETAMINOPHEN 10-325 MG PO TABS: 1.0000 | ORAL_TABLET | ORAL | Status: DC | PRN

## 2013-02-06 NOTE — Progress Notes (Signed)
   Subjective:    Patient ID: Cory Foley, male    DOB: September 29, 1950, 62 y.o.   MRN: [id number]  HPI  Pt here to f/u chronic medical problems. He is has continued treatment for his prostate cancer and currently out of disabilty. Restless leg syndrome-8 weeks ago he was started on Requip this does help some of the symptoms but he continues to have some twitching in the left leg. This is also the same leg that he had a bypass in for peripheral arterial disease. Hyperlipidemia-he is tolerating the Zocor without any difficulties  Chronic pain he requests refill on his hydrocodone he uses this for his back pain as well as pelvic and rectal pain from his prostate cancer. Declines flu shot He has gained 5 pounds despite his decreased appetite over the past few months. He was also evaluated by gastroenterology recently note was reviewed   Review of Systems  GEN- denies fatigue, fever, weight loss,weakness, recent illness HEENT- denies eye drainage, change in vision, nasal discharge, CVS- denies chest pain, palpitations RESP- denies SOB, cough, wheeze ABD- denies N/V, change in stools, abd pain GU- denies dysuria, hematuria, dribbling, incontinence MSK- + joint pain, muscle aches, injury Neuro- denies headache, dizziness, syncope, seizure activity      Objective:   Physical Exam GEN- NAD, alert and oriented x3 HEENT- PERRL, EOMI, non injected sclera, pink conjunctiva, MMM, oropharynx clear Neck- Supple,  CVS- RRR, no murmur RESP-CTAB ABD-NABS,soft,NT,ND EXT- No edema Pulses- Radial, DP- 2+        Assessment & Plan:

## 2013-02-06 NOTE — Patient Instructions (Signed)
Increase requip to 3 tablets and bedtime for a few days and see if this helps Continue all other medications F/U 4 months

## 2013-02-06 NOTE — Assessment & Plan Note (Signed)
I will try him on 0.75mg  of Requip at nighttime and see how he does. If he does well with this we will send in a new prescription.

## 2013-02-06 NOTE — Assessment & Plan Note (Signed)
He continues on with hormone lowering treatments. He will have a repeat PSA in February.

## 2013-02-06 NOTE — Assessment & Plan Note (Signed)
He's gained 5 pounds since her last visit. He continues to keep very small meals more like grazing throughout the day and one large meal

## 2013-02-06 NOTE — Assessment & Plan Note (Signed)
Pain medications refilled.

## 2013-02-23 HISTORY — PX: PROSTATE SURGERY: SHX751

## 2013-03-13 ENCOUNTER — Encounter: Payer: Self-pay | Admitting: *Deleted

## 2013-03-13 NOTE — Telephone Encounter (Signed)
This encounter was created in error - please disregard.

## 2013-03-17 ENCOUNTER — Telehealth: Payer: Self-pay | Admitting: *Deleted

## 2013-03-17 DIAGNOSIS — E785 Hyperlipidemia, unspecified: Secondary | ICD-10-CM

## 2013-03-17 MED ORDER — SIMVASTATIN 10 MG PO TABS: 10.0000 mg | ORAL_TABLET | Freq: Every day | ORAL | Status: DC

## 2013-03-17 MED ORDER — ROPINIROLE HCL 0.25 MG PO TABS: 0.7500 mg | ORAL_TABLET | Freq: Every day | ORAL | Status: DC

## 2013-03-17 MED ORDER — HYDROCODONE-ACETAMINOPHEN 10-325 MG PO TABS: 1.0000 | ORAL_TABLET | ORAL | Status: DC | PRN

## 2013-03-17 NOTE — Telephone Encounter (Signed)
Refills on Hydrocodone/apap 10-325mg  last rf 02/06/13, ropinirole 25mg  last refill 03/11/13 (wants 90day supply of 75mg  instead of 25mg  as discussed at office visit; sivastatin 10mg  has 1 refill left good until 04/14/13 but wanted to let you know last refill.

## 2013-03-17 NOTE — Telephone Encounter (Signed)
meds refilled 

## 2013-03-20 ENCOUNTER — Other Ambulatory Visit: Payer: Self-pay | Admitting: *Deleted

## 2013-03-20 MED ORDER — HYDROCODONE-ACETAMINOPHEN 10-325 MG PO TABS: 1.0000 | ORAL_TABLET | ORAL | Status: DC | PRN

## 2013-03-20 NOTE — Telephone Encounter (Signed)
Med reprinted

## 2013-04-27 ENCOUNTER — Other Ambulatory Visit (HOSPITAL_COMMUNITY): Payer: Self-pay | Admitting: Pediatrics

## 2013-04-27 ENCOUNTER — Ambulatory Visit (HOSPITAL_COMMUNITY)
Admission: RE | Admit: 2013-04-27 | Discharge: 2013-04-27 | Disposition: A | Discharge status: Home / Self Care | Payer: Disability Insurance | Source: Ambulatory Visit | Attending: Pediatrics | Admitting: Pediatrics

## 2013-04-27 DIAGNOSIS — M549 Dorsalgia, unspecified: Secondary | ICD-10-CM | POA: Insufficient documentation

## 2013-05-08 ENCOUNTER — Ambulatory Visit: Payer: Self-pay | Admitting: Family

## 2013-05-08 ENCOUNTER — Other Ambulatory Visit (HOSPITAL_COMMUNITY): Payer: PRIVATE HEALTH INSURANCE

## 2013-05-08 ENCOUNTER — Encounter (HOSPITAL_COMMUNITY): Payer: Self-pay

## 2013-05-29 ENCOUNTER — Telehealth: Payer: Self-pay | Admitting: *Deleted

## 2013-05-29 NOTE — Telephone Encounter (Signed)
Received call from patient wife.   Reports that patient requires letter from MD for dentist, Dr. Harrington Challenger, on [address] [phone].  Office is currently closed for Easter. Will reopen on Tuesday, 05/30/2013.

## 2013-05-30 ENCOUNTER — Encounter: Payer: Self-pay | Admitting: *Deleted

## 2013-05-30 ENCOUNTER — Encounter (HOSPITAL_COMMUNITY): Payer: Self-pay

## 2013-05-30 ENCOUNTER — Other Ambulatory Visit (HOSPITAL_COMMUNITY): Payer: PRIVATE HEALTH INSURANCE

## 2013-05-30 ENCOUNTER — Ambulatory Visit: Payer: Self-pay | Admitting: Family

## 2013-05-30 NOTE — Telephone Encounter (Signed)
Letter sent.

## 2013-05-30 NOTE — Telephone Encounter (Signed)
Call placed to dentist office. Magda Paganini, receptionist states that patient requires letter for medical release.  Letter to state that patient is stable enough for dental treatment.  Fax [phone].

## 2013-05-30 NOTE — Telephone Encounter (Signed)
Okay to write a letter stating he is medically cleared for dental surgery. He can hold his aspirin 3 days before a procedure I will sign it

## 2013-06-07 ENCOUNTER — Ambulatory Visit (INDEPENDENT_AMBULATORY_CARE_PROVIDER_SITE_OTHER): Payer: PRIVATE HEALTH INSURANCE | Admitting: Family Medicine

## 2013-06-07 ENCOUNTER — Encounter: Payer: Self-pay | Admitting: Family Medicine

## 2013-06-07 VITALS — BP 132/78 | HR 82 | Temp 98.1°F | Resp 16 | Ht 67.5 in | Wt 179.0 lb

## 2013-06-07 DIAGNOSIS — G4700 Insomnia, unspecified: Secondary | ICD-10-CM

## 2013-06-07 DIAGNOSIS — M8580 Other specified disorders of bone density and structure, unspecified site: Secondary | ICD-10-CM | POA: Insufficient documentation

## 2013-06-07 DIAGNOSIS — R5381 Other malaise: Secondary | ICD-10-CM

## 2013-06-07 DIAGNOSIS — Z23 Encounter for immunization: Secondary | ICD-10-CM

## 2013-06-07 DIAGNOSIS — R7309 Other abnormal glucose: Secondary | ICD-10-CM

## 2013-06-07 DIAGNOSIS — C61 Malignant neoplasm of prostate: Secondary | ICD-10-CM

## 2013-06-07 DIAGNOSIS — R5382 Chronic fatigue, unspecified: Secondary | ICD-10-CM

## 2013-06-07 DIAGNOSIS — R5383 Other fatigue: Secondary | ICD-10-CM

## 2013-06-07 DIAGNOSIS — E785 Hyperlipidemia, unspecified: Secondary | ICD-10-CM

## 2013-06-07 DIAGNOSIS — H9319 Tinnitus, unspecified ear: Secondary | ICD-10-CM | POA: Insufficient documentation

## 2013-06-07 DIAGNOSIS — M949 Disorder of cartilage, unspecified: Secondary | ICD-10-CM

## 2013-06-07 DIAGNOSIS — M899 Disorder of bone, unspecified: Secondary | ICD-10-CM

## 2013-06-07 DIAGNOSIS — M533 Sacrococcygeal disorders, not elsewhere classified: Secondary | ICD-10-CM

## 2013-06-07 DIAGNOSIS — R7302 Impaired glucose tolerance (oral): Secondary | ICD-10-CM

## 2013-06-07 DIAGNOSIS — I739 Peripheral vascular disease, unspecified: Secondary | ICD-10-CM

## 2013-06-07 DIAGNOSIS — J449 Chronic obstructive pulmonary disease, unspecified: Secondary | ICD-10-CM

## 2013-06-07 LAB — CBC WITH DIFFERENTIAL/PLATELET
Basophils Absolute: 0 10*3/uL (ref 0.0–0.1)
Basophils Relative: 0 % (ref 0–1)
Eosinophils Absolute: 0.2 10*3/uL (ref 0.0–0.7)
Eosinophils Relative: 3 % (ref 0–5)
HCT: 40.1 % (ref 39.0–52.0)
Hemoglobin: 13.5 g/dL (ref 13.0–17.0)
Lymphocytes Relative: 23 % (ref 12–46)
Lymphs Abs: 1.2 10*3/uL (ref 0.7–4.0)
MCH: 31.1 pg (ref 26.0–34.0)
MCHC: 33.7 g/dL (ref 30.0–36.0)
MCV: 92.4 fL (ref 78.0–100.0)
Monocytes Absolute: 0.5 10*3/uL (ref 0.1–1.0)
Monocytes Relative: 10 % (ref 3–12)
Neutro Abs: 3.3 10*3/uL (ref 1.7–7.7)
Neutrophils Relative %: 64 % (ref 43–77)
Platelets: 230 10*3/uL (ref 150–400)
RBC: 4.34 MIL/uL (ref 4.22–5.81)
RDW: 14.7 % (ref 11.5–15.5)
WBC: 5.2 10*3/uL (ref 4.0–10.5)

## 2013-06-07 LAB — COMPREHENSIVE METABOLIC PANEL
ALT: 17 U/L (ref 0–53)
AST: 17 U/L (ref 0–37)
Albumin: 4.2 g/dL (ref 3.5–5.2)
Alkaline Phosphatase: 76 U/L (ref 39–117)
BUN: 9 mg/dL (ref 6–23)
CO2: 26 mEq/L (ref 19–32)
Calcium: 9.7 mg/dL (ref 8.4–10.5)
Chloride: 106 mEq/L (ref 96–112)
Creat: 0.78 mg/dL (ref 0.50–1.35)
Glucose, Bld: 87 mg/dL (ref 70–99)
Potassium: 4.7 mEq/L (ref 3.5–5.3)
Sodium: 141 mEq/L (ref 135–145)
Total Bilirubin: 0.4 mg/dL (ref 0.2–1.2)
Total Protein: 6.6 g/dL (ref 6.0–8.3)

## 2013-06-07 LAB — HEMOGLOBIN A1C
Hgb A1c MFr Bld: 6 % — ABNORMAL HIGH (ref <5.7)
Mean Plasma Glucose: 126 mg/dL — ABNORMAL HIGH (ref <117)

## 2013-06-07 LAB — LIPID PANEL
Cholesterol: 199 mg/dL (ref 0–200)
HDL: 48 mg/dL (ref >39)
LDL Cholesterol: 111 mg/dL — ABNORMAL HIGH (ref 0–99)
Total CHOL/HDL Ratio: 4.1 Ratio
Triglycerides: 201 mg/dL — ABNORMAL HIGH (ref <150)
VLDL: 40 mg/dL (ref 0–40)

## 2013-06-07 MED ORDER — HYDROCODONE-ACETAMINOPHEN 10-325 MG PO TABS: 1.0000 | ORAL_TABLET | ORAL | Status: DC | PRN

## 2013-06-07 MED ORDER — PANTOPRAZOLE SODIUM 40 MG PO TBEC
40.0000 mg | DELAYED_RELEASE_TABLET | Freq: Every day | ORAL | Status: DC
Start: 2013-06-07 — End: 2013-12-18

## 2013-06-07 NOTE — Assessment & Plan Note (Signed)
Trial of Advair, given sample , continue albuterol

## 2013-06-07 NOTE — Progress Notes (Signed)
Patient ID: Cory Foley, male   DOB: September 07, 1950, 63 y.o.   MRN: [id number]   Subjective:    Patient ID: Cory Foley, male    DOB: [birth year], 63 y.o.   MRN: [id number]  Patient presents for 4 month F/u, ringing in ears, Pain in tail bone, dental surgery to remove teeth and B leg pain  patient here to followup chronic medical problems here he has multiple concerns. He is currently under treatment for prostate cancer. He also has some bladder incontinence secondary to history and chronic fatigue. The past month he is at increased tone in his tailbone which is where he had pain when he was initially diagnosed with prostate cancer. He has had some bone scans in the past which does not show any metastatic disease.  He was seen by his dentist who told that he had very Foley bones and that he would need to be seen by oral surgery to have his teeth removed. He is currently on amoxicillin for infection.  He's had ringing in his ears for the past 6 weeks although straight. He denies a lot of allergy symptoms. He has some difficulty hearing on the phone due to the ringing in his ears. He denies any ear pain.  Restless leg syndrome-his legs have been doing fairly well with the use of the Requip he typically takes 2-3 tablets as needed. He's not seen any change in his legs since starting the simvastatin for his cholesterol. He also has known peripheral vascular disease.  COPD-mild COPD he has been using his albuterol about 2-3 times a week. Mostly at nighttime when he feels a little short of breath.  He also notes he has had more difficulty sleeping over the past few months since his diagnosis and his treatments. Been out of work causes him to be very irritated and frustrated and he tends to lash out. He wishes that he get back to his old self.  Review Of Systems:  GEN- +fatigue, fever, weight loss,weakness, recent illness HEENT- denies eye drainage, change in vision, nasal discharge, CVS- denies  chest pain, palpitations RESP- denies SOB, cough, wheeze ABD- denies N/V, change in stools, abd pain GU- denies dysuria, hematuria, dribbling, incontinence MSK- + joint pain, +muscle aches, injury Neuro- denies headache, dizziness, syncope, seizure activity       Objective:    BP 132/78  Pulse 82  Temp(Src) 98.1 F (36.7 C) (Oral)  Resp 16  Ht 5' 7.5" (1.715 m)  Wt 179 lb (81.194 kg)  BMI 27.61 kg/m2 GEN- NAD, alert and oriented x3 HEENT- PERRL, EOMI, non injected sclera, pink conjunctiva, MMM, oropharynx clear, TM clear bilat, gingival inflammation Neck- Supple,  CVS- RRR, no murmur RESP-CTAB ABD-NABS,soft,NT,ND EXT- No edema Pulses- Radial, DP- 2+ Psych- flat affect, not anxious appearing, not overly depressed appearing     Assessment & Plan:      Problem List Items Addressed This Visit   Prostate cancer     Followed by urology  Continues to have tailbone pain unchanged    Relevant Medications      HYDROcodone-acetaminophen (NORCO) 10-325 MG per tablet   Other Relevant Orders      PSA   HYPERLIPIDEMIA - Primary     Check LFT and FLP     Relevant Orders      CBC with Differential      Comprehensive metabolic panel      Lipid panel   Glucose intolerance (impaired glucose tolerance)     Check  A1C    Relevant Orders      CBC with Differential      Hemoglobin A1c    Other Visit Diagnoses   Need for prophylactic vaccination against Streptococcus pneumoniae (pneumococcus)        Relevant Orders       Pneumococcal polysaccharide vaccine 23-valent greater than or equal to 2yo subcutaneous/IM (Completed)       Note: This dictation was prepared with Dragon dictation along with smaller phrase technology. Any transcriptional errors that result from this process are unintentional.

## 2013-06-07 NOTE — Assessment & Plan Note (Signed)
Reiterate years noted. He declines the her nose and throat this time. Possible related to some allergies as it only appeared recently. I will have him try an antihistamine at home.

## 2013-06-07 NOTE — Assessment & Plan Note (Signed)
Repeat xray , on pain medications

## 2013-06-07 NOTE — Assessment & Plan Note (Signed)
Try melatonin, declines other medications

## 2013-06-07 NOTE — Assessment & Plan Note (Signed)
Check FLP, on statin drug

## 2013-06-07 NOTE — Assessment & Plan Note (Signed)
Check LFT and FLP

## 2013-06-07 NOTE — Assessment & Plan Note (Signed)
unchanged

## 2013-06-07 NOTE — Patient Instructions (Signed)
Bone Density to be done Xray of tailbone Try the advair  We will call with lab results  Pneumonia vaccine given Get Melatonin for sleep over the counter Try benadryl anti-histamine F/U 3 months

## 2013-06-07 NOTE — Assessment & Plan Note (Signed)
Noted on his oral images Obtain Bone Density

## 2013-06-07 NOTE — Assessment & Plan Note (Signed)
Check A1C 

## 2013-06-07 NOTE — Assessment & Plan Note (Addendum)
Followed by urology  Continues to have tailbone pain - repeat xray

## 2013-06-08 LAB — PSA: PSA: 0.02 ng/mL (ref <=4.00)

## 2013-06-09 ENCOUNTER — Other Ambulatory Visit: Payer: Self-pay | Admitting: Family Medicine

## 2013-06-09 ENCOUNTER — Encounter: Payer: Self-pay | Admitting: *Deleted

## 2013-06-09 DIAGNOSIS — M8580 Other specified disorders of bone density and structure, unspecified site: Secondary | ICD-10-CM

## 2013-06-12 ENCOUNTER — Ambulatory Visit (HOSPITAL_COMMUNITY)
Admission: RE | Admit: 2013-06-12 | Discharge: 2013-06-12 | Disposition: A | Discharge status: Home / Self Care | Payer: PRIVATE HEALTH INSURANCE | Source: Ambulatory Visit | Attending: Family Medicine | Admitting: Family Medicine

## 2013-06-12 DIAGNOSIS — M533 Sacrococcygeal disorders, not elsewhere classified: Secondary | ICD-10-CM

## 2013-06-12 DIAGNOSIS — M545 Low back pain, unspecified: Secondary | ICD-10-CM | POA: Insufficient documentation

## 2013-06-12 DIAGNOSIS — C61 Malignant neoplasm of prostate: Secondary | ICD-10-CM

## 2013-06-15 ENCOUNTER — Ambulatory Visit (HOSPITAL_COMMUNITY)
Admission: RE | Admit: 2013-06-15 | Discharge: 2013-06-15 | Disposition: A | Discharge status: Home / Self Care | Payer: PRIVATE HEALTH INSURANCE | Source: Ambulatory Visit | Attending: Family Medicine | Admitting: Family Medicine

## 2013-06-15 DIAGNOSIS — M8580 Other specified disorders of bone density and structure, unspecified site: Secondary | ICD-10-CM

## 2013-06-15 DIAGNOSIS — M949 Disorder of cartilage, unspecified: Secondary | ICD-10-CM

## 2013-06-15 DIAGNOSIS — C61 Malignant neoplasm of prostate: Secondary | ICD-10-CM | POA: Insufficient documentation

## 2013-06-15 DIAGNOSIS — M899 Disorder of bone, unspecified: Secondary | ICD-10-CM | POA: Insufficient documentation

## 2013-06-20 ENCOUNTER — Encounter: Payer: Self-pay | Admitting: Family Medicine

## 2013-06-20 ENCOUNTER — Other Ambulatory Visit: Payer: Self-pay | Admitting: *Deleted

## 2013-06-20 MED ORDER — ALENDRONATE SODIUM 70 MG PO TABS: 70.0000 mg | ORAL_TABLET | ORAL | Status: DC

## 2013-06-23 ENCOUNTER — Encounter: Payer: Self-pay | Admitting: Family

## 2013-06-26 ENCOUNTER — Ambulatory Visit (HOSPITAL_COMMUNITY)
Admission: RE | Admit: 2013-06-26 | Discharge: 2013-06-26 | Disposition: A | Discharge status: Home / Self Care | Payer: PRIVATE HEALTH INSURANCE | Source: Ambulatory Visit | Attending: Family | Admitting: Family

## 2013-06-26 ENCOUNTER — Ambulatory Visit (INDEPENDENT_AMBULATORY_CARE_PROVIDER_SITE_OTHER)
Admission: RE | Admit: 2013-06-26 | Discharge: 2013-06-26 | Disposition: A | Discharge status: Home / Self Care | Payer: PRIVATE HEALTH INSURANCE | Source: Ambulatory Visit | Attending: Surgery | Admitting: Surgery

## 2013-06-26 ENCOUNTER — Ambulatory Visit (INDEPENDENT_AMBULATORY_CARE_PROVIDER_SITE_OTHER): Payer: PRIVATE HEALTH INSURANCE | Admitting: Family

## 2013-06-26 ENCOUNTER — Encounter: Payer: Self-pay | Admitting: Family

## 2013-06-26 VITALS — BP 105/72 | HR 66 | Resp 16 | Ht 67.5 in | Wt 178.0 lb

## 2013-06-26 DIAGNOSIS — M79609 Pain in unspecified limb: Secondary | ICD-10-CM

## 2013-06-26 DIAGNOSIS — Z48812 Encounter for surgical aftercare following surgery on the circulatory system: Secondary | ICD-10-CM | POA: Insufficient documentation

## 2013-06-26 DIAGNOSIS — I739 Peripheral vascular disease, unspecified: Secondary | ICD-10-CM

## 2013-06-26 NOTE — Patient Instructions (Signed)
Peripheral Vascular Disease Peripheral Vascular Disease (PVD), also called Peripheral Arterial Disease (PAD), is a circulation problem caused by cholesterol (atherosclerotic plaque) deposits in the arteries. PVD commonly occurs in the lower extremities (legs) but it can occur in other areas of the body, such as your arms. The cholesterol buildup in the arteries reduces blood flow which can cause pain and other serious problems. The presence of PVD can place a person at risk for Coronary Artery Disease (CAD).  CAUSES  Causes of PVD can be many. It is usually associated with more than one risk factor such as:   High Cholesterol.  Smoking.  Diabetes.  Lack of exercise or inactivity.  High blood pressure (hypertension).  Obesity.  Family history. SYMPTOMS   When the lower extremities are affected, patients with PVD may experience:  Leg pain with exertion or physical activity. This is called INTERMITTENT CLAUDICATION. This may present as cramping or numbness with physical activity. The location of the pain is associated with the level of blockage. For example, blockage at the abdominal level (distal abdominal aorta) may result in buttock or hip pain. Lower leg arterial blockage may result in calf pain.  As PVD becomes more severe, pain can develop with less physical activity.  In people with severe PVD, leg pain may occur at rest.  Other PVD signs and symptoms:  Leg numbness or weakness.  Coldness in the affected leg or foot, especially when compared to the other leg.  A change in leg color.  Patients with significant PVD are more prone to ulcers or sores on toes, feet or legs. These may take longer to heal or may reoccur. The ulcers or sores can become infected.  If signs and symptoms of PVD are ignored, gangrene may occur. This can result in the loss of toes or loss of an entire limb.  Not all leg pain is related to PVD. Other medical conditions can cause leg pain such  as:  Blood clots (embolism) or Deep Vein Thrombosis.  Inflammation of the blood vessels (vasculitis).  Spinal stenosis. DIAGNOSIS  Diagnosis of PVD can involve several different types of tests. These can include:  Pulse Volume Recording Method (PVR). This test is simple, painless and does not involve the use of X-rays. PVR involves measuring and comparing the blood pressure in the arms and legs. An ABI (Ankle-Brachial Index) is calculated. The normal ratio of blood pressures is 1. As this number becomes smaller, it indicates more severe disease.  < 0.95  indicates significant narrowing in one or more leg vessels.  <0.8 there will usually be pain in the foot, leg or buttock with exercise.  <0.4 will usually have pain in the legs at rest.  <0.25  usually indicates limb threatening PVD.  Doppler detection of pulses in the legs. This test is painless and checks to see if you have a pulses in your legs/feet.  A dye or contrast material (a substance that highlights the blood vessels so they show up on x-ray) may be given to help your caregiver better see the arteries for the following tests. The dye is eliminated from your body by the kidney's. Your caregiver may order blood work to check your kidney function and other laboratory values before the following tests are performed:  Magnetic Resonance Angiography (MRA). An MRA is a picture study of the blood vessels and arteries. The MRA machine uses a large magnet to produce images of the blood vessels.  Computed Tomography Angiography (CTA). A CTA is a   specialized x-ray that looks at how the blood flows in your blood vessels. An IV may be inserted into your arm so contrast dye can be injected.  Angiogram. Is a procedure that uses x-rays to look at your blood vessels. This procedure is minimally invasive, meaning a small incision (cut) is made in your groin. A small tube (catheter) is then inserted into the artery of your groin. The catheter is  guided to the blood vessel or artery your caregiver wants to examine. Contrast dye is injected into the catheter. X-rays are then taken of the blood vessel or artery. After the images are obtained, the catheter is taken out. TREATMENT  Treatment of PVD involves many interventions which may include:  Lifestyle changes:  Quitting smoking.  Exercise.  Following a low fat, low cholesterol diet.  Control of diabetes.  Foot care is very important to the PVD patient. Good foot care can help prevent infection.  Medication:  Cholesterol-lowering medicine.  Blood pressure medicine.  Anti-platelet drugs.  Certain medicines may reduce symptoms of Intermittent Claudication.  Interventional/Surgical options:  Angioplasty. An Angioplasty is a procedure that inflates a balloon in the blocked artery. This opens the blocked artery to improve blood flow.  Stent Implant. A wire mesh tube (stent) is placed in the artery. The stent expands and stays in place, allowing the artery to remain open.  Peripheral Bypass Surgery. This is a surgical procedure that reroutes the blood around a blocked artery to help improve blood flow. This type of procedure may be performed if Angioplasty or stent implants are not an option. SEEK IMMEDIATE MEDICAL CARE IF:   You develop pain or numbness in your arms or legs.  Your arm or leg turns cold, becomes blue in color.  You develop redness, warmth, swelling and pain in your arms or legs. MAKE SURE YOU:   Understand these instructions.  Will watch your condition.  Will get help right away if you are not doing well or get worse. Document Released: 03/19/2004 Document Revised: 05/04/2011 Document Reviewed: 02/14/2008 ExitCare Patient Information 2014 [employer].   Smoking Cessation Quitting smoking is important to your health and has many advantages. However, it is not always easy to quit since nicotine is a very addictive drug. Often times, people try 3  times or more before being able to quit. This document explains the best ways for you to prepare to quit smoking. Quitting takes hard work and a lot of effort, but you can do it. ADVANTAGES OF QUITTING SMOKING  You will live longer, feel better, and live better.  Your body will feel the impact of quitting smoking almost immediately.  Within 20 minutes, blood pressure decreases. Your pulse returns to its normal level.  After 8 hours, carbon monoxide levels in the blood return to normal. Your oxygen level increases.  After 24 hours, the chance of having a heart attack starts to decrease. Your breath, hair, and body stop smelling like smoke.  After 48 hours, damaged nerve endings begin to recover. Your sense of taste and smell improve.  After 72 hours, the body is virtually free of nicotine. Your bronchial tubes relax and breathing becomes easier.  After 2 to 12 weeks, lungs can hold more air. Exercise becomes easier and circulation improves.  The risk of having a heart attack, stroke, cancer, or lung disease is greatly reduced.  After 1 year, the risk of coronary heart disease is cut in half.  After 5 years, the risk of stroke falls to   the same as a nonsmoker.  After 10 years, the risk of lung cancer is cut in half and the risk of other cancers decreases significantly.  After 15 years, the risk of coronary heart disease drops, usually to the level of a nonsmoker.  If you are pregnant, quitting smoking will improve your chances of having a healthy baby.  The people you live with, especially any children, will be healthier.  You will have extra money to spend on things other than cigarettes. QUESTIONS TO THINK ABOUT BEFORE ATTEMPTING TO QUIT You may want to talk about your answers with your caregiver.  Why do you want to quit?  If you tried to quit in the past, what helped and what did not?  What will be the most difficult situations for you after you quit? How will you plan to  handle them?  Who can help you through the tough times? Your family? Friends? A caregiver?  What pleasures do you get from smoking? What ways can you still get pleasure if you quit? Here are some questions to ask your caregiver:  How can you help me to be successful at quitting?  What medicine do you think would be best for me and how should I take it?  What should I do if I need more help?  What is smoking withdrawal like? How can I get information on withdrawal? GET READY  Set a quit date.  Change your environment by getting rid of all cigarettes, ashtrays, matches, and lighters in your home, car, or work. Do not let people smoke in your home.  Review your past attempts to quit. Think about what worked and what did not. GET SUPPORT AND ENCOURAGEMENT You have a better chance of being successful if you have help. You can get support in many ways.  Tell your family, friends, and co-workers that you are going to quit and need their support. Ask them not to smoke around you.  Get individual, group, or telephone counseling and support. Programs are available at local hospitals and health centers. Call your local health department for information about programs in your area.  Spiritual beliefs and practices may help some smokers quit.  Download a "quit meter" on your computer to keep track of quit statistics, such as how long you have gone without smoking, cigarettes not smoked, and money saved.  Get a self-help book about quitting smoking and staying off of tobacco. LEARN NEW SKILLS AND BEHAVIORS  Distract yourself from urges to smoke. Talk to someone, go for a walk, or occupy your time with a task.  Change your normal routine. Take a different route to work. Drink tea instead of coffee. Eat breakfast in a different place.  Reduce your stress. Take a hot bath, exercise, or read a book.  Plan something enjoyable to do every day. Reward yourself for not smoking.  Explore  interactive web-based programs that specialize in helping you quit. GET MEDICINE AND USE IT CORRECTLY Medicines can help you stop smoking and decrease the urge to smoke. Combining medicine with the above behavioral methods and support can greatly increase your chances of successfully quitting smoking.  Nicotine replacement therapy helps deliver nicotine to your body without the negative effects and risks of smoking. Nicotine replacement therapy includes nicotine gum, lozenges, inhalers, nasal sprays, and skin patches. Some may be available over-the-counter and others require a prescription.  Antidepressant medicine helps people abstain from smoking, but how this works is unknown. This medicine is available by prescription.    Nicotinic receptor partial agonist medicine simulates the effect of nicotine in your brain. This medicine is available by prescription. Ask your caregiver for advice about which medicines to use and how to use them based on your health history. Your caregiver will tell you what side effects to look out for if you choose to be on a medicine or therapy. Carefully read the information on the package. Do not use any other product containing nicotine while using a nicotine replacement product.  RELAPSE OR DIFFICULT SITUATIONS Most relapses occur within the first 3 months after quitting. Do not be discouraged if you start smoking again. Remember, most people try several times before finally quitting. You may have symptoms of withdrawal because your body is used to nicotine. You may crave cigarettes, be irritable, feel very hungry, cough often, get headaches, or have difficulty concentrating. The withdrawal symptoms are only temporary. They are strongest when you first quit, but they will go away within 10 14 days. To reduce the chances of relapse, try to:  Avoid drinking alcohol. Drinking lowers your chances of successfully quitting.  Reduce the amount of caffeine you consume. Once you  quit smoking, the amount of caffeine in your body increases and can give you symptoms, such as a rapid heartbeat, sweating, and anxiety.  Avoid smokers because they can make you want to smoke.  Do not let weight gain distract you. Many smokers will gain weight when they quit, usually less than 10 pounds. Eat a healthy diet and stay active. You can always lose the weight gained after you quit.  Find ways to improve your mood other than smoking. FOR MORE INFORMATION  www.smokefree.gov  Document Released: 02/03/2001 Document Revised: 08/11/2011 Document Reviewed: 05/21/2011 [employer] Patient Information 2014 [employer].  

## 2013-06-26 NOTE — Progress Notes (Signed)
[facility] [place] HISTORY AND PHYSICAL -PAD  History of Present Illness Cory Foley is a 63 y.o. male patient of Dr. Trula Slade who is status post left above-knee to below-knee popliteal artery bypass graft with reversed ipsilateral greater saphenous vein on 10/21/2010.  This was done in the setting of an ischemic 5th left toe. 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 He did have issues with swelling postoperatively, he no longer needs compression stockings for this. He reports no further problems with his left fifth toe. He returns today for follow up. He is receiving injections every 6 months as treatment for prostate cancer, has already received and finished radiation treatment.   Pt. denies claudication in legs with walking; he walks a great deal, at least 1-1.5 hours.  Pt denies non healing ulcers on feet or legs.  The patient reports New Medical or Surgical History: had endoscope and colonoscopy recently. In 4 days needs 4 teeth removed.  Pt Diabetic: "borderline" that is facilitated by the injection he receives for prostate cancer.  Pt smoker: smoker  (1/2 ppd, started smoking at age 5 yrs). Has tried Chantix, nicotine patches.   Pt meds include: Statin :Yes ASA: Yes Other anticoagulants/antiplatelets: no  Past Medical History  Diagnosis Date  . SOB (shortness of breath) on exertion   . PVD (peripheral vascular disease)   . Prostate cancer 01/11/12    BX=Adenocarcinoma,Gleason=4+4=8, & 4+5=9,PSA=17.24,Volume= 15.45cc  . Allergy   . BPH with obstruction/lower urinary tract symptoms   . Nocturia   . ED (erectile dysfunction)   . Anxiety   . Arthritis     knees  . COPD, mild   . Osteoporosis   . GERD (gastroesophageal reflux disease)     Social History History  Substance Use Topics  . Smoking status: Current Every Day Smoker -- 0.50 packs/day for 43 years    Types: Cigarettes  . Smokeless tobacco: Never Used     Comment: 1/2  ppd  . Alcohol Use: No     Comment: quit 1.5 years ago    Family History Family History  Problem Relation Age of Onset  . Heart disease Mother     Valve regurgitation and Pacemaker   . Diabetes Mother   . Hyperlipidemia Mother   . Heart disease Father     CABG x 5  . Hyperlipidemia Father   . Hypertension Father   . Heart attack Father   . Heart disease Sister     aortic valve replacement  . Cancer Sister 15    Colon cancer w/ metastasis  . Hypertension Brother     Past Surgical History  Procedure Laterality Date  . Hernia repair      Bilateral inguinal X2  . Joint replacement      bilateral  . Nose surgery    . Knee surgery      Left Knee X 2   and Right knee X1  . Pr vein bypass graft,aorto-fem-pop  10/21/10    Left AK to BK popliteal BPG  . Prostate biopsy  01/11/2012    Adenocarcinoma  . Gold seed implatation  04/28/2012  . Colonoscopy N/A 09/28/2012    Procedure: COLONOSCOPY;  Surgeon: Danie Binder, MD;  Location: [facility];  Service: Endoscopy;  Laterality: N/A;  8:30  . Esophagogastroduodenoscopy N/A 09/28/2012    Procedure: ESOPHAGOGASTRODUODENOSCOPY (EGD);  Surgeon: Danie Binder, MD;  Location: [facility];  Service: Endoscopy;  Laterality: N/A;  . Azzie Almas  dilation N/A 09/28/2012    Procedure: SAVORY DILATION;  Surgeon: Danie Binder, MD;  Location: [facility];  Service: Endoscopy;  Laterality: N/A;  Venia Minks dilation N/A 09/28/2012    Procedure: Venia Minks DILATION;  Surgeon: Danie Binder, MD;  Location: [facility];  Service: Endoscopy;  Laterality: N/A;  . Prostate surgery  2015    Chemo and  Radiation    Allergies  Allergen Reactions  . Codeine Other (See Comments)    Bad headache and sweats  . Morphine And Related Itching    sweats  . Percocet [Oxycodone-Acetaminophen] Other (See Comments)    Headache    Current Outpatient Prescriptions  Medication Sig Dispense Refill  . albuterol (PROVENTIL HFA;VENTOLIN HFA) 108 (90 BASE) MCG/ACT  inhaler Inhale 2 puffs into the lungs every 6 (six) hours as needed for wheezing.  1 Inhaler  0  . alendronate (FOSAMAX) 70 MG tablet Take 1 tablet (70 mg total) by mouth every 7 (seven) days. Take with a full glass of water on an empty stomach.  4 tablet  11  . amoxicillin (AMOXIL) 500 MG capsule Take 500 mg by mouth 3 (three) times daily.      Marland Kitchen aspirin 81 MG tablet Take 81 mg by mouth daily.        . Fluticasone-Salmeterol (ADVAIR) 250-50 MCG/DOSE AEPB Inhale 1 puff into the lungs 2 (two) times daily.      Marland Kitchen HYDROcodone-acetaminophen (NORCO) 10-325 MG per tablet Take 1 tablet by mouth every 4 (four) hours as needed.  120 tablet  0  . pantoprazole (PROTONIX) 40 MG tablet Take 1 tablet (40 mg total) by mouth daily.  30 tablet  5  . rOPINIRole (REQUIP) 0.25 MG tablet Take 3 tablets (0.75 mg total) by mouth at bedtime.  270 tablet  1  . simvastatin (ZOCOR) 10 MG tablet Take 1 tablet (10 mg total) by mouth at bedtime.  90 tablet  1   No current facility-administered medications for this visit.    ROS: See HPI for pertinent positives and negatives.   Physical Examination   Filed Vitals:   06/26/13 1106  BP: 105/72  Pulse: 66  Resp: 16  Height: 5' 7.5" (1.715 m)  Weight: 178 lb (80.74 kg)  SpO2: 95%  Body mass index is 27.45 kg/(m^2).  General: A&O x 3, WDWN. Gait: normal Eyes: PERRLA. Pulmonary: CTAB, without wheezes , rales or rhonchi. Cardiac: regular Rythm , without detected murmur.         Carotid Bruits Left Right   Negative Negative  Aorta is not palpable. Radial pulses: are 3+ palpable ane =                           VASCULAR EXAM: Extremities without ischemic changes  without Gangrene; without open wounds.                                                                                                          LE Pulses LEFT RIGHT  FEMORAL  2+ palpable  1+ palpable        POPLITEAL  not palpable   not palpable       POSTERIOR TIBIAL  2+ palpable   2+  palpable        DORSALIS PEDIS      ANTERIOR TIBIAL 1+ palpable  2+ palpable    Abdomen: soft, NT, no masses. Skin: no rashes, no ulcers noted. Musculoskeletal: no muscle wasting or atrophy.  Neurologic: A&O X 3; Appropriate Affect ; SENSATION: normal; MOTOR FUNCTION:  moving all extremities equally, motor strength 5/5 throughout. Speech is fluent/normal. CN 2-12 intact.   Non-Invasive Vascular Imaging: DATE: 06/26/2013 LOWER EXTREMITY ARTERIAL DUPLEX EVALUATION    INDICATION: Follow up bypass graft     PREVIOUS INTERVENTION(S): Left above knee to below knee popliteal artery bypass graft on 05/04/12    DUPLEX EXAM:     RIGHT  LEFT   Peak Systolic Velocity (cm/s) Ratio (if abnormal) Waveform  Peak Systolic Velocity (cm/s) Ratio (if abnormal) Waveform     Inflow Artery 69  T     Proximal Anastomosis 41  T     Proximal Graft 53  T     Mid Graft 46  T      Distal Graft 47  T     Distal Anastomosis 36  T     Outflow Artery 57  T  1.09 Today's ABI / TBI 1.08  1.19 Previous ABI / TBI (05/04/12 ) 1.10    Waveform:    M - Monophasic       B - Biphasic       T - Triphasic  If Ankle Brachial Index (ABI) or Toe Brachial Index (TBI) performed, please see complete report     ADDITIONAL FINDINGS: No internal vessel narrowing noted within the bypass graft or anastomosis.    IMPRESSION: Patent left leg bypass graft with no evidence of stenosis noted.    Compared to the previous exam:  No significant change when compared to the exam on 05/04/12.      ASSESSMENT: Cory Foley is a 63 y.o. male who is status post left above-knee to below-knee popliteal artery bypass graft with reversed ipsilateral greater saphenous vein on 10/21/2010.  He has no claudication symptoms with walking, no non healing wounds, no further problems with his left fifth toe.   Patent left leg bypass graft with no evidence of stenosis noted. No significant change when compared to the exam on 05/04/12. ABI's in both  legs remain normal with triphasic waveforms. Unfortunately he continues to smoke and was counseled re smoking cessation.  PLAN:  I discussed in depth with the patient the nature of atherosclerosis, and emphasized the importance of maximal medical management including strict control of blood pressure, blood glucose, and lipid levels, obtaining regular exercise, and cessation of smoking.  The patient is aware that without maximal medical management the underlying atherosclerotic disease process will progress, limiting the benefit of any interventions.  Based on the patient's vascular studies and examination, pt will return to clinic in 1 year for ABI's and left LE arterial Duplex.  The patient was given information about PAD including signs, symptoms, treatment, what symptoms should prompt the patient to seek immediate medical care, and risk reduction measures to take.  Clemon Chambers, RN, MSN, FNP-C [facility] Phone: [phone]  Clinic MD: Trula Slade  06/26/2013 11:22 AM

## 2013-07-31 ENCOUNTER — Telehealth: Payer: Self-pay | Admitting: Family Medicine

## 2013-07-31 MED ORDER — HYDROCODONE-ACETAMINOPHEN 10-325 MG PO TABS: 1.0000 | ORAL_TABLET | ORAL | Status: DC | PRN

## 2013-07-31 NOTE — Telephone Encounter (Signed)
Call back number [phone] PT is needing HYDROcodone-acetaminophen (NORCO) 10-325 MG per tablet

## 2013-07-31 NOTE — Telephone Encounter (Signed)
Ok to refill??  Last office visit/ refill 06/07/2013.

## 2013-07-31 NOTE — Telephone Encounter (Signed)
Prescription printed and patient made aware to come to office to pick up.  

## 2013-07-31 NOTE — Telephone Encounter (Signed)
Okay to refill? 

## 2013-08-09 ENCOUNTER — Ambulatory Visit (INDEPENDENT_AMBULATORY_CARE_PROVIDER_SITE_OTHER): Payer: PRIVATE HEALTH INSURANCE | Admitting: Family Medicine

## 2013-08-09 ENCOUNTER — Encounter: Payer: Self-pay | Admitting: Family Medicine

## 2013-08-09 VITALS — BP 138/80 | HR 78 | Temp 98.0°F | Resp 16 | Ht 67.5 in | Wt 177.0 lb

## 2013-08-09 DIAGNOSIS — G459 Transient cerebral ischemic attack, unspecified: Secondary | ICD-10-CM

## 2013-08-09 DIAGNOSIS — R42 Dizziness and giddiness: Secondary | ICD-10-CM

## 2013-08-09 DIAGNOSIS — H109 Unspecified conjunctivitis: Secondary | ICD-10-CM

## 2013-08-09 DIAGNOSIS — H02409 Unspecified ptosis of unspecified eyelid: Secondary | ICD-10-CM

## 2013-08-09 DIAGNOSIS — R51 Headache: Secondary | ICD-10-CM

## 2013-08-09 MED ORDER — POLYMYXIN B-TRIMETHOPRIM 10000-0.1 UNIT/ML-% OP SOLN: 1.0000 [drp] | Freq: Four times a day (QID) | OPHTHALMIC | Status: DC

## 2013-08-09 NOTE — Progress Notes (Signed)
Patient ID: Cory Foley, male   DOB: 08-Jun-1950, 63 y.o.   MRN: [id number]   Subjective:    Patient ID: Cory Foley, male    DOB: October 23, 1950, 63 y.o.   MRN: [id number]  Patient presents for R eye pressure and swelling  patient here with headaches and feeling off balance for the past 4 weeks. His headache is located in the occipital region and radiates up to the frontal region in his temples. Mild photophobia associated no nausea vomiting. His wife notes that he looks drunk when he is walker in the house and he is very weak when they occur. The only thing that helps is his pain medication he often just lays down most of the day. His headaches are starting to worsen .  Tthe past 3 days he has had right eye pressure and a tingling sensation. He also noted some redness of his sclera and he has an irritation when he moves his eye around though he does not have any change in his vision there is been no rash or redness on his eyelid or face. He does note that his right eyelid has been drooping some the past couple days it looked like an improved yesterday evening after he woke up from a nap but it is back to drooping today it has been unchanged. He denies any recent chest pain or change in his dyspnea. He is still under care for his prostate cancer  Note history of Bells Palsy   Review Of Systems:  GEN- +fatigue, fever, weight loss,weakness, recent illness HEENT- denies eye drainage, change in vision, nasal discharge, CVS- denies chest pain, palpitations RESP- denies SOB, cough, wheeze ABD- denies N/V, change in stools, abd pain GU- denies dysuria, hematuria, dribbling, incontinence MSK- denies joint pain, muscle aches, injury Neuro- +headache,+ dizziness, syncope, seizure activity       Objective:    BP 138/80  Pulse 78  Temp(Src) 98 F (36.7 C) (Oral)  Resp 16  Ht 5' 7.5" (1.715 m)  Wt 177 lb (80.287 kg)  BMI 27.30 kg/m2 GEN- NAD, alert and oriented x3 HEENT- PERRL, EOMI, non  injected sclera, pink conjunctiva, MMM, oropharynx clear Neck- Supple, fair ROM CVS- RRR, no murmur RESP-CTAB Neuro- CNII-XII in tact, generalized weakness, slight droop to right side of face  EXT- No edema Pulses- Radial, DP- 2+        Assessment & Plan:      Problem List Items Addressed This Visit   None    Visit Diagnoses   Headache(784.0)    -  Primary    Risk factors for stroke include tobacco use as well as high cholesterol family hx, pain meds were refilled    Relevant Medications       topiramate (TOPAMAX) tablet    Other Relevant Orders       MR Brain Wo Contrast (Completed)    Drooping eyelid        He has a trip in late however he also has signs of conjunctivitis therefore we'll treat this as well, as this may be cause of droop    Dizziness and giddiness        Relevant Orders       MR Brain Wo Contrast (Completed)    TIA (transient ischemic attack)        I'm concerned based on his symptoms for possible TIA or other CVA. He needs imaging of his brain. Hx cancer    Relevant Orders  MR Brain Wo Contrast (Completed)    Conjunctivitis of right eye           Note: This dictation was prepared with Dragon dictation along with smaller phrase technology. Any transcriptional errors that result from this process are unintentional.    NOTE HE DECLINES GOING TODAY FOR IMAGING, STATES HE WILL GO TOMORROW

## 2013-08-09 NOTE — Patient Instructions (Signed)
MRI of brain to be done Start eye drops We will call with results

## 2013-08-10 ENCOUNTER — Other Ambulatory Visit: Payer: Self-pay | Admitting: *Deleted

## 2013-08-10 ENCOUNTER — Ambulatory Visit (HOSPITAL_COMMUNITY)
Admission: RE | Admit: 2013-08-10 | Discharge: 2013-08-10 | Disposition: A | Discharge status: Home / Self Care | Payer: PRIVATE HEALTH INSURANCE | Source: Ambulatory Visit | Attending: Family Medicine | Admitting: Family Medicine

## 2013-08-10 DIAGNOSIS — R29810 Facial weakness: Secondary | ICD-10-CM | POA: Insufficient documentation

## 2013-08-10 DIAGNOSIS — Z8546 Personal history of malignant neoplasm of prostate: Secondary | ICD-10-CM | POA: Insufficient documentation

## 2013-08-10 DIAGNOSIS — R42 Dizziness and giddiness: Secondary | ICD-10-CM | POA: Insufficient documentation

## 2013-08-10 DIAGNOSIS — R51 Headache: Secondary | ICD-10-CM

## 2013-08-10 DIAGNOSIS — G459 Transient cerebral ischemic attack, unspecified: Secondary | ICD-10-CM

## 2013-08-10 MED ORDER — TOPIRAMATE 25 MG PO TABS: 50.0000 mg | ORAL_TABLET | Freq: Every day | ORAL | Status: DC

## 2013-08-12 ENCOUNTER — Encounter: Payer: Self-pay | Admitting: Family Medicine

## 2013-08-17 ENCOUNTER — Telehealth: Payer: Self-pay | Admitting: *Deleted

## 2013-08-17 NOTE — Telephone Encounter (Signed)
Received call from patient wife.   Reports that he has been to 3 eye exams, with 2 different eye doctors. Reports that no cause of irritation found, and only dx is irritated eye.   States that he has been given 3 different medicated drops, but all are ineffective.   Patient wife just wanted to make MD aware.

## 2013-08-18 ENCOUNTER — Ambulatory Visit (INDEPENDENT_AMBULATORY_CARE_PROVIDER_SITE_OTHER): Payer: PRIVATE HEALTH INSURANCE | Admitting: Family Medicine

## 2013-08-18 ENCOUNTER — Encounter: Payer: Self-pay | Admitting: Family Medicine

## 2013-08-18 VITALS — BP 132/78 | HR 80 | Resp 16 | Ht 67.5 in | Wt 175.0 lb

## 2013-08-18 DIAGNOSIS — C61 Malignant neoplasm of prostate: Secondary | ICD-10-CM

## 2013-08-18 DIAGNOSIS — R42 Dizziness and giddiness: Secondary | ICD-10-CM

## 2013-08-18 DIAGNOSIS — R51 Headache: Secondary | ICD-10-CM

## 2013-08-18 DIAGNOSIS — R519 Headache, unspecified: Secondary | ICD-10-CM | POA: Insufficient documentation

## 2013-08-18 MED ORDER — METHYLPREDNISOLONE (PAK) 4 MG PO TABS: ORAL_TABLET | ORAL | Status: DC

## 2013-08-18 NOTE — Progress Notes (Signed)
Patient ID: Cory Foley, male   DOB: 1950/08/10, 63 y.o.   MRN: [id number]   Subjective:    Patient ID: Cory Foley, male    DOB: 05-03-1950, 63 y.o.   MRN: [id number]  Patient presents for Headache  patient here for interim followup visit. Her last visit he had swelling of his right eye, redness as well as drooping as well as severe headache. With his history of being off balance as well as prostate cancer MRI was done concerning for intracranial abnormality or TIA which showed abnormal signal intensity consistent with possible migraines there was no evidence of any periorbital cellulitis. There was no brain tumor mass. His eye continued to swell therefore he went to the ophthalmologist to prescribe him a prednisone drop was told that he had blisters on the eye as well as episcleritis, his redness has improved in the blisters resolved they do not think that this was due to zoster. He was then referred to another ophthalmologist in [place] as he continued to have the pain near the eye he was told there was nothing wrong with his eye and was given another drop which has not helped. He continues to take pain medication around the clock he gets severe headaches at the temporal region. He did start Topamax and is currently at 50 mg at bedtime which she's been on for the past 3 days. He's not had any nausea vomiting but his appetite is still decreased as he does not want to eat when his head hurts.    Review Of Systems:  GEN- + fatigue, fever, weight loss,weakness, recent illness HEENT- denies eye drainage, change in vision, nasal discharge, CVS- denies chest pain, palpitations RESP- denies SOB, cough, wheeze ABD- denies N/V, change in stools, abd pain GU- denies dysuria, hematuria, dribbling, incontinence MSK- denies joint pain, muscle aches, injury Neuro- + headache, +dizziness, syncope, seizure activity       Objective:    BP 132/78  Pulse 80  Resp 16  Ht 5' 7.5" (1.715 m)  Wt 175  lb (79.379 kg)  BMI 26.99 kg/m2 GEN- NAD, alert and oriented x3 HEENT- PERRL, EOMI, non injected sclera, pink conjunctiva, MMM, oropharynx clear Neck- Supple, fair ROM CVS- RRR, no murmur RESP-CTAB Neuro- CNII-XII in tact, generalized weakness, slight droop to right side of face , minimal swelling of right upper lid EXT- No edema Pulses- Radial 2+       Assessment & Plan:      Problem List Items Addressed This Visit   None      Note: This dictation was prepared with Dragon dictation along with smaller phrase technology. Any transcriptional errors that result from this process are unintentional.

## 2013-08-18 NOTE — Telephone Encounter (Signed)
Call placed to patient to make aware. LMTRC. 

## 2013-08-18 NOTE — Patient Instructions (Signed)
Continue topomax Referral to neurology Start Medrol dosepak  F/U as previous

## 2013-08-18 NOTE — Telephone Encounter (Signed)
Pt wife called back and is aware can come in today for 2p appt.

## 2013-08-18 NOTE — Assessment & Plan Note (Signed)
His MRI was concern for possible migraine disorder causing the headaches there is a possibility of temporal arteritis versus trigeminal neuralgia but no think that these quite explain the swelling of the eyelid and the drooping that he had. It seems that any eye pathology has been ruled out. I will refer him to neurology for further evaluation he will continue the Topamax to 50 mg he also has hydrocodone which helps his pain. I will also put him on a Medrol Dosepak

## 2013-08-18 NOTE — Assessment & Plan Note (Signed)
Will refer him to neurology for evaluation

## 2013-08-18 NOTE — Telephone Encounter (Signed)
Have him schedule a recheck in the office, he can come at 2pm today

## 2013-08-29 ENCOUNTER — Encounter: Payer: Self-pay | Admitting: Diagnostic Neuroimaging

## 2013-08-29 ENCOUNTER — Ambulatory Visit (INDEPENDENT_AMBULATORY_CARE_PROVIDER_SITE_OTHER): Payer: PRIVATE HEALTH INSURANCE | Admitting: Diagnostic Neuroimaging

## 2013-08-29 VITALS — BP 120/77 | HR 64 | Temp 97.0°F | Ht 67.0 in | Wt 174.2 lb

## 2013-08-29 DIAGNOSIS — H02409 Unspecified ptosis of unspecified eyelid: Secondary | ICD-10-CM

## 2013-08-29 DIAGNOSIS — H02401 Unspecified ptosis of right eyelid: Secondary | ICD-10-CM

## 2013-08-29 DIAGNOSIS — H579 Unspecified disorder of eye and adnexa: Secondary | ICD-10-CM

## 2013-08-29 DIAGNOSIS — R51 Headache: Secondary | ICD-10-CM

## 2013-08-29 DIAGNOSIS — H5789 Other specified disorders of eye and adnexa: Secondary | ICD-10-CM

## 2013-08-29 MED ORDER — TOPIRAMATE 50 MG PO TABS: 50.0000 mg | ORAL_TABLET | Freq: Two times a day (BID) | ORAL | Status: DC

## 2013-08-29 MED ORDER — INDOMETHACIN ER 75 MG PO CPCR: 75.0000 mg | ORAL_CAPSULE | Freq: Two times a day (BID) | ORAL | Status: DC

## 2013-08-29 NOTE — Progress Notes (Signed)
[facility]  PATIENT: Cory Foley DOB: 02/24/1950  REFERRING CLINICIAN: [place] HISTORY FROM: patient  REASON FOR VISIT: new consult   HISTORICAL  CHIEF COMPLAINT:  No chief complaint on file.   HISTORY OF PRESENT ILLNESS:   63 year old right-handed male with high cholesterol migraine prostate cancer here for evaluation of headaches and dizziness.  5-6 weeks ago patient began severe frontal forehead headache with pressure behind the right eye. He went to see the eye doctor a was found to have blisters on his eyelid and given some drops. However headache and eye pain have persisted. No double vision, photophobia phonophobia nausea or vomiting. Patient was on topiramate for past 3 weeks without relief. He was treated with prednisone Dosepak which improved symptoms. After Dosepak was complete headaches return. Sometimes patient feels like he is walking on a slant. He has been having some ringing in the ears. Symptoms last hours or a whole day at a time.   REVIEW OF SYSTEMS: Full 14 system review of systems performed and notable only for now and asleep decreased energy disinterest in activities dizziness restless legs headache memory loss feeling hot and cold shortness of breath cough wheezing urination problem impotence remaining her fatigue I pain.  ALLERGIES: Allergies  Allergen Reactions  . Codeine Other (See Comments)    Bad headache and sweats  . Morphine And Related Itching    sweats  . Percocet [Oxycodone-Acetaminophen] Other (See Comments)    Headache    HOME MEDICATIONS: Outpatient Prescriptions Prior to Visit  Medication Sig Dispense Refill  . albuterol (PROVENTIL HFA;VENTOLIN HFA) 108 (90 BASE) MCG/ACT inhaler Inhale 2 puffs into the lungs every 6 (six) hours as needed for wheezing.  1 Inhaler  0  . alendronate (FOSAMAX) 70 MG tablet Take 1 tablet (70 mg total) by mouth every 7 (seven) days. Take with a full glass of water on an empty stomach.   4 tablet  11  . aspirin 81 MG tablet Take 81 mg by mouth daily.        . Fluticasone-Salmeterol (ADVAIR) 250-50 MCG/DOSE AEPB Inhale 1 puff into the lungs 2 (two) times daily.      Marland Kitchen HYDROcodone-acetaminophen (NORCO) 10-325 MG per tablet Take 1 tablet by mouth every 4 (four) hours as needed.  120 tablet  0  . methylPREDNIsolone (MEDROL DOSPACK) 4 MG tablet follow package directions  21 tablet  0  . pantoprazole (PROTONIX) 40 MG tablet Take 1 tablet (40 mg total) by mouth daily.  30 tablet  5  . rOPINIRole (REQUIP) 0.25 MG tablet Take 3 tablets (0.75 mg total) by mouth at bedtime.  270 tablet  1  . simvastatin (ZOCOR) 10 MG tablet Take 1 tablet (10 mg total) by mouth at bedtime.  90 tablet  1  . trimethoprim-polymyxin b (POLYTRIM) ophthalmic solution Place 1 drop into the left eye 4 (four) times daily. FOR 7 DAYS  10 mL  0  . topiramate (TOPAMAX) 25 MG tablet Take 2 tablets (50 mg total) by mouth daily.  60 tablet  2   No facility-administered medications prior to visit.    PAST MEDICAL HISTORY: Past Medical History  Diagnosis Date  . SOB (shortness of breath) on exertion   . PVD (peripheral vascular disease)   . Prostate cancer 01/11/12    BX=Adenocarcinoma,Gleason=4+4=8, & 4+5=9,PSA=17.24,Volume= 15.45cc  . Allergy   . BPH with obstruction/lower urinary tract symptoms   . Nocturia   . ED (erectile dysfunction)   . Anxiety   .  Arthritis     knees  . COPD, mild   . Osteoporosis   . GERD (gastroesophageal reflux disease)     PAST SURGICAL HISTORY: Past Surgical History  Procedure Laterality Date  . Hernia repair      Bilateral inguinal X2  . Joint replacement      bilateral  . Nose surgery    . Knee surgery      Left Knee X 2   and Right knee X1  . Pr vein bypass graft,aorto-fem-pop  10/21/10    Left AK to BK popliteal BPG  . Prostate biopsy  01/11/2012    Adenocarcinoma  . Gold seed implatation  04/28/2012  . Colonoscopy N/A 09/28/2012    Procedure: COLONOSCOPY;   Surgeon: Danie Binder, MD;  [facility]: [facility];  Service: Endoscopy;  Laterality: N/A;  8:30  . Esophagogastroduodenoscopy N/A 09/28/2012    Procedure: ESOPHAGOGASTRODUODENOSCOPY (EGD);  Surgeon: Danie Binder, MD;  Location: [facility];  Service: Endoscopy;  Laterality: N/A;  . Savory dilation N/A 09/28/2012    Procedure: SAVORY DILATION;  Surgeon: Danie Binder, MD;  Location: [facility];  Service: Endoscopy;  Laterality: N/A;  Venia Minks dilation N/A 09/28/2012    Procedure: Venia Minks DILATION;  Surgeon: Danie Binder, MD;  Location: [facility];  Service: Endoscopy;  Laterality: N/A;  . Prostate surgery  2015    Chemo and  Radiation    FAMILY HISTORY: Family History  Problem Relation Age of Onset  . Heart disease Mother     Valve regurgitation and Pacemaker   . Diabetes Mother   . Hyperlipidemia Mother   . Heart disease Father     CABG x 5  . Hyperlipidemia Father   . Hypertension Father   . Heart attack Father   . Heart disease Sister     aortic valve replacement  . Cancer Sister 44    Colon cancer w/ metastasis  . Hypertension Brother     SOCIAL HISTORY:  History   Social History  . Marital Status: Married    Spouse Name: Glenda    Number of Children: 2  . Years of Education: College   Occupational History  .      [employer]"  .  Other    disability   Social History Main Topics  . Smoking status: Current Every Day Smoker -- 0.50 packs/day for 43 years    Types: Cigarettes  . Smokeless tobacco: Never Used     Comment: 1/2 ppd  . Alcohol Use: No     Comment: quit 1.5 years ago  . Drug Use: No  . Sexual Activity: Not Currently   Other Topics Concern  . Not on file   Social History Narrative   Patient lives at home with spouse.   Caffeine use: very little     PHYSICAL EXAM  Filed Vitals:   08/29/13 0850 08/29/13 0858  BP: 120/76 120/77  Pulse: 56 64  Temp: 97 F (36.1 C)   TempSrc: Oral   Height: 5\' 7"  (1.702 m)   Weight:  174 lb 3.2 oz (79.017 kg)     Not recorded    Body mass index is 27.28 kg/(m^2).  GENERAL EXAM: Patient is in no distress; well developed, nourished and groomed; neck is supple  CARDIOVASCULAR: Regular rate and rhythm, no murmurs, no carotid bruits  NEUROLOGIC: MENTAL STATUS: awake, alert, oriented to person, place and time, recent and remote memory intact, normal attention and concentration,  language fluent, comprehension intact, naming intact, fund of knowledge appropriate CRANIAL NERVE: no papilledema on fundoscopic exam, pupils equal and reactive to light, visual fields full to confrontation, extraocular muscles intact, no nystagmus, facial sensation symmetric, RIGHT PTOSIS, RIGHT SCLERAL INJECTION, RIGHT FACE SYNKINESIS ON EYE CLOSURE, SLIGHTLY DECR RIGHT NL FOLD ON SMILE (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 Hearing intact, palate elevates symmetrically, uvula midline, shoulder shrug symmetric, tongue midline. MOTOR: normal bulk and tone, full strength in the BUE, BLE SENSORY: normal and symmetric to light touch, pinprick, temperature, vibration; DECR IN LEFT LEG COORDINATION: finger-nose-finger, fine finger movements normal REFLEXES: BUE 1, KNEES 1, ANKLES 0 GAIT/STATION: narrow based gait; able to walk on toes, heels; romberg is negative    DIAGNOSTIC DATA (LABS, IMAGING, TESTING) - I reviewed patient records, labs, notes, testing and imaging myself where available.  Lab Results  Component Value Date   WBC 5.2 06/07/2013   HGB 13.5 06/07/2013   HCT 40.1 06/07/2013   MCV 92.4 06/07/2013   PLT 230 06/07/2013      Component Value Date/Time   NA 141 06/07/2013 0926   K 4.7 06/07/2013 0926   CL 106 06/07/2013 0926   CO2 26 06/07/2013 0926   GLUCOSE 87 06/07/2013 0926   BUN 9 06/07/2013 0926   CREATININE 0.78 06/07/2013 0926   CREATININE 0.82 02/22/2012 1159   CALCIUM 9.7 06/07/2013 0926   PROT 6.6 06/07/2013 0926   ALBUMIN 4.2 06/07/2013 0926   AST 17 06/07/2013 0926   ALT 17 06/07/2013 0926     ALKPHOS 76 06/07/2013 0926   BILITOT 0.4 06/07/2013 0926   GFRNONAA >90 02/22/2012 1159   GFRAA >90 02/22/2012 1159   Lab Results  Component Value Date   CHOL 199 06/07/2013   HDL 48 06/07/2013   LDLCALC 111* 06/07/2013   TRIG 201* 06/07/2013   CHOLHDL 4.1 06/07/2013   Lab Results  Component Value Date   HGBA1C 6.0* 06/07/2013   No results found for this basename: VITAMINB12   Lab Results  Component Value Date   TSH 0.973 08/24/2012    I reviewed images myself and agree with interpretation. -VRP  08/10/13 MRI brain 1. No evidence of acute intracranial abnormality.  2. Multiple foci of white matter T2 signal abnormality, greater than expected for age and nonspecific. This is most commonly seen in the setting of chronic small vessel ischemia, with other considerations including sequelae of migraines, vasculitis, prior infection, or less likely demyelination.   ASSESSMENT AND PLAN  63 y.o. year old male here with new onset headache and right eye pain and scleral injection in the last 5-6 weeks.  Ddx: trigeminal autonomic headache, tolosa hunt syndrome, cavernous sinus thrombosis, carotid cavernous fistula  PLAN: - indomethacin 75mg  BID + topiramate 50mg  BID - MRI, MRA, labs  Orders Placed This Encounter  Procedures  . MR Brain W Contrast  . MR MRA HEAD WO CONTRAST  . Sedimentation Rate  . C-reactive Protein  . Acetylcholine Receptor, Binding    Meds ordered this encounter  Medications  . topiramate (TOPAMAX) 50 MG tablet    Sig: Take 1 tablet (50 mg total) by mouth 2 (two) times daily.    Dispense:  60 tablet    Refill:  6  . indomethacin (INDOCIN SR) 75 MG CR capsule    Sig: Take 1 capsule (75 mg total) by mouth 2 (two) times daily with a meal.    Dispense:  60 capsule    Refill:  1    Return  in about 1 month (around 09/29/2013).    Penni Bombard, MD 3/0/1601, 0:93 AM Certified in Neurology, Neurophysiology and Neuroimaging  [facility] [address], [place], [place] [address] [phone]

## 2013-08-29 NOTE — Patient Instructions (Addendum)
I will check MRI, MRA scans and labs.

## 2013-08-30 ENCOUNTER — Telehealth: Payer: Self-pay | Admitting: *Deleted

## 2013-08-30 LAB — ACETYLCHOLINE RECEPTOR, BINDING: AChR Binding Ab, Serum: <0.03 nmol/L (ref 0.00–0.24)

## 2013-08-30 LAB — C-REACTIVE PROTEIN: CRP: 12 mg/L — ABNORMAL HIGH (ref 0.0–4.9)

## 2013-08-30 LAB — SEDIMENTATION RATE: Sed Rate: 15 mm/hr (ref 0–30)

## 2013-08-30 NOTE — Telephone Encounter (Signed)
Call placed to patient to make aware. LMTRC. 

## 2013-08-30 NOTE — Telephone Encounter (Signed)
Received call from patient wife, Cory Foley.   Reports that patient went to neurology and was prescribed new medication Indomethacin. States that patient was told by MD to not take ibuprofen containing medications.   Wanted to make sure it would be ok to take this new medication.   MD please advise.

## 2013-08-30 NOTE — Telephone Encounter (Signed)
He can take for no longer than 2 weeks because of his previous gastritis history If must eat with medication If he gets abdominal pain, bloating- stop

## 2013-08-31 NOTE — Telephone Encounter (Signed)
Patient wife returned call and made aware.  

## 2013-09-06 ENCOUNTER — Encounter: Payer: Self-pay | Admitting: Family Medicine

## 2013-09-06 ENCOUNTER — Ambulatory Visit (INDEPENDENT_AMBULATORY_CARE_PROVIDER_SITE_OTHER): Payer: PRIVATE HEALTH INSURANCE | Admitting: Family Medicine

## 2013-09-06 VITALS — BP 128/80 | HR 78 | Temp 97.5°F | Resp 14 | Ht 67.0 in | Wt 171.0 lb

## 2013-09-06 DIAGNOSIS — R7309 Other abnormal glucose: Secondary | ICD-10-CM

## 2013-09-06 DIAGNOSIS — R7302 Impaired glucose tolerance (oral): Secondary | ICD-10-CM

## 2013-09-06 DIAGNOSIS — C61 Malignant neoplasm of prostate: Secondary | ICD-10-CM

## 2013-09-06 DIAGNOSIS — E785 Hyperlipidemia, unspecified: Secondary | ICD-10-CM

## 2013-09-06 DIAGNOSIS — R51 Headache: Secondary | ICD-10-CM

## 2013-09-06 DIAGNOSIS — F17200 Nicotine dependence, unspecified, uncomplicated: Secondary | ICD-10-CM

## 2013-09-06 MED ORDER — METHYLPREDNISOLONE (PAK) 4 MG PO TABS: ORAL_TABLET | ORAL | Status: DC

## 2013-09-06 MED ORDER — HYDROCODONE-ACETAMINOPHEN 10-325 MG PO TABS: 1.0000 | ORAL_TABLET | ORAL | Status: DC | PRN

## 2013-09-06 MED ORDER — SIMVASTATIN 10 MG PO TABS: 10.0000 mg | ORAL_TABLET | Freq: Every day | ORAL | Status: DC

## 2013-09-06 NOTE — Assessment & Plan Note (Signed)
Complete treatments, obtain PSA

## 2013-09-06 NOTE — Progress Notes (Addendum)
Patient ID: Cory Foley, male   DOB: 1950/05/13, 63 y.o.   MRN: [id number]   Subjective:    Patient ID: Cory Foley, male    DOB: [birth year], 63 y.o.   MRN: [id number]  Patient presents for 3 month F/U  patient here to follow chronic medical problems. History of prostate cancer he is still has one more injection to get. His last PSA was 3 months ago was normal he was on an every three-month cycle with his PSA however he does not have an appointment with his urology for another 2 months. He also is history of hyperlipidemia is currently on statin drug and is due for repeat lipid panel as well as an A1c due to some glucose intolerance.  Her last visit he was seen secondary to severe right-sided migraine as well as left eyelid drooping and numbness there was concern for stroke however MRI did not show any evidence of cerebrovascular event. He was seen by ophthalmology as well which no specific diagnosis could be given I did refer him to neurology and this is currently being worked up. He is having an MRA done tomorrow. He is on Topamax and this helps ease the pain some but he continues to have daily headaches on that right side worse at night. He did try to take the indomethacin however this caused severe gastritis which he's had in the past therefore he discontinued. The Medrol Dosepak he was on was the only pain reliever he did not have any headaches at all while taking ait    Review Of Systems:  GEN- denies fatigue, fever, weight loss,weakness, recent illness HEENT- denies eye drainage, change in vision, nasal discharge, CVS- denies chest pain, palpitations RESP- denies SOB, cough, wheeze ABD- denies N/V, change in stools, abd pain GU- denies dysuria, hematuria, dribbling, incontinence MSK- denies joint pain, muscle aches, injury Neuro- denies headache, dizziness, syncope, seizure activity       Objective:    BP 128/80  Pulse 78  Temp(Src) 97.5 F (36.4 C) (Oral)  Resp 14  Ht 5'  7" (1.702 m)  Wt 171 lb (77.565 kg)  BMI 26.78 kg/m2 GEN- NAD, alert and oriented x3 HEENT- PERRL, EOMI, non injected sclera, ptosis right eye pink conjunctiva, MMM, oropharynx clear Neck- Supple, no LAD CVS- RRR, no murmur RESP-few scattered wheeze, normal WOB, no rhonchi EXT- No edema Pulses- Radial 2+        Assessment & Plan:      Problem List Items Addressed This Visit   Prostate cancer   Relevant Medications      methylPREDNIsolone (MEDROL DOSPACK) 4 MG tablet      HYDROcodone-acetaminophen (NORCO) 10-325 MG per tablet   Other Relevant Orders      PSA   HYPERLIPIDEMIA - Primary   Relevant Medications      simvastatin (ZOCOR) tablet   Other Relevant Orders      Comprehensive metabolic panel      Lipid panel   Glucose intolerance (impaired glucose tolerance)   Relevant Orders      CBC with Differential      Comprehensive metabolic panel      Hemoglobin A1c      Note: This dictation was prepared with Dragon dictation along with smaller phrase technology. Any transcriptional errors that result from this process are unintentional.

## 2013-09-06 NOTE — Assessment & Plan Note (Signed)
Will f/u MRA tomorrow and neurology he did have elevated CRP, trigeminal neuralgia vs a vascular problem/arteritis being considered I have given him another script for prednisone, this is the only thing that helps his pain, he is not to start until after we see MRA results, i will also run this by neurology

## 2013-09-06 NOTE — Patient Instructions (Signed)
Do not start the prednisone pak until I call Pain medication Get the labs done fasting  Take the advair on regular basis  F/U 3 months

## 2013-09-06 NOTE — Assessment & Plan Note (Signed)
Advised to use advair on regular basis, needs to quit smoking

## 2013-09-07 ENCOUNTER — Ambulatory Visit (INDEPENDENT_AMBULATORY_CARE_PROVIDER_SITE_OTHER): Payer: PRIVATE HEALTH INSURANCE

## 2013-09-07 DIAGNOSIS — H5789 Other specified disorders of eye and adnexa: Secondary | ICD-10-CM

## 2013-09-07 DIAGNOSIS — H02409 Unspecified ptosis of unspecified eyelid: Secondary | ICD-10-CM

## 2013-09-07 DIAGNOSIS — H02401 Unspecified ptosis of right eyelid: Secondary | ICD-10-CM

## 2013-09-07 DIAGNOSIS — H579 Unspecified disorder of eye and adnexa: Secondary | ICD-10-CM

## 2013-09-07 DIAGNOSIS — R51 Headache: Secondary | ICD-10-CM

## 2013-09-07 MED ORDER — GADOPENTETATE DIMEGLUMINE 469.01 MG/ML IV SOLN: 17.0000 mL | Freq: Once | INTRAVENOUS | Status: AC | PRN

## 2013-09-08 ENCOUNTER — Telehealth: Payer: Self-pay | Admitting: *Deleted

## 2013-09-08 LAB — HEMOGLOBIN A1C
Hgb A1c MFr Bld: 6.1 % — ABNORMAL HIGH (ref <5.7)
Mean Plasma Glucose: 128 mg/dL — ABNORMAL HIGH (ref <117)

## 2013-09-08 LAB — LIPID PANEL
Cholesterol: 187 mg/dL (ref 0–200)
HDL: 42 mg/dL (ref >39)
LDL Cholesterol: 114 mg/dL — ABNORMAL HIGH (ref 0–99)
Total CHOL/HDL Ratio: 4.5 Ratio
Triglycerides: 155 mg/dL — ABNORMAL HIGH (ref <150)
VLDL: 31 mg/dL (ref 0–40)

## 2013-09-08 LAB — COMPREHENSIVE METABOLIC PANEL
ALT: 13 U/L (ref 0–53)
AST: 12 U/L (ref 0–37)
Albumin: 3.8 g/dL (ref 3.5–5.2)
Alkaline Phosphatase: 69 U/L (ref 39–117)
BUN: 15 mg/dL (ref 6–23)
CO2: 23 mEq/L (ref 19–32)
Calcium: 9.2 mg/dL (ref 8.4–10.5)
Chloride: 111 mEq/L (ref 96–112)
Creat: 0.93 mg/dL (ref 0.50–1.35)
Glucose, Bld: 89 mg/dL (ref 70–99)
Potassium: 4.6 mEq/L (ref 3.5–5.3)
Sodium: 142 mEq/L (ref 135–145)
Total Bilirubin: 0.4 mg/dL (ref 0.2–1.2)
Total Protein: 6.3 g/dL (ref 6.0–8.3)

## 2013-09-08 LAB — CBC WITH DIFFERENTIAL/PLATELET
Basophils Absolute: 0 10*3/uL (ref 0.0–0.1)
Basophils Relative: 0 % (ref 0–1)
Eosinophils Absolute: 0.2 10*3/uL (ref 0.0–0.7)
Eosinophils Relative: 4 % (ref 0–5)
HCT: 40.8 % (ref 39.0–52.0)
Hemoglobin: 13.7 g/dL (ref 13.0–17.0)
Lymphocytes Relative: 20 % (ref 12–46)
Lymphs Abs: 1.1 10*3/uL (ref 0.7–4.0)
MCH: 31.2 pg (ref 26.0–34.0)
MCHC: 33.6 g/dL (ref 30.0–36.0)
MCV: 92.9 fL (ref 78.0–100.0)
Monocytes Absolute: 0.6 10*3/uL (ref 0.1–1.0)
Monocytes Relative: 10 % (ref 3–12)
Neutro Abs: 3.8 10*3/uL (ref 1.7–7.7)
Neutrophils Relative %: 66 % (ref 43–77)
Platelets: 224 10*3/uL (ref 150–400)
RBC: 4.39 MIL/uL (ref 4.22–5.81)
RDW: 14.2 % (ref 11.5–15.5)
WBC: 5.7 10*3/uL (ref 4.0–10.5)

## 2013-09-08 NOTE — Telephone Encounter (Signed)
Message copied by Sheral Flow on Fri Sep 08, 2013  9:37 AM ------      Message from: Vic Blackbird F      Created: Fri Sep 08, 2013  7:55 AM      Regarding: FW: Prednisone       Call pt, I still dont see any results of his MRA from yesterday, but I spoke with neurology, he can go ahead and start the prednisone while we wait on results of the testing      ----- Message -----         From: Penni Bombard, MD         Sent: 09/07/2013  11:50 AM           To: Alycia Rossetti, MD      Subject: RE: Prednisone                                           I agree with trying another round of prednisone.             -VRP            ----- Message -----         From: Alycia Rossetti, MD         Sent: 09/06/2013   8:38 PM           To: Penni Bombard, MD      Subject: Prednisone                                                I saw Mathan today, He is still in considerable pain, unable to tolerate NSAIDS, previous severe gastritis.       He has MRA tomorrow, do you have any objections to treating him with another round of prednisone this was the only thing that gave him relief, while we continue to work up his headache and ptosis.             - [place]             ------

## 2013-09-08 NOTE — Telephone Encounter (Signed)
Call placed to patient and patient wife made aware.

## 2013-09-09 LAB — PSA: PSA: <0.01 ng/mL (ref <=4.00)

## 2013-09-13 ENCOUNTER — Other Ambulatory Visit: Payer: Self-pay | Admitting: Family Medicine

## 2013-09-13 ENCOUNTER — Encounter: Payer: Self-pay | Admitting: Family Medicine

## 2013-09-13 DIAGNOSIS — E785 Hyperlipidemia, unspecified: Secondary | ICD-10-CM

## 2013-10-03 ENCOUNTER — Encounter: Payer: Self-pay | Admitting: Diagnostic Neuroimaging

## 2013-10-03 ENCOUNTER — Ambulatory Visit (INDEPENDENT_AMBULATORY_CARE_PROVIDER_SITE_OTHER): Payer: PRIVATE HEALTH INSURANCE | Admitting: Diagnostic Neuroimaging

## 2013-10-03 VITALS — BP 122/77 | HR 60 | Ht 67.5 in | Wt 171.8 lb

## 2013-10-03 DIAGNOSIS — R51 Headache: Secondary | ICD-10-CM

## 2013-10-03 NOTE — Progress Notes (Signed)
[facility]  PATIENT: Cory Foley DOB: 07-29-1950  REFERRING CLINICIAN: [place] HISTORY FROM: patient  REASON FOR VISIT:follow up   HISTORICAL  CHIEF COMPLAINT:  Chief Complaint  Patient presents with  . Follow-up    HISTORY OF PRESENT ILLNESS:   UPDATE 10/03/13: Since last visit, headaches slightly less frequent and less severe. MRI MRA results reviewed. Think topiramate is helping a little bit. Had severe headache on last Saturday (frontal, pressure, ringing in ear; no nausea, vomiting, photo or phonophobia).   PRIOR HPI (08/29/13): 63 year old right-handed male with high cholesterol migraine prostate cancer here for evaluation of headaches and dizziness. 5-6 weeks ago patient began severe frontal forehead headache with pressure behind the right eye. He went to see the eye doctor a was found to have blisters on his eyelid and given some drops. However headache and eye pain have persisted. No double vision, photophobia phonophobia nausea or vomiting. Patient was on topiramate for past 3 weeks without relief. He was treated with prednisone Dosepak which improved symptoms. After Dosepak was complete headaches return. Sometimes patient feels like he is walking on a slant. He has been having some ringing in the ears. Symptoms last hours or a whole day at a time.   REVIEW OF SYSTEMS: Full 14 system review of systems performed and notable only for memory loss dizziness headache [place] bladder freq urination joint pain back pain restless leg ringing in ears eye pain fatigue chills appetite change activity change.    ALLERGIES: Allergies  Allergen Reactions  . Codeine Other (See Comments)    Bad headache and sweats  . Morphine And Related Itching    sweats  . Percocet [Oxycodone-Acetaminophen] Other (See Comments)    Headache    HOME MEDICATIONS: Outpatient Prescriptions Prior to Visit  Medication Sig Dispense Refill  . albuterol (PROVENTIL HFA;VENTOLIN HFA)  108 (90 BASE) MCG/ACT inhaler Inhale 2 puffs into the lungs every 6 (six) hours as needed for wheezing.  1 Inhaler  0  . alendronate (FOSAMAX) 70 MG tablet Take 1 tablet (70 mg total) by mouth every 7 (seven) days. Take with a full glass of water on an empty stomach.  4 tablet  11  . aspirin 81 MG tablet Take 81 mg by mouth daily.        . Fluticasone-Salmeterol (ADVAIR) 250-50 MCG/DOSE AEPB Inhale 1 puff into the lungs 2 (two) times daily.      Marland Kitchen HYDROcodone-acetaminophen (NORCO) 10-325 MG per tablet Take 1 tablet by mouth every 4 (four) hours as needed.  120 tablet  0  . methylPREDNIsolone (MEDROL DOSPACK) 4 MG tablet follow package directions  21 tablet  0  . pantoprazole (PROTONIX) 40 MG tablet Take 1 tablet (40 mg total) by mouth daily.  30 tablet  5  . rOPINIRole (REQUIP) 0.25 MG tablet Take 3 tablets (0.75 mg total) by mouth at bedtime.  270 tablet  1  . simvastatin (ZOCOR) 20 MG tablet Take 20 mg by mouth daily.      Marland Kitchen topiramate (TOPAMAX) 50 MG tablet Take 1 tablet (50 mg total) by mouth 2 (two) times daily.  60 tablet  6   No facility-administered medications prior to visit.    PAST MEDICAL HISTORY: Past Medical History  Diagnosis Date  . SOB (shortness of breath) on exertion   . PVD (peripheral vascular disease)   . Prostate cancer 01/11/12    BX=Adenocarcinoma,Gleason=4+4=8, & 4+5=9,PSA=17.24,Volume= 15.45cc  . Allergy   . BPH with obstruction/lower urinary tract symptoms   .  Nocturia   . ED (erectile dysfunction)   . Anxiety   . Arthritis     knees  . COPD, mild   . Osteoporosis   . GERD (gastroesophageal reflux disease)     PAST SURGICAL HISTORY: Past Surgical History  Procedure Laterality Date  . Hernia repair      Bilateral inguinal X2  . Joint replacement      bilateral  . Nose surgery    . Knee surgery      Left Knee X 2   and Right knee X1  . Pr vein bypass graft,aorto-fem-pop  10/21/10    Left AK to BK popliteal BPG  . Prostate biopsy  01/11/2012     Adenocarcinoma  . Gold seed implatation  04/28/2012  . Colonoscopy N/A 09/28/2012    Procedure: COLONOSCOPY;  Surgeon: Danie Binder, MD;  [facility]: [facility];  Service: Endoscopy;  Laterality: N/A;  8:30  . Esophagogastroduodenoscopy N/A 09/28/2012    Procedure: ESOPHAGOGASTRODUODENOSCOPY (EGD);  Surgeon: Danie Binder, MD;  Location: [facility];  Service: Endoscopy;  Laterality: N/A;  . Savory dilation N/A 09/28/2012    Procedure: SAVORY DILATION;  Surgeon: Danie Binder, MD;  Location: [facility];  Service: Endoscopy;  Laterality: N/A;  Venia Minks dilation N/A 09/28/2012    Procedure: Venia Minks DILATION;  Surgeon: Danie Binder, MD;  Location: [facility];  Service: Endoscopy;  Laterality: N/A;  . Prostate surgery  2015    Chemo and  Radiation    FAMILY HISTORY: Family History  Problem Relation Age of Onset  . Heart disease Mother     Valve regurgitation and Pacemaker   . Diabetes Mother   . Hyperlipidemia Mother   . Heart disease Father     CABG x 5  . Hyperlipidemia Father   . Hypertension Father   . Heart attack Father   . Heart disease Sister     aortic valve replacement  . Cancer Sister 58    Colon cancer w/ metastasis  . Hypertension Brother     SOCIAL HISTORY:  History   Social History  . Marital Status: Married    Spouse Name: Glenda    Number of Children: 2  . Years of Education: College   Occupational History  .      [employer]"  .  Other    disability   Social History Main Topics  . Smoking status: Current Every Day Smoker -- 0.50 packs/day for 43 years    Types: Cigarettes  . Smokeless tobacco: Never Used     Comment: 1/2 ppd  . Alcohol Use: No     Comment: quit 1.5 years ago  . Drug Use: No  . Sexual Activity: Not Currently   Other Topics Concern  . Not on file   Social History Narrative   Patient lives at home with spouse.   Caffeine use: very little     PHYSICAL EXAM  Filed Vitals:   10/03/13 1104  BP: 122/77    Pulse: 60  Height: 5' 7.5" (1.715 m)  Weight: 171 lb 12.8 oz (77.928 kg)    Not recorded    Body mass index is 26.5 kg/(m^2).  GENERAL EXAM: Patient is in no distress; well developed, nourished and groomed; neck is supple  CARDIOVASCULAR: Regular rate and rhythm, no murmurs, no carotid bruits  NEUROLOGIC: MENTAL STATUS: awake, alert, language fluent, comprehension intact, naming intact, fund of knowledge appropriate CRANIAL NERVE: no papilledema on fundoscopic exam,  pupils equal and reactive to light, visual fields full to confrontation, extraocular muscles intact, no nystagmus, facial sensation symmetric, RIGHT PTOSIS, RIGHT FACE SYNKINESIS ON EYE CLOSURE, SLIGHTLY DECR RIGHT NL FOLD ON SMILE (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 Hearing intact, palate elevates symmetrically, uvula midline, shoulder shrug symmetric, tongue midline. MOTOR: normal bulk and tone, full strength in the BUE, BLE SENSORY: normal and symmetric to light touch, pinprick, temperature, vibration; DECR IN LEFT LEG COORDINATION: finger-nose-finger, fine finger movements normal REFLEXES: BUE 1, KNEES 1, ANKLES 0 GAIT/STATION: narrow based gait; able to walk on toes, heels; romberg is negative    DIAGNOSTIC DATA (LABS, IMAGING, TESTING) - I reviewed patient records, labs, notes, testing and imaging myself where available.  Lab Results  Component Value Date   WBC 5.7 09/08/2013   HGB 13.7 09/08/2013   HCT 40.8 09/08/2013   MCV 92.9 09/08/2013   PLT 224 09/08/2013      Component Value Date/Time   NA 142 09/08/2013 0712   K 4.6 09/08/2013 0712   CL 111 09/08/2013 0712   CO2 23 09/08/2013 0712   GLUCOSE 89 09/08/2013 0712   BUN 15 09/08/2013 0712   CREATININE 0.93 09/08/2013 0712   CREATININE 0.82 02/22/2012 1159   CALCIUM 9.2 09/08/2013 0712   PROT 6.3 09/08/2013 0712   ALBUMIN 3.8 09/08/2013 0712   AST 12 09/08/2013 0712   ALT 13 09/08/2013 0712   ALKPHOS 69 09/08/2013 0712   BILITOT 0.4 09/08/2013 0712   GFRNONAA >90  02/22/2012 1159   GFRAA >90 02/22/2012 1159   Lab Results  Component Value Date   CHOL 187 09/08/2013   HDL 42 09/08/2013   LDLCALC 114* 09/08/2013   TRIG 155* 09/08/2013   CHOLHDL 4.5 09/08/2013   Lab Results  Component Value Date   HGBA1C 6.1* 09/08/2013   No results found for this basename: VITAMINB12   Lab Results  Component Value Date   TSH 0.973 08/24/2012    I reviewed images myself and agree with interpretation. -VRP  08/10/13 MRI brain (without)  1. No evidence of acute intracranial abnormality.  2. Multiple foci of white matter T2 signal abnormality, greater than expected for age and nonspecific. This is most commonly seen in the setting of chronic small vessel ischemia, with other considerations including sequelae of migraines, vasculitis, prior infection, or less likely demyelination.  09/07/13 MRI brain (with) - no abnl enhancing lesions  09/07/13 MRA head - normal   ASSESSMENT AND PLAN  63 y.o. year old male here with new onset headache and right eye pain and scleral injection in May/June, with "blisters" on right eyelid (?blepharitis). Now with persistent tension type headache. Slightly better in last few weeks. Neuroimaging unremarkable.   Ddx: tension headache vs trigeminal autonomic headache vs eye ifx/inflamm related headache  PLAN: - taper topiramate off over next 2 months  Return in about 3 months (around 01/03/2014).    Penni Bombard, MD 04/06/2480, 50:03 AM Certified in Neurology, Neurophysiology and Neuroimaging  [facility] [address] [place], [place] [address] [phone]

## 2013-10-03 NOTE — Patient Instructions (Signed)
May try tapering topiramate off over next 2 months.

## 2013-10-31 ENCOUNTER — Telehealth: Payer: Self-pay | Admitting: Family Medicine

## 2013-10-31 MED ORDER — HYDROCODONE-ACETAMINOPHEN 10-325 MG PO TABS: 1.0000 | ORAL_TABLET | ORAL | Status: DC | PRN

## 2013-10-31 NOTE — Telephone Encounter (Signed)
Prescription printed and patient made aware to come to office to pick up.  

## 2013-10-31 NOTE — Telephone Encounter (Signed)
okay

## 2013-10-31 NOTE — Telephone Encounter (Signed)
Ok to refill??  Last office visit/ refill 09/06/2013.

## 2013-10-31 NOTE — Telephone Encounter (Signed)
[phone]  Pt is needing a refill on his HYDROcodone-acetaminophen (NORCO) 10-325 MG per tablet

## 2013-12-05 ENCOUNTER — Telehealth: Payer: Self-pay | Admitting: Diagnostic Neuroimaging

## 2013-12-05 ENCOUNTER — Encounter: Payer: Self-pay | Admitting: Diagnostic Neuroimaging

## 2013-12-05 NOTE — Telephone Encounter (Signed)
Left message for patient regarding rescheduling 01/04/14 appointment per Dr Gladstone Lighter schedule, printed and sent letter with new appointment time.

## 2013-12-08 ENCOUNTER — Ambulatory Visit (INDEPENDENT_AMBULATORY_CARE_PROVIDER_SITE_OTHER): Payer: PRIVATE HEALTH INSURANCE | Admitting: Family Medicine

## 2013-12-08 ENCOUNTER — Encounter: Payer: Self-pay | Admitting: Family Medicine

## 2013-12-08 VITALS — BP 116/68 | HR 64 | Temp 99.2°F | Resp 16 | Ht 67.0 in | Wt 172.0 lb

## 2013-12-08 DIAGNOSIS — M5442 Lumbago with sciatica, left side: Secondary | ICD-10-CM

## 2013-12-08 DIAGNOSIS — G2581 Restless legs syndrome: Secondary | ICD-10-CM

## 2013-12-08 DIAGNOSIS — M5136 Other intervertebral disc degeneration, lumbar region: Secondary | ICD-10-CM

## 2013-12-08 DIAGNOSIS — R5382 Chronic fatigue, unspecified: Secondary | ICD-10-CM

## 2013-12-08 DIAGNOSIS — E785 Hyperlipidemia, unspecified: Secondary | ICD-10-CM

## 2013-12-08 DIAGNOSIS — C61 Malignant neoplasm of prostate: Secondary | ICD-10-CM

## 2013-12-08 MED ORDER — HYDROCODONE-ACETAMINOPHEN 10-325 MG PO TABS: 1.0000 | ORAL_TABLET | ORAL | Status: DC | PRN

## 2013-12-08 NOTE — Patient Instructions (Signed)
MRI of lumbar spine Try the rapaflo for the next week Simvastatin increased to 20mg  once a day F/U 4 months

## 2013-12-10 DIAGNOSIS — M5136 Other intervertebral disc degeneration, lumbar region: Secondary | ICD-10-CM | POA: Insufficient documentation

## 2013-12-10 NOTE — Assessment & Plan Note (Signed)
Continue requip

## 2013-12-10 NOTE — Assessment & Plan Note (Signed)
Increase zocor to 20mg  Discussed dietary changes

## 2013-12-10 NOTE — Assessment & Plan Note (Signed)
Worsening back pain in setting of recent cancer, xrays of coccyx region showed no lytic lesions back in April, DDD noted on l spine xrays Obtain MRI, continue pain meds, no NSAIDS due to GI history

## 2013-12-10 NOTE — Assessment & Plan Note (Signed)
Trial of rapaflo for urinary symptoms worsened since treatment for prostate cancer

## 2013-12-10 NOTE — Progress Notes (Signed)
Patient ID: Cory Foley, male   DOB: Nov 18, 1950, 63 y.o.   MRN: [id number]   Subjective:    Patient ID: Cory Foley, male    DOB: 10/20/1950, 63 y.o.   MRN: [id number]  Patient presents for 3 month F/U and Pain History of prostate cancer, continue to have worsening lumbar back pain and sacral pain. PSA have been undetectable for past 6 months. Taking pain medication on regular basis. Feels like his legs are going to give out on him, pain worse after sitting or standing long periods of time. No parethesia in saddle area.   Labs done for wellness at work- Cholesterol increased LDL up to 142 he was to increase zocor to 20mg  however he only receieved 10mg  at pharmacy and did not increase dose  Urinary frequency, continues to have frequency, and dribbling at times, has tried a few medications by urology with no improvement. Feels like he does not empty bladder all the way  Review Of Systems:  GEN- denies fatigue, fever, weight loss,weakness, recent illness HEENT- denies eye drainage, change in vision, nasal discharge, CVS- denies chest pain, palpitations RESP- denies SOB, cough, wheeze ABD- denies N/V, change in stools, abd pain GU- denies dysuria, hematuria,+ dribbling, incontinence MSK- + joint pain, +muscle aches, injury Neuro- denies headache, dizziness, syncope, seizure activity       Objective:    BP 116/68  Pulse 64  Temp(Src) 99.2 F (37.3 C) (Oral)  Resp 16  Ht 5\' 7"  (1.702 m)  Wt 172 lb (78.019 kg)  BMI 26.93 kg/m2 GEN- NAD, alert and oriented x3 HEENT- PERRL, EOMI, non injected sclera, pink conjunctiva, MMM, oropharynx clear CVS- RRR, no murmur RESP-CTAB ABD-NABS,soft,NT,ND MSK- TTP lumbar spine, +SLR left side, fair ROM spine, normal ROM hips Neuro- sensastion in tact, DTR symmetric, motor equal bilat, normal tone LE EXT- No edema Pulses- Radial 2+        Assessment & Plan:      Problem List Items Addressed This Visit   None      Note: This dictation  was prepared with Dragon dictation along with smaller phrase technology. Any transcriptional errors that result from this process are unintentional.

## 2013-12-14 ENCOUNTER — Ambulatory Visit (HOSPITAL_COMMUNITY)
Admission: RE | Admit: 2013-12-14 | Discharge: 2013-12-14 | Disposition: A | Discharge status: Home / Self Care | Payer: PRIVATE HEALTH INSURANCE | Source: Ambulatory Visit | Attending: Family Medicine | Admitting: Family Medicine

## 2013-12-14 DIAGNOSIS — M79669 Pain in unspecified lower leg: Secondary | ICD-10-CM | POA: Diagnosis not present

## 2013-12-14 DIAGNOSIS — M545 Low back pain: Secondary | ICD-10-CM | POA: Diagnosis present

## 2013-12-14 DIAGNOSIS — M479 Spondylosis, unspecified: Secondary | ICD-10-CM | POA: Insufficient documentation

## 2013-12-14 DIAGNOSIS — M5126 Other intervertebral disc displacement, lumbar region: Secondary | ICD-10-CM | POA: Insufficient documentation

## 2013-12-14 DIAGNOSIS — M5442 Lumbago with sciatica, left side: Secondary | ICD-10-CM

## 2013-12-14 DIAGNOSIS — Z8546 Personal history of malignant neoplasm of prostate: Secondary | ICD-10-CM | POA: Insufficient documentation

## 2013-12-14 DIAGNOSIS — M5136 Other intervertebral disc degeneration, lumbar region: Secondary | ICD-10-CM

## 2013-12-18 ENCOUNTER — Other Ambulatory Visit: Payer: Self-pay | Admitting: *Deleted

## 2013-12-18 ENCOUNTER — Other Ambulatory Visit: Payer: Self-pay | Admitting: Family Medicine

## 2013-12-18 DIAGNOSIS — M5136 Other intervertebral disc degeneration, lumbar region: Secondary | ICD-10-CM

## 2013-12-18 DIAGNOSIS — M5126 Other intervertebral disc displacement, lumbar region: Secondary | ICD-10-CM

## 2013-12-18 DIAGNOSIS — M5416 Radiculopathy, lumbar region: Secondary | ICD-10-CM

## 2013-12-18 MED ORDER — SIMVASTATIN 20 MG PO TABS: 20.0000 mg | ORAL_TABLET | Freq: Every day | ORAL | Status: DC

## 2013-12-18 NOTE — Telephone Encounter (Signed)
Refill appropriate and filled per protocol. 

## 2014-01-02 ENCOUNTER — Encounter: Payer: Self-pay | Admitting: Gastroenterology

## 2014-01-04 ENCOUNTER — Ambulatory Visit: Payer: PRIVATE HEALTH INSURANCE | Admitting: Diagnostic Neuroimaging

## 2014-01-08 ENCOUNTER — Ambulatory Visit: Payer: PRIVATE HEALTH INSURANCE | Admitting: Diagnostic Neuroimaging

## 2014-01-29 ENCOUNTER — Telehealth: Payer: Self-pay | Admitting: Family Medicine

## 2014-01-29 MED ORDER — HYDROCODONE-ACETAMINOPHEN 10-325 MG PO TABS: 1.0000 | ORAL_TABLET | ORAL | Status: DC | PRN

## 2014-01-29 NOTE — Telephone Encounter (Signed)
okay

## 2014-01-29 NOTE — Telephone Encounter (Signed)
Prescription printed and patient made aware to come to office to pick up per VM.  

## 2014-01-29 NOTE — Telephone Encounter (Signed)
Ok to refill??  Last office visit/ refill 12/08/2013.

## 2014-01-29 NOTE — Telephone Encounter (Signed)
[phone]  Pt is needing a refill on HYDROcodone-acetaminophen (NORCO) 10-325 MG per tablet

## 2014-03-09 ENCOUNTER — Encounter: Payer: Self-pay | Admitting: Family Medicine

## 2014-03-09 ENCOUNTER — Ambulatory Visit (INDEPENDENT_AMBULATORY_CARE_PROVIDER_SITE_OTHER): Payer: PRIVATE HEALTH INSURANCE | Admitting: Family Medicine

## 2014-03-09 VITALS — BP 136/68 | HR 72 | Temp 98.4°F | Resp 16 | Ht 67.0 in | Wt 173.0 lb

## 2014-03-09 DIAGNOSIS — R1310 Dysphagia, unspecified: Secondary | ICD-10-CM

## 2014-03-09 DIAGNOSIS — E785 Hyperlipidemia, unspecified: Secondary | ICD-10-CM

## 2014-03-09 DIAGNOSIS — R5382 Chronic fatigue, unspecified: Secondary | ICD-10-CM | POA: Diagnosis not present

## 2014-03-09 DIAGNOSIS — G2581 Restless legs syndrome: Secondary | ICD-10-CM | POA: Diagnosis not present

## 2014-03-09 DIAGNOSIS — C61 Malignant neoplasm of prostate: Secondary | ICD-10-CM

## 2014-03-09 MED ORDER — HYDROCODONE-ACETAMINOPHEN 10-325 MG PO TABS: 1.0000 | ORAL_TABLET | ORAL | Status: DC | PRN

## 2014-03-09 MED ORDER — PANTOPRAZOLE SODIUM 40 MG PO TBEC: 40.0000 mg | DELAYED_RELEASE_TABLET | Freq: Every day | ORAL | Status: DC

## 2014-03-09 NOTE — Assessment & Plan Note (Signed)
Continue requip doing well on meds

## 2014-03-09 NOTE — Patient Instructions (Signed)
Get labs done fasting , we will call with results  Pain medication refilled F/U 4 months

## 2014-03-09 NOTE — Assessment & Plan Note (Signed)
Doing well on protonix. 

## 2014-03-09 NOTE — Assessment & Plan Note (Signed)
Check FLP continue statin drug

## 2014-03-09 NOTE — Progress Notes (Signed)
Patient ID: Cory Foley, male   DOB: 19-Sep-1950, 64 y.o.   MRN: [id number]   Subjective:    Patient ID: Cory Foley, male    DOB: 03-21-1950, 63 y.o.   MRN: [id number]  Patient presents for 3 month F/U  patient here to follow chronic medical problems. He continues to have chronic fatigue and chronic pain he states his pain medicine as prescribed. Regarding his acid reflux this is well-controlled with the protonix   He is also due for repeat lipid panel he is currently on simvastatin    Review Of Systems:  GEN- + fatigue, fever, weight loss,weakness, recent illness HEENT- denies eye drainage, change in vision, nasal discharge, CVS- denies chest pain, palpitations RESP- denies SOB, cough, wheeze ABD- denies N/V, change in stools, abd pain GU- denies dysuria, hematuria, dribbling, incontinence MSK-+ joint pain, muscle aches, injury Neuro- denies headache, dizziness, syncope, seizure activity       Objective:    BP 136/68 mmHg  Pulse 72  Temp(Src) 98.4 F (36.9 C) (Oral)  Resp 16  Ht 5\' 7"  (1.702 m)  Wt 173 lb (78.472 kg)  BMI 27.09 kg/m2 GEN- NAD, alert and oriented x3 HEENT- PERRL, EOMI, non injected sclera, pink conjunctiva, MMM, oropharynx clear CVS- RRR, no murmur RESP-CTAB EXT- No edema Pulses- Radial 2+        Assessment & Plan:      Problem List Items Addressed This Visit      Unprioritized   Hyperlipidemia - Primary   Relevant Orders   CBC with Differential   Comprehensive metabolic panel   Lipid panel      Note: This dictation was prepared with Dragon dictation along with smaller phrase technology. Any transcriptional errors that result from this process are unintentional.

## 2014-03-12 LAB — CBC WITH DIFFERENTIAL/PLATELET
Basophils Absolute: 0 10*3/uL (ref 0.0–0.1)
Basophils Relative: 0 % (ref 0–1)
Eosinophils Absolute: 0.1 10*3/uL (ref 0.0–0.7)
Eosinophils Relative: 2 % (ref 0–5)
HCT: 43.1 % (ref 39.0–52.0)
Hemoglobin: 14.2 g/dL (ref 13.0–17.0)
Lymphocytes Relative: 16 % (ref 12–46)
Lymphs Abs: 1.1 10*3/uL (ref 0.7–4.0)
MCH: 31.3 pg (ref 26.0–34.0)
MCHC: 32.9 g/dL (ref 30.0–36.0)
MCV: 95.1 fL (ref 78.0–100.0)
MPV: 10.4 fL (ref 8.6–12.4)
Monocytes Absolute: 0.6 10*3/uL (ref 0.1–1.0)
Monocytes Relative: 9 % (ref 3–12)
Neutro Abs: 4.9 10*3/uL (ref 1.7–7.7)
Neutrophils Relative %: 73 % (ref 43–77)
Platelets: 268 10*3/uL (ref 150–400)
RBC: 4.53 MIL/uL (ref 4.22–5.81)
RDW: 14.3 % (ref 11.5–15.5)
WBC: 6.7 10*3/uL (ref 4.0–10.5)

## 2014-03-12 LAB — COMPREHENSIVE METABOLIC PANEL
ALT: 13 U/L (ref 0–53)
AST: 13 U/L (ref 0–37)
Albumin: 4.1 g/dL (ref 3.5–5.2)
Alkaline Phosphatase: 66 U/L (ref 39–117)
BUN: 10 mg/dL (ref 6–23)
CO2: 26 mEq/L (ref 19–32)
Calcium: 9.5 mg/dL (ref 8.4–10.5)
Chloride: 105 mEq/L (ref 96–112)
Creat: 0.78 mg/dL (ref 0.50–1.35)
Glucose, Bld: 90 mg/dL (ref 70–99)
Potassium: 4.8 mEq/L (ref 3.5–5.3)
Sodium: 140 mEq/L (ref 135–145)
Total Bilirubin: 0.4 mg/dL (ref 0.2–1.2)
Total Protein: 6.8 g/dL (ref 6.0–8.3)

## 2014-03-12 LAB — LIPID PANEL
Cholesterol: 188 mg/dL (ref 0–200)
HDL: 46 mg/dL (ref >39)
LDL Cholesterol: 111 mg/dL — ABNORMAL HIGH (ref 0–99)
Total CHOL/HDL Ratio: 4.1 Ratio
Triglycerides: 153 mg/dL — ABNORMAL HIGH (ref <150)
VLDL: 31 mg/dL (ref 0–40)

## 2014-03-15 ENCOUNTER — Encounter: Payer: Self-pay | Admitting: *Deleted

## 2014-03-28 ENCOUNTER — Encounter (HOSPITAL_BASED_OUTPATIENT_CLINIC_OR_DEPARTMENT_OTHER): Payer: PRIVATE HEALTH INSURANCE | Attending: Surgery

## 2014-03-28 DIAGNOSIS — Y842 Radiological procedure and radiotherapy as the cause of abnormal reaction of the patient, or of later complication, without mention of misadventure at the time of the procedure: Secondary | ICD-10-CM | POA: Insufficient documentation

## 2014-03-28 DIAGNOSIS — B9689 Other specified bacterial agents as the cause of diseases classified elsewhere: Secondary | ICD-10-CM | POA: Diagnosis not present

## 2014-03-28 DIAGNOSIS — C61 Malignant neoplasm of prostate: Secondary | ICD-10-CM | POA: Insufficient documentation

## 2014-03-28 DIAGNOSIS — N3040 Irradiation cystitis without hematuria: Secondary | ICD-10-CM | POA: Insufficient documentation

## 2014-03-29 NOTE — H&P (Signed)
Cory Foley, CARLL NO.:  [id number]  MEDICAL RECORD NO.:  [id number]  LOCATION:  FOOT                         FACILITY:  [facility]  PHYSICIAN:  Christin Fudge, MD       DATE OF BIRTH:  10-21-1950  DATE OF ADMISSION:  03/28/2014 DATE OF DISCHARGE:                             HISTORY & PHYSICAL   WOUND CARE CONSULT  This pleasant 64 year old gentleman comes from the Urology department with the chief complaints of having frequency, urgency, and incontinence of urine.  He has been having this for several months.  HPI:  The patient is a patient of the [facility] and has been seen by Dr. Lowella Bandy.  This gentleman was treated with radiation therapy from May 25, 2012, to Jul 20, 2012, for adenocarcinoma of the prostate.  He also received various other modalities of treatment.  He has continued to have problems with frequency and urgency of urination, has to go to the bathroom every 1 to 1-1/2 hour.  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  PAST MEDICAL HISTORY:  COPD, prostate cancer, and arthritis.  PAST SURGICAL HISTORY:  He has had a CABG done in August 2012.  He has had history of inguinal hernia repair x2.  He has had knee surgery on both right and left knees and he has had extraction of his teeth.  ALLERGIES:  CODEINE, MORPHINE, AND PERCOCET.  MEDICATIONS:  I have reviewed a list of his medications and he is on: 1. Albuterol. 2. Alendronate sodium. 3. He is on aspirin. 4. He is on hydrocodone and Protonix.  I have reviewed a list of all his other medications.  FAMILY HISTORY:  Colon cancer and diabetes mellitus.  SOCIAL HISTORY:  He does not smoke or drink alcohol.  REVIEW OF SYSTEMS:  Reveals he has got frequent urination with urgency and incontinence.  Other than that, I have done a systemic review of  all other systems and everything else is negative.  PHYSICAL EXAMINATION:  GENERAL:  On clinical examination, this is a pleasant gentleman lying comfortably in bed. VITAL SIGNS:  Stable.  5 feet 7 inches height, 173 pounds.  Afebrile. Pulse of 62, respirations 20, blood pressure of 114/72, and glucose was not done. HEENT:  Oral cavity is normal. NECK:  Supple.  Sclerae are normal. CVS:  Normal heart sounds. RS:  Air entry equal both bases. ABDOMEN:  Soft, nontender.  No masses palpable. RECTAL:  Deferred. EXTREMITIES:  Within normal limits. VASCULAR SYSTEM:  Palpable peripheral pulses.  CLINICAL IMPRESSION: 1. Adenocarcinoma of the prostate. 2. Radiation cystitis. 3. Status post hernia repair. 4. Status post bilateral knee surgery.  INVESTIGATIONS:  The most recent investigations done have been reviewed by me, and he does not have a recent chest x-ray or EKG or echo and we shall get this done.  I have reviewed him for hyperbaric oxygen therapy, and this will be given to him for the late effects of radiation cystitis.  PLAN:  Would be to get a chest x-ray and echocardiogram, and if all  these are normal, I would recommend that he get hyperbaric oxygen treatment with 2.5 ATA for 90 minutes with 5 minute air break and treated once daily for a total of 40 treatments.  I would recommend this and once he is approved, we will start his treatment after appropriate physical examination.  The patient is agreeable __________ has been given and all questions were answered.          ______________________________ Christin Fudge, MD     EB/MEDQ  D:  03/28/2014  T:  03/29/2014  Job:  [id number]  cc:   Surgical Office

## 2014-05-14 ENCOUNTER — Telehealth: Payer: Self-pay | Admitting: Family Medicine

## 2014-05-14 MED ORDER — HYDROCODONE-ACETAMINOPHEN 10-325 MG PO TABS: 1.0000 | ORAL_TABLET | ORAL | Status: DC | PRN

## 2014-05-14 NOTE — Telephone Encounter (Signed)
Patient calling to get refill on hydrocodone  [phone]

## 2014-05-14 NOTE — Telephone Encounter (Signed)
Ok to refill??  Last office visit/ refill 03/09/2014.

## 2014-05-14 NOTE — Telephone Encounter (Signed)
Prescription printed and patient made aware to come to office to pick up.  

## 2014-05-14 NOTE — Telephone Encounter (Signed)
Okay to refill? 

## 2014-06-06 ENCOUNTER — Other Ambulatory Visit: Payer: Self-pay | Admitting: Family Medicine

## 2014-06-06 NOTE — Telephone Encounter (Signed)
Refill appropriate and filled per protocol. 

## 2014-06-07 ENCOUNTER — Telehealth: Payer: Self-pay | Admitting: *Deleted

## 2014-06-07 NOTE — Telephone Encounter (Signed)
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 Buelah Manis, pt is aware of 20 dollar fee

## 2014-06-14 NOTE — Telephone Encounter (Signed)
Received forms back at my desk filled out and pt aware to pay fee, pt made a payment by phone and FMLA was faxed to [employer].

## 2014-07-02 ENCOUNTER — Encounter (HOSPITAL_COMMUNITY): Payer: PRIVATE HEALTH INSURANCE

## 2014-07-02 ENCOUNTER — Other Ambulatory Visit (HOSPITAL_COMMUNITY): Payer: PRIVATE HEALTH INSURANCE

## 2014-07-02 ENCOUNTER — Ambulatory Visit: Payer: PRIVATE HEALTH INSURANCE | Admitting: Family

## 2014-07-09 ENCOUNTER — Encounter: Payer: Self-pay | Admitting: Family Medicine

## 2014-07-09 ENCOUNTER — Ambulatory Visit (INDEPENDENT_AMBULATORY_CARE_PROVIDER_SITE_OTHER): Payer: PRIVATE HEALTH INSURANCE | Admitting: Family Medicine

## 2014-07-09 VITALS — BP 110/78 | HR 80 | Temp 99.1°F | Resp 18 | Ht 67.0 in | Wt 172.0 lb

## 2014-07-09 DIAGNOSIS — F17200 Nicotine dependence, unspecified, uncomplicated: Secondary | ICD-10-CM

## 2014-07-09 DIAGNOSIS — M5136 Other intervertebral disc degeneration, lumbar region: Secondary | ICD-10-CM

## 2014-07-09 DIAGNOSIS — E785 Hyperlipidemia, unspecified: Secondary | ICD-10-CM | POA: Diagnosis not present

## 2014-07-09 DIAGNOSIS — Z720 Tobacco use: Secondary | ICD-10-CM

## 2014-07-09 DIAGNOSIS — G4700 Insomnia, unspecified: Secondary | ICD-10-CM

## 2014-07-09 DIAGNOSIS — I739 Peripheral vascular disease, unspecified: Secondary | ICD-10-CM

## 2014-07-09 DIAGNOSIS — G44229 Chronic tension-type headache, not intractable: Secondary | ICD-10-CM

## 2014-07-09 MED ORDER — HYDROCODONE-ACETAMINOPHEN 10-325 MG PO TABS: 1.0000 | ORAL_TABLET | ORAL | Status: DC | PRN

## 2014-07-09 NOTE — Assessment & Plan Note (Signed)
Counseled on tobacco cessation, is trying to cut back

## 2014-07-09 NOTE — Assessment & Plan Note (Signed)
Chronic insomnia. He will try melatonin which she has not done yet.

## 2014-07-09 NOTE — Progress Notes (Signed)
Patient ID: Cory Foley, male   DOB: [birth year], 64 y.o.   MRN: [id number]   Subjective:    Patient ID: Cory Foley, male    DOB: 16-Aug-1950, 64 y.o.   MRN: [id number]  Patient presents for folllow up  Patient here to follow-up chronic medical pounds. He continues have chronic back pain as well as sacral pain and lower pelvic pain. He had labs done today for his urology has follow-up next Monday with them. He continues to have hot flashes and other symptoms from when he had his hormone therapy. He also continues to have headaches which are preceded by reading in the ears he has seen neurology for this does not tolerate the migraine medications. The symptoms are worse when he gets poor sleep. He is still taking his restless leg medicine and his pain medicine which is the only thing that helps.   Review Of Systems:  GEN-+fatigue, fever, weight loss,weakness, recent illness HEENT- denies eye drainage, change in vision, nasal discharge, CVS- denies chest pain, palpitations RESP- denies SOB, cough, wheeze ABD- denies N/V, change in stools, abd pain GU- denies dysuria, hematuria, dribbling, incontinence MSK- + joint pain, muscle aches, injury Neuro- denies headache, dizziness, syncope, seizure activity       Objective:    BP 110/78 mmHg  Pulse 80  Temp(Src) 99.1 F (37.3 C) (Oral)  Resp 18 GEN- NAD, alert and oriented x3.fatigued appearing HEENT- PERRL, EOMI, non injected sclera, pink conjunctiva, MMM, oropharynx clear CVS- RRR, no murmur RESP-CTAB ABD-NABS,soft,mild suprapubic tenderness, no rebound, no guardingND EXT- No edema Pulses- Radial, 2+        Assessment & Plan:      Problem List Items Addressed This Visit    None      Note: This dictation was prepared with Dragon dictation along with smaller phrase technology. Any transcriptional errors that result from this process are unintentional.

## 2014-07-09 NOTE — Assessment & Plan Note (Signed)
Headaches are chronic. They are associated with poor sleep and stress as well. He does not tolerate some of the headache medications. He is continuing on hydrocodone

## 2014-07-09 NOTE — Patient Instructions (Addendum)
Continue current medications  Try the Melatonin for sleep Pain scripts given  F/U 6 months

## 2014-07-09 NOTE — Assessment & Plan Note (Signed)
Plan for repeat fasting labs at next visit. His LDL is significantly improved

## 2014-07-09 NOTE — Assessment & Plan Note (Signed)
Currently on statin drug for cholesterol control. He is also on requip for concurrent restless leg syndrome.

## 2014-07-10 ENCOUNTER — Other Ambulatory Visit: Payer: Self-pay | Admitting: Family Medicine

## 2014-07-10 NOTE — Telephone Encounter (Signed)
Medication refilled per protocol. 

## 2014-07-16 ENCOUNTER — Telehealth: Payer: Self-pay | Admitting: Family Medicine

## 2014-07-16 NOTE — Telephone Encounter (Signed)
Returned call to patient.   Reports that he was seen by specialist today and was given Paroxetine 10mg  for hot flashes.   Patient had concerns about SE with medications.   Advised to try medications x1 week and if SE noted to contact office.   MD made aware.

## 2014-07-16 NOTE — Telephone Encounter (Signed)
Patient is calling to speak to you regarding a medication that he was prescribed by another dr, it is called paroxetine Please call him at  [phone]

## 2014-08-20 ENCOUNTER — Encounter (HOSPITAL_COMMUNITY): Payer: PRIVATE HEALTH INSURANCE

## 2014-08-20 ENCOUNTER — Ambulatory Visit: Payer: PRIVATE HEALTH INSURANCE | Admitting: Family

## 2014-08-28 ENCOUNTER — Other Ambulatory Visit: Payer: Self-pay | Admitting: Family Medicine

## 2014-08-28 MED ORDER — HYDROCODONE-ACETAMINOPHEN 10-325 MG PO TABS: 1.0000 | ORAL_TABLET | ORAL | Status: DC | PRN

## 2014-08-28 NOTE — Telephone Encounter (Signed)
[phone] PT is needing a refill on HYDROcodone-acetaminophen (NORCO) 10-325 MG per tablet

## 2014-08-28 NOTE — Telephone Encounter (Signed)
Ok to refill??  Last office visit/ refill 07/09/2014.

## 2014-08-28 NOTE — Telephone Encounter (Signed)
okay

## 2014-08-28 NOTE — Telephone Encounter (Signed)
Patient in office to bring sister for appointment.   Spoke with MD about refill and MD approved.   Prescription printed.

## 2014-10-15 ENCOUNTER — Telehealth: Payer: Self-pay | Admitting: Family Medicine

## 2014-10-15 MED ORDER — HYDROCODONE-ACETAMINOPHEN 10-325 MG PO TABS: 1.0000 | ORAL_TABLET | ORAL | Status: DC | PRN

## 2014-10-15 NOTE — Telephone Encounter (Signed)
Okay to refill? 

## 2014-10-15 NOTE — Telephone Encounter (Signed)
Patient called in requesting a prescription for his HYDROcodone-acetaminophen ([place]) 10-325 MG

## 2014-10-15 NOTE — Telephone Encounter (Signed)
Prescription printed and patient made aware to come to office to pick up after 2pm on 10/15/2014.

## 2014-10-15 NOTE — Telephone Encounter (Signed)
Ok to refill??  Last office visit 07/09/2014.  Last refill 08/28/2014.

## 2014-10-31 ENCOUNTER — Ambulatory Visit (INDEPENDENT_AMBULATORY_CARE_PROVIDER_SITE_OTHER): Payer: PRIVATE HEALTH INSURANCE | Admitting: Family Medicine

## 2014-10-31 ENCOUNTER — Ambulatory Visit (HOSPITAL_COMMUNITY)
Admission: RE | Admit: 2014-10-31 | Discharge: 2014-10-31 | Disposition: A | Discharge status: Home / Self Care | Payer: PRIVATE HEALTH INSURANCE | Source: Ambulatory Visit | Attending: Family Medicine | Admitting: Family Medicine

## 2014-10-31 ENCOUNTER — Encounter: Payer: Self-pay | Admitting: Family Medicine

## 2014-10-31 VITALS — BP 120/82 | HR 70 | Temp 98.2°F | Resp 14 | Ht 67.0 in | Wt 166.0 lb

## 2014-10-31 DIAGNOSIS — R5382 Chronic fatigue, unspecified: Secondary | ICD-10-CM | POA: Diagnosis not present

## 2014-10-31 DIAGNOSIS — M5136 Other intervertebral disc degeneration, lumbar region: Secondary | ICD-10-CM

## 2014-10-31 DIAGNOSIS — F17200 Nicotine dependence, unspecified, uncomplicated: Secondary | ICD-10-CM

## 2014-10-31 DIAGNOSIS — E785 Hyperlipidemia, unspecified: Secondary | ICD-10-CM

## 2014-10-31 DIAGNOSIS — Z720 Tobacco use: Secondary | ICD-10-CM

## 2014-10-31 DIAGNOSIS — J449 Chronic obstructive pulmonary disease, unspecified: Secondary | ICD-10-CM | POA: Diagnosis present

## 2014-10-31 DIAGNOSIS — G2581 Restless legs syndrome: Secondary | ICD-10-CM | POA: Diagnosis not present

## 2014-10-31 DIAGNOSIS — C61 Malignant neoplasm of prostate: Secondary | ICD-10-CM | POA: Diagnosis not present

## 2014-10-31 DIAGNOSIS — R634 Abnormal weight loss: Secondary | ICD-10-CM | POA: Diagnosis not present

## 2014-10-31 DIAGNOSIS — I739 Peripheral vascular disease, unspecified: Secondary | ICD-10-CM | POA: Insufficient documentation

## 2014-10-31 LAB — COMPREHENSIVE METABOLIC PANEL
ALT: 10 U/L (ref 9–46)
AST: 13 U/L (ref 10–35)
Albumin: 4.3 g/dL (ref 3.6–5.1)
Alkaline Phosphatase: 55 U/L (ref 40–115)
BUN: 10 mg/dL (ref 7–25)
CO2: 25 mmol/L (ref 20–31)
Calcium: 9.3 mg/dL (ref 8.6–10.3)
Chloride: 106 mmol/L (ref 98–110)
Creat: 0.81 mg/dL (ref 0.70–1.25)
Glucose, Bld: 97 mg/dL (ref 70–99)
Potassium: 4.2 mmol/L (ref 3.5–5.3)
Sodium: 139 mmol/L (ref 135–146)
Total Bilirubin: 0.4 mg/dL (ref 0.2–1.2)
Total Protein: 6.5 g/dL (ref 6.1–8.1)

## 2014-10-31 LAB — CBC WITH DIFFERENTIAL/PLATELET
Basophils Absolute: 0 10*3/uL (ref 0.0–0.1)
Basophils Relative: 0 % (ref 0–1)
Eosinophils Absolute: 0.1 10*3/uL (ref 0.0–0.7)
Eosinophils Relative: 1 % (ref 0–5)
HCT: 40.4 % (ref 39.0–52.0)
Hemoglobin: 13.2 g/dL (ref 13.0–17.0)
Lymphocytes Relative: 17 % (ref 12–46)
Lymphs Abs: 1 10*3/uL (ref 0.7–4.0)
MCH: 31.3 pg (ref 26.0–34.0)
MCHC: 32.7 g/dL (ref 30.0–36.0)
MCV: 95.7 fL (ref 78.0–100.0)
MPV: 10.4 fL (ref 8.6–12.4)
Monocytes Absolute: 0.4 10*3/uL (ref 0.1–1.0)
Monocytes Relative: 8 % (ref 3–12)
Neutro Abs: 4.1 10*3/uL (ref 1.7–7.7)
Neutrophils Relative %: 74 % (ref 43–77)
Platelets: 233 10*3/uL (ref 150–400)
RBC: 4.22 MIL/uL (ref 4.22–5.81)
RDW: 14.6 % (ref 11.5–15.5)
WBC: 5.6 10*3/uL (ref 4.0–10.5)

## 2014-10-31 LAB — LIPID PANEL
Cholesterol: 154 mg/dL (ref 125–200)
HDL: 54 mg/dL (ref >=40)
LDL Cholesterol: 87 mg/dL (ref <130)
Total CHOL/HDL Ratio: 2.9 Ratio (ref <=5.0)
Triglycerides: 65 mg/dL (ref <150)
VLDL: 13 mg/dL (ref <30)

## 2014-10-31 LAB — TSH: TSH: 1.011 u[IU]/mL (ref 0.350–4.500)

## 2014-10-31 MED ORDER — HYDROCODONE-ACETAMINOPHEN 10-325 MG PO TABS: 1.0000 | ORAL_TABLET | ORAL | Status: DC | PRN

## 2014-10-31 MED ORDER — ROPINIROLE HCL 1 MG PO TABS: ORAL_TABLET | ORAL | Status: DC

## 2014-10-31 MED ORDER — PANTOPRAZOLE SODIUM 40 MG PO TBEC: 40.0000 mg | DELAYED_RELEASE_TABLET | Freq: Every day | ORAL | Status: DC

## 2014-10-31 NOTE — Patient Instructions (Signed)
Get the chest xray done We will call with labs Add Boost or supplement Plan for vascular this winter F/U 6 months- Physical

## 2014-10-31 NOTE — Assessment & Plan Note (Signed)
I query if some of his leg symptoms are related to his PVD, discussed need for F/U with vascular due to fiances will not be able to do this until this winter For now increase Requip to 1mg , also has norco

## 2014-10-31 NOTE — Assessment & Plan Note (Signed)
Unchnaged, chronic problem and complain for  > 1 year

## 2014-10-31 NOTE — Progress Notes (Signed)
Patient ID: Cory Foley, male   DOB: 20-Apr-1950, 64 y.o.   MRN: [id number]   Subjective:    Patient ID: Cory Foley, male    DOB: 1950-02-26, 65 y.o.   MRN: [id number]  Patient presents for 6 month F/U; Fatigue; and Restless Leg Syndrome  Pt here to   follow-up chronic medical problems. He continues to complain of chronic fatigue which he is A greater than 1 year since his diagnosis of prostate cancer. He also continues to have problems with his restless legs if the medicine helps some but then it wears off. He is history of peripheral vascular disease denies any symptoms during the daytime but he did miss his follow-up he had intervention on his vessels with bypass in the past. His weight is down 8 pounds since her last visit he states he continues to have no appetite in just does not eat very much. Denies any pain with eating or change in his bowels. He still has the chronic back pain and tailbone pain and occasionally suprapubic pain. He does continue to smoke upper to repack a day.   Efficacious were reviewed.    Review Of Systems:  GEN- + fatigue, fever, +weight loss,weakness, recent illness HEENT- denies eye drainage, change in vision, nasal discharge, CVS- denies chest pain, palpitations RESP- denies SOB, cough, wheeze ABD- denies N/V, change in stools, abd pain GU- denies dysuria, hematuria, dribbling, incontinence MSK- + joint pain, muscle aches, injury Neuro- denies headache, dizziness, syncope, seizure activity       Objective:    BP 120/82 mmHg  Pulse 70  Temp(Src) 98.2 F (36.8 C) (Oral)  Resp 14  Ht 5\' 7"  (1.702 m)  Wt 166 lb (75.297 kg)  BMI 25.99 kg/m2 GEN- NAD, alert and oriented x3 HEENT- PERRL, EOMI, non injected sclera, pink conjunctiva, MMM, oropharynx clear Neck- Supple, no thyromegaly CVS- RRR, no murmur RESP-CTAB ABD-NABS,soft,NT,ND EXT- No edema Pulses- Radial, DP- 2+        Assessment & Plan:      Problem List Items Addressed This Visit     Weight loss    PSA is normal Colonoscopy UTD He is a smoker, therefore will obtain CXR Check labs I think this may be MTF, he has this decreased appetite, fatigue, stressors at home Add Boost supplements      Relevant Orders   CBC with Differential/Platelet   Comprehensive metabolic panel   TSH   DG Chest 2 View   TOBACCO ABUSE - Primary    Counsled on cessation, he is not ready to quit      Relevant Orders   DG Chest 2 View   RLS (restless legs syndrome)    I query if some of his leg symptoms are related to his PVD, discussed need for F/U with vascular due to fiances will not be able to do this until this winter For now increase Requip to 1mg , also has norco      PVD (peripheral vascular disease)   Hyperlipidemia   Relevant Orders   Lipid panel   DDD (degenerative disc disease), lumbar   Relevant Medications   HYDROcodone-acetaminophen (NORCO) 10-325 MG per tablet   Chronic fatigue    Unchnaged, chronic problem and complain for  > 1 year      Relevant Orders   CBC with Differential/Platelet   Comprehensive metabolic panel      Note: This dictation was prepared with Dragon dictation along with smaller phrase technology. Any transcriptional errors that result  from this process are unintentional.

## 2014-10-31 NOTE — Assessment & Plan Note (Addendum)
PSA is normal Colonoscopy UTD He is a smoker, therefore will obtain CXR Check labs I think this may be MTF, he has this decreased appetite, fatigue, stressors at home Add Boost supplements

## 2014-10-31 NOTE — Assessment & Plan Note (Signed)
Counsled on cessation, he is not ready to quit

## 2014-11-02 ENCOUNTER — Encounter: Payer: Self-pay | Admitting: *Deleted

## 2014-12-17 ENCOUNTER — Telehealth: Payer: Self-pay | Admitting: Family Medicine

## 2014-12-17 MED ORDER — HYDROCODONE-ACETAMINOPHEN 10-325 MG PO TABS: 1.0000 | ORAL_TABLET | ORAL | Status: DC | PRN

## 2014-12-17 NOTE — Telephone Encounter (Signed)
Ok to refill??  Last office visit/ refill 10/31/2014.

## 2014-12-17 NOTE — Telephone Encounter (Signed)
Prescription printed and patient made aware to come to office to pick up on 12/18/2014.

## 2014-12-17 NOTE — Telephone Encounter (Signed)
Okay 

## 2014-12-17 NOTE — Telephone Encounter (Signed)
Patient is calling to get rx for his hydrocodone [phone]

## 2014-12-25 ENCOUNTER — Other Ambulatory Visit: Payer: Self-pay | Admitting: Family Medicine

## 2014-12-25 NOTE — Telephone Encounter (Signed)
Refill appropriate and filled per protocol. 

## 2015-01-09 ENCOUNTER — Ambulatory Visit (INDEPENDENT_AMBULATORY_CARE_PROVIDER_SITE_OTHER): Payer: Medicare Other | Admitting: Family Medicine

## 2015-01-09 ENCOUNTER — Ambulatory Visit: Payer: PRIVATE HEALTH INSURANCE | Admitting: Family Medicine

## 2015-01-09 ENCOUNTER — Telehealth: Payer: Self-pay | Admitting: *Deleted

## 2015-01-09 ENCOUNTER — Encounter: Payer: Self-pay | Admitting: Family Medicine

## 2015-01-09 VITALS — BP 130/78 | HR 82 | Temp 98.5°F | Resp 16 | Ht 67.0 in | Wt 152.0 lb

## 2015-01-09 DIAGNOSIS — R1030 Lower abdominal pain, unspecified: Secondary | ICD-10-CM

## 2015-01-09 DIAGNOSIS — R634 Abnormal weight loss: Secondary | ICD-10-CM

## 2015-01-09 DIAGNOSIS — C61 Malignant neoplasm of prostate: Secondary | ICD-10-CM

## 2015-01-09 LAB — BASIC METABOLIC PANEL
BUN: 10 mg/dL (ref 7–25)
CO2: 25 mmol/L (ref 20–31)
Calcium: 9.2 mg/dL (ref 8.6–10.3)
Chloride: 107 mmol/L (ref 98–110)
Creat: 0.91 mg/dL (ref 0.70–1.25)
Glucose, Bld: 96 mg/dL (ref 70–99)
Potassium: 4.5 mmol/L (ref 3.5–5.3)
Sodium: 139 mmol/L (ref 135–146)

## 2015-01-09 MED ORDER — HYDROCODONE-ACETAMINOPHEN 10-325 MG PO TABS
1.0000 | ORAL_TABLET | ORAL | Status: DC | PRN
Start: 2015-01-09 — End: 2015-02-26

## 2015-01-09 MED ORDER — MIRTAZAPINE 15 MG PO TABS: 15.0000 mg | ORAL_TABLET | Freq: Every day | ORAL | Status: DC

## 2015-01-09 MED ORDER — SIMVASTATIN 20 MG PO TABS: ORAL_TABLET | ORAL | Status: DC

## 2015-01-09 NOTE — Progress Notes (Signed)
Patient ID: Cory Foley, male   DOB: 12-29-1950, 64 y.o.   MRN: [id number]   Subjective:    Patient ID: Cory Foley, male    DOB: April 27, 1950, 64 y.o.   MRN: [id number]  Patient presents for 6 month F/U  patient was here for a follow-up however this was appointment that was an  error. On discussion of any other  concerns he states that he continues to lose weight. Over the past year he is now down 20 pounds unintentionally. He states he has no appetite when he tries Eat he has abdominal pain mostly in the lower abdomen above his pelvis therefore he is afraid eat at times. Yesterday he only ate a barbecue sandwich. He denies any nausea vomiting or change in his bowels. He had recent labs about 8 weeks ago which were all normal. He is drinking boost twice a day. He does have history of prostate cancer which is not on active treatment for it this time. CXR done for his weight loss a couple months ago which was negative    Review Of Systems:  GEN- denies fatigue, fever, +weight loss,weakness, recent illness HEENT- denies eye drainage, change in vision, nasal discharge, CVS- denies chest pain, palpitations RESP- denies SOB, cough, wheeze ABD- denies N/V, change in stools, abd pain GU- denies dysuria, hematuria, dribbling, incontinence MSK- denies joint pain, muscle aches, injury Neuro- denies headache, dizziness, syncope, seizure activity       Objective:    BP 130/78 mmHg  Pulse 82  Temp(Src) 98.5 F (36.9 C) (Oral)  Resp 16  Ht 5\' 7"  (1.702 m)  Wt 152 lb (68.947 kg)  BMI 23.80 kg/m2 GEN- NAD, alert and oriented x3, weight loss  HEENT- PERRL, EOMI, non injected sclera, pink conjunctiva, MMM, oropharynx clear Neck- Supple, no thyromegaly CVS- RRR, no murmur RESP-CTAB ABD-NABS,soft,NT,ND EXT- No edema Pulses- Radial 2+        Assessment & Plan:      Problem List Items Addressed This Visit    Weight loss - Primary    Weight loss with early satiety and lower abdominal  pain. His history of prostate cancer. I'm been obtain a CT of abdomen and pelvis if this is negative then I will refer him to GI. He had recent labs and a PSA was drawn negative. His weight loss has been a steady decline over the past year. I'm also get a start him on Remeron 15 mg at bedtime. He will continue drinking boost twice a day      Relevant Orders   Basic metabolic panel   CT Abdomen Pelvis W Contrast   Prostate cancer ([place])   Relevant Medications   HYDROcodone-acetaminophen (NORCO) 10-325 MG tablet    Other Visit Diagnoses    Lower abdominal pain           Note: This dictation was prepared with Dragon dictation along with smaller phrase technology. Any transcriptional errors that result from this process are unintentional.

## 2015-01-09 NOTE — Telephone Encounter (Signed)
Pt has appt scheduled for Mon Nov 21 at 12:45pm- pt needs to arrive 24mins early, pt needs to come by office or [facility] to pick up Contrast. [facility] to pt

## 2015-01-09 NOTE — Patient Instructions (Signed)
CT abdomen Pelvis to be done for weight loss  Start the remeron at bedtime Referral to GI  F/U as previous

## 2015-01-09 NOTE — Assessment & Plan Note (Signed)
Weight loss with early satiety and lower abdominal pain. His history of prostate cancer. I'm been obtain a CT of abdomen and pelvis if this is negative then I will refer him to GI. He had recent labs and a PSA was drawn negative. His weight loss has been a steady decline over the past year. I'm also get a start him on Remeron 15 mg at bedtime. He will continue drinking boost twice a day

## 2015-01-09 NOTE — Telephone Encounter (Signed)
Gave information to pt's wife while in office

## 2015-01-14 ENCOUNTER — Ambulatory Visit (HOSPITAL_COMMUNITY)
Admission: RE | Admit: 2015-01-14 | Discharge: 2015-01-14 | Disposition: A | Discharge status: Home / Self Care | Payer: Medicare Other | Source: Ambulatory Visit | Attending: Family Medicine | Admitting: Family Medicine

## 2015-01-14 DIAGNOSIS — R634 Abnormal weight loss: Secondary | ICD-10-CM | POA: Diagnosis not present

## 2015-01-14 DIAGNOSIS — R6881 Early satiety: Secondary | ICD-10-CM | POA: Diagnosis not present

## 2015-01-14 DIAGNOSIS — R109 Unspecified abdominal pain: Secondary | ICD-10-CM | POA: Diagnosis not present

## 2015-01-14 DIAGNOSIS — Z8546 Personal history of malignant neoplasm of prostate: Secondary | ICD-10-CM | POA: Diagnosis not present

## 2015-01-14 DIAGNOSIS — Z923 Personal history of irradiation: Secondary | ICD-10-CM | POA: Diagnosis not present

## 2015-01-14 DIAGNOSIS — E279 Disorder of adrenal gland, unspecified: Secondary | ICD-10-CM | POA: Insufficient documentation

## 2015-01-14 MED ORDER — IOHEXOL 300 MG/ML  SOLN
100.0000 mL | Freq: Once | INTRAMUSCULAR | Status: AC | PRN
  Administered 2015-01-14: 100 mL via INTRAVENOUS

## 2015-02-26 ENCOUNTER — Telehealth: Payer: Self-pay | Admitting: *Deleted

## 2015-02-26 MED ORDER — HYDROCODONE-ACETAMINOPHEN 10-325 MG PO TABS: 1.0000 | ORAL_TABLET | ORAL | Status: DC | PRN

## 2015-02-26 NOTE — Telephone Encounter (Signed)
Okay to refill, pain meds   Okay to refill wifes pain meds- Cory Foley

## 2015-02-26 NOTE — Telephone Encounter (Signed)
Prescription printed and patient made aware to come to office to pick up.   Patient has appointment on Friday.

## 2015-02-26 NOTE — Telephone Encounter (Signed)
Received call from patient wife, Holley Raring.   Requested refill on Hydrocodone.   Ok to refill??  Last office visit/ refill 01/09/2015.

## 2015-03-27 ENCOUNTER — Other Ambulatory Visit: Payer: Self-pay | Admitting: Family Medicine

## 2015-03-27 MED ORDER — HYDROCODONE-ACETAMINOPHEN 10-325 MG PO TABS: 1.0000 | ORAL_TABLET | ORAL | Status: DC | PRN

## 2015-03-27 NOTE — Telephone Encounter (Signed)
LRF 03/08/15  #120  LOV 01/09/15  OK refill?

## 2015-03-27 NOTE — Telephone Encounter (Signed)
rx ready and pt aware 

## 2015-03-27 NOTE — Telephone Encounter (Signed)
Okay to refill? 

## 2015-03-27 NOTE — Telephone Encounter (Signed)
Patient calling to get rx for his hydrocodone  [phone]

## 2015-04-29 ENCOUNTER — Other Ambulatory Visit: Payer: Medicare Other

## 2015-04-29 DIAGNOSIS — E785 Hyperlipidemia, unspecified: Secondary | ICD-10-CM

## 2015-04-29 DIAGNOSIS — Z125 Encounter for screening for malignant neoplasm of prostate: Secondary | ICD-10-CM | POA: Diagnosis not present

## 2015-04-29 DIAGNOSIS — M8580 Other specified disorders of bone density and structure, unspecified site: Secondary | ICD-10-CM | POA: Diagnosis not present

## 2015-04-29 DIAGNOSIS — Z720 Tobacco use: Secondary | ICD-10-CM

## 2015-04-29 DIAGNOSIS — M810 Age-related osteoporosis without current pathological fracture: Secondary | ICD-10-CM | POA: Diagnosis not present

## 2015-04-29 DIAGNOSIS — Z7960 Long term (current) use of unspecified immunomodulators and immunosuppressants: Secondary | ICD-10-CM

## 2015-04-29 DIAGNOSIS — Z0000 Encounter for general adult medical examination without abnormal findings: Secondary | ICD-10-CM

## 2015-04-29 DIAGNOSIS — R7302 Impaired glucose tolerance (oral): Secondary | ICD-10-CM

## 2015-04-29 DIAGNOSIS — Z79899 Other long term (current) drug therapy: Secondary | ICD-10-CM

## 2015-04-29 LAB — COMPLETE METABOLIC PANEL WITH GFR
ALT: 18 U/L (ref 9–46)
AST: 18 U/L (ref 10–35)
Albumin: 4 g/dL (ref 3.6–5.1)
Alkaline Phosphatase: 62 U/L (ref 40–115)
BUN: 11 mg/dL (ref 7–25)
CO2: 26 mmol/L (ref 20–31)
Calcium: 9.2 mg/dL (ref 8.6–10.3)
Chloride: 106 mmol/L (ref 98–110)
Creat: 0.78 mg/dL (ref 0.70–1.25)
GFR, Est African American: >89 mL/min (ref >=60)
GFR, Est Non African American: >89 mL/min (ref >=60)
Glucose, Bld: 96 mg/dL (ref 70–99)
Potassium: 4.9 mmol/L (ref 3.5–5.3)
Sodium: 141 mmol/L (ref 135–146)
Total Bilirubin: 0.3 mg/dL (ref 0.2–1.2)
Total Protein: 6.4 g/dL (ref 6.1–8.1)

## 2015-04-29 LAB — CBC WITH DIFFERENTIAL/PLATELET
Basophils Absolute: 0 10*3/uL (ref 0.0–0.1)
Basophils Relative: 0 % (ref 0–1)
Eosinophils Absolute: 0.2 10*3/uL (ref 0.0–0.7)
Eosinophils Relative: 4 % (ref 0–5)
HCT: 41.3 % (ref 39.0–52.0)
Hemoglobin: 13.4 g/dL (ref 13.0–17.0)
Lymphocytes Relative: 23 % (ref 12–46)
Lymphs Abs: 1.1 10*3/uL (ref 0.7–4.0)
MCH: 30.9 pg (ref 26.0–34.0)
MCHC: 32.4 g/dL (ref 30.0–36.0)
MCV: 95.4 fL (ref 78.0–100.0)
MPV: 10.4 fL (ref 8.6–12.4)
Monocytes Absolute: 0.5 10*3/uL (ref 0.1–1.0)
Monocytes Relative: 10 % (ref 3–12)
Neutro Abs: 2.9 10*3/uL (ref 1.7–7.7)
Neutrophils Relative %: 63 % (ref 43–77)
Platelets: 236 10*3/uL (ref 150–400)
RBC: 4.33 MIL/uL (ref 4.22–5.81)
RDW: 15 % (ref 11.5–15.5)
WBC: 4.6 10*3/uL (ref 4.0–10.5)

## 2015-04-29 LAB — TSH: TSH: 1.97 mIU/L (ref 0.40–4.50)

## 2015-04-29 LAB — LIPID PANEL
Cholesterol: 202 mg/dL — ABNORMAL HIGH (ref 125–200)
HDL: 54 mg/dL (ref >=40)
LDL Cholesterol: 126 mg/dL (ref <130)
Total CHOL/HDL Ratio: 3.7 Ratio (ref <=5.0)
Triglycerides: 111 mg/dL (ref <150)
VLDL: 22 mg/dL (ref <30)

## 2015-04-29 LAB — HEMOGLOBIN A1C
Hgb A1c MFr Bld: 6 % — ABNORMAL HIGH (ref <5.7)
Mean Plasma Glucose: 126 mg/dL — ABNORMAL HIGH (ref <117)

## 2015-04-30 LAB — VITAMIN D 25 HYDROXY (VIT D DEFICIENCY, FRACTURES): Vit D, 25-Hydroxy: 37 ng/mL (ref 30–100)

## 2015-04-30 LAB — PSA, MEDICARE: PSA: 0.01 ng/mL (ref <=4.00)

## 2015-05-01 ENCOUNTER — Encounter: Payer: Self-pay | Admitting: Family Medicine

## 2015-05-01 ENCOUNTER — Ambulatory Visit (INDEPENDENT_AMBULATORY_CARE_PROVIDER_SITE_OTHER): Payer: Medicare Other | Admitting: Family Medicine

## 2015-05-01 VITALS — BP 130/80 | HR 74 | Temp 98.5°F | Resp 18 | Wt 174.0 lb

## 2015-05-01 DIAGNOSIS — M5136 Other intervertebral disc degeneration, lumbar region: Secondary | ICD-10-CM | POA: Diagnosis not present

## 2015-05-01 DIAGNOSIS — R7302 Impaired glucose tolerance (oral): Secondary | ICD-10-CM | POA: Diagnosis not present

## 2015-05-01 DIAGNOSIS — E785 Hyperlipidemia, unspecified: Secondary | ICD-10-CM | POA: Diagnosis not present

## 2015-05-01 DIAGNOSIS — I739 Peripheral vascular disease, unspecified: Secondary | ICD-10-CM | POA: Diagnosis not present

## 2015-05-01 DIAGNOSIS — M810 Age-related osteoporosis without current pathological fracture: Secondary | ICD-10-CM | POA: Diagnosis not present

## 2015-05-01 DIAGNOSIS — R634 Abnormal weight loss: Secondary | ICD-10-CM | POA: Diagnosis not present

## 2015-05-01 DIAGNOSIS — F17200 Nicotine dependence, unspecified, uncomplicated: Secondary | ICD-10-CM

## 2015-05-01 DIAGNOSIS — Z0000 Encounter for general adult medical examination without abnormal findings: Secondary | ICD-10-CM | POA: Diagnosis not present

## 2015-05-01 MED ORDER — ZOSTER VACCINE LIVE 19400 UNT/0.65ML ~~LOC~~ SOLR: 0.6500 mL | Freq: Once | SUBCUTANEOUS | Status: DC

## 2015-05-01 MED ORDER — SIMVASTATIN 40 MG PO TABS: ORAL_TABLET | ORAL | Status: DC

## 2015-05-01 MED ORDER — MIRTAZAPINE 15 MG PO TABS: 7.5000 mg | ORAL_TABLET | Freq: Every day | ORAL | Status: DC

## 2015-05-01 MED ORDER — HYDROCODONE-ACETAMINOPHEN 10-325 MG PO TABS: 1.0000 | ORAL_TABLET | ORAL | Status: DC | PRN

## 2015-05-01 MED ORDER — ROPINIROLE HCL 1 MG PO TABS: ORAL_TABLET | ORAL | Status: DC

## 2015-05-01 MED ORDER — PANTOPRAZOLE SODIUM 40 MG PO TBEC: 40.0000 mg | DELAYED_RELEASE_TABLET | Freq: Every day | ORAL | Status: DC

## 2015-05-01 NOTE — Progress Notes (Signed)
Patient ID: SAVIER KADLEC, male   DOB: [birth year], 65 y.o.   MRN: [id number]   Subjective:    Patient ID: [name], male    DOB: 10-26-1950, 65 y.o.   MRN: [id number] Subjective:   Patient presents for Medicare Annual/Subsequent preventive examination.    Pt here for CPE   History of prostate cancer no on her surveillance by urology. He is on long-term disability because of his complications around his prostate cancer.   His colonoscopy is up-to-date, is due for tetanus booster  Declines flu shot , PNA vaccine UTD due to smoking His fasting labs were reviewed at bedside At her last visit in November he was being worked up for weight loss. Chest x-ray and CT can was obtained which was unremarkable. He was having difficulty and pain with eating. I recommended that he go see gastroenterology but he declined Due for repeat Bone Density due to Osteoporosis, currently on Fosamax  Review Past Medical/Family/Social: Per EMR   Risk Factors  Current exercise habits: none Dietary issues discussed: yes  Cardiac risk factors: Obesity (BMI >= 30 kg/m2). PVD, borderline DM, hyperlipidemia  Depression Screen  (Note: if answer to either of the following is "Yes", a more complete depression screening is indicated)  Over the past two weeks, have you felt down, depressed or hopeless? No Over the past two weeks, have you felt little interest or pleasure in doing things? No Have you lost interest or pleasure in daily life? No Do you often feel hopeless? No Do you cry easily over simple problems? No   Activities of Daily Living  In your present state of health, do you have any difficulty performing the following activities?:  Driving? No  Managing money? No  Feeding yourself? No  Getting from bed to chair? No  Climbing a flight of stairs? No  Preparing food and eating?: No  Bathing or showering? No  Getting dressed: No  Getting to the toilet? No  Using the toilet:No  Moving around from place  to place: No  In the past year have you fallen or had a near fall?:No  Are you sexually active? No  Do you have more than one partner? No   Hearing Difficulties: No  Do you often ask people to speak up or repeat themselves? No  Do you experience ringing or noises in your ears? yes Do you have difficulty understanding soft or whispered voices? No  Do you feel that you have a problem with memory? No Do you often misplace items? No  Do you feel safe at home? Yes  Cognitive Testing  Alert? Yes Normal Appearance?Yes  Oriented to person? Yes Place? Yes  Time? Yes  Recall of three objects? Yes  Can perform simple calculations? Yes  Displays appropriate judgment?Yes  Can read the correct time from a watch face?Yes   List the Names of Other Physician/Practitioners you currently use: Urology  Screening Tests / Date Colonoscopy   UTD                  Zostavax -Due  Influenza Vaccine -Declines  Tetanus/tdap- Due but insurance    Review Of Systems:  GEN- +fatigue, fever, weight loss,weakness, recent illness HEENT- denies eye drainage, change in vision, nasal discharge, CVS- denies chest pain, palpitations RESP- denies SOB, cough, wheeze ABD- denies N/V, change in stools, abd pain GU- denies dysuria, hematuria, dribbling, incontinence MSK- denies joint pain, muscle aches, injury Neuro- denies headache, dizziness, syncope, seizure activity  BP 130/80 mmHg  Pulse 74  Temp(Src) 98.5 F (36.9 C) (Oral)  Resp 18  Wt 174 lb (78.926 kg) GEN- NAD, alert and oriented x3,weight gain 20lbs HEENT- PERRL, EOMI, non injected sclera, pink conjunctiva, MMM, oropharynx clear Neck- Supple, no thyromegaly CVS- RRR, no murmur RESP-CTAB ABD-NABS,soft,NT,ND GU- deferred  Psych- normal affect and mood  EXT- No edema Pulses- Radial, DP- 2+     Assessment:    Annual wellness medicare exam   Plan:    During the course of the visit the patient was educated and counseled about appropriate  screening and preventive services including:   Shingles vaccine. Prescription given to that she can get the vaccine at the pharmacy or Medicare part D.   Depression screen- 8 - though chronic fatigue, sleep is long term issue, no medications needed at this time  He had recent CT abdomen- no evidence of AAA Diet review for nutrition referral? Yes ____ Not Indicated __x__  Patient Instructions (the written plan) was given to the patient.  Medicare Attestation  I have personally reviewed:  The patient's medical and social history  Their use of alcohol, tobacco or illicit drugs  Their current medications and supplements  The patient's functional ability including ADLs,fall risks, home safety risks, cognitive, and hearing and visual impairment  Diet and physical activities  Evidence for depression or mood disorders  The patient's weight, height, BMI, and visual acuity have been recorded in the chart. I have made referrals, counseling, and provided education to the patient based on review of the above and I have provided the patient with a written personalized care plan for preventive services.             Assessment & Plan:      Problem List Items Addressed This Visit    Weight loss     He is now regaining his 20 pounds weight loss however she's been eating a lot of junk food and snacks. Surprisingly his blood sugar looks okay that his cholesterol has gone up. I will decrease his Remeron to 7.5 mg we will likely stop this at the next visit      Hines on cessation he is not rate to quit      PVD (peripheral vascular disease) (H[facility])    History of PVD, LDL has gone pn, he is on zocor 20mg  Increase to 40mg   Arterial duplex to be done      Relevant Medications   simvastatin (ZOCOR) 40 MG tablet   Other Relevant Orders   [place] Lower Ext Art Bilat   Osteoporosis    Fosamax currently, vitamin D at goal Recheck bone density has been on therapy 1 year       Relevant Orders   DG Bone Density   Hyperlipidemia   Relevant Medications   simvastatin (ZOCOR) 40 MG tablet   Glucose intolerance (impaired glucose tolerance)    A1C improved ,continue to monitor      DDD (degenerative disc disease), lumbar    Norco given as needed      Relevant Medications   HYDROcodone-acetaminophen (NORCO) 10-325 MG tablet    Other Visit Diagnoses    Routine general medical examination at a health care facility    -  Primary       Note: This dictation was prepared with Dragon dictation along with smaller phrase technology. Any transcriptional errors that result from this process are unintentional.

## 2015-05-01 NOTE — Assessment & Plan Note (Signed)
Counseled on cessation he is not rate to quit

## 2015-05-01 NOTE — Assessment & Plan Note (Signed)
He is now regaining his 20 pounds weight loss however she's been eating a lot of junk food and snacks. Surprisingly his blood sugar looks okay that his cholesterol has gone up. I will decrease his Remeron to 7.5 mg we will likely stop this at the next visit

## 2015-05-01 NOTE — Assessment & Plan Note (Addendum)
History of PVD, LDL has gone pn, he is on zocor 20mg  Increase to 40mg   Arterial duplex to be done

## 2015-05-01 NOTE — Patient Instructions (Addendum)
Bone Density due Increase zocor to 40mg  Work on quitting smoking! Shingles vaccine and TDAP sent to pharmacy  Arterial duplex to be done  Take 1/2 tablet of remeron at beditme  F/U 4 months

## 2015-05-01 NOTE — Assessment & Plan Note (Signed)
A1C improved ,continue to monitor

## 2015-05-01 NOTE — Assessment & Plan Note (Signed)
Norco given as needed

## 2015-05-01 NOTE — Assessment & Plan Note (Signed)
Fosamax currently, vitamin D at goal Recheck bone density has been on therapy 1 year

## 2015-05-09 ENCOUNTER — Ambulatory Visit (HOSPITAL_COMMUNITY)
Admission: RE | Admit: 2015-05-09 | Discharge: 2015-05-09 | Disposition: A | Discharge status: Home / Self Care | Payer: Medicare Other | Source: Ambulatory Visit | Attending: Family Medicine | Admitting: Family Medicine

## 2015-05-09 DIAGNOSIS — Z95828 Presence of other vascular implants and grafts: Secondary | ICD-10-CM | POA: Diagnosis not present

## 2015-05-09 DIAGNOSIS — I739 Peripheral vascular disease, unspecified: Secondary | ICD-10-CM | POA: Diagnosis not present

## 2015-05-09 DIAGNOSIS — M810 Age-related osteoporosis without current pathological fracture: Secondary | ICD-10-CM | POA: Insufficient documentation

## 2015-05-15 ENCOUNTER — Other Ambulatory Visit: Payer: Self-pay | Admitting: *Deleted

## 2015-05-15 MED ORDER — CALCIUM CITRATE-VITAMIN D 250-100 MG-UNIT PO TABS: 1.0000 | ORAL_TABLET | Freq: Two times a day (BID) | ORAL | Status: DC

## 2015-05-22 ENCOUNTER — Encounter (HOSPITAL_COMMUNITY)
Admission: RE | Admit: 2015-05-22 | Discharge: 2015-05-22 | Disposition: A | Discharge status: Home / Self Care | Payer: Medicare Other | Source: Ambulatory Visit | Attending: Family Medicine | Admitting: Family Medicine

## 2015-05-22 DIAGNOSIS — M810 Age-related osteoporosis without current pathological fracture: Secondary | ICD-10-CM | POA: Diagnosis not present

## 2015-05-22 MED ORDER — ZOLEDRONIC ACID 5 MG/100ML IV SOLN
5.0000 mg | Freq: Once | INTRAVENOUS | Status: AC
  Administered 2015-05-22: 5 mg via INTRAVENOUS
  Filled 2015-05-22: qty 100

## 2015-05-22 MED ORDER — SODIUM CHLORIDE 0.9 % IV SOLN
Freq: Once | INTRAVENOUS | Status: AC
  Administered 2015-05-22: 10 mL/h via INTRAVENOUS

## 2015-05-27 ENCOUNTER — Telehealth: Payer: Self-pay | Admitting: Family Medicine

## 2015-05-27 MED ORDER — HYDROCODONE-ACETAMINOPHEN 10-325 MG PO TABS: 1.0000 | ORAL_TABLET | ORAL | Status: DC | PRN

## 2015-05-27 NOTE — Telephone Encounter (Signed)
Ok to refill??  Last office visit/ refill 05/01/2015.

## 2015-05-27 NOTE — Telephone Encounter (Signed)
Prescription printed and patient made aware to come to office to pick up on 05/28/2015.

## 2015-05-27 NOTE — Telephone Encounter (Signed)
Patient calling to get rx for his hydrocodone  [phone]

## 2015-05-27 NOTE — Telephone Encounter (Signed)
Okay to refill? 

## 2015-06-21 ENCOUNTER — Other Ambulatory Visit: Payer: Self-pay | Admitting: *Deleted

## 2015-06-21 MED ORDER — HYDROCODONE-ACETAMINOPHEN 10-325 MG PO TABS: 1.0000 | ORAL_TABLET | ORAL | Status: DC | PRN

## 2015-06-21 NOTE — Telephone Encounter (Signed)
MD made aware and approved post dated prescription.   Prescription printed.

## 2015-06-21 NOTE — Telephone Encounter (Signed)
Patient in office with parent.   Requested refill of Hydrocodone to be picked up next week.   Ok to refill??  Last office visit 05/01/2015.   Last refill 05/27/2015.

## 2015-06-21 NOTE — Telephone Encounter (Signed)
Okay to refill? 

## 2015-07-18 ENCOUNTER — Telehealth: Payer: Self-pay | Admitting: Family Medicine

## 2015-07-18 MED ORDER — HYDROCODONE-ACETAMINOPHEN 10-325 MG PO TABS: 1.0000 | ORAL_TABLET | ORAL | Status: DC | PRN

## 2015-07-18 NOTE — Telephone Encounter (Signed)
Prescription printed and patient made aware to come to office to pick up on 07/19/2015. 

## 2015-07-18 NOTE — Telephone Encounter (Signed)
Okay to refill? 

## 2015-07-18 NOTE — Telephone Encounter (Signed)
Ok to refill??  Last office visit 05/01/2015.  Last refill 06/21/2015.

## 2015-07-18 NOTE — Telephone Encounter (Signed)
Patient's wife calling requesting a refill on his HYDROcodone-acetaminophen ([place]) 10-325 MG  She has an appointment for lab work tomorrow and would like to pick up at that time.  CB# [phone]

## 2015-08-19 ENCOUNTER — Other Ambulatory Visit: Payer: Self-pay | Admitting: Family Medicine

## 2015-08-21 ENCOUNTER — Telehealth: Payer: Self-pay | Admitting: Family Medicine

## 2015-08-21 MED ORDER — HYDROCODONE-ACETAMINOPHEN 10-325 MG PO TABS: 1.0000 | ORAL_TABLET | ORAL | Status: DC | PRN

## 2015-08-21 NOTE — Telephone Encounter (Signed)
Prescription printed and patient made aware to come to office to pick up after 2pm on 08/22/2015.

## 2015-08-21 NOTE — Telephone Encounter (Signed)
Ok to refill??      LOV - CPE - 05/01/15 I will decrease his Remeron to 7.5 mg we will likely stop this at the next visit  LRF - 05/01/15 #30/1

## 2015-08-21 NOTE — Telephone Encounter (Signed)
Okay to refill? 

## 2015-08-21 NOTE — Telephone Encounter (Signed)
Ok to refill??  Last office visit 05/01/2015.  Last refill 07/18/2015.

## 2015-08-21 NOTE — Telephone Encounter (Signed)
Pt is requesting a refill of Hydrocodone 10-325\ [phone]

## 2015-08-22 NOTE — Telephone Encounter (Signed)
Prescription sent to pharmacy.

## 2015-09-03 ENCOUNTER — Encounter: Payer: Self-pay | Admitting: Family Medicine

## 2015-09-03 ENCOUNTER — Ambulatory Visit (INDEPENDENT_AMBULATORY_CARE_PROVIDER_SITE_OTHER): Payer: Medicare Other | Admitting: Family Medicine

## 2015-09-03 VITALS — BP 126/76 | HR 64 | Temp 98.9°F | Resp 16 | Ht 68.0 in | Wt 176.0 lb

## 2015-09-03 DIAGNOSIS — F17200 Nicotine dependence, unspecified, uncomplicated: Secondary | ICD-10-CM | POA: Diagnosis not present

## 2015-09-03 DIAGNOSIS — M810 Age-related osteoporosis without current pathological fracture: Secondary | ICD-10-CM

## 2015-09-03 DIAGNOSIS — M5136 Other intervertebral disc degeneration, lumbar region: Secondary | ICD-10-CM

## 2015-09-03 DIAGNOSIS — E785 Hyperlipidemia, unspecified: Secondary | ICD-10-CM

## 2015-09-03 DIAGNOSIS — R7302 Impaired glucose tolerance (oral): Secondary | ICD-10-CM | POA: Diagnosis not present

## 2015-09-03 DIAGNOSIS — J42 Unspecified chronic bronchitis: Secondary | ICD-10-CM | POA: Diagnosis not present

## 2015-09-03 DIAGNOSIS — M51369 Other intervertebral disc degeneration, lumbar region without mention of lumbar back pain or lower extremity pain: Secondary | ICD-10-CM

## 2015-09-03 LAB — COMPREHENSIVE METABOLIC PANEL
ALT: 14 U/L (ref 9–46)
AST: 16 U/L (ref 10–35)
Albumin: 4 g/dL (ref 3.6–5.1)
Alkaline Phosphatase: 52 U/L (ref 40–115)
BUN: 11 mg/dL (ref 7–25)
CO2: 24 mmol/L (ref 20–31)
Calcium: 9 mg/dL (ref 8.6–10.3)
Chloride: 109 mmol/L (ref 98–110)
Creat: 0.91 mg/dL (ref 0.70–1.25)
Glucose, Bld: 84 mg/dL (ref 70–99)
Potassium: 4.6 mmol/L (ref 3.5–5.3)
Sodium: 139 mmol/L (ref 135–146)
Total Bilirubin: 0.3 mg/dL (ref 0.2–1.2)
Total Protein: 6.3 g/dL (ref 6.1–8.1)

## 2015-09-03 LAB — LIPID PANEL
Cholesterol: 168 mg/dL (ref 125–200)
HDL: 48 mg/dL (ref >=40)
LDL Cholesterol: 94 mg/dL (ref <130)
Total CHOL/HDL Ratio: 3.5 Ratio (ref <=5.0)
Triglycerides: 132 mg/dL (ref <150)
VLDL: 26 mg/dL (ref <30)

## 2015-09-03 LAB — CBC WITH DIFFERENTIAL/PLATELET
Basophils Absolute: 0 cells/uL (ref 0–200)
Basophils Relative: 0 %
Eosinophils Absolute: 90 cells/uL (ref 15–500)
Eosinophils Relative: 2 %
HCT: 39.9 % (ref 38.5–50.0)
Hemoglobin: 12.9 g/dL — ABNORMAL LOW (ref 13.0–17.0)
Lymphocytes Relative: 26 %
Lymphs Abs: 1170 cells/uL (ref 850–3900)
MCH: 30.7 pg (ref 27.0–33.0)
MCHC: 32.3 g/dL (ref 32.0–36.0)
MCV: 95 fL (ref 80.0–100.0)
MPV: 10.6 fL (ref 7.5–12.5)
Monocytes Absolute: 495 cells/uL (ref 200–950)
Monocytes Relative: 11 %
Neutro Abs: 2745 cells/uL (ref 1500–7800)
Neutrophils Relative %: 61 %
Platelets: 220 10*3/uL (ref 140–400)
RBC: 4.2 MIL/uL (ref 4.20–5.80)
RDW: 15.1 % — ABNORMAL HIGH (ref 11.0–15.0)
WBC: 4.5 10*3/uL (ref 3.8–10.8)

## 2015-09-03 LAB — HEMOGLOBIN A1C
Hgb A1c MFr Bld: 5.7 % — ABNORMAL HIGH (ref <5.7)
Mean Plasma Glucose: 117 mg/dL

## 2015-09-03 MED ORDER — HYDROCODONE-ACETAMINOPHEN 10-325 MG PO TABS: 1.0000 | ORAL_TABLET | ORAL | Status: DC | PRN

## 2015-09-03 MED ORDER — MIRTAZAPINE 15 MG PO TABS: ORAL_TABLET | ORAL | Status: DC

## 2015-09-03 MED ORDER — ALENDRONATE SODIUM 70 MG PO TABS: ORAL_TABLET | ORAL | Status: DC

## 2015-09-03 NOTE — Assessment & Plan Note (Signed)
Continue to work on tobacco cessation.  

## 2015-09-03 NOTE — Assessment & Plan Note (Signed)
Chronic back pain he does not want any surgical intervention therefore will not do any repeat imaging continue with hydrocodone

## 2015-09-03 NOTE — Patient Instructions (Addendum)
F/U 4 months Restart Fosamax We will call with lab results

## 2015-09-03 NOTE — Assessment & Plan Note (Signed)
Currently doing fair he will continue use albuterol as needed cutting out the tobacco well improve his COPD.

## 2015-09-03 NOTE — Progress Notes (Signed)
Patient ID: Cory Foley, male   DOB: Jul 08, 1950, 65 y.o.   MRN: [id number]   Subjective:    Patient ID: Cory Foley, male    DOB: [birth year], 64 y.o.   MRN: [id number]  Patient presents for 4 month F/U Patient for follow-up on chronic medical problems. No particular concerns. 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 His pain medication helps keep him mobile.  He has had problems with weight with the Remeron he's been maintaining his weight he also knows he is eating more as he is cutting down his cigarette use down down to 5 cigarettes per day. He uses his albuterol very sparingly.  Hyperlipidemia glucose intolerance he is due for recheck on these levels his cholesterol had increased by about 40 points and his LDL at his physical exam in March Note he has osteoporosis as well. He is asked to stop takingmax last year did not let me know he states that he forgot each week to take it readily. He has been taking vitamin D and a recent bone density done in March was essentially getting him set up for reclast and instead we will go back to his Fosamax    Review Of Systems:  GEN- denies fatigue, fever, weight loss,weakness, recent illness HEENT- denies eye drainage, change in vision, nasal discharge, CVS- denies chest pain, palpitations RESP- denies SOB, cough, wheeze ABD- denies N/V, change in stools, abd pain GU- denies dysuria, hematuria, dribbling, incontinence MSK- + joint pain, muscle aches, injury Neuro- denies headache, dizziness, syncope, seizure activity       Objective:    BP 126/76 mmHg  Pulse 64  Temp(Src) 98.9 F (37.2 C) (Oral)  Resp 16  Ht 5\' 8"  (1.727 m)  Wt 176 lb (79.833 kg)  BMI 26.77 kg/m2 GEN- NAD, alert and oriented x3 HEENT- PERRL, EOMI,  non injected sclera, pink conjunctiva, MMM, oropharynx clear CVS- RRR, no murmur RESP-CTAB EXT- No edema Pulses- Radial 2+        Assessment & Plan:      Problem List Items Addressed This Visit    None      Note: This dictation was prepared with Dragon dictation along with smaller phrase technology. Any transcriptional errors that result from this process are unintentional.

## 2015-09-03 NOTE — Assessment & Plan Note (Signed)
Recheck cholesterol levels continue simvastatin goal is LDL is 100 he has peripheral vascular disease

## 2015-10-07 ENCOUNTER — Telehealth: Payer: Self-pay | Admitting: Family Medicine

## 2015-10-07 NOTE — Telephone Encounter (Signed)
Ok to refill??  Last office visit/ refill 09/03/2015

## 2015-10-07 NOTE — Telephone Encounter (Signed)
Patient calling requesting a refill on his HYDROcodone-acetaminophen ([place]) 10-325 MG   CB# [phone]

## 2015-10-07 NOTE — Telephone Encounter (Signed)
Okay to refill? 

## 2015-10-08 MED ORDER — HYDROCODONE-ACETAMINOPHEN 10-325 MG PO TABS: 1.0000 | ORAL_TABLET | ORAL | 0 refills | Status: DC | PRN

## 2015-10-08 NOTE — Telephone Encounter (Signed)
Prescription printed and patient made aware to come to office to pick up.  

## 2015-10-23 ENCOUNTER — Other Ambulatory Visit: Payer: Self-pay | Admitting: Family Medicine

## 2015-10-30 ENCOUNTER — Telehealth: Payer: Self-pay | Admitting: *Deleted

## 2015-10-30 MED ORDER — HYDROCODONE-ACETAMINOPHEN 10-325 MG PO TABS: 1.0000 | ORAL_TABLET | ORAL | 0 refills | Status: DC | PRN

## 2015-10-30 NOTE — Telephone Encounter (Signed)
okay

## 2015-10-30 NOTE — Telephone Encounter (Signed)
Received call from patient.   Requested refill on Hydrocodone to be picked up next week.   Ok to refill??  Last office visit 09/03/2015.  Last refill 10/08/2015.

## 2015-10-30 NOTE — Telephone Encounter (Signed)
Prescription printed.   Patient can pick up next week.   Call placed to patient and patient made aware.

## 2015-11-11 ENCOUNTER — Encounter: Payer: Self-pay | Admitting: Family Medicine

## 2015-11-11 ENCOUNTER — Ambulatory Visit (INDEPENDENT_AMBULATORY_CARE_PROVIDER_SITE_OTHER): Payer: Medicare Other | Admitting: Family Medicine

## 2015-11-11 VITALS — BP 118/80 | HR 62 | Resp 20 | Wt 174.0 lb

## 2015-11-11 DIAGNOSIS — K089 Disorder of teeth and supporting structures, unspecified: Secondary | ICD-10-CM | POA: Diagnosis not present

## 2015-11-11 DIAGNOSIS — Z23 Encounter for immunization: Secondary | ICD-10-CM

## 2015-11-11 DIAGNOSIS — K047 Periapical abscess without sinus: Secondary | ICD-10-CM | POA: Diagnosis not present

## 2015-11-11 DIAGNOSIS — N644 Mastodynia: Secondary | ICD-10-CM | POA: Diagnosis not present

## 2015-11-11 MED ORDER — AMOXICILLIN 500 MG PO CAPS: 500.0000 mg | ORAL_CAPSULE | Freq: Three times a day (TID) | ORAL | 0 refills | Status: DC

## 2015-11-11 MED ORDER — CHLORHEXIDINE GLUCONATE 0.12 % MT SOLN: 15.0000 mL | Freq: Two times a day (BID) | OROMUCOSAL | 0 refills | Status: DC

## 2015-11-11 NOTE — Patient Instructions (Signed)
We will call Dr. Enrique Sack Take antibiotics Use ibuprofen 600mg  Peridex mouthrinse  Flu shot as needed  F/U as previous

## 2015-11-11 NOTE — Progress Notes (Signed)
   Subjective:    Patient ID: Cory Foley, male    DOB: February 12, 1951, 65 y.o.   MRN: [id number]  Patient presents for Follow-up Patient year with dental pain worsening over the past few weeks. Unfortunately he is uninsured and cannot get to a dentist and have his teeth pulled. They've been breaking off at the gumline. He has swelling in his left upper gumline which is causing pain across feels like it is into his sinuses. He has not noted any drainage around the tooth. Has not had a fever no nausea vomiting.   He also notices a pain beneath his right nipple he has not noticed any significant swelling compared to the left no discharge from the nipple. This started a few weeks ago    Review Of Systems:  GEN- denies fatigue, fever, weight loss,weakness, recent illness HEENT- denies eye drainage, change in vision, nasal discharge, CVS- denies chest pain, palpitations RESP- denies SOB, cough, wheeze ABD- denies N/V, change in stools, abd pain GU- denies dysuria, hematuria, dribbling, incontinence MSK- denies joint pain, muscle aches, injury Neuro- denies headache, dizziness, syncope, seizure activity       Objective:    BP 118/80   Pulse 62   Resp 20   Wt 174 lb (78.9 kg)   SpO2 97%   BMI 26.46 kg/m  GEN- NAD, alert and oriented x3 HEENT- PERRL, EOMI, non injected sclera, pink conjunctiva, MMM, oropharynx clear, poor dentitis- swelling left upper gumline, boggy, rotten teeth bilat, TTP  Neck- Supple, shotty left ant LAD  CVS- RRR, no murmur RESP-CTAB Breast- normal symmetry, no nipple inversion,no nipple drainage, no nodules or lumps felt, TTP at right nipple  Nodes- no axillary nodes Pulses- Radial 2+        Assessment & Plan:      Problem List Items Addressed This Visit    None    Visit Diagnoses    Dental abscess    -  Primary   developing abscess with poor dentition, will see if dr. Rondell Reams office can take him, he has PVD, COPD t, glucose intolerance that  could be worsened with dental infections especially if systemic  amoxicillin and Peridex given   Poor dentition       Breast pain       normal exam except tenderness, likley fatty tissue, will monitor for any chnges, hold on imaging at this time   Encounter for immunization       Flu shot given   Relevant Orders   Flu Vaccine QUAD 36+ mos IM (Completed)      Note: This dictation was prepared with Dragon dictation along with smaller phrase technology. Any transcriptional errors that result from this process are unintentional.

## 2015-11-20 ENCOUNTER — Encounter: Payer: Self-pay | Admitting: Family Medicine

## 2015-11-29 ENCOUNTER — Telehealth: Payer: Self-pay | Admitting: Family Medicine

## 2015-11-29 NOTE — Telephone Encounter (Signed)
-----   Message from Alycia Rossetti, MD sent at 11/26/2015  9:09 AM EDT ----- Regarding: RE: Please check on referral from 2 weeks ago for this pt Please let family know they he did not meet the medical criteria for this  ----- Message ----- From: Olena Mater, LPN Sent: [phone]   8:36 AM To: Alycia Rossetti, MD Subject: RE: Please check on referral from 2 weeks ag#  Dr Rondell Reams office has just called me back. (they are in [facility] and I left them a message)  This pt does not meet their criteria for treatment. ----- Message ----- From: Alycia Rossetti, MD Sent: 11/25/2015   4:47 PM To: Olena Mater, LPN Subject: RE: Please check on referral from 2 weeks ag#  Yes this is a dentist, I wrote in the referrals section why I was having you call, to see if they would take under his medical insurance due to his chronic problems. If they will not accept then you can give the direct number of [place] office to the patient.  He often does medical extractions for cancer patients Etc  ----- Message ----- From: Olena Mater, LPN Sent: [phone]   2:28 PM To: Alycia Rossetti, MD Subject: RE: Please check on referral from 2 weeks ag#  Is this for a Dentist??  We do not do Dental referrals.  Pt needs to call his insurance and ask if they have a dental plan and who participates.  If not he just needs to find a dentist.  We have no idea who any dental providers are because we don't do dental referrals. ----- Message ----- From: Alycia Rossetti, MD Sent: 11/25/2015  10:34 AM To: Olena Mater, LPN Subject: Please check on referral from 2 weeks ago fo#

## 2015-11-29 NOTE — Telephone Encounter (Signed)
Pt called and made aware of referral situation.  Told to call around to local dental providers and find someone who can work with him.  He mentioned [facility] on [address].  He has heard good things.

## 2015-12-02 ENCOUNTER — Telehealth: Payer: Self-pay | Admitting: Family Medicine

## 2015-12-02 ENCOUNTER — Telehealth: Payer: Self-pay

## 2015-12-02 MED ORDER — CLINDAMYCIN HCL 300 MG PO CAPS: 300.0000 mg | ORAL_CAPSULE | Freq: Three times a day (TID) | ORAL | 0 refills | Status: DC

## 2015-12-02 NOTE — Telephone Encounter (Signed)
Rx refilled per protocol 

## 2015-12-02 NOTE — Telephone Encounter (Signed)
Patient's wife calling asking if patient can get another round of antibiotics for his teeth. I advised wife that patient might need to come in. I advised her I would put in the request. Patient uses [facility]  Patient's CB# [phone]

## 2015-12-02 NOTE — Telephone Encounter (Signed)
Send in Clindamycin 300mg  TID for 7 days Dr. Enrique Sack office would not accept him as a medical patient, He needs to call around to if dentist can pull the tooth, he can't stay on long antibiotics for this

## 2015-12-05 ENCOUNTER — Telehealth: Payer: Self-pay | Admitting: Family Medicine

## 2015-12-05 NOTE — Telephone Encounter (Signed)
Ok to refill??  Last office visit 11/11/2015.  Last refill 10/30/2015.

## 2015-12-05 NOTE — Telephone Encounter (Signed)
Patient called requesting a refill on his hydrocodone 10-325mg    CB# [phone]

## 2015-12-06 MED ORDER — HYDROCODONE-ACETAMINOPHEN 10-325 MG PO TABS: 1.0000 | ORAL_TABLET | ORAL | 0 refills | Status: DC | PRN

## 2015-12-06 NOTE — Telephone Encounter (Signed)
okay

## 2015-12-06 NOTE — Telephone Encounter (Signed)
Prescription printed and patient made aware to come to office to pick up after 2 pm on 12/06/2015 per VM.

## 2015-12-24 ENCOUNTER — Other Ambulatory Visit: Payer: Self-pay | Admitting: Family Medicine

## 2015-12-29 ENCOUNTER — Other Ambulatory Visit: Payer: Self-pay | Admitting: Family Medicine

## 2015-12-30 NOTE — Telephone Encounter (Signed)
Medication refilled per protocol. 

## 2016-01-07 ENCOUNTER — Telehealth: Payer: Self-pay | Admitting: Family Medicine

## 2016-01-07 ENCOUNTER — Ambulatory Visit: Payer: Medicare Other | Admitting: Family Medicine

## 2016-01-07 MED ORDER — HYDROCODONE-ACETAMINOPHEN 10-325 MG PO TABS: 1.0000 | ORAL_TABLET | ORAL | 0 refills | Status: DC | PRN

## 2016-01-07 NOTE — Telephone Encounter (Signed)
Ok to refill??  Last office visit 11/11/2015.  Last refill 12/06/2015.

## 2016-01-07 NOTE — Telephone Encounter (Signed)
okay

## 2016-01-07 NOTE — Telephone Encounter (Signed)
Patient would like rx for hydrocodone  [phone]

## 2016-01-07 NOTE — Telephone Encounter (Signed)
Prescription printed and patient made aware to come to office to pick up.  

## 2016-01-25 ENCOUNTER — Other Ambulatory Visit: Payer: Self-pay | Admitting: Family Medicine

## 2016-02-07 ENCOUNTER — Telehealth: Payer: Self-pay | Admitting: *Deleted

## 2016-02-07 MED ORDER — HYDROCODONE-ACETAMINOPHEN 10-325 MG PO TABS: 1.0000 | ORAL_TABLET | ORAL | 0 refills | Status: DC | PRN

## 2016-02-07 NOTE — Telephone Encounter (Signed)
Received call from patient.   Requested refill on Hydrocodone.   Ok to refill??  Last office visit 11/01/2015.  Last refill 01/07/2016.

## 2016-02-07 NOTE — Telephone Encounter (Signed)
OKAY...

## 2016-02-07 NOTE — Telephone Encounter (Signed)
Prescription printed and patient made aware to come to office to pick up after 2pm on 02/07/2016.

## 2016-02-22 ENCOUNTER — Other Ambulatory Visit: Payer: Self-pay | Admitting: Family Medicine

## 2016-02-28 ENCOUNTER — Other Ambulatory Visit: Payer: Self-pay | Admitting: Family Medicine

## 2016-03-13 ENCOUNTER — Encounter: Payer: Self-pay | Admitting: Family Medicine

## 2016-03-13 ENCOUNTER — Ambulatory Visit (INDEPENDENT_AMBULATORY_CARE_PROVIDER_SITE_OTHER): Payer: Medicare Other | Admitting: Family Medicine

## 2016-03-13 VITALS — BP 132/70 | HR 74 | Temp 98.3°F | Resp 12 | Ht 68.0 in | Wt 181.0 lb

## 2016-03-13 DIAGNOSIS — Z8249 Family history of ischemic heart disease and other diseases of the circulatory system: Secondary | ICD-10-CM | POA: Diagnosis not present

## 2016-03-13 DIAGNOSIS — R079 Chest pain, unspecified: Secondary | ICD-10-CM | POA: Diagnosis not present

## 2016-03-13 DIAGNOSIS — R7302 Impaired glucose tolerance (oral): Secondary | ICD-10-CM

## 2016-03-13 DIAGNOSIS — E7800 Pure hypercholesterolemia, unspecified: Secondary | ICD-10-CM | POA: Diagnosis not present

## 2016-03-13 DIAGNOSIS — M5136 Other intervertebral disc degeneration, lumbar region: Secondary | ICD-10-CM

## 2016-03-13 DIAGNOSIS — G2581 Restless legs syndrome: Secondary | ICD-10-CM | POA: Diagnosis not present

## 2016-03-13 DIAGNOSIS — M51369 Other intervertebral disc degeneration, lumbar region without mention of lumbar back pain or lower extremity pain: Secondary | ICD-10-CM

## 2016-03-13 LAB — COMPREHENSIVE METABOLIC PANEL
ALT: 17 U/L (ref 9–46)
AST: 21 U/L (ref 10–35)
Albumin: 3.9 g/dL (ref 3.6–5.1)
Alkaline Phosphatase: 60 U/L (ref 40–115)
BUN: 9 mg/dL (ref 7–25)
CO2: 24 mmol/L (ref 20–31)
Calcium: 9.4 mg/dL (ref 8.6–10.3)
Chloride: 109 mmol/L (ref 98–110)
Creat: 0.83 mg/dL (ref 0.70–1.25)
Glucose, Bld: 80 mg/dL (ref 70–99)
Potassium: 4.4 mmol/L (ref 3.5–5.3)
Sodium: 141 mmol/L (ref 135–146)
Total Bilirubin: 0.4 mg/dL (ref 0.2–1.2)
Total Protein: 6.5 g/dL (ref 6.1–8.1)

## 2016-03-13 LAB — CBC WITH DIFFERENTIAL/PLATELET
Basophils Absolute: 0 cells/uL (ref 0–200)
Basophils Relative: 0 %
Eosinophils Absolute: 120 cells/uL (ref 15–500)
Eosinophils Relative: 2 %
HCT: 40.1 % (ref 38.5–50.0)
Hemoglobin: 13 g/dL (ref 13.0–17.0)
Lymphocytes Relative: 23 %
Lymphs Abs: 1380 cells/uL (ref 850–3900)
MCH: 30.7 pg (ref 27.0–33.0)
MCHC: 32.4 g/dL (ref 32.0–36.0)
MCV: 94.8 fL (ref 80.0–100.0)
MPV: 10.5 fL (ref 7.5–12.5)
Monocytes Absolute: 420 cells/uL (ref 200–950)
Monocytes Relative: 7 %
Neutro Abs: 4080 cells/uL (ref 1500–7800)
Neutrophils Relative %: 68 %
Platelets: 207 10*3/uL (ref 140–400)
RBC: 4.23 MIL/uL (ref 4.20–5.80)
RDW: 14.7 % (ref 11.0–15.0)
WBC: 6 10*3/uL (ref 3.8–10.8)

## 2016-03-13 LAB — HEMOGLOBIN A1C
Hgb A1c MFr Bld: 5.5 % (ref <5.7)
Mean Plasma Glucose: 111 mg/dL

## 2016-03-13 LAB — LIPID PANEL
Cholesterol: 177 mg/dL (ref <200)
HDL: 46 mg/dL (ref >40)
LDL Cholesterol: 103 mg/dL — ABNORMAL HIGH (ref <100)
Total CHOL/HDL Ratio: 3.8 Ratio (ref <5.0)
Triglycerides: 142 mg/dL (ref <150)
VLDL: 28 mg/dL (ref <30)

## 2016-03-13 MED ORDER — ROPINIROLE HCL 2 MG PO TABS: 2.0000 mg | ORAL_TABLET | Freq: Every day | ORAL | 6 refills | Status: DC

## 2016-03-13 MED ORDER — HYDROCODONE-ACETAMINOPHEN 10-325 MG PO TABS: 1.0000 | ORAL_TABLET | ORAL | 0 refills | Status: DC | PRN

## 2016-03-13 NOTE — Progress Notes (Signed)
Subjective:    Patient ID: Cory Foley, male    DOB: 10/05/1950, 66 y.o.   MRN: [id number]  Patient presents for 4 month F/U (is fasting) Patient here to follow-up chronic medical problems and here for fasting labs. Chronic pain chronic back pain chronic sacral pain from his prostate cancer he is still on hydrocodone requests a refill on this.  Hyperlipidemia currently taking simvastatin last lipid panel 6 months ago LDL was at goal at 94  Medications reviewed  Decreased appetite/weight loss  he is currently on Remeron his weight is now at 181 pounds, appetite is much better now he is actually eating a lot of sweets but also soft these because of his dental problems airplane and to remove some of his T-cell that he can have partials placed History of glucose intolerance due for repeat A1c  RLS- worsening symptoms on reqiup at beditme , flexor jumping around typically around 6:30 PM and last through the night he is fine during the daytime  At the end of the visit he states he he had an episode of severe chest pain yesterday which aren't to shovel snow he felt a crushing in his chest and some numbness in both arms he went into the house assisted down to a nitroglycerin waited about 30 minutes and took another he received these from his father. His chest pain finally eased up. His blood pressure did go up to 186 on the top number. He has not had any other episodes of chest pain and nausea vomiting or diaphoresis Does have family history of heart disease   Review Of Systems:  GEN- denies fatigue, fever, weight loss,weakness, recent illness HEENT- denies eye drainage, change in vision, nasal discharge, CVS- +chest pain, denies palpitations RESP- denies SOB, cough, wheeze ABD- denies N/V, change in stools, abd pain GU- denies dysuria, hematuria, dribbling, incontinence MSK-+joint pain, muscle aches, injury Neuro- denies headache, dizziness, syncope, seizure activity       Objective:     BP 132/70 (BP Location: Left Arm, Patient Position: Sitting, Cuff Size: Large)   Pulse 74   Temp 98.3 F (36.8 C) (Oral)   Resp 12   Ht 5\' 8"  (1.727 m)   Wt 181 lb (82.1 kg)   SpO2 98%   BMI 27.52 kg/m  GEN- NAD, alert and oriented x3 HEENT- PERRL, EOMI, non injected sclera, pink conjunctiva, MMM, oropharynx clear Neck- Supple,  CVS- RRR, no murmur RESP-CTAB ABD-NABS,soft,NT,ND EXT- No edema Pulses- Radial 2+  EKG- NSR, no ST changes, neg t waves       Assessment & Plan:      Problem List Items Addressed This Visit    Hyperlipidemia    Check lipids, risk factor for CAD and A1C Weight is now up, to avoid keeping him being obese, then D/C remeron      Relevant Orders   CBC with Differential/Platelet   Comprehensive metabolic panel   Lipid panel   Glucose intolerance (impaired glucose tolerance) - Primary   Relevant Orders   Hemoglobin A1c   DDD (degenerative disc disease), lumbar    Pain medication refilled       Relevant Medications   HYDROcodone-acetaminophen (NORCO) 10-325 MG tablet    Other Visit Diagnoses    Chest pain, unspecified type       Family history of heart disease, pain with exertion relieved with Nitro EKG shows some flipped t waves but non specific, based on history send to cardiology for stress testing He has  risk factors himself If pain occurs again he is to go to ER for evaluation   Relevant Orders   EKG 12-Lead (Completed)      Note: This dictation was prepared with Dragon dictation along with smaller phrase technology. Any transcriptional errors that result from this process are unintentional.

## 2016-03-13 NOTE — Assessment & Plan Note (Signed)
Pain medication refilled

## 2016-03-13 NOTE — Patient Instructions (Addendum)
F/U 4 months for Physical Stop the remeron We will call with lab results  Referral to cardiology

## 2016-03-13 NOTE — Assessment & Plan Note (Signed)
Check lipids, risk factor for CAD and A1C Weight is now up, to avoid keeping him being obese, then D/C remeron

## 2016-03-13 NOTE — Assessment & Plan Note (Signed)
Increase requip to 2MG  at bedtime May need nerve conduction or sleep study if not improving

## 2016-03-13 NOTE — Addendum Note (Signed)
Addended by: Vic Blackbird F on: 03/13/2016 02:36 PM   Modules accepted: Orders

## 2016-03-17 ENCOUNTER — Encounter: Payer: Self-pay | Admitting: *Deleted

## 2016-03-23 ENCOUNTER — Other Ambulatory Visit: Payer: Self-pay | Admitting: Family Medicine

## 2016-03-24 ENCOUNTER — Encounter: Payer: Self-pay | Admitting: Cardiology

## 2016-03-24 NOTE — Progress Notes (Signed)
Cardiology Office Note  Date: 03/25/2016   ID: Cory Foley, DOB 25-Dec-1950, MRN [id number]  PCP: Vic Blackbird, MD  Consulting Cardiologist: Rozann Lesches, MD   Chief Complaint  Patient presents with  . Chest Pain    History of Present Illness: Cory Foley is a 66 y.o. male referred for cardiology consultation by Dr. Buelah Manis. He presents for the evaluation of chest discomfort. History includes peripheral arterial disease status post left lower extremity revascularization surgery, active tobacco abuse, hyperlipidemia, and premature CAD in his father. He states that during the recent snow in January he was outside shoveling his driveway when he began to experience a tightness and pressure in his chest followed by tingling in both arms. He stopped what he was doing and went inside to rest, took one of his father's nitroglycerin tablets which ultimately helped. He states that this is the only time he has felt symptoms like this, they were moderate in intensity and also associated with shortness of breath. He does describe dyspnea on exertion which is been more chronic problem. He reports undergoing stress testing prior to vascular surgery back in 2012.  He also tells that he has fairly aggressive prostate cancer that has failed radiation treatments. He continues to follow with oncology.  He has cut back smoking cigarettes but has not been able to quit despite several attempts and different medications.  I reviewed his medications which are outlined below. He is on both aspirin and statin therapy. Recent LDL was 108.   Past Medical History:  Diagnosis Date  . Allergy   . Anxiety   . Arthritis   . BPH with obstruction/lower urinary tract symptoms   . COPD, mild ([place])   . ED (erectile dysfunction)   . GERD (gastroesophageal reflux disease)   . Hyperlipidemia   . Nocturia   . Osteoporosis   . 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 PVD (peripheral vascular disease) ([place])     Past Surgical History:  Procedure Laterality Date  . COLONOSCOPY N/A 09/28/2012   Procedure: COLONOSCOPY;  Surgeon: Danie Binder, MD;  Location: [facility];  Service: Endoscopy;  Laterality: N/A;  8:30  . ESOPHAGOGASTRODUODENOSCOPY N/A 09/28/2012   Procedure: ESOPHAGOGASTRODUODENOSCOPY (EGD);  Surgeon: Danie Binder, MD;  Location: [facility];  Service: Endoscopy;  Laterality: N/A;  . Gold seed implatation  04/28/2012  . HERNIA REPAIR     Bilateral inguinal X2  . JOINT REPLACEMENT     bilateral  . KNEE SURGERY     Left Knee X 2   and Right knee X1  . MALONEY DILATION N/A 09/28/2012   Procedure: MALONEY DILATION;  Surgeon: Danie Binder, MD;  Location: [facility];  Service: Endoscopy;  Laterality: N/A;  . NOSE SURGERY    . PR VEIN BYPASS GRAFT,AORTO-FEM-POP  10/21/10   Left AK to BK popliteal BPG  . PROSTATE BIOPSY  01/11/2012   Adenocarcinoma  . PROSTATE SURGERY  2015   Chemo and  Radiation  . SAVORY DILATION N/A 09/28/2012   Procedure: SAVORY DILATION;  Surgeon: Danie Binder, MD;  Location: [facility];  Service: Endoscopy;  Laterality: N/A;    Current Outpatient Prescriptions  Medication Sig Dispense Refill  . albuterol (PROVENTIL HFA;VENTOLIN HFA) 108 (90 BASE) MCG/ACT inhaler Inhale 2 puffs into the lungs every 6 (six) hours as needed for wheezing. 1 Inhaler 0  . alendronate (FOSAMAX) 70 MG tablet TAKE 1  TAB EACH WEEK 30 MIN PRIOR TO BREAKFAST WITH LARGE GLASS OF WATER. REMAIN UPRIGHT. 4 tablet 5  . aspirin 81 MG tablet Take 81 mg by mouth daily.      . calcium-vitamin D 250-100 MG-UNIT tablet Take 1 tablet by mouth 2 (two) times daily.    . chlorhexidine (PERIDEX) 0.12 % solution Use as directed 15 mLs in the mouth or throat 2 (two) times daily. 120 mL 0  . HYDROcodone-acetaminophen (NORCO) 10-325 MG tablet Take 1 tablet by mouth every 4 (four) hours as needed. 120 tablet 0  . pantoprazole (PROTONIX) 40  MG tablet TAKE ONE TABLET BY MOUTH DAILY. 90 tablet 1  . rOPINIRole (REQUIP) 2 MG tablet Take 1 tablet (2 mg total) by mouth at bedtime. 30 tablet 6  . simvastatin (ZOCOR) 40 MG tablet TAKE (1) TABLET BY MOUTH AT BEDTIME. 30 tablet 0   No current facility-administered medications for this visit.    Allergies:  Codeine; Morphine and related; and Percocet [oxycodone-acetaminophen]   Social History: The patient  reports that he has been smoking Cigarettes.  He has a 21.50 pack-year smoking history. He has never used smokeless tobacco. He reports that he does not drink alcohol or use drugs.   Family History: The patient's family history includes Cancer (age of onset: 32) in his sister; Diabetes in his mother; Heart attack in his father; Heart disease in his father, mother, and sister; Hyperlipidemia in his father and mother; Hypertension in his brother and father.   ROS:  Please see the history of present illness. Otherwise, complete review of systems is positive for dyspnea on exertion, no progressive claudication symptoms.  All other systems are reviewed and negative.  Physical Exam: VS:  BP 122/78 (BP Location: Right Arm)   Pulse 74   Ht 5' 7.5" (1.715 m)   Wt 177 lb (80.3 kg)   SpO2 98%   BMI 27.31 kg/m , BMI Body mass index is 27.31 kg/m.  Wt Readings from Last 3 Encounters:  03/25/16 177 lb (80.3 kg)  03/13/16 181 lb (82.1 kg)  11/11/15 174 lb (78.9 kg)    General: Patient appears comfortable at rest. HEENT: Conjunctiva and lids normal, oropharynx clear. Neck: Supple, no elevated JVP or carotid bruits, no thyromegaly. Lungs: Decreased breath sounds without wheezing, nonlabored breathing at rest. Cardiac: Regular rate and rhythm, no S3, soft systolic murmur, no pericardial rub. Abdomen: Soft, nontender, bowel sounds present, no guarding or rebound. Extremities: No pitting edema, distal pulses 1+.. Skin: Warm and dry. Musculoskeletal: No kyphosis. Neuropsychiatric: Alert and  oriented x3, affect grossly appropriate.  ECG: I personally reviewed the tracing from 03/13/2016 which showed sinus rhythm with prolonged PR interval and nonspecific T-wave changes.  Recent Labwork: 04/29/2015: TSH 1.97 03/13/2016: ALT 17; AST 21; BUN 9; Creat 0.83; Hemoglobin 13.0; Platelets 207; Potassium 4.4; Sodium 141     Component Value Date/Time   CHOL 177 03/13/2016 1137   TRIG 142 03/13/2016 1137   HDL 46 03/13/2016 1137   CHOLHDL 3.8 03/13/2016 1137   VLDL 28 03/13/2016 1137   LDLCALC 103 (H) 03/13/2016 1137    Other Studies Reviewed Today:  Abdominal and pelvic CT 01/14/2015: IMPRESSION: 1. No evidence of metastatic disease in the abdomen or pelvis. 2. No acute findings in the abdomen or pelvis to account for the patient's symptoms. 3. Atherosclerosis. 4. Normal appendix. 5. 2.3 x 1.9 cm right adrenal nodule is unchanged, presumably an adenoma.  Assessment and Plan:  1. Chest pain consistent with  angina as outlined above. This is in the setting of cardiac risk factors including documented PAD, ongoing tobacco use, hyperlipidemia, and family history of premature CAD in his father. We have discussed options for further evaluation including stress testing and cardiac catheterization. At this point he is comfortable pursuing a stress test for further risk stratification, we will arrange a [employer] as he states that he would not be able to navigate a treadmill. Depending on these results we can determine the next step.  2. Ongoing tobacco use, long-standing history. He has tried to quit several times but has not been successful. We discussed smoking cessation importance today.  3. Hyperlipidemia on Zocor. Recent LDL 103. Would aim for more aggressive control and try to get his LDL 70 or less.  4. Peripheral arterial disease status post left lower extremity revascularization surgery back in 2012. He is not reporting any worsening claudication at this time.  Current  medicines were reviewed with the patient today.   Orders Placed This Encounter  Procedures  . NM Myocar Multi W/Spect W/Wall Motion / EF    Disposition: Call with test results and determine follow-up.  Signed, Satira Sark, MD, [facility] 03/25/2016 8:40 AM    [place] [facility] [address], [place], [place] [address] Phone: [phone]; Fax: [phone]

## 2016-03-25 ENCOUNTER — Encounter: Payer: Self-pay | Admitting: Cardiology

## 2016-03-25 ENCOUNTER — Ambulatory Visit (INDEPENDENT_AMBULATORY_CARE_PROVIDER_SITE_OTHER): Payer: Medicare Other | Admitting: Cardiology

## 2016-03-25 VITALS — BP 122/78 | HR 74 | Ht 67.5 in | Wt 177.0 lb

## 2016-03-25 DIAGNOSIS — E782 Mixed hyperlipidemia: Secondary | ICD-10-CM | POA: Diagnosis not present

## 2016-03-25 DIAGNOSIS — R072 Precordial pain: Secondary | ICD-10-CM | POA: Diagnosis not present

## 2016-03-25 DIAGNOSIS — Z720 Tobacco use: Secondary | ICD-10-CM | POA: Diagnosis not present

## 2016-03-25 DIAGNOSIS — I208 Other forms of angina pectoris: Secondary | ICD-10-CM

## 2016-03-25 DIAGNOSIS — I739 Peripheral vascular disease, unspecified: Secondary | ICD-10-CM

## 2016-03-25 NOTE — Patient Instructions (Signed)
Medication Instructions:  Your physician recommends that you continue on your current medications as directed. Please refer to the Current Medication list given to you today.   Labwork: NONE  Testing/Procedures: Your physician has requested that you have a lexiscan myoview. For further information please visit www.[email or web address]. Please follow instruction sheet, as given.    Follow-Up: Your physician recommends that you schedule a follow-up appointment in: TO BE DETERMINED    Any Other Special Instructions Will Be Listed Below (If Applicable).     If you need a refill on your cardiac medications before your next appointment, please call your pharmacy.   

## 2016-04-01 ENCOUNTER — Encounter (HOSPITAL_COMMUNITY): Payer: Self-pay

## 2016-04-01 ENCOUNTER — Inpatient Hospital Stay (HOSPITAL_COMMUNITY): Admission: RE | Admit: 2016-04-01 | Payer: Medicare Other | Source: Ambulatory Visit

## 2016-04-01 ENCOUNTER — Encounter (HOSPITAL_COMMUNITY)
Admission: RE | Admit: 2016-04-01 | Discharge: 2016-04-01 | Disposition: A | Discharge status: Home / Self Care | Payer: Medicare Other | Source: Ambulatory Visit | Attending: Cardiology | Admitting: Cardiology

## 2016-04-01 DIAGNOSIS — I208 Other forms of angina pectoris: Secondary | ICD-10-CM

## 2016-04-01 LAB — NM MYOCAR MULTI W/SPECT W/WALL MOTION / EF
LV dias vol: 75 mL (ref 62–150)
LV sys vol: 28 mL
Peak HR: 93 {beats}/min
RATE: 0.31
Rest HR: 56 {beats}/min
SDS: 2
SRS: 0
SSS: 2
TID: 1.06

## 2016-04-01 MED ORDER — TECHNETIUM TC 99M TETROFOSMIN IV KIT
10.0000 | PACK | Freq: Once | INTRAVENOUS | Status: AC | PRN
  Administered 2016-04-01: 11 via INTRAVENOUS

## 2016-04-01 MED ORDER — TECHNETIUM TC 99M TETROFOSMIN IV KIT
30.0000 | PACK | Freq: Once | INTRAVENOUS | Status: AC | PRN
  Administered 2016-04-01: 31 via INTRAVENOUS

## 2016-04-01 MED ORDER — SODIUM CHLORIDE 0.9% FLUSH
INTRAVENOUS | Status: AC
  Administered 2016-04-01: 10 mL via INTRAVENOUS
  Filled 2016-04-01: qty 10

## 2016-04-01 MED ORDER — REGADENOSON 0.4 MG/5ML IV SOLN
INTRAVENOUS | Status: AC
  Administered 2016-04-01: 0.4 mg via INTRAVENOUS
  Filled 2016-04-01: qty 5

## 2016-04-13 ENCOUNTER — Telehealth: Payer: Self-pay | Admitting: *Deleted

## 2016-04-13 MED ORDER — HYDROCODONE-ACETAMINOPHEN 10-325 MG PO TABS: 1.0000 | ORAL_TABLET | ORAL | 0 refills | Status: DC | PRN

## 2016-04-13 NOTE — Telephone Encounter (Signed)
Prescription printed and patient made aware to come to office to pick up after 2pm on 04/13/2016. 

## 2016-04-13 NOTE — Telephone Encounter (Signed)
okay

## 2016-04-13 NOTE — Telephone Encounter (Signed)
Received call from patient.   Requested refill on Hydrocodone.   Ok to refill??  Last office visit/ refill 03/13/2016.

## 2016-04-23 ENCOUNTER — Other Ambulatory Visit: Payer: Self-pay | Admitting: Family Medicine

## 2016-05-08 ENCOUNTER — Telehealth: Payer: Self-pay | Admitting: *Deleted

## 2016-05-08 MED ORDER — HYDROCODONE-ACETAMINOPHEN 10-325 MG PO TABS: 1.0000 | ORAL_TABLET | ORAL | 0 refills | Status: DC | PRN

## 2016-05-08 NOTE — Telephone Encounter (Signed)
Prescription printed and patient made aware to come to office to pick up.   Patient will pick up on Monday.

## 2016-05-08 NOTE — Telephone Encounter (Signed)
Received call from patient.   Requested refill on Hydrocodone.   Ok to refill??  Last office visit 03/13/2016.  Last refill 04/13/2016.

## 2016-05-08 NOTE — Telephone Encounter (Signed)
okay

## 2016-05-21 ENCOUNTER — Encounter (HOSPITAL_COMMUNITY)
Admission: RE | Admit: 2016-05-21 | Discharge: 2016-05-21 | Disposition: A | Discharge status: Home / Self Care | Payer: Medicare Other | Source: Ambulatory Visit | Attending: Family Medicine | Admitting: Family Medicine

## 2016-05-21 DIAGNOSIS — M810 Age-related osteoporosis without current pathological fracture: Secondary | ICD-10-CM | POA: Insufficient documentation

## 2016-05-21 LAB — COMPREHENSIVE METABOLIC PANEL
ALT: 20 U/L (ref 17–63)
AST: 25 U/L (ref 15–41)
Albumin: 3.9 g/dL (ref 3.5–5.0)
Alkaline Phosphatase: 56 U/L (ref 38–126)
Anion gap: 6 (ref 5–15)
BUN: 11 mg/dL (ref 6–20)
CO2: 26 mmol/L (ref 22–32)
Calcium: 9.2 mg/dL (ref 8.9–10.3)
Chloride: 105 mmol/L (ref 101–111)
Creatinine, Ser: 0.81 mg/dL (ref 0.61–1.24)
GFR calc Af Amer: >60 mL/min (ref >60)
GFR calc non Af Amer: >60 mL/min (ref >60)
Glucose, Bld: 99 mg/dL (ref 65–99)
Potassium: 3.9 mmol/L (ref 3.5–5.1)
Sodium: 137 mmol/L (ref 135–145)
Total Bilirubin: 0.7 mg/dL (ref 0.3–1.2)
Total Protein: 6.4 g/dL — ABNORMAL LOW (ref 6.5–8.1)

## 2016-05-21 MED ORDER — ZOLEDRONIC ACID 5 MG/100ML IV SOLN
5.0000 mg | Freq: Once | INTRAVENOUS | Status: AC
Start: 2016-05-21 — End: 2016-05-21
  Administered 2016-05-21: 5 mg via INTRAVENOUS
  Filled 2016-05-21: qty 100

## 2016-05-21 MED ORDER — SODIUM CHLORIDE 0.9 % IV SOLN
INTRAVENOUS | Status: DC
  Administered 2016-05-21: 10:00:00 via INTRAVENOUS

## 2016-05-21 NOTE — Progress Notes (Signed)
Results for BENJY, KANA (MRN [id number]) as of 05/21/2016 09:55  Ref. Range 05/21/2016 09:05  COMPREHENSIVE METABOLIC PANEL Unknown Rpt (A)  Sodium Latest Ref Range: 135 - 145 mmol/L 137  Potassium Latest Ref Range: 3.5 - 5.1 mmol/L 3.9  Chloride Latest Ref Range: 101 - 111 mmol/L 105  CO2 Latest Ref Range: 22 - 32 mmol/L 26  Glucose Latest Ref Range: 65 - 99 mg/dL 99  BUN Latest Ref Range: 6 - 20 mg/dL 11  Creatinine Latest Ref Range: 0.61 - 1.24 mg/dL 0.81  Calcium Latest Ref Range: 8.9 - 10.3 mg/dL 9.2  Anion gap Latest Ref Range: 5 - 15  6  Alkaline Phosphatase Latest Ref Range: 38 - 126 U/L 56  Albumin Latest Ref Range: 3.5 - 5.0 g/dL 3.9  AST Latest Ref Range: 15 - 41 U/L 25  ALT Latest Ref Range: 17 - 63 U/L 20  Total Protein Latest Ref Range: 6.5 - 8.1 g/dL 6.4 (L)  Total Bilirubin Latest Ref Range: 0.3 - 1.2 mg/dL 0.7  EGFR (African American) Latest Ref Range: >60 mL/min >60  EGFR (Non-African Amer.) Latest Ref Range: >60 mL/min >60

## 2016-05-23 ENCOUNTER — Other Ambulatory Visit: Payer: Self-pay | Admitting: Family Medicine

## 2016-06-01 ENCOUNTER — Other Ambulatory Visit: Payer: Self-pay | Admitting: Family Medicine

## 2016-06-10 ENCOUNTER — Telehealth: Payer: Self-pay | Admitting: *Deleted

## 2016-06-10 MED ORDER — HYDROCODONE-ACETAMINOPHEN 10-325 MG PO TABS
1.0000 | ORAL_TABLET | ORAL | 0 refills | Status: DC | PRN
Start: 2016-06-10 — End: 2016-07-08

## 2016-06-10 NOTE — Telephone Encounter (Signed)
Okay 

## 2016-06-10 NOTE — Telephone Encounter (Signed)
Prescription printed and patient made aware to come to office to pick up on 06/11/2016.

## 2016-06-10 NOTE — Telephone Encounter (Signed)
Received call from patient.   Requested refill on Hydrocodone.   Ok to refill??  Last office visit 03/13/2016.  Last refill 05/08/2016.

## 2016-06-18 ENCOUNTER — Telehealth: Payer: Self-pay | Admitting: *Deleted

## 2016-06-18 NOTE — Telephone Encounter (Signed)
Received call from patient wife, Holley Raring.   States that he is scheduled to have all teeth removed before being fitted for dentures.   Requested recommendation on holding ASA before procedure.  Advised to hold ASA x5 days prior.

## 2016-06-22 ENCOUNTER — Other Ambulatory Visit: Payer: Self-pay | Admitting: Family Medicine

## 2016-07-07 ENCOUNTER — Telehealth: Payer: Self-pay | Admitting: *Deleted

## 2016-07-07 NOTE — Telephone Encounter (Signed)
Okay to refill? 

## 2016-07-07 NOTE — Telephone Encounter (Signed)
Received call from patient.   Requested refill on Hydrocodone.   Ok to refill??  Last office visit 03/13/2016.  Last refill 06/10/2016.

## 2016-07-08 MED ORDER — HYDROCODONE-ACETAMINOPHEN 10-325 MG PO TABS
1.0000 | ORAL_TABLET | ORAL | 0 refills | Status: DC | PRN
Start: 2016-07-08 — End: 2016-07-14

## 2016-07-08 NOTE — Telephone Encounter (Signed)
Prescription printed.   Patient is coming in this afternoon with father. Will pick up at that time.

## 2016-07-14 ENCOUNTER — Encounter: Payer: Self-pay | Admitting: Family Medicine

## 2016-07-14 ENCOUNTER — Ambulatory Visit (INDEPENDENT_AMBULATORY_CARE_PROVIDER_SITE_OTHER): Payer: Medicare Other | Admitting: Family Medicine

## 2016-07-14 VITALS — BP 128/72 | HR 56 | Temp 98.5°F | Resp 16 | Ht 67.5 in | Wt 167.0 lb

## 2016-07-14 DIAGNOSIS — C61 Malignant neoplasm of prostate: Secondary | ICD-10-CM | POA: Diagnosis not present

## 2016-07-14 DIAGNOSIS — Z23 Encounter for immunization: Secondary | ICD-10-CM | POA: Diagnosis not present

## 2016-07-14 DIAGNOSIS — E7800 Pure hypercholesterolemia, unspecified: Secondary | ICD-10-CM | POA: Diagnosis not present

## 2016-07-14 DIAGNOSIS — Z1159 Encounter for screening for other viral diseases: Secondary | ICD-10-CM

## 2016-07-14 DIAGNOSIS — J42 Unspecified chronic bronchitis: Secondary | ICD-10-CM | POA: Diagnosis not present

## 2016-07-14 DIAGNOSIS — M810 Age-related osteoporosis without current pathological fracture: Secondary | ICD-10-CM | POA: Diagnosis not present

## 2016-07-14 DIAGNOSIS — Z0000 Encounter for general adult medical examination without abnormal findings: Secondary | ICD-10-CM

## 2016-07-14 DIAGNOSIS — F17200 Nicotine dependence, unspecified, uncomplicated: Secondary | ICD-10-CM

## 2016-07-14 DIAGNOSIS — R634 Abnormal weight loss: Secondary | ICD-10-CM

## 2016-07-14 LAB — COMPREHENSIVE METABOLIC PANEL
ALT: 10 U/L (ref 9–46)
AST: 16 U/L (ref 10–35)
Albumin: 4.2 g/dL (ref 3.6–5.1)
Alkaline Phosphatase: 54 U/L (ref 40–115)
BUN: 9 mg/dL (ref 7–25)
CO2: 24 mmol/L (ref 20–31)
Calcium: 9.4 mg/dL (ref 8.6–10.3)
Chloride: 107 mmol/L (ref 98–110)
Creat: 0.82 mg/dL (ref 0.70–1.25)
Glucose, Bld: 96 mg/dL (ref 70–99)
Potassium: 5 mmol/L (ref 3.5–5.3)
Sodium: 142 mmol/L (ref 135–146)
Total Bilirubin: 0.4 mg/dL (ref 0.2–1.2)
Total Protein: 6.3 g/dL (ref 6.1–8.1)

## 2016-07-14 LAB — LIPID PANEL
Cholesterol: 129 mg/dL (ref <200)
HDL: 49 mg/dL (ref >40)
LDL Cholesterol: 67 mg/dL (ref <100)
Total CHOL/HDL Ratio: 2.6 Ratio (ref <5.0)
Triglycerides: 67 mg/dL (ref <150)
VLDL: 13 mg/dL (ref <30)

## 2016-07-14 LAB — CBC WITH DIFFERENTIAL/PLATELET
Basophils Absolute: 0 cells/uL (ref 0–200)
Basophils Relative: 0 %
Eosinophils Absolute: 156 cells/uL (ref 15–500)
Eosinophils Relative: 3 %
HCT: 40.7 % (ref 38.5–50.0)
Hemoglobin: 13.2 g/dL (ref 13.0–17.0)
Lymphocytes Relative: 29 %
Lymphs Abs: 1508 cells/uL (ref 850–3900)
MCH: 31.1 pg (ref 27.0–33.0)
MCHC: 32.4 g/dL (ref 32.0–36.0)
MCV: 95.8 fL (ref 80.0–100.0)
MPV: 10.4 fL (ref 7.5–12.5)
Monocytes Absolute: 416 cells/uL (ref 200–950)
Monocytes Relative: 8 %
Neutro Abs: 3120 cells/uL (ref 1500–7800)
Neutrophils Relative %: 60 %
Platelets: 225 10*3/uL (ref 140–400)
RBC: 4.25 MIL/uL (ref 4.20–5.80)
RDW: 14.9 % (ref 11.0–15.0)
WBC: 5.2 10*3/uL (ref 3.8–10.8)

## 2016-07-14 MED ORDER — HYDROCODONE-ACETAMINOPHEN 10-325 MG PO TABS: 1.0000 | ORAL_TABLET | ORAL | 0 refills | Status: DC | PRN

## 2016-07-14 NOTE — Patient Instructions (Signed)
Shingles vaccine and pneumonia vaccine given Recommend you quit smoking- Consider the patches again We will call with lab results F/U 4 months

## 2016-07-14 NOTE — Addendum Note (Signed)
Addended by: Sheral Flow on: 07/14/2016 09:39 AM   Modules accepted: Orders

## 2016-07-14 NOTE — Progress Notes (Signed)
Subjective:   Patient presents for Medicare Annual/Subsequent preventive examination.     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 No Over the past two weeks, have you felt little interest or pleasure in doing things? No Have you lost interest or pleasure in daily life? No Do you often feel hopeless? No Do you cry easily over simple problems? No   Activities of Daily Living  In your present state of health, do you have any difficulty performing the following activities?:  Driving? No  Managing money? No  Feeding yourself? No  Getting from bed to chair? No  Climbing a flight of stairs? No  Preparing food and eating?: No  Bathing or showering? No  Getting dressed: No  Getting to the toilet? No  Using the toilet:No  Moving around from place to place: No  In the past year have you fallen or had a near fall?:No  Are you sexually active? No  Do you have more than one partner? No   Hearing Difficulties: No  Do you often ask people to speak up or repeat themselves? No  Do you experience ringing or noises in your ears? No Do you have difficulty understanding soft or whispered voices? No  Do you feel that you have a problem with memory? No Do you often misplace items? No  Do you feel safe at home? Yes  Cognitive Testing   Alert? Yes Normal Appearance?Yes  Oriented to person? Yes Place? Yes  Time? Yes  Recall of three objects? Yes  Can perform simple calculations? Yes  Displays appropriate judgment?Yes  Can read the correct time from a watch face?Yes   List the Names of Other Physician/Practitioners you currently use: Urology Screening Tests / Date Colonoscopy     UTD                 Shingrix- Due  Prevnar 13  Influenza Vaccine  UTD Tetanus/tdap Due   ROS:  GEN- denies fatigue, fever, weight loss,weakness, recent illness HEENT- denies eye drainage, change in vision, nasal discharge, CVS- denies chest pain, palpitations RESP- denies SOB, cough, wheeze ABD- denies N/V, change in stools, abd pain GU- denies dysuria, hematuria, dribbling, incontinence MSK- denies joint pain, muscle aches, injury Neuro- denies headache, dizziness, syncope, seizure activity  Physical: GEN- NAD, alert and oriented x3 HEENT- PERRL, EOMI, non injected sclera, pink conjunctiva, MMM, oropharynx clear,dentures  Neck- Supple, no thryomegaly CVS- RRR, no murmur  RESP-CTAB ABD-NABS,soft,NT,ND EXT- No edema Pulses- Radial, DP- 2+   Assessment:    Annual wellness medicare exam   Plan:    During the course of the visit the patient was educated and counseled about appropriate screening and preventive services including:   Osteoporosis- continue Fosamax, plan for Bone Density in 2019, continue calcium and vitamin D  Chronic back pain/DDD- pain medication refilled    Hyperlipidemia- on zocor, cardiology recommend LDL down to 70 with family history , recheck lipids today and adjust  Prostate cancer history- on surveillance, needs repeat PSA toDAY   tOBACCO USE- counseled on cessation, he is not ready to quit  Immunizations: Shingles vaccine. - Given /Prevnar 13 Given    Weight loss in setting of recent teeth removal, liquid diet, still not on full diet, anticipate he should rebound back, advsied to drink  boost/ensure   Diet review for nutrition referral? Yes ____ Not Indicated __x__  Patient Instructions (the written plan) was given to the patient.  Medicare Attestation  I have personally reviewed:  The patient's medical and social history  Their use of alcohol, tobacco or illicit drugs  Their current medications and supplements  The patient's functional ability including ADLs,fall risks, home safety risks, cognitive, and hearing and visual impairment  Diet and physical activities  Evidence for depression or mood disorders  The patient's weight, height, BMI, and visual acuity have been recorded in the chart. I have made referrals, counseling, and provided education to the patient based on review of the above and I have provided the patient with a written personalized care plan for preventive services.

## 2016-07-15 LAB — HEPATITIS C ANTIBODY: HCV Ab: NEGATIVE

## 2016-07-15 LAB — PSA: PSA: 0.1 ng/mL (ref <=4.0)

## 2016-07-15 LAB — VITAMIN D 25 HYDROXY (VIT D DEFICIENCY, FRACTURES): Vit D, 25-Hydroxy: 49 ng/mL (ref 30–100)

## 2016-07-16 ENCOUNTER — Encounter: Payer: Self-pay | Admitting: *Deleted

## 2016-07-18 ENCOUNTER — Other Ambulatory Visit: Payer: Self-pay | Admitting: Family Medicine

## 2016-07-25 ENCOUNTER — Other Ambulatory Visit: Payer: Self-pay | Admitting: Family Medicine

## 2016-08-18 ENCOUNTER — Other Ambulatory Visit: Payer: Self-pay | Admitting: Family Medicine

## 2016-08-27 ENCOUNTER — Telehealth: Payer: Self-pay | Admitting: *Deleted

## 2016-08-27 NOTE — Telephone Encounter (Signed)
Okay to refill? 

## 2016-08-27 NOTE — Telephone Encounter (Signed)
Received call from patient.    Requested refill on Hydrocodone.   Ok to refill??  Last office visit/ refill 07/14/2016.

## 2016-08-28 MED ORDER — HYDROCODONE-ACETAMINOPHEN 10-325 MG PO TABS: 1.0000 | ORAL_TABLET | ORAL | 0 refills | Status: DC | PRN

## 2016-08-28 NOTE — Telephone Encounter (Signed)
Prescription printed and patient made aware to come to office to pick up on 08/28/2016 after 3 pm.

## 2016-09-22 ENCOUNTER — Other Ambulatory Visit: Payer: Self-pay | Admitting: Family Medicine

## 2016-09-25 ENCOUNTER — Other Ambulatory Visit: Payer: Self-pay | Admitting: Family Medicine

## 2016-09-25 MED ORDER — HYDROCODONE-ACETAMINOPHEN 10-325 MG PO TABS: 1.0000 | ORAL_TABLET | ORAL | 0 refills | Status: DC | PRN

## 2016-09-25 NOTE — Telephone Encounter (Signed)
Pt wife here, request pain  Medication refill Refilled

## 2016-10-18 ENCOUNTER — Other Ambulatory Visit: Payer: Self-pay | Admitting: Family Medicine

## 2016-10-22 ENCOUNTER — Other Ambulatory Visit: Payer: Self-pay | Admitting: Family Medicine

## 2016-10-27 ENCOUNTER — Other Ambulatory Visit: Payer: Self-pay | Admitting: Family Medicine

## 2016-10-27 NOTE — Telephone Encounter (Signed)
Reviewed database okay to refill

## 2016-10-27 NOTE — Telephone Encounter (Signed)
Last OV 07/14/2016 Last refill 09/25/2016 Ok to refill?

## 2016-10-27 NOTE — Telephone Encounter (Signed)
Refill on hydrocodone

## 2016-10-28 MED ORDER — HYDROCODONE-ACETAMINOPHEN 10-325 MG PO TABS: 1.0000 | ORAL_TABLET | ORAL | 0 refills | Status: DC | PRN

## 2016-10-28 NOTE — Telephone Encounter (Signed)
rx printed can be picked up at front desk spoke with wife

## 2016-11-16 ENCOUNTER — Ambulatory Visit (INDEPENDENT_AMBULATORY_CARE_PROVIDER_SITE_OTHER): Payer: Medicare Other | Admitting: Family Medicine

## 2016-11-16 ENCOUNTER — Other Ambulatory Visit: Payer: Self-pay | Admitting: Family Medicine

## 2016-11-16 ENCOUNTER — Encounter: Payer: Self-pay | Admitting: Family Medicine

## 2016-11-16 VITALS — BP 132/86 | HR 60 | Temp 98.4°F | Resp 18 | Wt 165.4 lb

## 2016-11-16 DIAGNOSIS — F17200 Nicotine dependence, unspecified, uncomplicated: Secondary | ICD-10-CM | POA: Diagnosis not present

## 2016-11-16 DIAGNOSIS — G2581 Restless legs syndrome: Secondary | ICD-10-CM | POA: Diagnosis not present

## 2016-11-16 DIAGNOSIS — E7800 Pure hypercholesterolemia, unspecified: Secondary | ICD-10-CM

## 2016-11-16 DIAGNOSIS — M5136 Other intervertebral disc degeneration, lumbar region: Secondary | ICD-10-CM

## 2016-11-16 DIAGNOSIS — Z23 Encounter for immunization: Secondary | ICD-10-CM | POA: Diagnosis not present

## 2016-11-16 MED ORDER — ROPINIROLE HCL 0.5 MG PO TABS: 0.5000 mg | ORAL_TABLET | Freq: Every evening | ORAL | 2 refills | Status: DC

## 2016-11-16 MED ORDER — ROPINIROLE HCL 2 MG PO TABS: 2.0000 mg | ORAL_TABLET | Freq: Every day | ORAL | 3 refills | Status: DC

## 2016-11-16 MED ORDER — HYDROCODONE-ACETAMINOPHEN 10-325 MG PO TABS: 1.0000 | ORAL_TABLET | ORAL | 0 refills | Status: DC | PRN

## 2016-11-16 NOTE — Assessment & Plan Note (Signed)
Pain medication refilled

## 2016-11-16 NOTE — Patient Instructions (Addendum)
Take the new requip around 5pm Then take night time around 9pm Flu shot given  Continue current medications F/U 4 month

## 2016-11-16 NOTE — Assessment & Plan Note (Signed)
I will have him take Requip 0.5 mg around 5 PM and continue 2 mg at bedtime

## 2016-11-16 NOTE — Progress Notes (Signed)
   Subjective:    Patient ID: Cory Foley, male    DOB: 12-Mar-1950, 66 y.o.   MRN: [id number]  Patient presents for routine follow up (is fasting)   Pt here to f/u chronic medical problems  Medications reviewed  Hyperlipidemia on zocor  Chronic paiin- on  Norco   RLS- restless legs continue to be an issue , taking Requip 2mg  at bedtime , still not sleeping well. Will only sleep about 4 hours and has to get up   Flu Shot due     Review Of Systemms:  GEN- denies fatigue, fever, weight loss,weakness, recent illness HEENT- denies eye drainage, change in vision, nasal discharge, CVS- denies chest pain, palpitations RESP- denies SOB, cough, wheeze ABD- denies N/V, change in stools, abd pain GU- denies dysuria, hematuria, dribbling, incontinence MSK- + joint pain, +muscle aches, injury Neuro- denies headache, dizziness, syncope, seizure activity       Objective:    BP 132/86 (BP Location: Right Arm, Patient Position: Sitting, Cuff Size: Normal)   Pulse 60   Temp 98.4 F (36.9 C) (Oral)   Resp 18   Wt 165 lb 6.4 oz (75 kg)   BMI 25.52 kg/m  GEN- NAD, alert and oriented x3 HEENT- PERRL, EOMI, non injected sclera, pink conjunctiva, MMM, oropharynx clear CVS- RRR, no murmur RESP-CTAB EXT- No edema Pulses- Radial, DP- 2+        Assessment & Plan:      Problem List Items Addressed This Visit      Unprioritized   TOBACCO ABUSE    Continue to work on tobacco cessation he used the nicotine patches for a time.      RLS (restless legs syndrome) - Primary    I will have him take Requip 0.5 mg around 5 PM and continue 2 mg at bedtime      Hyperlipidemia    Lipids at goal, no change to zocor       DDD (degenerative disc disease), lumbar    Pain medication refilled      Relevant Medications   HYDROcodone-acetaminophen (NORCO) 10-325 MG tablet    Other Visit Diagnoses    Need for prophylactic vaccination and inoculation against influenza       Relevant Orders    Flu Vaccine QUAD 6+ mos PF IM (Fluarix Quad PF) (Completed)      Note: This dictation was prepared with Dragon dictation along with smaller phrase technology. Any transcriptional errors that result from this process are unintentional.

## 2016-11-16 NOTE — Assessment & Plan Note (Signed)
Lipids at goal, no change to zocor

## 2016-11-16 NOTE — Assessment & Plan Note (Signed)
Continue to work on tobacco cessation he used the nicotine patches for a time.

## 2016-11-16 NOTE — Telephone Encounter (Signed)
Medication refilled per protocol. 

## 2016-11-19 ENCOUNTER — Other Ambulatory Visit: Payer: Self-pay | Admitting: Family Medicine

## 2016-12-15 ENCOUNTER — Other Ambulatory Visit: Payer: Self-pay | Admitting: Family Medicine

## 2016-12-18 ENCOUNTER — Telehealth: Payer: Self-pay | Admitting: *Deleted

## 2016-12-18 MED ORDER — HYDROCODONE-ACETAMINOPHEN 10-325 MG PO TABS: 1.0000 | ORAL_TABLET | ORAL | 0 refills | Status: DC | PRN

## 2016-12-18 NOTE — Telephone Encounter (Signed)
Received call from patient.   Requested refill on Hydrocodone.   Ok to refill??  Last office visit/ refill 11/16/2016.

## 2016-12-18 NOTE — Telephone Encounter (Signed)
Okay to refill? 

## 2016-12-18 NOTE — Telephone Encounter (Signed)
Prescription printed and patient made aware to come to office to pick up.  

## 2016-12-21 ENCOUNTER — Other Ambulatory Visit: Payer: Self-pay | Admitting: Family Medicine

## 2017-01-18 ENCOUNTER — Telehealth: Payer: Self-pay | Admitting: *Deleted

## 2017-01-18 MED ORDER — HYDROCODONE-ACETAMINOPHEN 10-325 MG PO TABS: 1.0000 | ORAL_TABLET | ORAL | 0 refills | Status: DC | PRN

## 2017-01-18 NOTE — Telephone Encounter (Signed)
Received call from patient.   Requested refill on Hydrocodone.   Ok to refill??  Last office visit 11/16/2016.  Last refill 12/18/2016.

## 2017-01-18 NOTE — Telephone Encounter (Signed)
Okay to refill? 

## 2017-01-18 NOTE — Telephone Encounter (Signed)
Prescription printed and patient made aware to come to office to pick up.  Patient will pick up on 01/19/2017.

## 2017-01-20 ENCOUNTER — Other Ambulatory Visit: Payer: Self-pay | Admitting: Family Medicine

## 2017-01-26 ENCOUNTER — Other Ambulatory Visit: Payer: Self-pay | Admitting: Family Medicine

## 2017-01-26 MED ORDER — HYDROCODONE-ACETAMINOPHEN 10-325 MG PO TABS: 1.0000 | ORAL_TABLET | ORAL | 0 refills | Status: DC | PRN

## 2017-01-26 NOTE — Telephone Encounter (Signed)
Script for December given

## 2017-02-08 ENCOUNTER — Other Ambulatory Visit: Payer: Self-pay | Admitting: Family Medicine

## 2017-02-19 ENCOUNTER — Other Ambulatory Visit: Payer: Self-pay | Admitting: Family Medicine

## 2017-03-08 ENCOUNTER — Other Ambulatory Visit: Payer: Self-pay | Admitting: *Deleted

## 2017-03-08 MED ORDER — HYDROCODONE-ACETAMINOPHEN 10-325 MG PO TABS: 1.0000 | ORAL_TABLET | ORAL | 0 refills | Status: DC | PRN

## 2017-03-08 NOTE — Telephone Encounter (Signed)
Received call from patient wife, Holley Raring.   Requested refill on Hydrocodone/ APAP.   Ok to refill??  Last office visit 11/16/2016.  Last refill 01/26/2017.

## 2017-03-10 ENCOUNTER — Other Ambulatory Visit: Payer: Self-pay | Admitting: Family Medicine

## 2017-03-11 ENCOUNTER — Other Ambulatory Visit: Payer: Self-pay | Admitting: Family Medicine

## 2017-03-22 ENCOUNTER — Ambulatory Visit (INDEPENDENT_AMBULATORY_CARE_PROVIDER_SITE_OTHER): Payer: Medicare Other | Admitting: Family Medicine

## 2017-03-22 ENCOUNTER — Encounter: Payer: Self-pay | Admitting: Family Medicine

## 2017-03-22 ENCOUNTER — Other Ambulatory Visit: Payer: Self-pay

## 2017-03-22 VITALS — BP 132/64 | HR 56 | Temp 97.8°F | Resp 16 | Ht 67.5 in | Wt 178.0 lb

## 2017-03-22 DIAGNOSIS — E7800 Pure hypercholesterolemia, unspecified: Secondary | ICD-10-CM

## 2017-03-22 DIAGNOSIS — I739 Peripheral vascular disease, unspecified: Secondary | ICD-10-CM

## 2017-03-22 DIAGNOSIS — M5136 Other intervertebral disc degeneration, lumbar region: Secondary | ICD-10-CM

## 2017-03-22 DIAGNOSIS — G2581 Restless legs syndrome: Secondary | ICD-10-CM

## 2017-03-22 LAB — CBC WITH DIFFERENTIAL/PLATELET
Basophils Absolute: 17 cells/uL (ref 0–200)
Basophils Relative: 0.3 %
Eosinophils Absolute: 139 cells/uL (ref 15–500)
Eosinophils Relative: 2.4 %
HCT: 40 % (ref 38.5–50.0)
Hemoglobin: 13.6 g/dL (ref 13.2–17.1)
Lymphs Abs: 1357 cells/uL (ref 850–3900)
MCH: 31.6 pg (ref 27.0–33.0)
MCHC: 34 g/dL (ref 32.0–36.0)
MCV: 93 fL (ref 80.0–100.0)
MPV: 10.7 fL (ref 7.5–12.5)
Monocytes Relative: 8.1 %
Neutro Abs: 3816 cells/uL (ref 1500–7800)
Neutrophils Relative %: 65.8 %
Platelets: 201 10*3/uL (ref 140–400)
RBC: 4.3 10*6/uL (ref 4.20–5.80)
RDW: 13.4 % (ref 11.0–15.0)
Total Lymphocyte: 23.4 %
WBC mixed population: 470 cells/uL (ref 200–950)
WBC: 5.8 10*3/uL (ref 3.8–10.8)

## 2017-03-22 LAB — LIPID PANEL
Cholesterol: 176 mg/dL (ref <200)
HDL: 49 mg/dL (ref >40)
LDL Cholesterol (Calc): 107 mg/dL (calc) — ABNORMAL HIGH
Non-HDL Cholesterol (Calc): 127 mg/dL (calc) (ref <130)
Total CHOL/HDL Ratio: 3.6 (calc) (ref <5.0)
Triglycerides: 106 mg/dL (ref <150)

## 2017-03-22 LAB — COMPREHENSIVE METABOLIC PANEL
AG Ratio: 1.7 (calc) (ref 1.0–2.5)
ALT: 12 U/L (ref 9–46)
AST: 14 U/L (ref 10–35)
Albumin: 4.1 g/dL (ref 3.6–5.1)
Alkaline phosphatase (APISO): 49 U/L (ref 40–115)
BUN: 9 mg/dL (ref 7–25)
CO2: 30 mmol/L (ref 20–32)
Calcium: 9.4 mg/dL (ref 8.6–10.3)
Chloride: 107 mmol/L (ref 98–110)
Creat: 0.88 mg/dL (ref 0.70–1.25)
Globulin: 2.4 g/dL (calc) (ref 1.9–3.7)
Glucose, Bld: 100 mg/dL — ABNORMAL HIGH (ref 65–99)
Potassium: 5 mmol/L (ref 3.5–5.3)
Sodium: 141 mmol/L (ref 135–146)
Total Bilirubin: 0.3 mg/dL (ref 0.2–1.2)
Total Protein: 6.5 g/dL (ref 6.1–8.1)

## 2017-03-22 MED ORDER — SIMVASTATIN 40 MG PO TABS: ORAL_TABLET | ORAL | 2 refills | Status: DC

## 2017-03-22 MED ORDER — ROPINIROLE HCL 0.5 MG PO TABS: 0.5000 mg | ORAL_TABLET | Freq: Every evening | ORAL | 2 refills | Status: DC

## 2017-03-22 MED ORDER — PANTOPRAZOLE SODIUM 40 MG PO TBEC: 40.0000 mg | DELAYED_RELEASE_TABLET | Freq: Every day | ORAL | 2 refills | Status: DC

## 2017-03-22 MED ORDER — ALENDRONATE SODIUM 70 MG PO TABS: ORAL_TABLET | ORAL | 2 refills | Status: DC

## 2017-03-22 MED ORDER — ROPINIROLE HCL 2 MG PO TABS: 2.0000 mg | ORAL_TABLET | Freq: Every day | ORAL | 2 refills | Status: DC

## 2017-03-22 NOTE — Patient Instructions (Signed)
F/U 1st week of June for Physical  Continue current medications

## 2017-03-22 NOTE — Assessment & Plan Note (Signed)
On statin and ASA, check lipids and lFT

## 2017-03-22 NOTE — Progress Notes (Signed)
   Subjective:    Patient ID: Cory Foley, male    DOB: 03-25-1950, 67 y.o.   MRN: [id number]  Patient presents for Follow-up (is fasting)  Pt here to f/u chronic medical problems. Medications reviewed  Chronic pain- continued on hydrocodone , DDD lumbar   PVD/Hyperlipidemia on statin drug   COPD- does not  use albuterol, continues to smoke   Weight up about 10lbs appeite improved, feels good right now No concerns     Review Of Systems:  GEN- denies fatigue, fever, weight loss,weakness, recent illness HEENT- denies eye drainage, change in vision, nasal discharge, CVS- denies chest pain, palpitations RESP- denies SOB, cough, wheeze ABD- denies N/V, change in stools, abd pain GU- denies dysuria, hematuria, dribbling, incontinence MSK- denies joint pain, muscle aches, injury Neuro- denies headache, dizziness, syncope, seizure activity       Objective:    BP 132/64   Pulse (!) 56   Temp 97.8 F (36.6 C) (Oral)   Resp 16   Ht 5' 7.5" (1.715 m)   Wt 178 lb (80.7 kg)   SpO2 96%   BMI 27.47 kg/m  GEN- NAD, alert and oriented x3 HEENT- PERRL, EOMI, non injected sclera, pink conjunctiva, MMM, oropharynx clear CVS- RRR, no murmur RESP-CTAB EXT- No edema Pulses- Radial, DP- 2+        Assessment & Plan:      Problem List Items Addressed This Visit      Unprioritized   PVD (peripheral vascular disease) ([place])   Relevant Orders   CBC with Differential/Platelet   Comprehensive metabolic panel   RLS (restless legs syndrome)    Continue Requip      Hyperlipidemia - Primary    On statin and ASA, check lipids and lFT      Relevant Orders   CBC with Differential/Platelet   Comprehensive metabolic panel   Lipid panel   DDD (degenerative disc disease), lumbar    Continued on hydrocodone, he has to assit both parents who are in 69's, one wheelchair bound          Note: This dictation was prepared with Dragon dictation along with smaller phrase technology. Any  transcriptional errors that result from this process are unintentional.

## 2017-03-22 NOTE — Assessment & Plan Note (Signed)
Continued on hydrocodone, he has to assit both parents who are in 13's, one wheelchair bound

## 2017-03-22 NOTE — Assessment & Plan Note (Signed)
Continue Requip. 

## 2017-04-07 ENCOUNTER — Other Ambulatory Visit: Payer: Self-pay | Admitting: Family Medicine

## 2017-04-07 NOTE — Telephone Encounter (Signed)
Ok to refill??  Last office visit 03/22/2017.  Last refill 03/08/2017.

## 2017-04-26 ENCOUNTER — Telehealth: Payer: Self-pay | Admitting: *Deleted

## 2017-04-26 NOTE — Telephone Encounter (Signed)
Received call from [facility] with [place] [phone]~ telephone.   Reports that order for Reclast was received, but section inquiring about pre-authorization was blank.   Requested office to correct blank items and re-fax to [facility].

## 2017-04-26 NOTE — Telephone Encounter (Signed)
Per OM, PA is not required.   Original order still in office.  Re-faxed to [facility].

## 2017-05-03 ENCOUNTER — Other Ambulatory Visit: Payer: Self-pay | Admitting: Family Medicine

## 2017-05-03 NOTE — Telephone Encounter (Signed)
Ok to refill??  Last office visit 03/22/2017.  Last refill 04/07/2017.

## 2017-05-21 ENCOUNTER — Encounter (HOSPITAL_COMMUNITY)
Admission: RE | Admit: 2017-05-21 | Discharge: 2017-05-21 | Disposition: A | Discharge status: Home / Self Care | Payer: Medicare Other | Source: Ambulatory Visit | Attending: Family Medicine | Admitting: Family Medicine

## 2017-05-21 ENCOUNTER — Encounter (HOSPITAL_COMMUNITY): Payer: Self-pay

## 2017-05-21 DIAGNOSIS — M810 Age-related osteoporosis without current pathological fracture: Secondary | ICD-10-CM | POA: Insufficient documentation

## 2017-05-21 MED ORDER — ZOLEDRONIC ACID 5 MG/100ML IV SOLN
5.0000 mg | Freq: Once | INTRAVENOUS | Status: AC
  Administered 2017-05-21: 5 mg via INTRAVENOUS
  Filled 2017-05-21: qty 100

## 2017-05-21 MED ORDER — SODIUM CHLORIDE 0.9 % IV SOLN
Freq: Once | INTRAVENOUS | Status: AC
  Administered 2017-05-21: 09:00:00 via INTRAVENOUS

## 2017-05-24 ENCOUNTER — Other Ambulatory Visit: Payer: Self-pay | Admitting: Family Medicine

## 2017-05-24 NOTE — Telephone Encounter (Signed)
Ok to refill??  Last office visit 03/22/2017.  Last refill 05/03/2017.

## 2017-06-14 ENCOUNTER — Other Ambulatory Visit: Payer: Self-pay | Admitting: *Deleted

## 2017-06-14 NOTE — Telephone Encounter (Signed)
Patient wife in office for labs.   Requested refill on Hydrocodone/ APAP.   Ok to refill??  Last office visit 03/22/2017.  Last refill 05/24/2017.

## 2017-06-14 NOTE — Telephone Encounter (Signed)
Not due until 5/1 correct?

## 2017-06-22 ENCOUNTER — Other Ambulatory Visit: Payer: Self-pay | Admitting: Family Medicine

## 2017-06-22 NOTE — Telephone Encounter (Signed)
Ok to refill??  Last office visit 03/22/2017.  Last refill 05/24/2017.

## 2017-07-26 ENCOUNTER — Other Ambulatory Visit: Payer: Self-pay

## 2017-07-26 ENCOUNTER — Encounter: Payer: Self-pay | Admitting: Family Medicine

## 2017-07-26 ENCOUNTER — Ambulatory Visit (INDEPENDENT_AMBULATORY_CARE_PROVIDER_SITE_OTHER): Payer: Medicare Other | Admitting: Family Medicine

## 2017-07-26 VITALS — BP 124/62 | HR 60 | Temp 97.9°F | Resp 12 | Ht 67.5 in | Wt 173.0 lb

## 2017-07-26 DIAGNOSIS — Z0000 Encounter for general adult medical examination without abnormal findings: Secondary | ICD-10-CM

## 2017-07-26 DIAGNOSIS — I739 Peripheral vascular disease, unspecified: Secondary | ICD-10-CM

## 2017-07-26 DIAGNOSIS — M810 Age-related osteoporosis without current pathological fracture: Secondary | ICD-10-CM | POA: Diagnosis not present

## 2017-07-26 DIAGNOSIS — C61 Malignant neoplasm of prostate: Secondary | ICD-10-CM

## 2017-07-26 DIAGNOSIS — J42 Unspecified chronic bronchitis: Secondary | ICD-10-CM

## 2017-07-26 DIAGNOSIS — F17200 Nicotine dependence, unspecified, uncomplicated: Secondary | ICD-10-CM

## 2017-07-26 DIAGNOSIS — G2581 Restless legs syndrome: Secondary | ICD-10-CM

## 2017-07-26 DIAGNOSIS — E7800 Pure hypercholesterolemia, unspecified: Secondary | ICD-10-CM | POA: Diagnosis not present

## 2017-07-26 MED ORDER — ZOSTER VAC RECOMB ADJUVANTED 50 MCG/0.5ML IM SUSR: 0.5000 mL | Freq: Once | INTRAMUSCULAR | 0 refills | Status: AC

## 2017-07-26 MED ORDER — VITAMIN D3 25 MCG (1000 UNIT) PO TABS: 1000.0000 [IU] | ORAL_TABLET | Freq: Every day | ORAL | Status: DC

## 2017-07-26 MED ORDER — HYDROCODONE-ACETAMINOPHEN 10-325 MG PO TABS: ORAL_TABLET | ORAL | 0 refills | Status: DC

## 2017-07-26 MED ORDER — TETANUS-DIPHTH-ACELL PERTUSSIS 5-2.5-18.5 LF-MCG/0.5 IM SUSP: 0.5000 mL | Freq: Once | INTRAMUSCULAR | 0 refills | Status: AC

## 2017-07-26 MED ORDER — CALCIUM 600 MG PO TABS: 600.0000 mg | ORAL_TABLET | Freq: Two times a day (BID) | ORAL | Status: AC

## 2017-07-26 NOTE — Addendum Note (Signed)
Addended by: Sheral Flow on: 07/26/2017 10:07 AM   Modules accepted: Orders

## 2017-07-26 NOTE — Patient Instructions (Addendum)
Take Requip 3mg  at bedtime  Try for 1 month  Continue pain meds  Tetanus and shingrix sent to pharmacy Osteoporosis- Bone Density to be set up  F/U 6 months
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
## 2017-07-27 LAB — LIPID PANEL
Cholesterol: 164 mg/dL (ref <200)
HDL: 45 mg/dL (ref >40)
LDL Cholesterol (Calc): 103 mg/dL (calc) — ABNORMAL HIGH
Non-HDL Cholesterol (Calc): 119 mg/dL (calc) (ref <130)
Total CHOL/HDL Ratio: 3.6 (calc) (ref <5.0)
Triglycerides: 74 mg/dL (ref <150)

## 2017-07-27 LAB — CBC WITH DIFFERENTIAL/PLATELET
Basophils Absolute: 20 cells/uL (ref 0–200)
Basophils Relative: 0.3 %
Eosinophils Absolute: 158 cells/uL (ref 15–500)
Eosinophils Relative: 2.4 %
HCT: 40.9 % (ref 38.5–50.0)
Hemoglobin: 13.8 g/dL (ref 13.2–17.1)
Lymphs Abs: 1234 cells/uL (ref 850–3900)
MCH: 31.7 pg (ref 27.0–33.0)
MCHC: 33.7 g/dL (ref 32.0–36.0)
MCV: 93.8 fL (ref 80.0–100.0)
MPV: 11.1 fL (ref 7.5–12.5)
Monocytes Relative: 7.9 %
Neutro Abs: 4666 cells/uL (ref 1500–7800)
Neutrophils Relative %: 70.7 %
Platelets: 219 10*3/uL (ref 140–400)
RBC: 4.36 10*6/uL (ref 4.20–5.80)
RDW: 12.9 % (ref 11.0–15.0)
Total Lymphocyte: 18.7 %
WBC mixed population: 521 cells/uL (ref 200–950)
WBC: 6.6 10*3/uL (ref 3.8–10.8)

## 2017-07-27 LAB — COMPREHENSIVE METABOLIC PANEL
AG Ratio: 2 (calc) (ref 1.0–2.5)
ALT: 9 U/L (ref 9–46)
AST: 12 U/L (ref 10–35)
Albumin: 4.3 g/dL (ref 3.6–5.1)
Alkaline phosphatase (APISO): 54 U/L (ref 40–115)
BUN: 9 mg/dL (ref 7–25)
CO2: 27 mmol/L (ref 20–32)
Calcium: 9.3 mg/dL (ref 8.6–10.3)
Chloride: 108 mmol/L (ref 98–110)
Creat: 0.76 mg/dL (ref 0.70–1.25)
Globulin: 2.2 g/dL (calc) (ref 1.9–3.7)
Glucose, Bld: 101 mg/dL — ABNORMAL HIGH (ref 65–99)
Potassium: 5 mmol/L (ref 3.5–5.3)
Sodium: 141 mmol/L (ref 135–146)
Total Bilirubin: 0.4 mg/dL (ref 0.2–1.2)
Total Protein: 6.5 g/dL (ref 6.1–8.1)

## 2017-07-27 LAB — PSA: PSA: <0.1 ng/mL (ref <=4.0)

## 2017-07-29 ENCOUNTER — Encounter: Payer: Self-pay | Admitting: *Deleted

## 2017-08-06 ENCOUNTER — Ambulatory Visit (HOSPITAL_COMMUNITY)
Admission: RE | Admit: 2017-08-06 | Discharge: 2017-08-06 | Disposition: A | Discharge status: Home / Self Care | Payer: Medicare Other | Source: Ambulatory Visit | Attending: Family Medicine | Admitting: Family Medicine

## 2017-08-06 ENCOUNTER — Encounter: Payer: Self-pay | Admitting: *Deleted

## 2017-08-06 DIAGNOSIS — M85852 Other specified disorders of bone density and structure, left thigh: Secondary | ICD-10-CM | POA: Diagnosis not present

## 2017-08-06 DIAGNOSIS — M810 Age-related osteoporosis without current pathological fracture: Secondary | ICD-10-CM | POA: Insufficient documentation

## 2017-08-06 DIAGNOSIS — M85851 Other specified disorders of bone density and structure, right thigh: Secondary | ICD-10-CM | POA: Diagnosis not present

## 2017-08-17 ENCOUNTER — Encounter: Payer: Self-pay | Admitting: Gastroenterology

## 2017-08-25 ENCOUNTER — Other Ambulatory Visit: Payer: Self-pay | Admitting: Family Medicine

## 2017-08-25 NOTE — Telephone Encounter (Signed)
Ok to refill??  Last office visit/ refill 07/26/2017.

## 2017-08-30 ENCOUNTER — Other Ambulatory Visit: Payer: Self-pay | Admitting: *Deleted

## 2017-08-30 MED ORDER — ROPINIROLE HCL 3 MG PO TABS: 3.0000 mg | ORAL_TABLET | Freq: Every day | ORAL | 3 refills | Status: DC

## 2017-09-27 ENCOUNTER — Other Ambulatory Visit: Payer: Self-pay | Admitting: Family Medicine

## 2017-09-27 NOTE — Telephone Encounter (Signed)
Ok to refill??  Last office visit 07/26/2017.  Last refill 08/25/2017.

## 2017-10-27 ENCOUNTER — Other Ambulatory Visit: Payer: Self-pay | Admitting: Family Medicine

## 2017-10-27 NOTE — Telephone Encounter (Signed)
Ok to refill??  Last office visit 07/26/2017.  Last refill 09/27/2017.

## 2017-11-29 ENCOUNTER — Other Ambulatory Visit: Payer: Self-pay | Admitting: *Deleted

## 2017-11-29 MED ORDER — HYDROCODONE-ACETAMINOPHEN 10-325 MG PO TABS: ORAL_TABLET | ORAL | 0 refills | Status: DC

## 2017-11-29 NOTE — Telephone Encounter (Signed)
Received call from patient.  Requested refill on Hydrocodone/APAP.   Ok to refill??  Last office visit 07/26/2017.  Last refill 10/27/2017.

## 2017-12-31 ENCOUNTER — Other Ambulatory Visit: Payer: Self-pay | Admitting: *Deleted

## 2017-12-31 MED ORDER — HYDROCODONE-ACETAMINOPHEN 10-325 MG PO TABS: ORAL_TABLET | ORAL | 0 refills | Status: DC

## 2017-12-31 NOTE — Telephone Encounter (Signed)
Received call from patient.   Requested refill on Hydrocodone/ APAP.   Ok to refill??  Last office visit 07/26/2017.  Last refill 11/29/2017.

## 2018-01-12 ENCOUNTER — Other Ambulatory Visit: Payer: Self-pay | Admitting: Family Medicine

## 2018-01-25 ENCOUNTER — Encounter: Payer: Self-pay | Admitting: Family Medicine

## 2018-01-25 ENCOUNTER — Ambulatory Visit (INDEPENDENT_AMBULATORY_CARE_PROVIDER_SITE_OTHER): Payer: Medicare Other | Admitting: Family Medicine

## 2018-01-25 ENCOUNTER — Other Ambulatory Visit: Payer: Self-pay

## 2018-01-25 VITALS — BP 122/70 | HR 58 | Temp 98.9°F | Resp 14 | Ht 67.5 in | Wt 176.0 lb

## 2018-01-25 DIAGNOSIS — E7800 Pure hypercholesterolemia, unspecified: Secondary | ICD-10-CM

## 2018-01-25 DIAGNOSIS — C61 Malignant neoplasm of prostate: Secondary | ICD-10-CM

## 2018-01-25 DIAGNOSIS — I739 Peripheral vascular disease, unspecified: Secondary | ICD-10-CM | POA: Diagnosis not present

## 2018-01-25 DIAGNOSIS — Z23 Encounter for immunization: Secondary | ICD-10-CM

## 2018-01-25 DIAGNOSIS — G2581 Restless legs syndrome: Secondary | ICD-10-CM | POA: Diagnosis not present

## 2018-01-25 DIAGNOSIS — M5136 Other intervertebral disc degeneration, lumbar region: Secondary | ICD-10-CM

## 2018-01-25 DIAGNOSIS — R5382 Chronic fatigue, unspecified: Secondary | ICD-10-CM | POA: Diagnosis not present

## 2018-01-25 DIAGNOSIS — M1991 Primary osteoarthritis, unspecified site: Secondary | ICD-10-CM

## 2018-01-25 DIAGNOSIS — Z636 Dependent relative needing care at home: Secondary | ICD-10-CM

## 2018-01-25 DIAGNOSIS — Z79899 Other long term (current) drug therapy: Secondary | ICD-10-CM | POA: Diagnosis not present

## 2018-01-25 DIAGNOSIS — M810 Age-related osteoporosis without current pathological fracture: Secondary | ICD-10-CM

## 2018-01-25 DIAGNOSIS — F5101 Primary insomnia: Secondary | ICD-10-CM

## 2018-01-25 MED ORDER — HYDROCODONE-ACETAMINOPHEN 10-325 MG PO TABS: ORAL_TABLET | ORAL | 0 refills | Status: DC

## 2018-01-25 MED ORDER — TRAZODONE HCL 50 MG PO TABS: 25.0000 mg | ORAL_TABLET | Freq: Every evening | ORAL | 3 refills | Status: DC | PRN

## 2018-01-25 NOTE — Patient Instructions (Addendum)
F/U 3 months  Start trazodone, take 1/2 tablet, then go to full tablet

## 2018-01-25 NOTE — Assessment & Plan Note (Signed)
Bone density done this year.  He is on calcium vitamin D as well as a Reclast infusion

## 2018-01-25 NOTE — Assessment & Plan Note (Signed)
Insomnia worsened by his caregiver stress.  We will try him on trazodone can start with 25 mg work up to 50 mg at bedtime

## 2018-01-25 NOTE — Assessment & Plan Note (Signed)
Chronic pain from arthritis and degenerative disc he is on hydrocodone

## 2018-01-25 NOTE — Assessment & Plan Note (Addendum)
Currently on surveillance his PSAs have been undetectable He does have some urinary problems, with complete emptying, recommend he call his urologist, has been on flomax and rapaflo in the past, little improvement

## 2018-01-25 NOTE — Progress Notes (Signed)
Subjective:    Patient ID: Cory Foley, male    DOB: 1950-05-19, 67 y.o.   MRN: [id number]  Patient presents for Follow-up (is fasting)  She had a follow-up chronic medical problems.  He has chronic pain secondary to back pain history of prostate cancer he has chronic bony pain especially in the pelvic region though no meds.  He also has restless leg syndrome and peripheral vascular disease. He suffers with chronic fatigue as well.  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  Stress levels have been high.  He does not get any help from his siblings were taking care of his father.  His wife does help.  He often lays awake thinking about the things that needs to be done.  His father is very needy throughout the day as well which makes it difficult for him to get his regular activities done.  He does not sleep well has been on melatonin for the past 2 years but no longer works.  Osteoporosis he is on Reclast  Prostate cancer with currently undetectable PSA, he does follow with urology.  He does have some difficulty completely emptying his bladder was on medications in the past.   Review Of Systems:  GEN- denies fatigue, fever, weight loss,weakness, recent illness HEENT- denies eye drainage, change in vision, nasal discharge, CVS- denies chest pain, palpitations RESP- denies SOB, cough, wheeze ABD- denies N/V, change in stools, abd pain GU- denies dysuria, hematuria, dribbling, incontinence MSK- + joint pain, muscle aches, injury Neuro- denies headache, dizziness, syncope, seizure activity       Objective:    BP 122/70   Pulse (!) 58   Temp 98.9 F (37.2 C) (Oral)   Resp 14   Ht 5' 7.5" (1.715 m)   Wt 176 lb (79.8 kg)   SpO2 93%   BMI 27.16 kg/m  GEN- NAD, alert and oriented x3 HEENT- PERRL, EOMI, non injected sclera, pink conjunctiva, MMM, oropharynx clear Neck- Supple, no thyromegaly CVS- RRR, no  murmur RESP-CTAB ABD-NABS,soft,NT,ND Psych- stressed appearing, not anxious, no SI, well groomed  EXT- No edema Pulses- Radial, DP- 2+        Assessment & Plan:      Problem List Items Addressed This Visit      Unprioritized   Chronic fatigue   DDD (degenerative disc disease), lumbar    Chronic pain from arthritis and degenerative disc he is on hydrocodone      Relevant Medications   HYDROcodone-acetaminophen (NORCO) 10-325 MG tablet   Hyperlipidemia   Relevant Orders   Comprehensive metabolic panel   Insomnia    Insomnia worsened by his caregiver stress.  We will try him on trazodone can start with 25 mg work up to 50 mg at bedtime      Osteoarthritis   Relevant Medications   HYDROcodone-acetaminophen (NORCO) 10-325 MG tablet   Osteoporosis    Bone density done this year.  He is on calcium vitamin D as well as a Reclast infusion      Prostate cancer ([place])    Currently on surveillance his PSAs have been undetectable He does have some urinary problems, with complete emptying, recommend he call his urologist, has been on flomax and rapaflo in the past, little improvement      PVD (peripheral vascular disease) ([place]) - Primary   Relevant Orders   Comprehensive metabolic panel   RLS (restless legs syndrome)    Other Visit Diagnoses  Caregiver stress       Long-term use of high-risk medication       Relevant Orders   Drugs of abuse screen w/o alc, rtn urine-sln   Need for prophylactic vaccination and inoculation against influenza       Relevant Orders   Flu vaccine HIGH DOSE PF (Fluzone High dose) (Completed)      Note: This dictation was prepared with Dragon dictation along with smaller phrase technology. Any transcriptional errors that result from this process are unintentional.

## 2018-01-27 ENCOUNTER — Other Ambulatory Visit: Payer: Self-pay | Admitting: Family Medicine

## 2018-01-30 LAB — COMPREHENSIVE METABOLIC PANEL
AG Ratio: 2 (calc) (ref 1.0–2.5)
ALT: 9 U/L (ref 9–46)
AST: 12 U/L (ref 10–35)
Albumin: 4.3 g/dL (ref 3.6–5.1)
Alkaline phosphatase (APISO): 53 U/L (ref 40–115)
BUN: 10 mg/dL (ref 7–25)
CO2: 28 mmol/L (ref 20–32)
Calcium: 9.9 mg/dL (ref 8.6–10.3)
Chloride: 107 mmol/L (ref 98–110)
Creat: 0.85 mg/dL (ref 0.70–1.25)
Globulin: 2.2 g/dL (calc) (ref 1.9–3.7)
Glucose, Bld: 97 mg/dL (ref 65–99)
Potassium: 5 mmol/L (ref 3.5–5.3)
Sodium: 141 mmol/L (ref 135–146)
Total Bilirubin: 0.4 mg/dL (ref 0.2–1.2)
Total Protein: 6.5 g/dL (ref 6.1–8.1)

## 2018-01-30 LAB — DRUGS OF ABUSE SCREEN W/O ALC, ROUTINE URINE
AMPHETAMINES (1000 ng/mL SCRN): NEGATIVE
BARBITURATES: NEGATIVE
BENZODIAZEPINES: NEGATIVE
COCAINE METABOLITES: NEGATIVE
HYDROCODONE: POSITIVE — AB
HYDROMORPHONE: POSITIVE — AB
MARIJUANA MET (50 ng/mL SCRN): NEGATIVE
METHADONE: NEGATIVE
METHAQUALONE: NEGATIVE
OPIATES: POSITIVE — AB
PHENCYCLIDINE: NEGATIVE
PROPOXYPHENE: NEGATIVE

## 2018-02-21 ENCOUNTER — Other Ambulatory Visit: Payer: Self-pay | Admitting: Family Medicine

## 2018-02-24 ENCOUNTER — Other Ambulatory Visit: Payer: Self-pay | Admitting: Family Medicine

## 2018-02-24 NOTE — Telephone Encounter (Signed)
Ok to refill??  Last office visit/ refill 01/25/2018.

## 2018-03-28 ENCOUNTER — Other Ambulatory Visit: Payer: Self-pay | Admitting: Family Medicine

## 2018-03-28 NOTE — Telephone Encounter (Signed)
Ok to refill??  Last office visit 01/25/2018.  Last refill 02/25/2018.

## 2018-04-08 ENCOUNTER — Other Ambulatory Visit: Payer: Self-pay

## 2018-04-08 ENCOUNTER — Ambulatory Visit (INDEPENDENT_AMBULATORY_CARE_PROVIDER_SITE_OTHER): Payer: Medicare Other | Admitting: Family Medicine

## 2018-04-08 ENCOUNTER — Encounter: Payer: Self-pay | Admitting: Family Medicine

## 2018-04-08 VITALS — BP 132/72 | HR 95 | Ht 67.5 in | Wt 174.0 lb

## 2018-04-08 DIAGNOSIS — M549 Dorsalgia, unspecified: Secondary | ICD-10-CM

## 2018-04-08 DIAGNOSIS — M5136 Other intervertebral disc degeneration, lumbar region: Secondary | ICD-10-CM

## 2018-04-08 DIAGNOSIS — K219 Gastro-esophageal reflux disease without esophagitis: Secondary | ICD-10-CM | POA: Insufficient documentation

## 2018-04-08 DIAGNOSIS — C61 Malignant neoplasm of prostate: Secondary | ICD-10-CM | POA: Diagnosis not present

## 2018-04-08 LAB — URINALYSIS, ROUTINE W REFLEX MICROSCOPIC
Bilirubin Urine: NEGATIVE
Glucose, UA: NEGATIVE
Hgb urine dipstick: NEGATIVE
Ketones, ur: NEGATIVE
Leukocytes,Ua: NEGATIVE
Nitrite: NEGATIVE
Protein, ur: NEGATIVE
Specific Gravity, Urine: 1.02 (ref 1.001–1.03)
pH: 6 (ref 5.0–8.0)

## 2018-04-08 MED ORDER — METHYLPREDNISOLONE 4 MG PO TBPK: ORAL_TABLET | ORAL | 0 refills | Status: DC

## 2018-04-08 MED ORDER — DEXLANSOPRAZOLE 60 MG PO CPDR: 60.0000 mg | DELAYED_RELEASE_CAPSULE | Freq: Every day | ORAL | 1 refills | Status: DC

## 2018-04-08 MED ORDER — TIZANIDINE HCL 4 MG PO TABS: 4.0000 mg | ORAL_TABLET | Freq: Four times a day (QID) | ORAL | 1 refills | Status: DC | PRN

## 2018-04-08 NOTE — Patient Instructions (Signed)
F/u AS PREVIOUS  

## 2018-04-08 NOTE — Progress Notes (Signed)
   Subjective:    Patient ID: Cory Foley, male    DOB: 1950-04-13, 68 y.o.   MRN: [id number]  Patient presents for Back Pain  Pt here with acute on chronic back pain for past 10 days.  Left side back pain Has tried anti-inflammatory, painmeds, topical creams. He was doing some paperwork standing leaning over, afterwards when he stood up had pain.  No burningwith urination, +frequency  No change inbowels No tingling or numbness in feet  He has known DDD spinal stenosis, , history of prostate cancer   Reflux has been worsening taking protonix but stll wakes him after laying down, gets brash taste in mouth, burning sensation, trying to watch his diet   Review Of Systems:  GEN- denies fatigue, fever, weight loss,weakness, recent illness HEENT- denies eye drainage, change in vision, nasal discharge, CVS- denies chest pain, palpitations RESP- denies SOB, cough, wheeze ABD- denies N/V, change in stools, abd pain GU- denies dysuria, hematuria, dribbling, incontinence MSK- + joint pain, muscle aches, injury Neuro- denies headache, dizziness, syncope, seizure activity       Objective:    BP 132/72   Pulse 95   Ht 5' 7.5" (1.715 m)   Wt 174 lb (78.9 kg)   SpO2 96%   BMI 26.85 kg/m  GEN- NAD, alert and oriented x3 CVS- RRR, no murmur RESP-CTAB ABD-NABS,soft,NT,ND MSK- SPine NT, fair ROM spine, TTP Left paraspinals toward buttocks, neg SLR, fair ROM HIPS/KNEES Neuro- normal tone LE, strength fair bilat, DTR in tact, antalgic gait EXT- No edema Pulses- Radial, DP- 2+        Assessment & Plan:      Problem List Items Addressed This Visit      Unprioritized   Back pain - Primary   Relevant Medications   methylPREDNISolone (MEDROL DOSEPAK) 4 MG TBPK tablet   tiZANidine (ZANAFLEX) 4 MG tablet   Other Relevant Orders   Urinalysis, Routine w reflex microscopic (Completed)   DDD (degenerative disc disease), lumbar    More MSK pain With GERD, will give medrol  dosepak Zanaflex muscle relaxer Continue pain meds If not improved in 1 week, repeat MRI lumbar spine Has, DDD, Spinal stenois history and history of prostate cancer, though PSA in June was still undetectable Will recheck PSA today       Relevant Medications   methylPREDNISolone (MEDROL DOSEPAK) 4 MG TBPK tablet   tiZANidine (ZANAFLEX) 4 MG tablet   GERD (gastroesophageal reflux disease)    Trial of dexilant Hold protonix      Relevant Medications   dexlansoprazole (DEXILANT) 60 MG capsule   Other Relevant Orders   Basic metabolic panel (Completed)   Prostate cancer ([place])   Relevant Medications   methylPREDNISolone (MEDROL DOSEPAK) 4 MG TBPK tablet   Other Relevant Orders   Basic metabolic panel (Completed)   PSA (Completed)      Note: This dictation was prepared with Dragon dictation along with smaller phrase technology. Any transcriptional errors that result from this process are unintentional.

## 2018-04-08 NOTE — Assessment & Plan Note (Signed)
More MSK pain With GERD, will give medrol dosepak Zanaflex muscle relaxer Continue pain meds If not improved in 1 week, repeat MRI lumbar spine Has, DDD, Spinal stenois history and history of prostate cancer, though PSA in June was still undetectable Will recheck PSA today

## 2018-04-08 NOTE — Assessment & Plan Note (Signed)
Trial of dexilant Hold protonix

## 2018-04-09 LAB — BASIC METABOLIC PANEL
BUN: 8 mg/dL (ref 7–25)
CO2: 25 mmol/L (ref 20–32)
Calcium: 9.2 mg/dL (ref 8.6–10.3)
Chloride: 107 mmol/L (ref 98–110)
Creat: 0.98 mg/dL (ref 0.70–1.25)
Glucose, Bld: 83 mg/dL (ref 65–99)
Potassium: 4.3 mmol/L (ref 3.5–5.3)
Sodium: 141 mmol/L (ref 135–146)

## 2018-04-09 LAB — PSA: PSA: <0.1 ng/mL (ref <=4.0)

## 2018-04-10 ENCOUNTER — Encounter: Payer: Self-pay | Admitting: Family Medicine

## 2018-04-14 ENCOUNTER — Other Ambulatory Visit: Payer: Self-pay | Admitting: *Deleted

## 2018-04-14 DIAGNOSIS — M5136 Other intervertebral disc degeneration, lumbar region: Secondary | ICD-10-CM

## 2018-04-25 ENCOUNTER — Ambulatory Visit (HOSPITAL_COMMUNITY)
Admission: RE | Admit: 2018-04-25 | Discharge: 2018-04-25 | Disposition: A | Discharge status: Home / Self Care | Payer: Medicare Other | Source: Ambulatory Visit | Attending: Family Medicine | Admitting: Family Medicine

## 2018-04-25 DIAGNOSIS — M5136 Other intervertebral disc degeneration, lumbar region: Secondary | ICD-10-CM

## 2018-04-25 DIAGNOSIS — M545 Low back pain: Secondary | ICD-10-CM | POA: Diagnosis not present

## 2018-04-26 ENCOUNTER — Other Ambulatory Visit: Payer: Self-pay | Admitting: Family Medicine

## 2018-04-27 ENCOUNTER — Other Ambulatory Visit: Payer: Self-pay | Admitting: *Deleted

## 2018-04-27 ENCOUNTER — Encounter: Payer: Self-pay | Admitting: Family Medicine

## 2018-04-27 ENCOUNTER — Other Ambulatory Visit: Payer: Self-pay

## 2018-04-27 ENCOUNTER — Ambulatory Visit (INDEPENDENT_AMBULATORY_CARE_PROVIDER_SITE_OTHER): Payer: Medicare Other | Admitting: Family Medicine

## 2018-04-27 VITALS — BP 126/64 | HR 68 | Temp 98.9°F | Resp 12 | Ht 67.5 in | Wt 174.0 lb

## 2018-04-27 DIAGNOSIS — E7800 Pure hypercholesterolemia, unspecified: Secondary | ICD-10-CM

## 2018-04-27 DIAGNOSIS — J42 Unspecified chronic bronchitis: Secondary | ICD-10-CM | POA: Diagnosis not present

## 2018-04-27 DIAGNOSIS — F5101 Primary insomnia: Secondary | ICD-10-CM

## 2018-04-27 DIAGNOSIS — M810 Age-related osteoporosis without current pathological fracture: Secondary | ICD-10-CM | POA: Diagnosis not present

## 2018-04-27 DIAGNOSIS — G2581 Restless legs syndrome: Secondary | ICD-10-CM

## 2018-04-27 DIAGNOSIS — M5136 Other intervertebral disc degeneration, lumbar region: Secondary | ICD-10-CM

## 2018-04-27 DIAGNOSIS — M5440 Lumbago with sciatica, unspecified side: Secondary | ICD-10-CM

## 2018-04-27 DIAGNOSIS — K219 Gastro-esophageal reflux disease without esophagitis: Secondary | ICD-10-CM

## 2018-04-27 DIAGNOSIS — G8929 Other chronic pain: Secondary | ICD-10-CM

## 2018-04-27 LAB — CBC WITH DIFFERENTIAL/PLATELET
Absolute Monocytes: 371 cells/uL (ref 200–950)
Basophils Absolute: 19 cells/uL (ref 0–200)
Basophils Relative: 0.3 %
Eosinophils Absolute: 58 cells/uL (ref 15–500)
Eosinophils Relative: 0.9 %
HCT: 41.3 % (ref 38.5–50.0)
Hemoglobin: 13.6 g/dL (ref 13.2–17.1)
Lymphs Abs: 1472 cells/uL (ref 850–3900)
MCH: 31.3 pg (ref 27.0–33.0)
MCHC: 32.9 g/dL (ref 32.0–36.0)
MCV: 95.2 fL (ref 80.0–100.0)
MPV: 10.8 fL (ref 7.5–12.5)
Monocytes Relative: 5.8 %
Neutro Abs: 4480 cells/uL (ref 1500–7800)
Neutrophils Relative %: 70 %
Platelets: 191 10*3/uL (ref 140–400)
RBC: 4.34 10*6/uL (ref 4.20–5.80)
RDW: 13.1 % (ref 11.0–15.0)
Total Lymphocyte: 23 %
WBC: 6.4 10*3/uL (ref 3.8–10.8)

## 2018-04-27 LAB — COMPREHENSIVE METABOLIC PANEL
AG Ratio: 2 (calc) (ref 1.0–2.5)
ALT: 8 U/L — ABNORMAL LOW (ref 9–46)
AST: 12 U/L (ref 10–35)
Albumin: 4.3 g/dL (ref 3.6–5.1)
Alkaline phosphatase (APISO): 56 U/L (ref 35–144)
BUN: 8 mg/dL (ref 7–25)
CO2: 29 mmol/L (ref 20–32)
Calcium: 9.5 mg/dL (ref 8.6–10.3)
Chloride: 107 mmol/L (ref 98–110)
Creat: 0.8 mg/dL (ref 0.70–1.25)
Globulin: 2.1 g/dL (calc) (ref 1.9–3.7)
Glucose, Bld: 82 mg/dL (ref 65–99)
Potassium: 4.6 mmol/L (ref 3.5–5.3)
Sodium: 141 mmol/L (ref 135–146)
Total Bilirubin: 0.4 mg/dL (ref 0.2–1.2)
Total Protein: 6.4 g/dL (ref 6.1–8.1)

## 2018-04-27 LAB — LIPID PANEL
Cholesterol: 166 mg/dL (ref <200)
HDL: 43 mg/dL (ref >=40)
LDL Cholesterol (Calc): 100 mg/dL (calc) — ABNORMAL HIGH
Non-HDL Cholesterol (Calc): 123 mg/dL (calc) (ref <130)
Total CHOL/HDL Ratio: 3.9 (calc) (ref <5.0)
Triglycerides: 134 mg/dL (ref <150)

## 2018-04-27 MED ORDER — FAMOTIDINE 20 MG PO TABS: 20.0000 mg | ORAL_TABLET | Freq: Every day | ORAL | 2 refills | Status: DC

## 2018-04-27 MED ORDER — HYDROCODONE-ACETAMINOPHEN 10-325 MG PO TABS: 1.0000 | ORAL_TABLET | ORAL | 0 refills | Status: DC | PRN

## 2018-04-27 NOTE — Progress Notes (Signed)
Subjective:    Patient ID: Cory Foley, male    DOB: 12-Jun-1950, 68 y.o.   MRN: [id number]  Patient presents for Follow-up (is fasting)  Chronic back pain - Had recent MRI, no improvement wit the steroids or the muscle relaxer , taking his hydrocodone   Intrested injections  MRI showed the degenerative disc change as well as some foraminal stenosis but no major spinal stenosis.  He also has disc bulge.    Osteoporosis- bone density done in 2019 he is on Reclast and vitamin D/calcium      GERD- dexilant didn't help, went back to protonix, starts aorund 6pm ,    Hyperlipidemia- due for recheck on lipids    Chronic insomnia- taking trazadone 50mg     RLS- continue don requi[     COPD no difficulty with his breathing has albuterol on hand     Review Of Systems:  GEN- denies fatigue, fever, weight loss,weakness, recent illness HEENT- denies eye drainage, change in vision, nasal discharge, CVS- denies chest pain, palpitations RESP- denies SOB, cough, wheeze ABD- denies N/V, change in stools, abd pain GU- denies dysuria, hematuria, dribbling, incontinence MSK- +joint pain, muscle aches, injury Neuro- denies headache, dizziness, syncope, seizure activity       Objective:    BP 126/64   Pulse 68   Temp 98.9 F (37.2 C) (Oral)   Resp 12   Ht 5' 7.5" (1.715 m)   Wt 174 lb (78.9 kg)   SpO2 96%   BMI 26.85 kg/m  GEN- NAD, alert and oriented x3 HEENT- PERRL, EOMI, non injected sclera, pink conjunctiva, MMM, oropharynx clear Neck- Supple, no thyromegaly CVS- RRR, no murmur RESP-CTAB ABD-NABS,soft,NT,ND EXT- No edema Pulses- Radial, DP- 2+        Assessment & Plan:      Problem List Items Addressed This Visit      Unprioritized   Back pain   Relevant Orders   Ambulatory referral to Orthopedic Surgery   COPD (chronic obstructive pulmonary disease) ([place]) - Primary    No symptoms.  Albuterol as needed      DDD (degenerative disc disease), lumbar    Will  refer to orthopedic spine surgeon.  He would like to try injection see if this helps with pain.  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  He is continued on his hydrocodone.  There was no sign of cancer in the bones he does have prostate cancer which is under surveillance      Relevant Orders   Ambulatory referral to Orthopedic Surgery   GERD (gastroesophageal reflux disease)    Restart Protonix will add Pepcid 20 mg at bedtime if this does not help in the next 4 weeks we will send him to GI for possible EGD      Relevant Medications   famotidine (PEPCID) 20 MG tablet   Hyperlipidemia    He is on statin drug.      Relevant Orders   CBC with Differential/Platelet   Comprehensive metabolic panel   Lipid panel   Insomnia    Continue trazodone      Osteoporosis    He has Reclast infusions also on calcium and vitamin D      RLS (restless legs syndrome)    Doing well on Requip no changes         Note: This dictation was prepared with Dragon dictation along with smaller phrase technology. Any transcriptional errors that result  from this process are unintentional.

## 2018-04-27 NOTE — Assessment & Plan Note (Signed)
Restart Protonix will add Pepcid 20 mg at bedtime if this does not help in the next 4 weeks we will send him to GI for possible EGD

## 2018-04-27 NOTE — Assessment & Plan Note (Signed)
He has Reclast infusions also on calcium and vitamin D

## 2018-04-27 NOTE — Assessment & Plan Note (Signed)
Continue trazodone 

## 2018-04-27 NOTE — Assessment & Plan Note (Signed)
Doing well on Requip no changes

## 2018-04-27 NOTE — Assessment & Plan Note (Signed)
No symptoms.  Albuterol as needed

## 2018-04-27 NOTE — Assessment & Plan Note (Signed)
He is on statin drug.

## 2018-04-27 NOTE — Telephone Encounter (Signed)
Received call from patient.   Requested refill on Hydrocodone/APAP.   Ok to refill??  Last office visit 04/27/2018.  Last refill 03/28/2018.

## 2018-04-27 NOTE — Patient Instructions (Addendum)
Take protonix in the morning Pepcid a bedtime Referral to [facility] spine surgeon We will call with lab results  F/U 4 months Physical

## 2018-04-27 NOTE — Assessment & Plan Note (Signed)
Will refer to orthopedic spine surgeon.  He would like to try injection see if this helps with pain.  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  He is continued on his hydrocodone.  There was no sign of cancer in the bones he does have prostate cancer which is under surveillance

## 2018-05-06 DIAGNOSIS — M545 Low back pain: Secondary | ICD-10-CM | POA: Diagnosis not present

## 2018-05-06 DIAGNOSIS — M546 Pain in thoracic spine: Secondary | ICD-10-CM | POA: Diagnosis not present

## 2018-05-20 ENCOUNTER — Other Ambulatory Visit: Payer: Self-pay | Admitting: Family Medicine

## 2018-05-20 ENCOUNTER — Inpatient Hospital Stay (HOSPITAL_COMMUNITY): Admission: RE | Admit: 2018-05-20 | Payer: Medicare Other | Source: Ambulatory Visit

## 2018-05-23 DIAGNOSIS — M47816 Spondylosis without myelopathy or radiculopathy, lumbar region: Secondary | ICD-10-CM | POA: Diagnosis not present

## 2018-05-30 ENCOUNTER — Other Ambulatory Visit: Payer: Self-pay | Admitting: Family Medicine

## 2018-05-30 NOTE — Telephone Encounter (Signed)
Ok to refill??  Last office visit/ refill 04/27/2018.

## 2018-06-21 ENCOUNTER — Other Ambulatory Visit: Payer: Self-pay | Admitting: Family Medicine

## 2018-06-28 ENCOUNTER — Other Ambulatory Visit: Payer: Self-pay | Admitting: Family Medicine

## 2018-06-29 NOTE — Telephone Encounter (Signed)
Ok to refill??  Last office visit 04/27/2018.  Last refill 05/30/2018.

## 2018-07-11 ENCOUNTER — Other Ambulatory Visit: Payer: Self-pay

## 2018-07-12 DIAGNOSIS — M47816 Spondylosis without myelopathy or radiculopathy, lumbar region: Secondary | ICD-10-CM | POA: Diagnosis not present

## 2018-07-13 ENCOUNTER — Other Ambulatory Visit: Payer: Self-pay

## 2018-07-13 ENCOUNTER — Encounter: Payer: Self-pay | Admitting: Family Medicine

## 2018-07-13 ENCOUNTER — Ambulatory Visit (INDEPENDENT_AMBULATORY_CARE_PROVIDER_SITE_OTHER): Payer: Medicare Other | Admitting: Family Medicine

## 2018-07-13 VITALS — BP 122/66 | HR 74 | Temp 98.4°F | Resp 16 | Ht 67.5 in | Wt 166.0 lb

## 2018-07-13 DIAGNOSIS — F321 Major depressive disorder, single episode, moderate: Secondary | ICD-10-CM

## 2018-07-13 DIAGNOSIS — R634 Abnormal weight loss: Secondary | ICD-10-CM

## 2018-07-13 DIAGNOSIS — R51 Headache: Secondary | ICD-10-CM | POA: Diagnosis not present

## 2018-07-13 DIAGNOSIS — F17200 Nicotine dependence, unspecified, uncomplicated: Secondary | ICD-10-CM

## 2018-07-13 DIAGNOSIS — M79605 Pain in left leg: Secondary | ICD-10-CM | POA: Diagnosis not present

## 2018-07-13 DIAGNOSIS — I739 Peripheral vascular disease, unspecified: Secondary | ICD-10-CM | POA: Diagnosis not present

## 2018-07-13 DIAGNOSIS — R519 Headache, unspecified: Secondary | ICD-10-CM

## 2018-07-13 DIAGNOSIS — F329 Major depressive disorder, single episode, unspecified: Secondary | ICD-10-CM | POA: Insufficient documentation

## 2018-07-13 MED ORDER — MIRTAZAPINE 15 MG PO TABS: 15.0000 mg | ORAL_TABLET | Freq: Every day | ORAL | 2 refills | Status: DC

## 2018-07-13 MED ORDER — SUMATRIPTAN SUCCINATE 50 MG PO TABS: 50.0000 mg | ORAL_TABLET | ORAL | 0 refills | Status: DC | PRN

## 2018-07-13 NOTE — Assessment & Plan Note (Signed)
He knows he needs tobacco cessation

## 2018-07-13 NOTE — Assessment & Plan Note (Signed)
PER ABOVE, start remeron Hold trazodone

## 2018-07-13 NOTE — Assessment & Plan Note (Signed)
Concern for possible recurrent blockage in the left lower extremity with the pain he is having.  This is different from his radicular pain from his back.  He does have a diminished pulse in the foot although the foot is warm to touch.  And get him set up with his vascular surgeon last seen a few years ago.  I think he needs repeat imaging on that leg.  The headaches are concerning but he does not have any neurological deficits.  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  I am going to check sed rate CRP for possible temporal arteritis.  If those do come back elevated would consider treating with prednisone.  I would give him Imitrex today.  He already has pain medication at home.  We will hold on imaging of his brain at this time.  I think that stress is underlying most of this.  He is losing weight due to decreased appetite having to do things for his elderly father as well as his brother.  When I stop the trazodone and put him back on Remeron he was on this a few years ago had good weight gain with this also help with his sleep.

## 2018-07-13 NOTE — Patient Instructions (Addendum)
We will call with lab results Referral to vascular Try the imitrex for headache  Stop the trazodone Start remeron for sleep and appetite take full tablet F/U pending results

## 2018-07-13 NOTE — Progress Notes (Signed)
Subjective:    Patient ID: Cory Foley, male    DOB: December 06, 1950, 68 y.o.   MRN: [id number]  Patient presents for Headache (x2 weeks- frequent HA that occur in afternoon- pressure in temples, base of skull) and Leg Pain (L leg pain- throbbing behind knee and down calf)  Severe headahe startes at temples and move up to top of head, feels like pressure is building up. startes every night at6:30 for past 2-3 weeks, he goes to bed around 10pm because of the pain, around 2am when he wakes up she feels better Has nausea along with the headache , he does get congested, but no facial pain, no allergy symptoms  No change in vision , no dizziness Pain medication hydrocodne helps minimally  Sleeping okay , not eating well, eats little in the afternoons.  He has been under a lot of stress taking care of his father whose health continues to decline.  He is also helping take care of his brother who feels very overwhelmed.  He is taking the trazodone as prescribed which does help with his sleep   Past few weeks he has had severe pain to the back of his left leg.  Has throbibing pain behind left popliteal down to calf, resting or , elevating leg pain goes away Using compression hose.  The same side that he previously had a blockage on.  He has not noted any discoloration to his toes but does not feel his pulse is strong as it was before.  After he walks for about 5 minutes he starts getting the pain which is previous to how his other blockage felt. Had injection in back yesterday, no change in his leg symptoms.  Review Of Systems:  GEN- denies fatigue, fever, weight loss,weakness, recent illness HEENT- denies eye drainage, change in vision, nasal discharge, CVS- denies chest pain, palpitations RESP- denies SOB, cough, wheeze ABD- denies N/V, change in stools, abd pain GU- denies dysuria, hematuria, dribbling, incontinence MSK-+ joint pain,+ muscle aches, injury Neuro- + headache, dizziness, syncope,  seizure activity       Objective:    BP 122/66   Pulse 74   Temp 98.4 F (36.9 C) (Oral)   Resp 16   Ht 5' 7.5" (1.715 m)   Wt 166 lb (75.3 kg)   SpO2 96%   BMI 25.62 kg/m  GEN- NAD, alert and oriented x3 ,weight down 8lbs in last 2 months  HEENT- PERRL, EOMI, non injected sclera, pink conjunctiva, MMM, oropharynx clear, nares clear, no sinus tenderness, Neck- Supple, no thyromegaly, no bruit  CVS- RRR, no murmur RESP-CTAB ABD-NABS,soft,NT,ND NEURO-CNII-XII in tact, no focaldeficits, normal speech, no ataxia  Psych- depressed affect, became very tearful during the exam, not anxious appearing  EXT- trace left ankle edema, no edema of leg/calf bilat Pulses- Radial, DP- 2+ Right side, DP not palpated on left foot, PT diminished         Assessment & Plan:      Problem List Items Addressed This Visit      Unprioritized   MDD (major depressive disorder)    PER ABOVE, start remeron Hold trazodone      Relevant Medications   mirtazapine (REMERON) 15 MG tablet   PVD (peripheral vascular disease) ([place]) - Primary    Concern for possible recurrent blockage in the left lower extremity with the pain he is having.  This is different from his radicular pain from his back.  He does have a diminished pulse in the  foot although the foot is warm to touch.  And get him set up with his vascular surgeon last seen a few years ago.  I think he needs repeat imaging on that leg.  The headaches are concerning but he does not have any neurological deficits.  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  I am going to check sed rate CRP for possible temporal arteritis.  If those do come back elevated would consider treating with prednisone.  I would give him Imitrex today.  He already has pain medication at home.  We will hold on imaging of his brain at this time.  I think that stress is underlying most  of this.  He is losing weight due to decreased appetite having to do things for his elderly father as well as his brother.  When I stop the trazodone and put him back on Remeron he was on this a few years ago had good weight gain with this also help with his sleep.      Relevant Orders   CBC with Differential/Platelet   Comprehensive metabolic panel   Ambulatory referral to Vascular Surgery   TOBACCO ABUSE    He knows he needs tobacco cessation      Weight loss   Relevant Orders   TSH    Other Visit Diagnoses    New onset of headaches       Relevant Medications   cyclobenzaprine (FLEXERIL) 5 MG tablet   mirtazapine (REMERON) 15 MG tablet   SUMAtriptan (IMITREX) 50 MG tablet   Other Relevant Orders   CBC with Differential/Platelet   Comprehensive metabolic panel   Sedimentation Rate   C-reactive protein   Left leg pain       Relevant Orders   Ambulatory referral to Vascular Surgery      Note: This dictation was prepared with Dragon dictation along with smaller phrase technology. Any transcriptional errors that result from this process are unintentional.

## 2018-07-14 LAB — CBC WITH DIFFERENTIAL/PLATELET
Absolute Monocytes: 571 cells/uL (ref 200–950)
Basophils Absolute: 11 cells/uL (ref 0–200)
Basophils Relative: 0.1 %
Eosinophils Absolute: 11 cells/uL — ABNORMAL LOW (ref 15–500)
Eosinophils Relative: 0.1 %
HCT: 40 % (ref 38.5–50.0)
Hemoglobin: 12.9 g/dL — ABNORMAL LOW (ref 13.2–17.1)
Lymphs Abs: 986 cells/uL (ref 850–3900)
MCH: 30.9 pg (ref 27.0–33.0)
MCHC: 32.3 g/dL (ref 32.0–36.0)
MCV: 95.7 fL (ref 80.0–100.0)
MPV: 10.7 fL (ref 7.5–12.5)
Monocytes Relative: 5.1 %
Neutro Abs: 9621 cells/uL — ABNORMAL HIGH (ref 1500–7800)
Neutrophils Relative %: 85.9 %
Platelets: 204 10*3/uL (ref 140–400)
RBC: 4.18 10*6/uL — ABNORMAL LOW (ref 4.20–5.80)
RDW: 12.9 % (ref 11.0–15.0)
Total Lymphocyte: 8.8 %
WBC: 11.2 10*3/uL — ABNORMAL HIGH (ref 3.8–10.8)

## 2018-07-14 LAB — SEDIMENTATION RATE: Sed Rate: 9 mm/h (ref 0–20)

## 2018-07-14 LAB — COMPREHENSIVE METABOLIC PANEL
AG Ratio: 1.8 (calc) (ref 1.0–2.5)
ALT: 8 U/L — ABNORMAL LOW (ref 9–46)
AST: 12 U/L (ref 10–35)
Albumin: 4.3 g/dL (ref 3.6–5.1)
Alkaline phosphatase (APISO): 48 U/L (ref 35–144)
BUN: 11 mg/dL (ref 7–25)
CO2: 23 mmol/L (ref 20–32)
Calcium: 9.8 mg/dL (ref 8.6–10.3)
Chloride: 106 mmol/L (ref 98–110)
Creat: 0.82 mg/dL (ref 0.70–1.25)
Globulin: 2.4 g/dL (calc) (ref 1.9–3.7)
Glucose, Bld: 90 mg/dL (ref 65–99)
Potassium: 4.9 mmol/L (ref 3.5–5.3)
Sodium: 141 mmol/L (ref 135–146)
Total Bilirubin: 0.3 mg/dL (ref 0.2–1.2)
Total Protein: 6.7 g/dL (ref 6.1–8.1)

## 2018-07-14 LAB — TSH: TSH: 0.33 mIU/L — ABNORMAL LOW (ref 0.40–4.50)

## 2018-07-14 LAB — C-REACTIVE PROTEIN: CRP: 2.5 mg/L (ref <8.0)

## 2018-07-20 ENCOUNTER — Telehealth: Payer: Self-pay

## 2018-07-20 ENCOUNTER — Telehealth: Payer: Self-pay | Admitting: Family Medicine

## 2018-07-20 NOTE — Telephone Encounter (Deleted)
Pt's wife called stating that pt is having chest pain (per pt has been going on and off for several months). I asked if his left arm was hurting - his reply was my arms always hurt because of his shoulder - pain any worse then normal - per pt no. Any sob - per pt has COPD so he does not know if he is sob - any worse then normal day - per pt no. Wanted to be seen today - we have no availability left for today and pt was instructed to go to the er for evaluation. Pt refused stating that he did not want to go to the hospital and made an apt for Friday with Dr. Buelah Manis. Pt did verbalize understanding that if it got worse to go to the ER.

## 2018-07-20 NOTE — Telephone Encounter (Signed)
Pt called stating that the sumatriptan for his headaches is not working and would like to know if you would prescribe him something else. Please advise.

## 2018-07-20 NOTE — Telephone Encounter (Signed)
I saw notations about him having chest pain. He would not go to ER This has to be evaluated before anything else is given to him for headache He didn't mention any chest pain at last OV

## 2018-07-21 ENCOUNTER — Other Ambulatory Visit: Payer: Medicare Other

## 2018-07-21 ENCOUNTER — Other Ambulatory Visit: Payer: Self-pay

## 2018-07-21 DIAGNOSIS — I739 Peripheral vascular disease, unspecified: Secondary | ICD-10-CM | POA: Diagnosis not present

## 2018-07-21 DIAGNOSIS — R7989 Other specified abnormal findings of blood chemistry: Secondary | ICD-10-CM | POA: Diagnosis not present

## 2018-07-21 NOTE — Telephone Encounter (Signed)
Left VM for pt 

## 2018-07-22 ENCOUNTER — Telehealth: Payer: Self-pay

## 2018-07-22 ENCOUNTER — Telehealth (HOSPITAL_COMMUNITY): Payer: Self-pay | Admitting: Rehabilitation

## 2018-07-22 ENCOUNTER — Ambulatory Visit: Payer: Medicare Other | Admitting: Family Medicine

## 2018-07-22 LAB — CBC WITH DIFFERENTIAL/PLATELET
Absolute Monocytes: 422 cells/uL (ref 200–950)
Basophils Absolute: 20 cells/uL (ref 0–200)
Basophils Relative: 0.3 %
Eosinophils Absolute: 109 cells/uL (ref 15–500)
Eosinophils Relative: 1.6 %
HCT: 38.6 % (ref 38.5–50.0)
Hemoglobin: 12.9 g/dL — ABNORMAL LOW (ref 13.2–17.1)
Lymphs Abs: 1931 cells/uL (ref 850–3900)
MCH: 32.3 pg (ref 27.0–33.0)
MCHC: 33.4 g/dL (ref 32.0–36.0)
MCV: 96.5 fL (ref 80.0–100.0)
MPV: 10.9 fL (ref 7.5–12.5)
Monocytes Relative: 6.2 %
Neutro Abs: 4318 cells/uL (ref 1500–7800)
Neutrophils Relative %: 63.5 %
Platelets: 170 10*3/uL (ref 140–400)
RBC: 4 10*6/uL — ABNORMAL LOW (ref 4.20–5.80)
RDW: 13.2 % (ref 11.0–15.0)
Total Lymphocyte: 28.4 %
WBC: 6.8 10*3/uL (ref 3.8–10.8)

## 2018-07-22 LAB — T3, FREE: T3, Free: 2.9 pg/mL (ref 2.3–4.2)

## 2018-07-22 LAB — TSH: TSH: 1.12 mIU/L (ref 0.40–4.50)

## 2018-07-22 LAB — T4, FREE: Free T4: 1.1 ng/dL (ref 0.8–1.8)

## 2018-07-22 MED ORDER — TOPIRAMATE 25 MG PO TABS: ORAL_TABLET | ORAL | 1 refills | Status: DC

## 2018-07-22 NOTE — Addendum Note (Signed)
Addended by: Shary Decamp B on: 07/22/2018 11:45 AM   Modules accepted: Level of Service, SmartSet

## 2018-07-22 NOTE — Telephone Encounter (Deleted)
This information was entered on wrong pt. Please disregard message.

## 2018-07-22 NOTE — Telephone Encounter (Signed)

## 2018-07-22 NOTE — Telephone Encounter (Signed)
Spoke with patient.  Note also discussed the ER where he was mixed up with his brother.  He has not had any chest pain or cardiac symptoms.  His headaches did improve some with the Imitrex he had one night over the past week where he had no headache at all.  But other nights even though it would improve it would come back.  Feels like it has not completely dissipated.  Denies any change in vision no nausea vomiting.  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  We will start with a low dose as he has had problems with appetite because of his low mood stress at home.  Discussed that he will have to diligently pay attention to making sure that he is eating the mirtazapine is in background to help with sleep and his weight as well. Continues to as needed Imitrex is needed for severe headache. Hold on imaging at this time  I reviewed his labs more consistent with subclinical hyperthyroidism or possible just mild abnormality in the setting of his stress.  We will recheck at his CPE in 6 weeks before given a diagnosis  CBc look good

## 2018-07-22 NOTE — Telephone Encounter (Signed)
This encounter was created in error - please disregard.

## 2018-07-25 ENCOUNTER — Ambulatory Visit (INDEPENDENT_AMBULATORY_CARE_PROVIDER_SITE_OTHER)
Admission: RE | Admit: 2018-07-25 | Discharge: 2018-07-25 | Disposition: A | Discharge status: Home / Self Care | Payer: Medicare Other | Source: Ambulatory Visit | Attending: Family | Admitting: Family

## 2018-07-25 ENCOUNTER — Ambulatory Visit (HOSPITAL_COMMUNITY)
Admission: RE | Admit: 2018-07-25 | Discharge: 2018-07-25 | Disposition: A | Discharge status: Home / Self Care | Payer: Medicare Other | Source: Ambulatory Visit | Attending: Family | Admitting: Family

## 2018-07-25 ENCOUNTER — Other Ambulatory Visit: Payer: Self-pay

## 2018-07-25 ENCOUNTER — Encounter (HOSPITAL_COMMUNITY): Payer: Self-pay | Admitting: *Deleted

## 2018-07-25 DIAGNOSIS — I739 Peripheral vascular disease, unspecified: Secondary | ICD-10-CM | POA: Insufficient documentation

## 2018-08-01 ENCOUNTER — Other Ambulatory Visit: Payer: Self-pay | Admitting: Family Medicine

## 2018-08-01 ENCOUNTER — Encounter: Payer: Self-pay | Admitting: Surgery

## 2018-08-01 ENCOUNTER — Other Ambulatory Visit: Payer: Self-pay

## 2018-08-01 ENCOUNTER — Ambulatory Visit: Payer: Medicare Other | Admitting: Surgery

## 2018-08-01 ENCOUNTER — Other Ambulatory Visit: Payer: Self-pay | Admitting: *Deleted

## 2018-08-01 ENCOUNTER — Encounter: Payer: Self-pay | Admitting: *Deleted

## 2018-08-01 VITALS — BP 124/82 | HR 66 | Temp 98.1°F | Resp 20 | Ht 67.5 in | Wt 165.5 lb

## 2018-08-01 DIAGNOSIS — I739 Peripheral vascular disease, unspecified: Secondary | ICD-10-CM | POA: Diagnosis not present

## 2018-08-01 NOTE — Progress Notes (Signed)
[facility] [place]  Patient name: Cory Foley MRN: [id number] DOB: 07-May-1950 Sex: male   REQUESTING PROVIDER:    Dr. Buelah Manis   REASON FOR CONSULT:    Leg pain with walking  HISTORY OF PRESENT ILLNESS:   Cory Foley is a 68 y.o. male, who has a history of a left above-knee to below-knee popliteal artery bypass graft with reversed ipsilateral greater saphenous vein in the setting of an ischemic left fifth toe.  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  His wounds have gone on to heal.  His operation was performed on 10/21/2010.  For about a month, he has been having pain in his calf with walking.  He says he can walk approximately 40 feet up in his calf muscle starts to cramp so bad he has to stop.  The symptoms go away after 5 minutes of rest.  Beginning about a week ago he also began having buttock and thigh cramping with walking 40 feet.  He continues to smoke.  He takes a statin for hypercholesterolemia.  He is medically managed for hypertension.  He also does have some lower back issues.  PAST MEDICAL HISTORY    Past Medical History:  Diagnosis Date  . Allergy   . Anxiety   . Arthritis   . BPH with obstruction/lower urinary tract symptoms   . COPD, mild ([place])   . ED (erectile dysfunction)   . GERD (gastroesophageal reflux disease)   . Hyperlipidemia   . Nocturia   . Osteoporosis   . Prostate cancer ([place]) 01/11/2012   Adenocarcinoma, Gleason=4+4=8, & 4+5=9,PSA=17.24, Volume= 15.45cc  . PVD (peripheral vascular disease) ([place])      FAMILY HISTORY   Family History  Problem Relation Age of Onset  . Heart disease Mother        Valve regurgitation and Pacemaker   . Diabetes Mother   . Hyperlipidemia Mother   . Heart disease Father        CABG x 5  . Hyperlipidemia Father   . Hypertension Father   . Heart attack Father   . Heart disease Sister        aortic valve replacement  . Cancer  Sister 55       Colon cancer w/ metastasis  . Hypertension Brother     SOCIAL HISTORY:   Social History   Socioeconomic History  . Marital status: Married    Spouse name: Holley Raring  . Number of children: 2  . Years of education: College  . 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 Financial resource strain: Not on file  . Food insecurity:    Worry: Not on file    Inability: Not on file  . Transportation needs:    Medical: Not on file    Non-medical: Not on file  Tobacco Use  . Smoking status: Current Every Day Smoker    Packs/day: 0.50    Years: 43.00    Pack years: 21.50    Types: Cigarettes  . Smokeless tobacco: Never Used  . Tobacco comment: 1/2 ppd  Substance and Sexual Activity  . Alcohol use: No    Comment: quit 1.5 years ago  . Drug use: No  . Sexual activity: Not Currently  Lifestyle  . Physical activity:    Days per week:  Not on file    Minutes per session: Not on file  . Stress: Not on file  Relationships  . Social connections:    Talks on phone: Not on file    Gets together: Not on file    Attends religious service: Not on file    Active member of club or organization: Not on file    Attends meetings of clubs or organizations: Not on file    Relationship status: Not on file  . Intimate partner violence:    Fear of current or ex partner: Not on file    Emotionally abused: Not on file    Physically abused: Not on file    Forced sexual activity: Not on file  Other Topics Concern  . Not on file  Social History Narrative   Patient lives at home with spouse.   Caffeine use: very little    ALLERGIES:    Allergies  Allergen Reactions  . Codeine Other (See Comments)    Bad headache and sweats  . Morphine And Related Itching    sweats  . Percocet [Oxycodone-Acetaminophen] Other (See Comments)    Headache     CURRENT MEDICATIONS:    Current Outpatient Medications  Medication Sig Dispense Refill  . albuterol (PROVENTIL HFA;VENTOLIN HFA) 108 (90 BASE) MCG/ACT inhaler Inhale 2 puffs into the lungs every 6 (six) hours as needed for wheezing. 1 Inhaler 0  . aspirin 81 MG tablet Take 81 mg by mouth daily.      . calcium carbonate (OS-CAL) 600 MG tablet Take 1 tablet (600 mg total) by mouth 2 (two) times daily with a meal. 30 tablet   . calcium-vitamin D 250-100 MG-UNIT tablet Take 1 tablet by mouth 2 (two) times daily.    . cholecalciferol (VITAMIN D) 1000 units tablet Take 1 tablet (1,000 Units total) by mouth daily.    . cyclobenzaprine (FLEXERIL) 5 MG tablet Take 5 mg by mouth 3 (three) times daily as needed for muscle spasms.    . famotidine (PEPCID) 20 MG tablet TAKE 1 TABLET BY MOUTH AT BEDTIME. 30 tablet 0  . HYDROcodone-acetaminophen (NORCO) 10-325 MG tablet TAKE 1 TABLET BY MOUTH EVERY FOUR HOURS AS NEEDED. 120 tablet 0  . methocarbamol (ROBAXIN) 500 MG tablet Take 500 mg by mouth 4 (four) times daily.    . mirtazapine (REMERON) 15 MG tablet Take 1 tablet (15 mg total) by mouth at bedtime. 30 tablet 2  . pantoprazole (PROTONIX) 40 MG tablet TAKE ONE TABLET BY MOUTH DAILY. 90 tablet 3  . rOPINIRole (REQUIP) 3 MG tablet TAKE (1) TABLET BY MOUTH AT BEDTIME. 30 tablet 2  . simvastatin (ZOCOR) 40 MG tablet TAKE (1) TABLET BY MOUTH AT BEDTIME. 90 tablet 0  . tiZANidine (ZANAFLEX) 4 MG tablet Take 1 tablet (4 mg total) by mouth every 6 (six) hours as needed for muscle spasms. 30 tablet 1  . topiramate (TOPAMAX) 25 MG tablet Take 1-2 tablets at bedtime for migraine 60 tablet 1   No current facility-administered medications for this visit.     REVIEW OF SYSTEMS:   [X]  denotes positive finding, [ ]  denotes negative finding Cardiac  Comments:  Chest pain or chest pressure:    Shortness of breath upon exertion:    Short of breath when lying flat:    Irregular heart rhythm:        Vascular     Pain in calf, thigh, or hip brought on by ambulation: x   Pain in feet at  night that wakes you up from your sleep:     Blood clot in your veins:    Leg swelling:         Pulmonary    Oxygen at home:    Productive cough:     Wheezing:         Neurologic    Sudden weakness in arms or legs:     Sudden numbness in arms or legs:     Sudden onset of difficulty speaking or slurred speech:    Temporary loss of vision in one eye:     Problems with dizziness:         Gastrointestinal    Blood in stool:      Vomited blood:         Genitourinary    Burning when urinating:     Blood in urine:        Psychiatric    Major depression:         Hematologic    Bleeding problems:    Problems with blood clotting too easily:        Skin    Rashes or ulcers:        Constitutional    Fever or chills:     PHYSICAL EXAM:   Vitals:   08/01/18 1230  BP: 124/82  Pulse: 66  Resp: 20  Temp: 98.1 F (36.7 C)  TempSrc: Oral  SpO2: 98%  Weight: 75.1 kg  Height: 5' 7.5" (1.715 m)    GENERAL: The patient is a well-nourished male, in no acute distress. The vital signs are documented above. CARDIAC: There is a regular rate and rhythm.  VASCULAR: I could not palpate pedal pulses PULMONARY: Nonlabored respirations ABDOMEN: Soft and non-tender with normal pitched bowel sounds.  MUSCULOSKELETAL: There are no major deformities or cyanosis. NEUROLOGIC: No focal weakness or paresthesias are detected. SKIN: There are no ulcers or rashes noted. PSYCHIATRIC: The patient has a normal affect.  STUDIES:   I have reviewed the following studies: +-------+-----------+-----------+------------+------------+ Right  1.11       0.59       1.09                     +-------+-----------+-----------+------------+------------+ Left   0.84       0.39       1.08                     +-------+-----------+-----------+------------+------------+  Left: No obvious focal stenosis identified. Patent  above knee to below knee popliteal artery bypass graft. Waveforms suggest iliac artery disease. An attempt was made to duplex the iliac artery system, however no focal stenosis was identified.  Left Graft(s): Above knee to below knee popliteal art bypass graft. bypass graft patent with no evidence of stenosis noted.  ASSESSMENT and PLAN   The exact etiology of his leg pain is certain.  It could either be vasculature or from his back.  Clinically, this certainly sounds like vascular disease.  In addition, his ultrasound showed abnormal waveforms in the groin despite not being able to demonstrate iliac stenosis.  I think the best neck step is to proceed with angiography to pay particular attention to the iliac vessels and intervene as necessary.  This will be through a right femoral approach.  I did discuss the risk of embolization and bleeding.  This is been scheduled for June 23   Annamarie Major, Dorothy Puffer, MD, FACS [facility]  Tel [phone] Pager [phone]

## 2018-08-01 NOTE — Telephone Encounter (Signed)
Ok to refill??  Last office visit 07/13/2018.  Last refill 06/29/2018.

## 2018-08-01 NOTE — H&P (View-Only) (Signed)
[facility] Specialist of [place]  Patient name: Cory Foley MRN: [id number] DOB: 20-Aug-1950 Sex: male   REQUESTING PROVIDER:    Dr. Buelah Manis   REASON FOR CONSULT:    Leg pain with walking  HISTORY OF PRESENT ILLNESS:   Cory Foley is a 68 y.o. male, who has a history of a left above-knee to below-knee popliteal artery bypass graft with reversed ipsilateral greater saphenous vein in the setting of an ischemic left fifth toe.  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  His wounds have gone on to heal.  His operation was performed on 10/21/2010.  For about a month, he has been having pain in his calf with walking.  He says he can walk approximately 40 feet up in his calf muscle starts to cramp so bad he has to stop.  The symptoms go away after 5 minutes of rest.  Beginning about a week ago he also began having buttock and thigh cramping with walking 40 feet.  He continues to smoke.  He takes a statin for hypercholesterolemia.  He is medically managed for hypertension.  He also does have some lower back issues.  PAST MEDICAL HISTORY    Past Medical History:  Diagnosis Date  . Allergy   . Anxiety   . Arthritis   . BPH with obstruction/lower urinary tract symptoms   . COPD, mild ([place])   . ED (erectile dysfunction)   . GERD (gastroesophageal reflux disease)   . Hyperlipidemia   . Nocturia   . Osteoporosis   . Prostate cancer ([place]) 01/11/2012   Adenocarcinoma, Gleason=4+4=8, & 4+5=9,PSA=17.24, Volume= 15.45cc  . PVD (peripheral vascular disease) ([place])      FAMILY HISTORY   Family History  Problem Relation Age of Onset  . Heart disease Mother        Valve regurgitation and Pacemaker   . Diabetes Mother   . Hyperlipidemia Mother   . Heart disease Father        CABG x 5  . Hyperlipidemia Father   . Hypertension Father   . Heart attack Father   . Heart disease Sister        aortic valve replacement  . Cancer  Sister 74       Colon cancer w/ metastasis  . Hypertension Brother     SOCIAL HISTORY:   Social History   Socioeconomic History  . Marital status: Married    Spouse name: Holley Raring  . Number of children: 2  . Years of education: College  . 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 Financial resource strain: Not on file  . Food insecurity:    Worry: Not on file    Inability: Not on file  . Transportation needs:    Medical: Not on file    Non-medical: Not on file  Tobacco Use  . Smoking status: Current Every Day Smoker    Packs/day: 0.50    Years: 43.00    Pack years: 21.50    Types: Cigarettes  . Smokeless tobacco: Never Used  . Tobacco comment: 1/2 ppd  Substance and Sexual Activity  . Alcohol use: No    Comment: quit 1.5 years ago  . Drug use: No  . Sexual activity: Not Currently  Lifestyle  . Physical activity:    Days per week:  Not on file    Minutes per session: Not on file  . Stress: Not on file  Relationships  . Social connections:    Talks on phone: Not on file    Gets together: Not on file    Attends religious service: Not on file    Active member of club or organization: Not on file    Attends meetings of clubs or organizations: Not on file    Relationship status: Not on file  . Intimate partner violence:    Fear of current or ex partner: Not on file    Emotionally abused: Not on file    Physically abused: Not on file    Forced sexual activity: Not on file  Other Topics Concern  . Not on file  Social History Narrative   Patient lives at home with spouse.   Caffeine use: very little    ALLERGIES:    Allergies  Allergen Reactions  . Codeine Other (See Comments)    Bad headache and sweats  . Morphine And Related Itching    sweats  . Percocet [Oxycodone-Acetaminophen] Other (See Comments)    Headache     CURRENT MEDICATIONS:    Current Outpatient Medications  Medication Sig Dispense Refill  . albuterol (PROVENTIL HFA;VENTOLIN HFA) 108 (90 BASE) MCG/ACT inhaler Inhale 2 puffs into the lungs every 6 (six) hours as needed for wheezing. 1 Inhaler 0  . aspirin 81 MG tablet Take 81 mg by mouth daily.      . calcium carbonate (OS-CAL) 600 MG tablet Take 1 tablet (600 mg total) by mouth 2 (two) times daily with a meal. 30 tablet   . calcium-vitamin D 250-100 MG-UNIT tablet Take 1 tablet by mouth 2 (two) times daily.    . cholecalciferol (VITAMIN D) 1000 units tablet Take 1 tablet (1,000 Units total) by mouth daily.    . cyclobenzaprine (FLEXERIL) 5 MG tablet Take 5 mg by mouth 3 (three) times daily as needed for muscle spasms.    . famotidine (PEPCID) 20 MG tablet TAKE 1 TABLET BY MOUTH AT BEDTIME. 30 tablet 0  . HYDROcodone-acetaminophen (NORCO) 10-325 MG tablet TAKE 1 TABLET BY MOUTH EVERY FOUR HOURS AS NEEDED. 120 tablet 0  . methocarbamol (ROBAXIN) 500 MG tablet Take 500 mg by mouth 4 (four) times daily.    . mirtazapine (REMERON) 15 MG tablet Take 1 tablet (15 mg total) by mouth at bedtime. 30 tablet 2  . pantoprazole (PROTONIX) 40 MG tablet TAKE ONE TABLET BY MOUTH DAILY. 90 tablet 3  . rOPINIRole (REQUIP) 3 MG tablet TAKE (1) TABLET BY MOUTH AT BEDTIME. 30 tablet 2  . simvastatin (ZOCOR) 40 MG tablet TAKE (1) TABLET BY MOUTH AT BEDTIME. 90 tablet 0  . tiZANidine (ZANAFLEX) 4 MG tablet Take 1 tablet (4 mg total) by mouth every 6 (six) hours as needed for muscle spasms. 30 tablet 1  . topiramate (TOPAMAX) 25 MG tablet Take 1-2 tablets at bedtime for migraine 60 tablet 1   No current facility-administered medications for this visit.     REVIEW OF SYSTEMS:   [X]  denotes positive finding, [ ]  denotes negative finding Cardiac  Comments:  Chest pain or chest pressure:    Shortness of breath upon exertion:    Short of breath when lying flat:    Irregular heart rhythm:        Vascular     Pain in calf, thigh, or hip brought on by ambulation: x   Pain in feet at  night that wakes you up from your sleep:     Blood clot in your veins:    Leg swelling:         Pulmonary    Oxygen at home:    Productive cough:     Wheezing:         Neurologic    Sudden weakness in arms or legs:     Sudden numbness in arms or legs:     Sudden onset of difficulty speaking or slurred speech:    Temporary loss of vision in one eye:     Problems with dizziness:         Gastrointestinal    Blood in stool:      Vomited blood:         Genitourinary    Burning when urinating:     Blood in urine:        Psychiatric    Major depression:         Hematologic    Bleeding problems:    Problems with blood clotting too easily:        Skin    Rashes or ulcers:        Constitutional    Fever or chills:     PHYSICAL EXAM:   Vitals:   08/01/18 1230  BP: 124/82  Pulse: 66  Resp: 20  Temp: 98.1 F (36.7 C)  TempSrc: Oral  SpO2: 98%  Weight: 75.1 kg  Height: 5' 7.5" (1.715 m)    GENERAL: The patient is a well-nourished male, in no acute distress. The vital signs are documented above. CARDIAC: There is a regular rate and rhythm.  VASCULAR: I could not palpate pedal pulses PULMONARY: Nonlabored respirations ABDOMEN: Soft and non-tender with normal pitched bowel sounds.  MUSCULOSKELETAL: There are no major deformities or cyanosis. NEUROLOGIC: No focal weakness or paresthesias are detected. SKIN: There are no ulcers or rashes noted. PSYCHIATRIC: The patient has a normal affect.  STUDIES:   I have reviewed the following studies: +-------+-----------+-----------+------------+------------+ Right  1.11       0.59       1.09                     +-------+-----------+-----------+------------+------------+ Left   0.84       0.39       1.08                     +-------+-----------+-----------+------------+------------+  Left: No obvious focal stenosis identified. Patent  above knee to below knee popliteal artery bypass graft. Waveforms suggest iliac artery disease. An attempt was made to duplex the iliac artery system, however no focal stenosis was identified.  Left Graft(s): Above knee to below knee popliteal art bypass graft. bypass graft patent with no evidence of stenosis noted.  ASSESSMENT and PLAN   The exact etiology of his leg pain is certain.  It could either be vasculature or from his back.  Clinically, this certainly sounds like vascular disease.  In addition, his ultrasound showed abnormal waveforms in the groin despite not being able to demonstrate iliac stenosis.  I think the best neck step is to proceed with angiography to pay particular attention to the iliac vessels and intervene as necessary.  This will be through a right femoral approach.  I did discuss the risk of embolization and bleeding.  This is been scheduled for June 23   Annamarie Major, Dorothy Puffer, MD, FACS [facility]  Tel [phone] Pager [phone]

## 2018-08-02 ENCOUNTER — Telehealth: Payer: Self-pay | Admitting: *Deleted

## 2018-08-02 NOTE — Telephone Encounter (Signed)
Received call from patient. [phone]~ telephone.   Reports that he has lower extremity angiogram scheduled for 08/16/2018. States that Reclast infusion has been pushed out to 09/06/2018. Inquired as to if that was enough time in between procedures, or should he re-schedule infusion.   MD please advise.

## 2018-08-02 NOTE — Telephone Encounter (Signed)
Call placed to patient and patient made aware.  

## 2018-08-02 NOTE — Telephone Encounter (Signed)
He is fine to have the reclast done If he wants to, okay to push reclast to September

## 2018-08-08 DIAGNOSIS — M47816 Spondylosis without myelopathy or radiculopathy, lumbar region: Secondary | ICD-10-CM | POA: Diagnosis not present

## 2018-08-12 ENCOUNTER — Other Ambulatory Visit: Payer: Self-pay

## 2018-08-12 ENCOUNTER — Other Ambulatory Visit (HOSPITAL_COMMUNITY)
Admission: RE | Admit: 2018-08-12 | Discharge: 2018-08-12 | Disposition: A | Discharge status: Home / Self Care | Payer: Medicare Other | Source: Ambulatory Visit | Attending: Surgery | Admitting: Surgery

## 2018-08-12 DIAGNOSIS — Z1159 Encounter for screening for other viral diseases: Secondary | ICD-10-CM | POA: Insufficient documentation

## 2018-08-14 LAB — NOVEL CORONAVIRUS, NAA (HOSP ORDER, SEND-OUT TO REF LAB; TAT 18-24 HRS): SARS-CoV-2, NAA: NOT DETECTED

## 2018-08-16 ENCOUNTER — Other Ambulatory Visit: Payer: Self-pay | Admitting: Family Medicine

## 2018-08-16 ENCOUNTER — Ambulatory Visit (HOSPITAL_COMMUNITY)
Admission: RE | Admit: 2018-08-16 | Discharge: 2018-08-16 | Disposition: A | Discharge status: Home / Self Care | Payer: Medicare Other | Source: Ambulatory Visit | Attending: Surgery | Admitting: Surgery

## 2018-08-16 ENCOUNTER — Other Ambulatory Visit: Payer: Self-pay

## 2018-08-16 ENCOUNTER — Encounter (HOSPITAL_COMMUNITY): Payer: Self-pay | Admitting: Surgery

## 2018-08-16 ENCOUNTER — Encounter (HOSPITAL_COMMUNITY)
Admission: RE | Disposition: A | Discharge status: Home / Self Care | Payer: Self-pay | Source: Ambulatory Visit | Attending: Surgery

## 2018-08-16 DIAGNOSIS — F17210 Nicotine dependence, cigarettes, uncomplicated: Secondary | ICD-10-CM | POA: Diagnosis not present

## 2018-08-16 DIAGNOSIS — N401 Enlarged prostate with lower urinary tract symptoms: Secondary | ICD-10-CM | POA: Insufficient documentation

## 2018-08-16 DIAGNOSIS — E785 Hyperlipidemia, unspecified: Secondary | ICD-10-CM | POA: Insufficient documentation

## 2018-08-16 DIAGNOSIS — Z79899 Other long term (current) drug therapy: Secondary | ICD-10-CM | POA: Diagnosis not present

## 2018-08-16 DIAGNOSIS — I10 Essential (primary) hypertension: Secondary | ICD-10-CM | POA: Insufficient documentation

## 2018-08-16 DIAGNOSIS — I70212 Atherosclerosis of native arteries of extremities with intermittent claudication, left leg: Secondary | ICD-10-CM | POA: Insufficient documentation

## 2018-08-16 DIAGNOSIS — Z8249 Family history of ischemic heart disease and other diseases of the circulatory system: Secondary | ICD-10-CM | POA: Insufficient documentation

## 2018-08-16 DIAGNOSIS — Z7982 Long term (current) use of aspirin: Secondary | ICD-10-CM | POA: Diagnosis not present

## 2018-08-16 DIAGNOSIS — K219 Gastro-esophageal reflux disease without esophagitis: Secondary | ICD-10-CM | POA: Diagnosis not present

## 2018-08-16 DIAGNOSIS — J449 Chronic obstructive pulmonary disease, unspecified: Secondary | ICD-10-CM | POA: Insufficient documentation

## 2018-08-16 DIAGNOSIS — R351 Nocturia: Secondary | ICD-10-CM | POA: Diagnosis not present

## 2018-08-16 DIAGNOSIS — M1990 Unspecified osteoarthritis, unspecified site: Secondary | ICD-10-CM | POA: Diagnosis not present

## 2018-08-16 DIAGNOSIS — Z885 Allergy status to narcotic agent status: Secondary | ICD-10-CM | POA: Insufficient documentation

## 2018-08-16 HISTORY — PX: ABDOMINAL AORTOGRAM W/LOWER EXTREMITY: CATH118223

## 2018-08-16 HISTORY — PX: PERIPHERAL VASCULAR INTERVENTION: CATH118257

## 2018-08-16 LAB — POCT I-STAT, CHEM 8
BUN: 9 mg/dL (ref 8–23)
Calcium, Ion: 1.19 mmol/L (ref 1.15–1.40)
Chloride: 111 mmol/L (ref 98–111)
Creatinine, Ser: 0.9 mg/dL (ref 0.61–1.24)
Glucose, Bld: 97 mg/dL (ref 70–99)
HCT: 40 % (ref 39.0–52.0)
Hemoglobin: 13.6 g/dL (ref 13.0–17.0)
Potassium: 4.6 mmol/L (ref 3.5–5.1)
Sodium: 141 mmol/L (ref 135–145)
TCO2: 24 mmol/L (ref 22–32)

## 2018-08-16 SURGERY — ABDOMINAL AORTOGRAM W/LOWER EXTREMITY
Anesthesia: LOCAL

## 2018-08-16 MED ORDER — HEPARIN SODIUM (PORCINE) 1000 UNIT/ML IJ SOLN
INTRAMUSCULAR | Status: AC
  Filled 2018-08-16: qty 1

## 2018-08-16 MED ORDER — CLOPIDOGREL BISULFATE 75 MG PO TABS
ORAL_TABLET | ORAL | Status: AC
  Filled 2018-08-16: qty 1

## 2018-08-16 MED ORDER — HYDRALAZINE HCL 20 MG/ML IJ SOLN: 5.0000 mg | INTRAMUSCULAR | Status: DC | PRN

## 2018-08-16 MED ORDER — FENTANYL CITRATE (PF) 100 MCG/2ML IJ SOLN
INTRAMUSCULAR | Status: AC
  Filled 2018-08-16: qty 2

## 2018-08-16 MED ORDER — SODIUM CHLORIDE 0.9% FLUSH: 3.0000 mL | INTRAVENOUS | Status: DC | PRN

## 2018-08-16 MED ORDER — HEPARIN (PORCINE) IN NACL 1000-0.9 UT/500ML-% IV SOLN
INTRAVENOUS | Status: DC | PRN
  Administered 2018-08-16: 500 mL

## 2018-08-16 MED ORDER — HEPARIN SODIUM (PORCINE) 1000 UNIT/ML IJ SOLN
INTRAMUSCULAR | Status: DC | PRN
  Administered 2018-08-16: 7000 [IU] via INTRAVENOUS

## 2018-08-16 MED ORDER — HEPARIN (PORCINE) IN NACL 1000-0.9 UT/500ML-% IV SOLN
INTRAVENOUS | Status: AC
  Filled 2018-08-16: qty 1000

## 2018-08-16 MED ORDER — LIDOCAINE HCL (PF) 1 % IJ SOLN
INTRAMUSCULAR | Status: AC
  Filled 2018-08-16: qty 30

## 2018-08-16 MED ORDER — CLOPIDOGREL BISULFATE 75 MG PO TABS: 75.0000 mg | ORAL_TABLET | Freq: Every day | ORAL | 11 refills | Status: DC

## 2018-08-16 MED ORDER — SODIUM CHLORIDE 0.9 % WEIGHT BASED INFUSION: 1.0000 mL/kg/h | INTRAVENOUS | Status: DC

## 2018-08-16 MED ORDER — HYDROCODONE-ACETAMINOPHEN 5-325 MG PO TABS: 2.0000 | ORAL_TABLET | ORAL | Status: DC | PRN

## 2018-08-16 MED ORDER — LABETALOL HCL 5 MG/ML IV SOLN: 10.0000 mg | INTRAVENOUS | Status: DC | PRN

## 2018-08-16 MED ORDER — CLOPIDOGREL BISULFATE 75 MG PO TABS: 75.0000 mg | ORAL_TABLET | Freq: Every day | ORAL | Status: DC

## 2018-08-16 MED ORDER — MIDAZOLAM HCL 2 MG/2ML IJ SOLN
INTRAMUSCULAR | Status: DC | PRN
  Administered 2018-08-16: 2 mg via INTRAVENOUS

## 2018-08-16 MED ORDER — CLOPIDOGREL BISULFATE 300 MG PO TABS
ORAL_TABLET | ORAL | Status: DC | PRN
  Administered 2018-08-16: 75 mg via ORAL

## 2018-08-16 MED ORDER — ASPIRIN 81 MG PO CHEW
CHEWABLE_TABLET | ORAL | Status: DC | PRN
  Administered 2018-08-16: 81 mg via ORAL

## 2018-08-16 MED ORDER — ASPIRIN EC 81 MG PO TBEC: 81.0000 mg | DELAYED_RELEASE_TABLET | Freq: Every day | ORAL | Status: DC

## 2018-08-16 MED ORDER — SODIUM CHLORIDE 0.9 % IV SOLN
INTRAVENOUS | Status: DC
  Administered 2018-08-16: 07:00:00 via INTRAVENOUS

## 2018-08-16 MED ORDER — FENTANYL CITRATE (PF) 100 MCG/2ML IJ SOLN
INTRAMUSCULAR | Status: DC | PRN
  Administered 2018-08-16: 50 ug via INTRAVENOUS
  Administered 2018-08-16: 25 ug via INTRAVENOUS

## 2018-08-16 MED ORDER — ONDANSETRON HCL 4 MG/2ML IJ SOLN: 4.0000 mg | Freq: Four times a day (QID) | INTRAMUSCULAR | Status: DC | PRN

## 2018-08-16 MED ORDER — SODIUM CHLORIDE 0.9% FLUSH: 3.0000 mL | Freq: Two times a day (BID) | INTRAVENOUS | Status: DC

## 2018-08-16 MED ORDER — SODIUM CHLORIDE 0.9 % IV SOLN: 250.0000 mL | INTRAVENOUS | Status: DC | PRN

## 2018-08-16 MED ORDER — IODIXANOL 320 MG/ML IV SOLN
INTRAVENOUS | Status: DC | PRN
  Administered 2018-08-16: 165 mL via INTRA_ARTERIAL

## 2018-08-16 MED ORDER — LIDOCAINE HCL (PF) 1 % IJ SOLN
INTRAMUSCULAR | Status: DC | PRN
  Administered 2018-08-16: 15 mL

## 2018-08-16 MED ORDER — MIDAZOLAM HCL 2 MG/2ML IJ SOLN
INTRAMUSCULAR | Status: AC
  Filled 2018-08-16: qty 2

## 2018-08-16 MED ORDER — ASPIRIN 81 MG PO CHEW
CHEWABLE_TABLET | ORAL | Status: AC
  Filled 2018-08-16: qty 1

## 2018-08-16 SURGICAL SUPPLY — 19 items
CATH OMNI FLUSH 5F 65CM (CATHETERS) ×1 IMPLANT
CLOSURE MYNX CONTROL 6F/7F (Vascular Products) ×1 IMPLANT
DEVICE TORQUE .025-.038 (MISCELLANEOUS) ×1 IMPLANT
DRAPE ZERO GRAVITY STERILE (DRAPES) ×1 IMPLANT
GUIDEWIRE ANGLED .035X150CM (WIRE) ×1 IMPLANT
KIT ENCORE 26 ADVANTAGE (KITS) ×1 IMPLANT
KIT MICROPUNCTURE NIT STIFF (SHEATH) ×1 IMPLANT
KIT PV (KITS) ×3 IMPLANT
SHEATH FLEXOR ANSEL 1 7F 45CM (SHEATH) ×1 IMPLANT
SHEATH PINNACLE 5F 10CM (SHEATH) ×1 IMPLANT
SHEATH PINNACLE 7F 10CM (SHEATH) ×1 IMPLANT
SHEATH PROBE COVER 6X72 (BAG) ×1 IMPLANT
STENT EXPRESS LD 9X25X75 (Permanent Stent) ×1 IMPLANT
SYR MEDRAD MARK V 150ML (SYRINGE) ×1 IMPLANT
TRANSDUCER W/STOPCOCK (MISCELLANEOUS) ×3 IMPLANT
TRAY PV CATH (CUSTOM PROCEDURE TRAY) ×3 IMPLANT
WIRE BENTSON .035X145CM (WIRE) ×1 IMPLANT
WIRE ROSEN-J .035X260CM (WIRE) ×1 IMPLANT
WIRE TORQFLEX AUST .018X40CM (WIRE) ×1 IMPLANT

## 2018-08-16 NOTE — Op Note (Signed)
Patient name: Cory Foley MRN: [id number] DOB: 1950-10-23 Sex: male  08/16/2018 Pre-operative Diagnosis: Left leg and buttock claudication Post-operative diagnosis:  Same Surgeon:  Annamarie Major Procedure Performed:  1.  Ultrasound-guided access, right femoral artery  2.  Abdominal aortogram  3.  Bilateral lower extremity runoff  4.  Stent, left common iliac artery (9 x 25)  5.  Closure device (Mynx)  6.  Conscious sedation (46 minutes)    Indications: Patient has a history of a above-knee to below-knee popliteal bypass graft in the remote past.  He has new onset left buttock and leg claudication.  Ultrasound identified a possible iliac lesion he is here for further evaluation.  Procedure:  The patient was identified in the holding area and taken to room 8.  The patient was then placed supine on the table and prepped and draped in the usual sterile fashion.  A time out was called.  Conscious sedation was administered with the use of IV fentanyl and Versed under continuous physician and nurse monitoring.  Heart rate, blood pressure, and oxygen saturation were continuously monitored.  Total sedation time was 46 minutes.  Ultrasound was used to evaluate the right common femoral artery.  It was patent .  A digital ultrasound image was acquired.  A micropuncture needle was used to access the right common femoral artery under ultrasound guidance.  An 018 wire was advanced without resistance and a micropuncture sheath was placed.  The 018 wire was removed and a benson wire was placed.  The micropuncture sheath was exchanged for a 5 french sheath.  An omniflush catheter was advanced over the wire to the level of L-1.  An abdominal angiogram was obtained.  The catheter was pulled down to the aortic bifurcation additional pelvic imaging and bilateral runoff was performed. Findings:   Aortogram: No significant renal artery stenosis.  The infrarenal abdominal is widely patent.  The right common  external and internal iliac artery are widely patent.  The left common iliac artery is patent however distally just above the hypogastric origin there is a 95% stenosis.  The external iliac arteries widely patent.  Right Lower Extremity: Right common profunda femoral and superficial femoral artery widely patent.  Popliteal arteries widely patent.  There is three-vessel runoff  Left Lower Extremity: The left common femoral profundofemoral artery widely patent.  Superficial femoral artery is patent down to the adductor canal.  There is an above-knee to below-knee popliteal bypass graft which is widely patent.  There is two-vessel runoff via the anterior tibial and peroneal artery  Intervention: After the above images were acquired, decision made to proceed with intervention.  Using a Omni Flush catheter, Bentson wire, and Rosen wire, the wire was placed into the left superficial femoral artery.  A 7 x 45 cm Ansell 1 sheath was then inserted over the bifurcation.  Patient was fully heparinized.  Additional imaging was obtained to identify the best view for the lesion.  I then elected to primarily stent this area.  A express LD 9 x 25 balloon expandable stent was deployed across the lesion.  Follow-up imaging revealed no residual stenosis.  Next the long sheath was exchanged out for short 7 French sheath and a minx device was used for closure.  There were no immediate complications.  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 Annamarie Major, M.D., [facility] [place] Office: [phone] Pager:  [phone]

## 2018-08-16 NOTE — Interval H&P Note (Signed)
History and Physical Interval Note:  08/16/2018 7:38 AM  Cory Foley  has presented today for surgery, with the diagnosis of pvd.  The various methods of treatment have been discussed with the patient and family. After consideration of risks, benefits and other options for treatment, the patient has consented to  Procedure(s): LOWER EXTREMITY ANGIOGRAPHY (N/A) as a surgical intervention.  The patient's history has been reviewed, patient examined, no change in status, stable for surgery.  I have reviewed the patient's chart and labs.  Questions were answered to the patient's satisfaction.     Annamarie Major

## 2018-08-16 NOTE — Discharge Instructions (Signed)
Femoral Site Care °This sheet gives you information about how to care for yourself after your procedure. Your health care provider may also give you more specific instructions. If you have problems or questions, contact your health care provider. °What can I expect after the procedure? °After the procedure, it is common to have: °· Bruising that usually fades within 1-2 weeks. °· Tenderness at the site. °Follow these instructions at home: °Wound care °· Follow instructions from your health care provider about how to take care of your insertion site. Make sure you: °? Wash your hands with soap and water before you change your bandage (dressing). If soap and water are not available, use hand sanitizer. °? Change your dressing as told by your health care provider. °? Leave stitches (sutures), skin glue, or adhesive strips in place. These skin closures may need to stay in place for 2 weeks or longer. If adhesive strip edges start to loosen and curl up, you may trim the loose edges. Do not remove adhesive strips completely unless your health care provider tells you to do that. °· Do not take baths, swim, or use a hot tub until your health care provider approves. °· You may shower 24-48 hours after the procedure or as told by your health care provider. °? Gently wash the site with plain soap and water. °? Pat the area dry with a clean towel. °? Do not rub the site. This may cause bleeding. °· Do not apply powder or lotion to the site. Keep the site clean and dry. °· Check your femoral site every day for signs of infection. Check for: °? Redness, swelling, or pain. °? Fluid or blood. °? Warmth. °? Pus or a bad smell. °Activity °· For the first 2-3 days after your procedure, or as long as directed: °? Avoid climbing stairs as much as possible. °? Do not squat. °· Do not lift anything that is heavier than 10 lb (4.5 kg), or the limit that you are told, until your health care provider says that it is safe. °· Rest as  directed. °? Avoid sitting for a long time without moving. Get up to take short walks every 1-2 hours. °· Do not drive for 24 hours if you were given a medicine to help you relax (sedative). °General instructions °· Take over-the-counter and prescription medicines only as told by your health care provider. °· Keep all follow-up visits as told by your health care provider. This is important. °Contact a health care provider if you have: °· A fever or chills. °· You have redness, swelling, or pain around your insertion site. °Get help right away if: °· The catheter insertion area swells very fast. °· You pass out. °· You suddenly start to sweat or your skin gets clammy. °· The catheter insertion area is bleeding, and the bleeding does not stop when you hold steady pressure on the area. °· The area near or just beyond the catheter insertion site becomes pale, cool, tingly, or numb. °These symptoms may represent a serious problem that is an emergency. Do not wait to see if the symptoms will go away. Get medical help right away. Call your local emergency services (911 in the [place] Do not drive yourself to the hospital. °Summary °· After the procedure, it is common to have bruising that usually fades within 1-2 weeks. °· Check your femoral site every day for signs of infection. °· Do not lift anything that is heavier than 10 lb (4.5 kg), or the   limit that you are told, until your health care provider says that it is safe. °This information is not intended to replace advice given to you by your health care provider. Make sure you discuss any questions you have with your health care provider. °Document Released: 10/13/2013 Document Revised: 02/22/2017 Document Reviewed: 02/22/2017 °Elsevier Interactive Patient Education © 2019 [employer] ° °

## 2018-08-16 NOTE — Progress Notes (Signed)
Pt ambulated to bathroom without difficulty or bleeding noted. Voided without difficulty. Spoke with pt and wife about discharge instructions with stated understanding. Ready for discharge.

## 2018-08-17 MED FILL — Clopidogrel Bisulfate Tab 75 MG (Base Equiv): ORAL | Qty: 1 | Status: AC

## 2018-08-22 ENCOUNTER — Other Ambulatory Visit: Payer: Self-pay | Admitting: Family Medicine

## 2018-08-25 ENCOUNTER — Other Ambulatory Visit: Payer: Self-pay | Admitting: Family Medicine

## 2018-08-25 ENCOUNTER — Other Ambulatory Visit: Payer: Self-pay

## 2018-08-25 ENCOUNTER — Encounter: Payer: Self-pay | Admitting: Family Medicine

## 2018-08-25 ENCOUNTER — Ambulatory Visit (INDEPENDENT_AMBULATORY_CARE_PROVIDER_SITE_OTHER): Payer: Medicare Other | Admitting: Family Medicine

## 2018-08-25 VITALS — BP 130/78 | HR 72 | Temp 98.6°F | Resp 14 | Ht 67.5 in | Wt 166.0 lb

## 2018-08-25 DIAGNOSIS — Z0001 Encounter for general adult medical examination with abnormal findings: Secondary | ICD-10-CM

## 2018-08-25 DIAGNOSIS — M810 Age-related osteoporosis without current pathological fracture: Secondary | ICD-10-CM

## 2018-08-25 DIAGNOSIS — F321 Major depressive disorder, single episode, moderate: Secondary | ICD-10-CM

## 2018-08-25 DIAGNOSIS — I739 Peripheral vascular disease, unspecified: Secondary | ICD-10-CM

## 2018-08-25 DIAGNOSIS — C61 Malignant neoplasm of prostate: Secondary | ICD-10-CM

## 2018-08-25 DIAGNOSIS — Z0000 Encounter for general adult medical examination without abnormal findings: Secondary | ICD-10-CM

## 2018-08-25 NOTE — Patient Instructions (Addendum)
Okay to increase topamax to 75mg  once a day  We will check on stress  F/U 4 months

## 2018-08-25 NOTE — Progress Notes (Signed)
Subjective:   Patient presents for Medicare Annual/Subsequent preventive examination.    Patient here for annual wellness visit.Marland Kitchen  He is also been evaluated by vascular due to concern for worsening peripheral vascular disease and occlusion.  We discussed  this at our visit in May.  - Plavix for 3 months  Since his stent placed by vascular   Medications reviewed   History of prostate cancer-PSAs are monitored yearly far no recurrence of cancer- last done in Feb  Chronic pain/RLS- on hydrocodone   Hyperlipidemia- on statin drug -lipids at goal in March  Migraines- on topamax at bedtime- No change in vision/ in vision   Mianitaning weight , still has lot of stress caring for father which contributes to headaches   Review Past Medical/Family/Social: per EMR    Risk Factors  Current exercise habits: no routine exercise  Dietary issues discussed: watching cholesterol  Cardiac risk factors: Hyperlipidemia  Depression Screen  (Note: if answer to either of the following is "Yes", a more complete depression screening is indicated)  Over the past two weeks, have you felt down, depressed or hopeless? No Over the past two weeks, have you felt little interest or pleasure in doing things? No Have you lost interest or pleasure in daily life? No Do you often feel hopeless? No Do you cry easily over simple problems? No   Activities of Daily Living  In your present state of health, do you have any difficulty performing the following activities?:  Driving? No  Managing money? No  Feeding yourself? No  Getting from bed to chair? No  Climbing a flight of stairs? No  Preparing food and eating?: No  Bathing or showering? No  Getting dressed: No  Getting to the toilet? No  Using the toilet:No  Moving around from place to place: No  In the past year have you fallen or had a near fall?:No  Are you sexually active? No  Do you have more than one partner? No   Hearing Difficulties: No  Do you  often ask people to speak up or repeat themselves? No  Do you experience ringing or noises in your ears? No Do you have difficulty understanding soft or whispered voices? No  Do you feel that you have a problem with memory? No Do you often misplace items? No  Do you feel safe at home? Yes  Cognitive Testing  Alert? Yes Normal Appearance?Yes  Oriented to person? Yes Place? Yes  Time? Yes  Recall of three objects? Yes  Can perform simple calculations? Yes  Displays appropriate judgment?Yes  Can read the correct time from a watch face?Yes   List the Names of Other Physician/Practitioners you currently use:   Urology [facility]     Screening Tests / Date Colonoscopy  UTD                   Shingrix- Due for 2nd booster  Pneumonia- UTD Influenza Vaccine  Tetanus/tdap Due Bone Denstiy- June 2019  ROS: GEN- denies fatigue, fever, weight loss,weakness, recent illness HEENT- denies eye drainage, change in vision, nasal discharge, CVS- denies chest pain, palpitations RESP- denies SOB, cough, wheeze ABD- denies N/V, change in stools, abd pain GU- denies dysuria, hematuria, dribbling, incontinence MSK- + joint pain, muscle aches, injury Neuro- denies headache, dizziness, syncope, seizure activity  Physical: vitals reviewed GEN- NAD, alert and oriented x3 HEENT- PERRL, EOMI, non injected sclera, pink conjunctiva, MMM, oropharynx clear Neck- Supple, no thryomegaly CVS- RRR, no murmur RESP-CTAB ABD-NABS,soft,NT,ND Psych-  normal affect and mood  EXT- No edema Pulses- Radial, DP- 2+  Assessment:    Annual wellness medicare exam   Plan:    During the course of the visit the patient was educated and counseled about appropriate screening and preventive services including:   Migraines- stress related, continue topamax, advised can try increasing to 75mg , no new neurolgical changes so will hold on MRI of brainunless he does get new symptoms  MDD- Stress as caregiver- declines  any other meds, discussed trying to get an Aide but cost is issue, [facility] is [facility]   continue remeron  Osteoporosis- continue reclast, vitamin D/calcium  HTN well controlled   Prostate cancer- PSA looks good on surveillance  Immunizations- check with pharamcy on2nd shingrix and TDAP   aduit C/Fall negative   Recent labs normal           Diet review for nutrition referral? Yes ____ Not Indicated __x__  Patient Instructions (the written plan) was given to the patient.  Medicare Attestation  I have personally reviewed:  The patient's medical and social history  Their use of alcohol, tobacco or illicit drugs  Their current medications and supplements  The patient's functional ability including ADLs,fall risks, home safety risks, cognitive, and hearing and visual impairment  Diet and physical activities  Evidence for depression or mood disorders  The patient's weight, height, BMI, and visual acuity have been recorded in the chart. I have made referrals, counseling, and provided education to the patient based on review of the above and I have provided the patient with a written personalized care plan for preventive services.

## 2018-08-30 ENCOUNTER — Encounter: Payer: Medicare Other | Admitting: Family Medicine

## 2018-09-02 ENCOUNTER — Other Ambulatory Visit: Payer: Self-pay | Admitting: *Deleted

## 2018-09-02 MED ORDER — HYDROCODONE-ACETAMINOPHEN 10-325 MG PO TABS: ORAL_TABLET | ORAL | 0 refills | Status: DC

## 2018-09-02 NOTE — Telephone Encounter (Signed)
Received call from patient wife, Holley Raring.   Requested refill on Hydrocodone/APAP.   Ok to refill??  Last office visit 08/25/2018.  Last refill 08/01/2018.

## 2018-09-06 ENCOUNTER — Other Ambulatory Visit: Payer: Self-pay

## 2018-09-06 ENCOUNTER — Encounter (HOSPITAL_COMMUNITY)
Admission: RE | Admit: 2018-09-06 | Discharge: 2018-09-06 | Disposition: A | Discharge status: Home / Self Care | Payer: Medicare Other | Source: Ambulatory Visit | Attending: Family Medicine | Admitting: Family Medicine

## 2018-09-06 ENCOUNTER — Encounter (HOSPITAL_COMMUNITY): Payer: Self-pay

## 2018-09-06 DIAGNOSIS — M810 Age-related osteoporosis without current pathological fracture: Secondary | ICD-10-CM | POA: Insufficient documentation

## 2018-09-06 MED ORDER — SODIUM CHLORIDE 0.9 % IV SOLN
Freq: Once | INTRAVENOUS | Status: AC
  Administered 2018-09-06: 250 mL via INTRAVENOUS

## 2018-09-06 MED ORDER — ZOLEDRONIC ACID 5 MG/100ML IV SOLN
5.0000 mg | Freq: Once | INTRAVENOUS | Status: AC
  Administered 2018-09-06: 5 mg via INTRAVENOUS

## 2018-09-06 MED ORDER — ZOLEDRONIC ACID 5 MG/100ML IV SOLN
INTRAVENOUS | Status: AC
  Filled 2018-09-06: qty 100

## 2018-09-12 ENCOUNTER — Telehealth: Payer: Self-pay | Admitting: *Deleted

## 2018-09-12 NOTE — Telephone Encounter (Signed)
Set up MRI Brain without contrast- Dx Chronic headaches, dizziness, PVD

## 2018-09-12 NOTE — Telephone Encounter (Signed)
Received call from patient [phone]~ telephone.   Reports that his HA continues Q HS and has actually worsened. Reports that he did increased Topamax as directed with no relief.   MD please advise.

## 2018-09-13 ENCOUNTER — Other Ambulatory Visit: Payer: Self-pay | Admitting: Family Medicine

## 2018-09-13 ENCOUNTER — Other Ambulatory Visit: Payer: Self-pay

## 2018-09-13 ENCOUNTER — Other Ambulatory Visit: Payer: Self-pay | Admitting: *Deleted

## 2018-09-13 DIAGNOSIS — G4489 Other headache syndrome: Secondary | ICD-10-CM

## 2018-09-13 DIAGNOSIS — I739 Peripheral vascular disease, unspecified: Secondary | ICD-10-CM

## 2018-09-13 DIAGNOSIS — R42 Dizziness and giddiness: Secondary | ICD-10-CM

## 2018-09-13 DIAGNOSIS — R519 Headache, unspecified: Secondary | ICD-10-CM

## 2018-09-13 NOTE — Telephone Encounter (Signed)
Orders placed.   Call placed to patient. LMTRC.  

## 2018-09-13 NOTE — Telephone Encounter (Signed)
Call placed to patient and patient made aware.  

## 2018-09-19 ENCOUNTER — Ambulatory Visit (INDEPENDENT_AMBULATORY_CARE_PROVIDER_SITE_OTHER)
Admission: RE | Admit: 2018-09-19 | Discharge: 2018-09-19 | Disposition: A | Discharge status: Home / Self Care | Payer: Medicare Other | Source: Ambulatory Visit | Attending: Family | Admitting: Family

## 2018-09-19 ENCOUNTER — Encounter: Payer: Self-pay | Admitting: Family

## 2018-09-19 ENCOUNTER — Other Ambulatory Visit: Payer: Self-pay

## 2018-09-19 ENCOUNTER — Ambulatory Visit (INDEPENDENT_AMBULATORY_CARE_PROVIDER_SITE_OTHER): Payer: Self-pay | Admitting: Family

## 2018-09-19 ENCOUNTER — Ambulatory Visit (HOSPITAL_COMMUNITY)
Admission: RE | Admit: 2018-09-19 | Discharge: 2018-09-19 | Disposition: A | Discharge status: Home / Self Care | Payer: Medicare Other | Source: Ambulatory Visit | Attending: Family | Admitting: Family

## 2018-09-19 VITALS — BP 113/76 | HR 57 | Temp 97.1°F | Resp 18 | Ht 68.0 in | Wt 162.0 lb

## 2018-09-19 DIAGNOSIS — R51 Headache: Secondary | ICD-10-CM | POA: Diagnosis not present

## 2018-09-19 DIAGNOSIS — I739 Peripheral vascular disease, unspecified: Secondary | ICD-10-CM

## 2018-09-19 DIAGNOSIS — G4489 Other headache syndrome: Secondary | ICD-10-CM | POA: Insufficient documentation

## 2018-09-19 DIAGNOSIS — I779 Disorder of arteries and arterioles, unspecified: Secondary | ICD-10-CM

## 2018-09-19 DIAGNOSIS — R42 Dizziness and giddiness: Secondary | ICD-10-CM | POA: Diagnosis not present

## 2018-09-19 DIAGNOSIS — I771 Stricture of artery: Secondary | ICD-10-CM

## 2018-09-19 DIAGNOSIS — F17200 Nicotine dependence, unspecified, uncomplicated: Secondary | ICD-10-CM

## 2018-09-19 DIAGNOSIS — Z95828 Presence of other vascular implants and grafts: Secondary | ICD-10-CM

## 2018-09-19 NOTE — Patient Instructions (Signed)

## 2018-09-19 NOTE — Progress Notes (Signed)
[facility] [place]   CC: Follow up peripheral artery occlusive disease  History of Present Illness Cory Foley is a 68 y.o. male who is s/p left common iliac artery stenting on 08-16-18 by Dr. Trula Slade for left leg and buttock claudication.  He no longer has any claudication, he walks at least 30 minutes daily.   Prior to this he had undergone a left above-knee to below-knee popliteal artery bypass graft with reversed ipsilateral greater saphenous vein on 10-21-10 by Dr. Trula Slade in the setting of an ischemic left fifth toe.   He denies any history pf stroke or TIA, denies any cardiac problems.  Currently he denies chest pain or dyspnea, denies fever or chills.   Diabetic: No Tobacco use: smoker  3 cigs/day, started smoking at age 66 yrs). Has tried Chantix, nicotine patches, is trying to quit.    Pt meds include: Statin :Yes Betablocker: No ASA: Yes Other anticoagulants/antiplatelets: Plavix  Past Medical History:  Diagnosis Date  . Allergy   . Anxiety   . Arthritis   . BPH with obstruction/lower urinary tract symptoms   . COPD, mild ([place])   . ED (erectile dysfunction)   . GERD (gastroesophageal reflux disease)   . Hyperlipidemia   . Nocturia   . Osteoporosis   . 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 PVD (peripheral vascular disease) ([place])     Social History Social History   Tobacco Use  . Smoking status: Current Every Day Smoker    Packs/day: 0.50    Years: 43.00    Pack years: 21.50    Types: Cigarettes  . Smokeless tobacco: Never Used  . Tobacco comment: 1/2 ppd  Substance Use Topics  . Alcohol use: No    Comment: quit 1.5 years ago  . Drug use: No    Family History Family History  Problem Relation Age of Onset  . Heart disease Mother        Valve regurgitation and Pacemaker   . Diabetes Mother   . Hyperlipidemia Mother   . Heart disease Father        CABG x 5   . Hyperlipidemia Father   . Hypertension Father   . Heart attack Father   . Heart disease Sister        aortic valve replacement  . Cancer Sister 14       Colon cancer w/ metastasis  . Hypertension Brother     Past Surgical History:  Procedure Laterality Date  . ABDOMINAL AORTOGRAM W/LOWER EXTREMITY N/A 08/16/2018   Procedure: ABDOMINAL AORTOGRAM W/LOWER EXTREMITY;  Surgeon: Serafina Mitchell, MD;  Location: [facility];  Service: Cardiovascular;  Laterality: N/A;  Bilateral  . COLONOSCOPY N/A 09/28/2012   Procedure: COLONOSCOPY;  Surgeon: Danie Binder, MD;  [facility];  Service: Endoscopy;  Laterality: N/A;  8:30  . ESOPHAGOGASTRODUODENOSCOPY N/A 09/28/2012   Procedure: ESOPHAGOGASTRODUODENOSCOPY (EGD);  Surgeon: Danie Binder, MD;  [facility];  Service: Endoscopy;  Laterality: N/A;  . Gold seed implatation  04/28/2012  . HERNIA REPAIR     Bilateral inguinal X2  . JOINT REPLACEMENT     bilateral  . KNEE SURGERY     Left Knee X 2   and Right knee X1  . MALONEY DILATION N/A 09/28/2012   Procedure: MALONEY DILATION;  Surgeon: Danie Binder, MD;  [facility];  Service: Endoscopy;  Laterality: N/A;  .  NOSE SURGERY    . PERIPHERAL VASCULAR INTERVENTION  08/16/2018   Procedure: PERIPHERAL VASCULAR INTERVENTION;  Surgeon: Serafina Mitchell, MD;  Location: [facility];  Service: Cardiovascular;;  LT Iliac  . PR VEIN BYPASS GRAFT,AORTO-FEM-POP  10/21/10   Left AK to BK popliteal BPG  . PROSTATE BIOPSY  01/11/2012   Adenocarcinoma  . PROSTATE SURGERY  2015   Chemo and  Radiation  . SAVORY DILATION N/A 09/28/2012   Procedure: SAVORY DILATION;  Surgeon: Danie Binder, MD;  [facility];  Service: Endoscopy;  Laterality: N/A;    Allergies  Allergen Reactions  . Codeine Other (See Comments)    Bad headache and sweats  . Morphine And Related Itching    sweats  . Percocet [Oxycodone-Acetaminophen] Other (See Comments)     Headache    Current Outpatient Medications  Medication Sig Dispense Refill  . albuterol (PROVENTIL HFA;VENTOLIN HFA) 108 (90 BASE) MCG/ACT inhaler Inhale 2 puffs into the lungs every 6 (six) hours as needed for wheezing. 1 Inhaler 0  . aspirin EC 81 MG tablet Take 81 mg by mouth daily.    . calcium carbonate (OS-CAL) 600 MG tablet Take 1 tablet (600 mg total) by mouth 2 (two) times daily with a meal. (Patient taking differently: Take 600 mg by mouth daily. ) 30 tablet   . cholecalciferol (VITAMIN D) 1000 units tablet Take 1 tablet (1,000 Units total) by mouth daily.    . clopidogrel (PLAVIX) 75 MG tablet Take 1 tablet by mouth daily. x3 months    . cyclobenzaprine (FLEXERIL) 5 MG tablet Take 10 mg by mouth at bedtime.    . famotidine (PEPCID) 20 MG tablet TAKE 1 TABLET BY MOUTH AT BEDTIME. 90 tablet 3  . HYDROcodone-acetaminophen (NORCO) 10-325 MG tablet TAKE 1 TABLET BY MOUTH EVERY FOUR HOURS AS NEEDED. 120 tablet 0  . methocarbamol (ROBAXIN) 500 MG tablet Take 500 mg by mouth 4 (four) times daily as needed (pain.).     Marland Kitchen mirtazapine (REMERON) 15 MG tablet Take 1 tablet (15 mg total) by mouth at bedtime. 30 tablet 2  . pantoprazole (PROTONIX) 40 MG tablet TAKE ONE TABLET BY MOUTH DAILY. (Patient taking differently: Take 40 mg by mouth at bedtime. ) 90 tablet 3  . rOPINIRole (REQUIP) 3 MG tablet Take 1 tablet (3 mg total) by mouth at bedtime. 30 tablet 2  . simvastatin (ZOCOR) 40 MG tablet TAKE (1) TABLET BY MOUTH AT BEDTIME. 90 tablet 0  . topiramate (TOPAMAX) 25 MG tablet Take 1-2 tablets (25-50 mg total) by mouth at bedtime. 60 tablet 1   No current facility-administered medications for this visit.     ROS: See HPI for pertinent positives and negatives.   Physical Examination  Vitals:   09/19/18 0835  BP: 113/76  Pulse: (!) 57  Resp: 18  Temp: (!) 97.1 F (36.2 C)  TempSrc: Temporal  SpO2: 97%  Weight: 162 lb (73.5 kg)  Height: 5\' 8"  (1.727 m)   Body mass index is 24.63  kg/m.  General: A&O x 3, WDWN, fit appearing male. Gait: normal HEENT: No gross abnormalities.  Pulmonary: Respirations are non labored, CTAB, good air movement in all fields Cardiac: regular rhythm, no detected murmur.         Carotid Bruits Right Left   Negative Negative   Radial pulses are 2+ palpable bilaterally   Adominal aortic pulse is NOT palpable  VASCULAR EXAM: Extremities without ischemic changes, without Gangrene; without open wounds.                                                                                                          LE Pulses Right Left       FEMORAL  2+ palpable  2+ palpable        POPLITEAL  2+ palpable   2+ palpable       POSTERIOR TIBIAL  2+ palpable   not palpable        DORSALIS PEDIS      ANTERIOR TIBIAL 2+ palpable  2+ palpable    Abdomen: soft, NT, no palpable masses. Skin: no rashes, no cellulitis, no ulcers noted. Musculoskeletal: no muscle wasting or atrophy.  Neurologic: A&O X 3; appropriate affect, Sensation is normal; MOTOR FUNCTION:  moving all extremities equally, motor strength 5/5 throughout. Speech is fluent/normal. CN 2-12 intact. Psychiatric: Thought content is normal, mood appropriate for clinical situation.     ASSESSMENT: Cory Foley is a 68 y.o. male who is s/p left common iliac artery stenting on 08-16-18 by Dr. Trula Slade for left leg and buttock claudication.  He not longer has any claudication, bilateral pedal pulses are palpable.   Prior to this he had undergone a left above-knee to below-knee popliteal artery bypass graft with reversed ipsilateral greater saphenous vein on 10-21-10 by Dr. Trula Slade in the setting of an ischemic left fifth toe.   ABI's today are normal with all triphasic waveforms. Left iliac stent duplex shows no stenosis.   He has no bleeding problems taking Plavix and daily ASA; will continue both as long as he is using tobacco; a month or 2 after he stops tobacco  may stop the Plavix.   DATA  Left Iliac Artery stent Duplex (09-19-18): Left Stent(s): +---------------+---++++ Prox to Stent  69  +---------------+---++++ Proximal Stent 142 +---------------+---++++ Mid Stent      90  +---------------+---++++ Distal Stent   109 +---------------+---++++ Distal to Stent96  +---------------+---++++  Summary: Stenosis: +-----------------+-----------+ Location         Stent       +-----------------+-----------+ Left Common Iliacno stenosis +-----------------+-----------+   ABI (Date: 09/19/2018): ABI Findings: +---------+------------------+-----+---------+--------+ Right    Rt Pressure (mmHg)IndexWaveform Comment  +---------+------------------+-----+---------+--------+ Brachial 130                                      +---------+------------------+-----+---------+--------+ PTA      143               1.10 triphasic         +---------+------------------+-----+---------+--------+ DP       138               1.06 triphasic         +---------+------------------+-----+---------+--------+ Great Toe91                0.70                   +---------+------------------+-----+---------+--------+  +---------+------------------+-----+---------+-------+  Left     Lt Pressure (mmHg)IndexWaveform Comment +---------+------------------+-----+---------+-------+ Brachial 128                                     +---------+------------------+-----+---------+-------+ PTA      143               1.10 triphasic        +---------+------------------+-----+---------+-------+ DP       144               1.11 triphasic        +---------+------------------+-----+---------+-------+ Great Toe88                0.68                  +---------+------------------+-----+---------+-------+  +-------+-----------+-----------+------------+------------+ ABI/TBIToday's ABIToday's  TBIPrevious ABIPrevious TBI +-------+-----------+-----------+------------+------------+ Right  1.10       0.70       1.11        0.59         +-------+-----------+-----------+------------+------------+ Left   1.11       0.68       0.84        0.39         +-------+-----------+-----------+------------+------------+  Right ABIs appear essentially unchanged. Left ABIs appear increased compared to prior study on 07/25/2018.   Summary: Right: Resting right ankle-brachial index is within normal range. No evidence of significant right lower extremity arterial disease. The right toe-brachial index is normal.  Left: Resting left ankle-brachial index is within normal range. No evidence of significant left lower extremity arterial disease. The left toe-brachial index is abnormal.    PLAN:  Based on the patient's vascular studies and examination, pt will return to clinic in 3 months With left iliac artery stent duplex and ABI's.  Over 3 minutes was spent counseling patient re smoking cessation, and patient was given several free resources re smoking cessation.  I discussed in depth with the patient the nature of atherosclerosis, and emphasized the importance of maximal medical management including strict control of blood pressure, blood glucose, and lipid levels, obtaining regular exercise, and cessation of smoking.  The patient is aware that without maximal medical management the underlying atherosclerotic disease process will progress, limiting the benefit of any interventions.  The patient was given information about PAD including signs, symptoms, treatment, what symptoms should prompt the patient to seek immediate medical care, and risk reduction measures to take.  Clemon Chambers, RN, MSN, FNP-C [facility] Phone: [phone]  Clinic MD: Oneida Alar on call  09/19/18 8:40 AM

## 2018-09-20 ENCOUNTER — Ambulatory Visit (HOSPITAL_COMMUNITY)
Admission: RE | Admit: 2018-09-20 | Discharge: 2018-09-20 | Disposition: A | Discharge status: Home / Self Care | Payer: Medicare Other | Source: Ambulatory Visit | Attending: Family Medicine | Admitting: Family Medicine

## 2018-09-20 DIAGNOSIS — R51 Headache: Secondary | ICD-10-CM | POA: Diagnosis not present

## 2018-09-20 DIAGNOSIS — I739 Peripheral vascular disease, unspecified: Secondary | ICD-10-CM | POA: Diagnosis not present

## 2018-09-20 DIAGNOSIS — R42 Dizziness and giddiness: Secondary | ICD-10-CM

## 2018-09-20 DIAGNOSIS — G4489 Other headache syndrome: Secondary | ICD-10-CM

## 2018-09-20 DIAGNOSIS — R519 Headache, unspecified: Secondary | ICD-10-CM

## 2018-09-21 ENCOUNTER — Other Ambulatory Visit: Payer: Self-pay | Admitting: *Deleted

## 2018-09-21 MED ORDER — TOPIRAMATE 25 MG PO TABS: 75.0000 mg | ORAL_TABLET | Freq: Every day | ORAL | 0 refills | Status: DC

## 2018-09-25 ENCOUNTER — Other Ambulatory Visit: Payer: Self-pay | Admitting: Family Medicine

## 2018-09-29 ENCOUNTER — Telehealth: Payer: Self-pay | Admitting: *Deleted

## 2018-09-29 NOTE — Telephone Encounter (Signed)
Received call from patient.   Reports that since increasing the Topamax, his headaches are better, but still occur nightly. States that he noticed that headache only occurs at night when he is through with being active for the day.   MD to be made aware.

## 2018-09-30 NOTE — Telephone Encounter (Signed)
Call placed to patient and patient made aware. Verbalized understanding.  

## 2018-09-30 NOTE — Telephone Encounter (Signed)
I want him to try splitting the dose He will take 25mg  in the morning and 50mg  in the evening of topamax, see if this wards off the headaches for the evening by having some in his system during the day

## 2018-10-03 ENCOUNTER — Other Ambulatory Visit: Payer: Self-pay | Admitting: Family Medicine

## 2018-10-03 NOTE — Telephone Encounter (Signed)
Requested Prescriptions   Pending Prescriptions Disp Refills  . HYDROcodone-acetaminophen (NORCO) 10-325 MG tablet [Pharmacy Med Name: HYDROCODONE-ACETAMIN 10-325 MG] 120 tablet 0    Sig: TAKE 1 TABLET BY MOUTH EVERY FOUR HOURS AS NEEDED.     Last OV 08/25/2018  Last written 09/02/2018

## 2018-10-08 ENCOUNTER — Other Ambulatory Visit: Payer: Self-pay | Admitting: Family Medicine

## 2018-10-10 ENCOUNTER — Other Ambulatory Visit: Payer: Self-pay | Admitting: Family Medicine

## 2018-10-10 ENCOUNTER — Telehealth: Payer: Self-pay | Admitting: *Deleted

## 2018-10-10 NOTE — Telephone Encounter (Signed)
Received call from patient wife, Holley Raring.   Reports that patient is taking Topamax 25mg  (1) tab Q AM, (1) tab Q PM, and (1) tab PO QHS.  States that even though he is taking medications TID, he is still having HA at night. Reports that when pt has HA, he becomes very irritable and usually just goes to bed.   Inquired as to if referral to Neurology would be appropriate.   MD please advise.

## 2018-10-11 NOTE — Telephone Encounter (Signed)
I have discussed this with patient , if he is in agreement - please speak directly to him not wife, then okay to send neurology referral Also, take Topamax 25mg  in AM and 50Mg  in evening, do not split the evening doses    Dx Migraine headaches

## 2018-10-11 NOTE — Telephone Encounter (Signed)
Call placed to patient and patient made aware.   States that he will take the 50mg  dose at 5pm to see if it will help stave off the HA later that night.   Patient reports that he would like to try medication for a little while longer before getting referral to neuro.

## 2018-10-16 ENCOUNTER — Other Ambulatory Visit: Payer: Self-pay | Admitting: Family Medicine

## 2018-11-08 ENCOUNTER — Other Ambulatory Visit: Payer: Self-pay | Admitting: Family Medicine

## 2018-11-09 ENCOUNTER — Other Ambulatory Visit: Payer: Self-pay | Admitting: *Deleted

## 2018-11-09 MED ORDER — HYDROCODONE-ACETAMINOPHEN 10-325 MG PO TABS: 1.0000 | ORAL_TABLET | ORAL | 0 refills | Status: DC | PRN

## 2018-11-09 NOTE — Telephone Encounter (Signed)
Ok to refill??  Last office visit 08/25/2018.  Last refill 10/03/2018.

## 2018-11-25 ENCOUNTER — Other Ambulatory Visit: Payer: Self-pay | Admitting: Family Medicine

## 2018-12-06 ENCOUNTER — Other Ambulatory Visit: Payer: Self-pay | Admitting: Family Medicine

## 2018-12-13 ENCOUNTER — Other Ambulatory Visit: Payer: Self-pay | Admitting: Family Medicine

## 2018-12-13 NOTE — Telephone Encounter (Signed)
Ok to refill??  Last office visit 08/25/2018.  Last refill 11/09/2018.

## 2018-12-15 ENCOUNTER — Other Ambulatory Visit: Payer: Self-pay

## 2018-12-15 DIAGNOSIS — I779 Disorder of arteries and arterioles, unspecified: Secondary | ICD-10-CM

## 2018-12-15 DIAGNOSIS — I771 Stricture of artery: Secondary | ICD-10-CM

## 2018-12-16 ENCOUNTER — Telehealth (HOSPITAL_COMMUNITY): Payer: Self-pay | Admitting: *Deleted

## 2018-12-16 NOTE — Telephone Encounter (Signed)

## 2018-12-19 ENCOUNTER — Other Ambulatory Visit: Payer: Self-pay

## 2018-12-19 ENCOUNTER — Ambulatory Visit (INDEPENDENT_AMBULATORY_CARE_PROVIDER_SITE_OTHER)
Admission: RE | Admit: 2018-12-19 | Discharge: 2018-12-19 | Disposition: A | Discharge status: Home / Self Care | Payer: Medicare Other | Source: Ambulatory Visit | Attending: Family | Admitting: Family

## 2018-12-19 ENCOUNTER — Ambulatory Visit (HOSPITAL_COMMUNITY)
Admission: RE | Admit: 2018-12-19 | Discharge: 2018-12-19 | Disposition: A | Discharge status: Home / Self Care | Payer: Medicare Other | Source: Ambulatory Visit | Attending: Family | Admitting: Family

## 2018-12-19 DIAGNOSIS — I771 Stricture of artery: Secondary | ICD-10-CM

## 2018-12-19 DIAGNOSIS — I779 Disorder of arteries and arterioles, unspecified: Secondary | ICD-10-CM | POA: Diagnosis not present

## 2018-12-23 ENCOUNTER — Encounter: Payer: Self-pay | Admitting: Family

## 2018-12-23 ENCOUNTER — Ambulatory Visit (INDEPENDENT_AMBULATORY_CARE_PROVIDER_SITE_OTHER): Payer: Medicare Other | Admitting: Family

## 2018-12-23 ENCOUNTER — Other Ambulatory Visit: Payer: Self-pay

## 2018-12-23 VITALS — BP 120/73 | HR 62 | Ht 68.0 in | Wt 164.0 lb

## 2018-12-23 DIAGNOSIS — F17200 Nicotine dependence, unspecified, uncomplicated: Secondary | ICD-10-CM | POA: Diagnosis not present

## 2018-12-23 DIAGNOSIS — I771 Stricture of artery: Secondary | ICD-10-CM

## 2018-12-23 DIAGNOSIS — I779 Disorder of arteries and arterioles, unspecified: Secondary | ICD-10-CM | POA: Diagnosis not present

## 2018-12-23 DIAGNOSIS — Z95828 Presence of other vascular implants and grafts: Secondary | ICD-10-CM | POA: Diagnosis not present

## 2018-12-23 NOTE — Patient Instructions (Signed)

## 2018-12-23 NOTE — Progress Notes (Signed)
Virtual Visit via Telephone Note   I connected with Cory Foley on 12/23/2018 using the [employer] by telephone and verified that I was speaking with the correct person using two identifiers. Patient was located at his home and accompanied by himself. I am located at the [facility] office/clinic.   The limitations of evaluation and management by telemedicine and the availability of in person appointments have been previously discussed with the patient and are documented in the patients chart. The patient expressed understanding and consented to proceed.  PCP: Alycia Rossetti, MD  Chief Complaint: Follow up peripheral artery occlusive disease   History of Present Illness: Cory Foley is a 68 y.o. male who is s/p left common iliac artery stenting on 08-16-18 by Dr. Trula Slade for left leg and buttock claudication.  He no longer has any claudication, he walks at least 30 minutes daily.   Prior to this he had undergone a left above-knee to below-knee popliteal artery bypass graft with reversed ipsilateral greater saphenous vein on 10-21-10 by Dr. Trula Slade in the setting of an ischemic left fifth toe.   He denies any history pf stroke or TIA, denies any cardiac problems.  Currently he denies chest pain or dyspnea, denies fever or chills.   Diabetic: No Tobacco use: smoker 3 cigs/day, started smoking at age 27 yrs). Has tried Chantix, nicotine patches, is trying to quit.    Pt meds include: Statin :Yes Betablocker: No ASA: Yes Other anticoagulants/antiplatelets: Plavix; pt denies any bleeding issues    Past Medical History:  Diagnosis Date  . Allergy   . Anxiety   . Arthritis   . BPH with obstruction/lower urinary tract symptoms   . COPD, mild ([place])   . ED (erectile dysfunction)   . GERD (gastroesophageal reflux disease)   . Hyperlipidemia   . Nocturia   . Osteoporosis   . Prostate cancer ([place]) 01/11/2012   Adenocarcinoma, Gleason=4+4=8, & 4+5=9,PSA=17.24, Volume= 15.45cc  .  PVD (peripheral vascular disease) ([place])     Past Surgical History:  Procedure Laterality Date  . ABDOMINAL AORTOGRAM W/LOWER EXTREMITY N/A 08/16/2018   Procedure: ABDOMINAL AORTOGRAM W/LOWER EXTREMITY;  Surgeon: Serafina Mitchell, MD;  Location: [facility];  Service: Cardiovascular;  Laterality: N/A;  Bilateral  . COLONOSCOPY N/A 09/28/2012   Procedure: COLONOSCOPY;  Surgeon: Danie Binder, MD;  [facility];  Service: Endoscopy;  Laterality: N/A;  8:30  . ESOPHAGOGASTRODUODENOSCOPY N/A 09/28/2012   Procedure: ESOPHAGOGASTRODUODENOSCOPY (EGD);  Surgeon: Danie Binder, MD;  [facility];  Service: Endoscopy;  Laterality: N/A;  . Gold seed implatation  04/28/2012  . HERNIA REPAIR     Bilateral inguinal X2  . JOINT REPLACEMENT     bilateral  . KNEE SURGERY     Left Knee X 2   and Right knee X1  . MALONEY DILATION N/A 09/28/2012   Procedure: MALONEY DILATION;  Surgeon: Danie Binder, MD;  [facility];  Service: Endoscopy;  Laterality: N/A;  . NOSE SURGERY    . PERIPHERAL VASCULAR INTERVENTION  08/16/2018   Procedure: PERIPHERAL VASCULAR INTERVENTION;  Surgeon: Serafina Mitchell, MD;  Location: [facility];  Service: Cardiovascular;;  LT Iliac  . PR VEIN BYPASS GRAFT,AORTO-FEM-POP  10/21/10   Left AK to BK popliteal BPG  . PROSTATE BIOPSY  01/11/2012   Adenocarcinoma  . PROSTATE SURGERY  2015   Chemo and  Radiation  . SAVORY DILATION N/A 09/28/2012   Procedure:  SAVORY DILATION;  Surgeon: Danie Binder, MD;  [facility];  Service: Endoscopy;  Laterality: N/A;    Current Meds  Medication Sig  . albuterol (PROVENTIL HFA;VENTOLIN HFA) 108 (90 BASE) MCG/ACT inhaler Inhale 2 puffs into the lungs every 6 (six) hours as needed for wheezing.  Marland Kitchen aspirin EC 81 MG tablet Take 81 mg by mouth daily.  . calcium carbonate (OS-CAL) 600 MG tablet Take 1 tablet (600 mg total) by mouth 2 (two) times daily with a meal. (Patient taking differently: Take  600 mg by mouth daily. )  . cholecalciferol (VITAMIN D) 1000 units tablet Take 1 tablet (1,000 Units total) by mouth daily.  . clopidogrel (PLAVIX) 75 MG tablet Take 1 tablet by mouth daily. x3 months  . cyclobenzaprine (FLEXERIL) 5 MG tablet Take 10 mg by mouth at bedtime.  . famotidine (PEPCID) 20 MG tablet TAKE 1 TABLET BY MOUTH AT BEDTIME.  Marland Kitchen HYDROcodone-acetaminophen (NORCO) 10-325 MG tablet TAKE 1 TABLET BY MOUTH EVERY FOUR HOURS AS NEEDED.  . methocarbamol (ROBAXIN) 500 MG tablet Take 500 mg by mouth 4 (four) times daily as needed (pain.).   Marland Kitchen mirtazapine (REMERON) 15 MG tablet TAKE 1 TABLET BY MOUTH AT BEDTIME.  . pantoprazole (PROTONIX) 40 MG tablet TAKE ONE TABLET BY MOUTH DAILY.  Marland Kitchen rOPINIRole (REQUIP) 3 MG tablet TAKE (1) TABLET BY MOUTH AT BEDTIME.  . simvastatin (ZOCOR) 40 MG tablet TAKE (1) TABLET BY MOUTH AT BEDTIME.  Marland Kitchen topiramate (TOPAMAX) 25 MG tablet TAKE 3 TABLETS BY MOUTH DAILY.    12 system ROS was negative unless otherwise noted in HPI   Observations/Objective:  DATA  Left Aortoiliac Duplex (12-19-18): Left Stent(s): +---------------+---++---------++ Prox to Stent  61 biphasic  +---------------+---++---------++ Proximal Stent 43 triphasic +---------------+---++---------++ Mid Stent      82 biphasic  +---------------+---++---------++ Distal Stent   109biphasic  +---------------+---++---------++ Distal to Stent86 biphasic  +---------------+---++---------++  Right CFA 194 cm/s triphasic waveform Left CFA 129 cm/a triphasic waveform Summary: IVC/Iliac: Patent left common iliac artery stent with no evidence for restenosis.    ABI Findings (12-23-18): +---------+------------------+-----+---------+--------+ Right    Rt Pressure (mmHg)IndexWaveform Comment  +---------+------------------+-----+---------+--------+ Brachial 115                                       +---------+------------------+-----+---------+--------+ PTA      148               1.21 triphasic         +---------+------------------+-----+---------+--------+ DP       145               1.19 triphasic         +---------+------------------+-----+---------+--------+ Great Toe91                0.75 Normal            +---------+------------------+-----+---------+--------+  +---------+------------------+-----+---------+-------+ Left     Lt Pressure (mmHg)IndexWaveform Comment +---------+------------------+-----+---------+-------+ Brachial 122                                     +---------+------------------+-----+---------+-------+ PTA      150               1.23 triphasic        +---------+------------------+-----+---------+-------+ DP       140  1.15 biphasic         +---------+------------------+-----+---------+-------+ Great Toe80                0.66 Abnormal         +---------+------------------+-----+---------+-------+  +-------+-----------+-----------+------------+------------+ ABI/TBIToday's ABIToday's TBIPrevious ABIPrevious TBI +-------+-----------+-----------+------------+------------+ Right  1.21       0.75       1.10        0.70         +-------+-----------+-----------+------------+------------+ Left   1.23       0.66       1.11        0.68         +-------+-----------+-----------+------------+------------+ Summary: Right: Resting right ankle-brachial index is within normal range. No evidence of significant right lower extremity arterial disease. The right toe-brachial index is normal.  Left: Resting left ankle-brachial index is within normal range. No evidence of significant left lower extremity arterial disease. The left toe-brachial index is abnormal.    Assessment and Plan:  who is s/p left common iliac artery stenting on 08-16-18 by Dr. Trula Slade for left leg and buttock claudication.   He no longer has any claudication, he walks at least 30 minutes daily.   Prior to this he had undergone a left above-knee to below-knee popliteal artery bypass graft with reversed ipsilateral greater saphenous vein on 10-21-10 by Dr. Trula Slade in the setting of an ischemic left fifth toe.   Left aortoiliac duplex on 12-19-18 shows no significant stenosis. ABI's and TBI's remain normal with all triphasic waveforms.   His atherosclerotic risk factors include smoking since age 89 (he is trying to quit), dyslipidemia, COPD, and a history of prostate cancer.  He takes a daily Plavix, 81 mg ASA, and a statin.    Follow Up Instructions:  Follow up 6 months with left iliac artery stent duplex and ABI's. I advised him to notify us if he develops concerns re the circulation in his feet or legs Continue walking for exercise at least 30 minutes total daily in a safe environment. Continue daily Plavix, 81 mg ASA, and a statin.  Over 3 minutes was spent counseling patient re smoking cessation, and patient was mailed some information re smoking cessation.  I discussed the assessment and treatment plan with the patient. The patient was provided an opportunity to ask questions and all were answered. The patient agreed with the plan and demonstrated an understanding of the instructions.   The patient was advised to call back or seek an in-person evaluation if the symptoms worsen or if the condition fails to improve as anticipated.  I spent 9 minutes with the patient via telephone encounter.   Gabrielle Dare [facility] Office: [phone]  12/23/2018, 11:22 AM

## 2018-12-24 ENCOUNTER — Other Ambulatory Visit: Payer: Self-pay | Admitting: Family Medicine

## 2018-12-26 ENCOUNTER — Ambulatory Visit (INDEPENDENT_AMBULATORY_CARE_PROVIDER_SITE_OTHER): Payer: Medicare Other | Admitting: Family Medicine

## 2018-12-26 ENCOUNTER — Other Ambulatory Visit: Payer: Self-pay

## 2018-12-26 ENCOUNTER — Encounter: Payer: Self-pay | Admitting: Family Medicine

## 2018-12-26 VITALS — BP 110/74 | HR 66 | Temp 98.1°F | Resp 18 | Ht 68.0 in | Wt 165.6 lb

## 2018-12-26 DIAGNOSIS — E7800 Pure hypercholesterolemia, unspecified: Secondary | ICD-10-CM

## 2018-12-26 DIAGNOSIS — Z23 Encounter for immunization: Secondary | ICD-10-CM | POA: Diagnosis not present

## 2018-12-26 DIAGNOSIS — F321 Major depressive disorder, single episode, moderate: Secondary | ICD-10-CM | POA: Diagnosis not present

## 2018-12-26 DIAGNOSIS — G43709 Chronic migraine without aura, not intractable, without status migrainosus: Secondary | ICD-10-CM | POA: Diagnosis not present

## 2018-12-26 DIAGNOSIS — G43909 Migraine, unspecified, not intractable, without status migrainosus: Secondary | ICD-10-CM | POA: Insufficient documentation

## 2018-12-26 DIAGNOSIS — I739 Peripheral vascular disease, unspecified: Secondary | ICD-10-CM | POA: Diagnosis not present

## 2018-12-26 NOTE — Patient Instructions (Addendum)
F/U 6 months

## 2018-12-26 NOTE — Assessment & Plan Note (Signed)
Recently seen by vascular.  He is on statin drug.  We will recheck his lipid panel and his liver function test.  Blood pressure is controlled.

## 2018-12-26 NOTE — Progress Notes (Signed)
   Subjective:    Patient ID: Cory Foley, male    DOB: 07-06-1950, 68 y.o.   MRN: [id number]  Patient presents for Headache (follow up) Patient to follow-up chronic medical problems.  Medications reviewed. Having significant headaches over the past few months.  MRI did not show any mass or tumor or area of stroke.  He is currently taking topamax  75mg  in the evening. Past few weeks, medications have helped, he now has occasional mild headache but feels much better.    Chronic back pain- has appt November  12th for epidural injections, taking muscle relaxer, pain meds   Sleep has improved with exceptiion of recently for his back flare   Seen by vascular , still on zocor /plavix/ ASA    RLS- still taking Requip as prescribed   Weight- has maintained weight with  Remeron also helps his mood.      Review Of Systems:  GEN- denies fatigue, fever, weight loss,weakness, recent illness HEENT- denies eye drainage, change in vision, nasal discharge, CVS- denies chest pain, palpitations RESP- denies SOB, cough, wheeze ABD- denies N/V, change in stools, abd pain GU- denies dysuria, hematuria, dribbling, incontinence MSK- + joint pain, muscle aches, injury Neuro- denies headache, dizziness, syncope, seizure activity       Objective:    BP 110/74 (BP Location: Right Arm, Patient Position: Sitting, Cuff Size: Normal)   Pulse 66   Temp 98.1 F (36.7 C) (Oral)   Resp 18   Ht 5\' 8"  (1.727 m)   Wt 165 lb 9.6 oz (75.1 kg)   SpO2 96%   BMI 25.18 kg/m  GEN- NAD, alert and oriented x3, antalgic gait HEENT- PERRL, EOMI, non injected sclera, pink conjunctiva, MMM, oropharynx clear Neck- Supple, no thyromegaly CVS- RRR, no murmur RESP-CTAB ABD-NABS,soft,NT,ND EXT- No edema Pulses- Radial, 2+        Assessment & Plan:      Problem List Items Addressed This Visit      Unprioritized   Hyperlipidemia   Relevant Orders   Lipid panel   MDD (major depressive disorder)   Continue with the mirtazapine has helped to maintain his weight as well.  His sleep is also improved.  He does have significant stress being a caregiver to his father and his brother.      Migraines    Symptoms have improved with the Topamax he was concurrently at 75 mg at bedtime if his headaches worsen I would recommend him being seen by neurology      PVD (peripheral vascular disease) ([place]) - Primary    Recently seen by vascular.  He is on statin drug.  We will recheck his lipid panel and his liver function test.  Blood pressure is controlled.      Relevant Orders   CBC with Differential/Platelet   Comprehensive metabolic panel    Other Visit Diagnoses    Need for immunization against influenza       Relevant Orders   Flu Vaccine QUAD High Dose(Fluad) (Completed)      Note: This dictation was prepared with Dragon dictation along with smaller phrase technology. Any transcriptional errors that result from this process are unintentional.

## 2018-12-26 NOTE — Assessment & Plan Note (Signed)
Continue with the mirtazapine has helped to maintain his weight as well.  His sleep is also improved.  He does have significant stress being a caregiver to his father and his brother.

## 2018-12-26 NOTE — Assessment & Plan Note (Signed)
Symptoms have improved with the Topamax he was concurrently at 75 mg at bedtime if his headaches worsen I would recommend him being seen by neurology

## 2018-12-27 LAB — CBC WITH DIFFERENTIAL/PLATELET
Absolute Monocytes: 443 cells/uL (ref 200–950)
Basophils Absolute: 18 cells/uL (ref 0–200)
Basophils Relative: 0.3 %
Eosinophils Absolute: 142 cells/uL (ref 15–500)
Eosinophils Relative: 2.4 %
HCT: 37.6 % — ABNORMAL LOW (ref 38.5–50.0)
Hemoglobin: 12.7 g/dL — ABNORMAL LOW (ref 13.2–17.1)
Lymphs Abs: 1782 cells/uL (ref 850–3900)
MCH: 32 pg (ref 27.0–33.0)
MCHC: 33.8 g/dL (ref 32.0–36.0)
MCV: 94.7 fL (ref 80.0–100.0)
MPV: 11 fL (ref 7.5–12.5)
Monocytes Relative: 7.5 %
Neutro Abs: 3516 cells/uL (ref 1500–7800)
Neutrophils Relative %: 59.6 %
Platelets: 208 10*3/uL (ref 140–400)
RBC: 3.97 10*6/uL — ABNORMAL LOW (ref 4.20–5.80)
RDW: 13.1 % (ref 11.0–15.0)
Total Lymphocyte: 30.2 %
WBC: 5.9 10*3/uL (ref 3.8–10.8)

## 2018-12-27 LAB — LIPID PANEL
Cholesterol: 174 mg/dL (ref <200)
HDL: 42 mg/dL (ref >=40)
LDL Cholesterol (Calc): 107 mg/dL (calc) — ABNORMAL HIGH
Non-HDL Cholesterol (Calc): 132 mg/dL (calc) — ABNORMAL HIGH (ref <130)
Total CHOL/HDL Ratio: 4.1 (calc) (ref <5.0)
Triglycerides: 131 mg/dL (ref <150)

## 2018-12-27 LAB — COMPREHENSIVE METABOLIC PANEL
AG Ratio: 1.7 (calc) (ref 1.0–2.5)
ALT: 9 U/L (ref 9–46)
AST: 12 U/L (ref 10–35)
Albumin: 4.1 g/dL (ref 3.6–5.1)
Alkaline phosphatase (APISO): 55 U/L (ref 35–144)
BUN: 12 mg/dL (ref 7–25)
CO2: 22 mmol/L (ref 20–32)
Calcium: 9.6 mg/dL (ref 8.6–10.3)
Chloride: 112 mmol/L — ABNORMAL HIGH (ref 98–110)
Creat: 1.05 mg/dL (ref 0.70–1.25)
Globulin: 2.4 g/dL (calc) (ref 1.9–3.7)
Glucose, Bld: 85 mg/dL (ref 65–99)
Potassium: 4.4 mmol/L (ref 3.5–5.3)
Sodium: 141 mmol/L (ref 135–146)
Total Bilirubin: 0.3 mg/dL (ref 0.2–1.2)
Total Protein: 6.5 g/dL (ref 6.1–8.1)

## 2019-01-04 ENCOUNTER — Other Ambulatory Visit: Payer: Self-pay | Admitting: Family Medicine

## 2019-01-05 DIAGNOSIS — M47816 Spondylosis without myelopathy or radiculopathy, lumbar region: Secondary | ICD-10-CM | POA: Diagnosis not present

## 2019-01-23 ENCOUNTER — Other Ambulatory Visit: Payer: Self-pay | Admitting: *Deleted

## 2019-01-23 ENCOUNTER — Other Ambulatory Visit: Payer: Self-pay | Admitting: Family Medicine

## 2019-01-23 MED ORDER — HYDROCODONE-ACETAMINOPHEN 10-325 MG PO TABS: ORAL_TABLET | ORAL | 0 refills | Status: DC

## 2019-01-23 NOTE — Telephone Encounter (Signed)
Received call from patient.    Requested refill on Hydrocodone/APAP.   Ok to refill??  Last office visit 12/26/2018.  Last refill 12/13/2018.

## 2019-01-24 ENCOUNTER — Ambulatory Visit (INDEPENDENT_AMBULATORY_CARE_PROVIDER_SITE_OTHER): Payer: Medicare Other | Admitting: Family Medicine

## 2019-01-24 ENCOUNTER — Other Ambulatory Visit: Payer: Self-pay

## 2019-01-24 ENCOUNTER — Encounter: Payer: Self-pay | Admitting: Family Medicine

## 2019-01-24 VITALS — BP 128/66 | HR 66 | Temp 98.2°F | Resp 14 | Ht 68.0 in | Wt 167.0 lb

## 2019-01-24 DIAGNOSIS — R590 Localized enlarged lymph nodes: Secondary | ICD-10-CM | POA: Diagnosis not present

## 2019-01-24 MED ORDER — AMOXICILLIN-POT CLAVULANATE 875-125 MG PO TABS: 1.0000 | ORAL_TABLET | Freq: Two times a day (BID) | ORAL | 0 refills | Status: DC

## 2019-01-24 NOTE — Patient Instructions (Signed)
F/U as previous 

## 2019-01-24 NOTE — Progress Notes (Signed)
   Subjective:    Patient ID: Cory Foley, male    DOB: [birth year], 68 y.o.   MRN: [id number]  Patient presents for Lump in Throat (x10 days- lymph node on L side of neck swwollen- pain to area and down back of neck) Patient here with painful lymph node on the left side of his neck.  Is been there for about 2-1/2 weeks but did not have pain for the past 3 days.  Now when he moves his neck or if he swallows he has discomfort.  If he presses on the area also tender.  He has not had any sinus pressure or drainage no ear pain no cough congestion states that it came up out of nowhere.  He has not had any fever.  No weight loss.  He does have history of prostate cancer.    Review Of Systems:  GEN- denies fatigue, fever, weight loss,weakness, recent illness HEENT- denies eye drainage, change in vision, nasal discharge, CVS- denies chest pain, palpitations RESP- denies SOB, cough, wheeze ABD- denies N/V, change in stools, abd pain GU- denies dysuria, hematuria, dribbling, incontinence MSK- denies joint pain, muscle aches, injury Neuro- denies headache, dizziness, syncope, seizure activity       Objective:    BP 128/66   Pulse 66   Temp 98.2 F (36.8 C) (Temporal)   Resp 14   Ht 5\' 8"  (1.727 m)   Wt 167 lb (75.8 kg)   SpO2 97%   BMI 25.39 kg/m  GEN- NAD, alert and oriented x3 HEENT- PERRL, EOMI, non injected sclera, pink conjunctiva, MMM, oropharynx clear, TM clear bilaterally no effusion canals clear Neck- Supple, no thyromegaly, prominent left anterior cervical lymph node tender to palpation no other lymph nodes palpated no lymph nodes palpated in the posterior chain CVS- RRR, no murmur RESP-CTAB        Assessment & Plan:      Problem List Items Addressed This Visit    None    Visit Diagnoses    Lymphadenopathy, cervical    -  Primary   Cervical node reactive however no recent sign of infection.  He does have underlying history of prostate cancer but has been in  remission with recent negative PSA scores.  We will try him on Augmentin he does have underlying COPD so there may be some type of infection brewing.  He does not not have any other signs concerning for Covid at this time so we will hold off on testing unless he presents with other symptoms.  If this lymph node does not go down because of his prominent size I would obtain imaging in a week.      Note: This dictation was prepared with Dragon dictation along with smaller phrase technology. Any transcriptional errors that result from this process are unintentional.

## 2019-01-25 ENCOUNTER — Encounter: Payer: Self-pay | Admitting: Family Medicine

## 2019-01-30 ENCOUNTER — Telehealth: Payer: Self-pay | Admitting: *Deleted

## 2019-01-30 DIAGNOSIS — Z8546 Personal history of malignant neoplasm of prostate: Secondary | ICD-10-CM

## 2019-01-30 DIAGNOSIS — R221 Localized swelling, mass and lump, neck: Secondary | ICD-10-CM

## 2019-01-30 NOTE — Telephone Encounter (Signed)
Call pt CT scan has been placed for the lymph node

## 2019-01-30 NOTE — Telephone Encounter (Signed)
Received call from patient.   Reports that lymph node in neck has not improved with ABTx.   Requested MD recommendations.

## 2019-01-30 NOTE — Telephone Encounter (Signed)
Called patient and informed him that CT scan has been ordered. Patient verbalized understanding.

## 2019-02-03 ENCOUNTER — Other Ambulatory Visit: Payer: Self-pay | Admitting: Family Medicine

## 2019-02-15 ENCOUNTER — Other Ambulatory Visit: Payer: Self-pay

## 2019-02-15 ENCOUNTER — Ambulatory Visit (HOSPITAL_COMMUNITY)
Admission: RE | Admit: 2019-02-15 | Discharge: 2019-02-15 | Disposition: A | Discharge status: Home / Self Care | Payer: Medicare Other | Source: Ambulatory Visit | Attending: Family Medicine | Admitting: Family Medicine

## 2019-02-15 DIAGNOSIS — Z8546 Personal history of malignant neoplasm of prostate: Secondary | ICD-10-CM | POA: Insufficient documentation

## 2019-02-15 DIAGNOSIS — R221 Localized swelling, mass and lump, neck: Secondary | ICD-10-CM | POA: Diagnosis not present

## 2019-02-15 LAB — POCT I-STAT CREATININE: Creatinine, Ser: 0.9 mg/dL (ref 0.61–1.24)

## 2019-02-15 MED ORDER — IOHEXOL 300 MG/ML  SOLN
75.0000 mL | Freq: Once | INTRAMUSCULAR | Status: AC | PRN
  Administered 2019-02-15: 75 mL via INTRAVENOUS

## 2019-02-20 ENCOUNTER — Encounter: Payer: Self-pay | Admitting: Family Medicine

## 2019-02-20 ENCOUNTER — Other Ambulatory Visit: Payer: Self-pay

## 2019-02-20 ENCOUNTER — Ambulatory Visit (INDEPENDENT_AMBULATORY_CARE_PROVIDER_SITE_OTHER): Payer: Medicare Other | Admitting: Family Medicine

## 2019-02-20 ENCOUNTER — Other Ambulatory Visit: Payer: Self-pay | Admitting: Family Medicine

## 2019-02-20 VITALS — BP 130/66 | HR 74 | Temp 98.7°F | Resp 14 | Ht 68.0 in | Wt 169.0 lb

## 2019-02-20 DIAGNOSIS — C61 Malignant neoplasm of prostate: Secondary | ICD-10-CM | POA: Diagnosis not present

## 2019-02-20 DIAGNOSIS — K1370 Unspecified lesions of oral mucosa: Secondary | ICD-10-CM

## 2019-02-20 DIAGNOSIS — C770 Secondary and unspecified malignant neoplasm of lymph nodes of head, face and neck: Secondary | ICD-10-CM

## 2019-02-20 DIAGNOSIS — C799 Secondary malignant neoplasm of unspecified site: Secondary | ICD-10-CM

## 2019-02-20 DIAGNOSIS — F17200 Nicotine dependence, unspecified, uncomplicated: Secondary | ICD-10-CM

## 2019-02-20 NOTE — Assessment & Plan Note (Signed)
History of prostate cancer now with concerning metastatic lymph node.  Possible this is due to his prostate cancer or another cancer such as lung cancer in the setting of his smoking history or even a lymphoma.  I recommend we obtain a repeat PSA today along with a CBC.  Obtain CT of chest and abdomen.  We will also get IR biopsy of the lymph node.  Discussed this with patient and his wife via the phone.

## 2019-02-20 NOTE — Progress Notes (Signed)
Subjective:    Patient ID: Cory Foley, male    DOB: 23-Feb-1951, 68 y.o.   MRN: [id number]  Patient presents for F/U Scans Here to follow-up his abnormal CT of neck.  He was seen on the Dec 1st secondary to acute lymphadenopathy.  He was treated with a course of antibiotics this did not improve the size of the lymph node.  As he does have history of prostate cancer though in remission he was sent for CT scan   IMPRESSION: Pathologic level 2 lymph node in the neck compatible with metastatic disease. Biopsy recommended.  Possible mucosal lesion left lateral tongue base.  He is also a smoker.  He has had a dry hacking cough for a couple days but no fever congestion body aches associated no known sick contacts he has not had any hemoptysis.  He did have recent MRI secondary to ongoing headaches that did not show any pathologic lesions at that time.  His last CT of chest and abdomen were done in 2016  Reguarding the tongue lesion he has not noted any abnormal spot on the side of his tongue or growth  No weight loss noted   Review Of Systems:  GEN- denies fatigue, fever, weight loss,weakness, recent illness HEENT- denies eye drainage, change in vision, nasal discharge, CVS- denies chest pain, palpitations RESP- denies SOB, cough, wheeze ABD- denies N/V, change in stools, abd pain GU- denies dysuria, hematuria, dribbling, incontinence MSK- + joint pain, muscle aches, injury Neuro- denies headache, dizziness, syncope, seizure activity       Objective:    BP 130/66   Pulse 74   Temp 98.7 F (37.1 C) (Temporal)   Resp 14   Ht 5\' 8"  (1.727 m)   Wt 169 lb (76.7 kg)   SpO2 95%   BMI 25.70 kg/m  GEN- NAD, alert and oriented x3 HEENT- PERRL, EOMI, non injected sclera, pink conjunctiva, MMM, oropharynx clear no discrete lesion of the tongue Neck- Supple, left anterior lymphadenopathy no other lesions palpated CVS- RRR, no murmur RESP-CTAB ABD-NABS,soft,NT,ND EXT- No  edema Pulses- Radial, DP- 2+        Assessment & Plan:      Problem List Items Addressed This Visit      Unprioritized   Prostate cancer ([place])    History of prostate cancer now with concerning metastatic lymph node.  Possible this is due to his prostate cancer or another cancer such as lung cancer in the setting of his smoking history or even a lymphoma.  I recommend we obtain a repeat PSA today along with a CBC.  Obtain CT of chest and abdomen.  We will also get IR biopsy of the lymph node.  Discussed this with patient and his wife via the phone.      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 Any transcriptional errors that result from this process are unintentional.

## 2019-02-20 NOTE — Patient Instructions (Addendum)
CT chest/CT abd to be done We will call with lab results F/U pending results

## 2019-02-21 ENCOUNTER — Ambulatory Visit (HOSPITAL_COMMUNITY)
Admission: RE | Admit: 2019-02-21 | Discharge: 2019-02-21 | Disposition: A | Discharge status: Home / Self Care | Payer: Medicare Other | Source: Ambulatory Visit | Attending: Family Medicine | Admitting: Family Medicine

## 2019-02-21 DIAGNOSIS — C61 Malignant neoplasm of prostate: Secondary | ICD-10-CM

## 2019-02-21 DIAGNOSIS — C799 Secondary malignant neoplasm of unspecified site: Secondary | ICD-10-CM

## 2019-02-21 DIAGNOSIS — C770 Secondary and unspecified malignant neoplasm of lymph nodes of head, face and neck: Secondary | ICD-10-CM

## 2019-02-21 LAB — COMPREHENSIVE METABOLIC PANEL
AG Ratio: 1.6 (calc) (ref 1.0–2.5)
ALT: 8 U/L — ABNORMAL LOW (ref 9–46)
AST: 13 U/L (ref 10–35)
Albumin: 3.9 g/dL (ref 3.6–5.1)
Alkaline phosphatase (APISO): 58 U/L (ref 35–144)
BUN: 11 mg/dL (ref 7–25)
CO2: 24 mmol/L (ref 20–32)
Calcium: 9.2 mg/dL (ref 8.6–10.3)
Chloride: 108 mmol/L (ref 98–110)
Creat: 0.97 mg/dL (ref 0.70–1.25)
Globulin: 2.4 g/dL (calc) (ref 1.9–3.7)
Glucose, Bld: 72 mg/dL (ref 65–99)
Potassium: 4.1 mmol/L (ref 3.5–5.3)
Sodium: 141 mmol/L (ref 135–146)
Total Bilirubin: 0.3 mg/dL (ref 0.2–1.2)
Total Protein: 6.3 g/dL (ref 6.1–8.1)

## 2019-02-21 LAB — CBC WITH DIFFERENTIAL/PLATELET
Absolute Monocytes: 637 cells/uL (ref 200–950)
Basophils Absolute: 34 cells/uL (ref 0–200)
Basophils Relative: 0.5 %
Eosinophils Absolute: 94 cells/uL (ref 15–500)
Eosinophils Relative: 1.4 %
HCT: 38.5 % (ref 38.5–50.0)
Hemoglobin: 12.8 g/dL — ABNORMAL LOW (ref 13.2–17.1)
Lymphs Abs: 2124 cells/uL (ref 850–3900)
MCH: 31.7 pg (ref 27.0–33.0)
MCHC: 33.2 g/dL (ref 32.0–36.0)
MCV: 95.3 fL (ref 80.0–100.0)
MPV: 10.8 fL (ref 7.5–12.5)
Monocytes Relative: 9.5 %
Neutro Abs: 3812 cells/uL (ref 1500–7800)
Neutrophils Relative %: 56.9 %
Platelets: 209 10*3/uL (ref 140–400)
RBC: 4.04 10*6/uL — ABNORMAL LOW (ref 4.20–5.80)
RDW: 13 % (ref 11.0–15.0)
Total Lymphocyte: 31.7 %
WBC: 6.7 10*3/uL (ref 3.8–10.8)

## 2019-02-21 LAB — PSA: PSA: <0.1 ng/mL (ref <=4.0)

## 2019-02-21 MED ORDER — IOHEXOL 300 MG/ML  SOLN: 100.0000 mL | Freq: Once | INTRAMUSCULAR | Status: DC | PRN

## 2019-02-23 ENCOUNTER — Other Ambulatory Visit: Payer: Self-pay

## 2019-02-23 ENCOUNTER — Other Ambulatory Visit: Payer: Self-pay | Admitting: Family Medicine

## 2019-02-23 ENCOUNTER — Ambulatory Visit (HOSPITAL_COMMUNITY)
Admission: RE | Admit: 2019-02-23 | Discharge: 2019-02-23 | Disposition: A | Discharge status: Home / Self Care | Payer: Medicare Other | Source: Ambulatory Visit | Attending: Family Medicine | Admitting: Family Medicine

## 2019-02-23 DIAGNOSIS — C799 Secondary malignant neoplasm of unspecified site: Secondary | ICD-10-CM | POA: Diagnosis not present

## 2019-02-23 DIAGNOSIS — C61 Malignant neoplasm of prostate: Secondary | ICD-10-CM | POA: Diagnosis present

## 2019-02-23 DIAGNOSIS — D3501 Benign neoplasm of right adrenal gland: Secondary | ICD-10-CM | POA: Diagnosis not present

## 2019-02-23 DIAGNOSIS — C770 Secondary and unspecified malignant neoplasm of lymph nodes of head, face and neck: Secondary | ICD-10-CM | POA: Diagnosis not present

## 2019-02-23 DIAGNOSIS — J439 Emphysema, unspecified: Secondary | ICD-10-CM | POA: Diagnosis not present

## 2019-02-23 MED ORDER — IOHEXOL 300 MG/ML  SOLN
100.0000 mL | Freq: Once | INTRAMUSCULAR | Status: AC | PRN
  Administered 2019-02-23: 100 mL via INTRAVENOUS

## 2019-02-23 NOTE — Telephone Encounter (Signed)
Ok to refill??  Last office visit 02/20/2019.  Last refill 01/23/2019.

## 2019-02-28 ENCOUNTER — Other Ambulatory Visit: Payer: Self-pay | Admitting: Family Medicine

## 2019-02-28 DIAGNOSIS — C61 Malignant neoplasm of prostate: Secondary | ICD-10-CM

## 2019-02-28 DIAGNOSIS — C770 Secondary and unspecified malignant neoplasm of lymph nodes of head, face and neck: Secondary | ICD-10-CM

## 2019-02-28 NOTE — Progress Notes (Signed)
Please see if this order is accepted, needs ASAP  If this is incorrect place okay to place the right order

## 2019-03-01 NOTE — Progress Notes (Signed)
Forgot to inform you that I did contact radiology. For this type of biopsy the radiologist has to review to determine when patient needs to be scheduled. Also patient is on plavix and will have to hold for 5 days prior to the biopsy. In epic it has this medication being prescribed by a historical provider but radiology informed me that they will have to contact the provider that prescribed the medication to get permission for him to hold.

## 2019-03-06 ENCOUNTER — Encounter (HOSPITAL_COMMUNITY): Payer: Self-pay

## 2019-03-06 NOTE — Progress Notes (Signed)
Cory Foley Male, 69 y.o., 1950-08-17 MRN:  [id number] Phone:  [phone] [name]) PCP:  Alycia Rossetti, MD Coverage:  [place] [facility] ([facility]) 03/15/2019 at 8:00 AM  RE: Biopsy/Blood thinners Received: Today Message Contents  [place], Modena Nunnery, MD  [facility] E      Yes, patient my hold the plavix for 5 days before the biopsy and the Aspirin day of the surgery.  Dr. Buelah Manis   Previous Messages  ----- Message -----  From: Lenore Cordia  Sent: 03/06/2019  3:12 PM EST  To: Alycia Rossetti, MD  Subject: Biopsy/Blood thinners               Hello Dr. Buelah Manis,   Your patient [name] is on Blood thinners (Plavix & Aspirin 81mg )  He will need to be off plavix for 5 days prior to his biopsy and hold the  Aspirin the day of.  Your permission is needed.   Thank you,  Adelei Scobey

## 2019-03-06 NOTE — Progress Notes (Signed)
Cory Foley Male, 69 y.o., 26-Nov-1950 MRN:  [id number] Phone:  [phone] Cory Foley) PCP:  Alycia Rossetti, MD Coverage:  [place] [facility] ([facility]) 03/15/2019 at 8:00 AM  RE: Biopsy Received: 2 days ago Message Contents  Markus Daft, MD  Ernestene Mention for US guided biopsy of left cervical node.   [place]   Previous Messages  ----- Message -----  From: Lenore Cordia  Sent: 03/03/2019  3:05 PM EST  To: Ir Procedure Requests  Subject: Biopsy                      Procedure Requested: US Guided Needle Placement    Reason for Procedure: abnormal lymph node in neck    Provider Requesting: Alycia Rossetti  Provider Telephone:  [phone]    Other Info: Rad exams in Epic

## 2019-03-14 ENCOUNTER — Other Ambulatory Visit: Payer: Self-pay | Admitting: Radiology

## 2019-03-15 ENCOUNTER — Ambulatory Visit (HOSPITAL_COMMUNITY)
Admission: RE | Admit: 2019-03-15 | Discharge: 2019-03-15 | Disposition: A | Discharge status: Home / Self Care | Payer: Medicare Other | Source: Ambulatory Visit | Attending: Family Medicine | Admitting: Family Medicine

## 2019-03-15 ENCOUNTER — Other Ambulatory Visit: Payer: Self-pay

## 2019-03-15 ENCOUNTER — Encounter (HOSPITAL_COMMUNITY): Payer: Self-pay

## 2019-03-15 DIAGNOSIS — K219 Gastro-esophageal reflux disease without esophagitis: Secondary | ICD-10-CM | POA: Insufficient documentation

## 2019-03-15 DIAGNOSIS — F17210 Nicotine dependence, cigarettes, uncomplicated: Secondary | ICD-10-CM | POA: Diagnosis not present

## 2019-03-15 DIAGNOSIS — Z885 Allergy status to narcotic agent status: Secondary | ICD-10-CM | POA: Diagnosis not present

## 2019-03-15 DIAGNOSIS — Z79899 Other long term (current) drug therapy: Secondary | ICD-10-CM | POA: Insufficient documentation

## 2019-03-15 DIAGNOSIS — C61 Malignant neoplasm of prostate: Secondary | ICD-10-CM

## 2019-03-15 DIAGNOSIS — I739 Peripheral vascular disease, unspecified: Secondary | ICD-10-CM | POA: Diagnosis not present

## 2019-03-15 DIAGNOSIS — C770 Secondary and unspecified malignant neoplasm of lymph nodes of head, face and neck: Secondary | ICD-10-CM | POA: Diagnosis not present

## 2019-03-15 DIAGNOSIS — Z7902 Long term (current) use of antithrombotics/antiplatelets: Secondary | ICD-10-CM | POA: Diagnosis not present

## 2019-03-15 DIAGNOSIS — Z800 Family history of malignant neoplasm of digestive organs: Secondary | ICD-10-CM | POA: Insufficient documentation

## 2019-03-15 DIAGNOSIS — M810 Age-related osteoporosis without current pathological fracture: Secondary | ICD-10-CM | POA: Insufficient documentation

## 2019-03-15 DIAGNOSIS — Z7982 Long term (current) use of aspirin: Secondary | ICD-10-CM | POA: Diagnosis not present

## 2019-03-15 DIAGNOSIS — E785 Hyperlipidemia, unspecified: Secondary | ICD-10-CM | POA: Diagnosis not present

## 2019-03-15 DIAGNOSIS — Z833 Family history of diabetes mellitus: Secondary | ICD-10-CM | POA: Diagnosis not present

## 2019-03-15 DIAGNOSIS — J449 Chronic obstructive pulmonary disease, unspecified: Secondary | ICD-10-CM | POA: Insufficient documentation

## 2019-03-15 DIAGNOSIS — Z8249 Family history of ischemic heart disease and other diseases of the circulatory system: Secondary | ICD-10-CM | POA: Diagnosis not present

## 2019-03-15 DIAGNOSIS — C771 Secondary and unspecified malignant neoplasm of intrathoracic lymph nodes: Secondary | ICD-10-CM | POA: Diagnosis not present

## 2019-03-15 DIAGNOSIS — R590 Localized enlarged lymph nodes: Secondary | ICD-10-CM | POA: Diagnosis not present

## 2019-03-15 DIAGNOSIS — Z8546 Personal history of malignant neoplasm of prostate: Secondary | ICD-10-CM | POA: Diagnosis not present

## 2019-03-15 MED ORDER — MIDAZOLAM HCL 2 MG/2ML IJ SOLN
INTRAMUSCULAR | Status: DC | PRN
  Administered 2019-03-15: 1 mg via INTRAVENOUS

## 2019-03-15 MED ORDER — MIDAZOLAM HCL 2 MG/2ML IJ SOLN
INTRAMUSCULAR | Status: AC
  Filled 2019-03-15: qty 2

## 2019-03-15 MED ORDER — LIDOCAINE HCL (PF) 1 % IJ SOLN
INTRAMUSCULAR | Status: AC
  Filled 2019-03-15: qty 30

## 2019-03-15 MED ORDER — FENTANYL CITRATE (PF) 100 MCG/2ML IJ SOLN
INTRAMUSCULAR | Status: DC | PRN
  Administered 2019-03-15: 50 ug via INTRAVENOUS

## 2019-03-15 MED ORDER — FENTANYL CITRATE (PF) 100 MCG/2ML IJ SOLN
INTRAMUSCULAR | Status: AC
  Filled 2019-03-15: qty 2

## 2019-03-15 NOTE — Discharge Instructions (Signed)
Needle Biopsy, Care After This sheet gives you information about how to care for yourself after your procedure. Your health care provider may also give you more specific instructions. If you have problems or questions, contact your health care provider. What can I expect after the procedure? After the procedure, it is common to have soreness, bruising, or mild pain at the puncture site. This should go away in a few days. Follow these instructions at home: Needle insertion site care   Wash your hands with soap and water before you change your bandage (dressing). If you cannot use soap and water, use hand sanitizer.  Follow instructions from your health care provider about how to take care of your puncture site. This includes: ? When and how to change your dressing. ? When to remove your dressing.  Check your puncture site every day for signs of infection. Check for: ? Redness, swelling, or pain. ? Fluid or blood. ? Pus or a bad smell. ? Warmth. General instructions  Return to your normal activities as told by your health care provider. Ask your health care provider what activities are safe for you.  Do not take baths, swim, or use a hot tub until your health care provider approves. Ask your health care provider if you may take showers. You may only be allowed to take sponge baths.  Take over-the-counter and prescription medicines only as told by your health care provider.  Keep all follow-up visits as told by your health care provider. This is important. Contact a health care provider if:  You have a fever.  You have redness, swelling, or pain at the puncture site that lasts longer than a few days.  You have fluid, blood, or pus coming from your puncture site.  Your puncture site feels warm to the touch. Get help right away if:  You have severe bleeding from the puncture site. Summary  After the procedure, it is common to have soreness, bruising, or mild pain at the puncture  site. This should go away in a few days.  Check your puncture site every day for signs of infection, such as redness, swelling, or pain.  Get help right away if you have severe bleeding from your puncture site. This information is not intended to replace advice given to you by your health care provider. Make sure you discuss any questions you have with your health care provider. Document Revised: 04/23/2017 Document Reviewed: 02/22/2017 Elsevier Patient Education  2020 Elsevier Inc.  

## 2019-03-15 NOTE — Procedures (Signed)
Interventional Radiology Procedure Note  Procedure: US Guided Biopsy of left cervical LN  Complications: None  Estimated Blood Loss: < 10 mL  Findings: 18 G core biopsy of left 2 cm level II cervical LN performed under US guidance.  Five core samples obtained and sent to Pathology.  Venetia Night. Kathlene Cote, M.D Pager:  [phone]

## 2019-03-15 NOTE — H&P (Signed)
Chief Complaint: Enlarged lymph node  Referring Physician(s): [id number]  Supervising Physician: Daryll Brod  Patient Status: [facility] - Out-pt  History of Present Illness: Cory Foley is a 69 y.o. male with a history of prostate cancer.  He is also a long time smoker.  He states he noticed an enlarged lymph node just under the left mandible about 3 months ago.  He states it seemed to "pop up over night".  He saw his PCP and was treated with antibiotics but the lymph node did not get smaller.  CT scan done 02/15/19 showed pathologic level 2 lymph node in the neck compatible with metastatic disease  He is here today for biopsy.  He is is NPO. He takes Plavix for arterial bypass graft. Last dose was 6 days ago.  No nausea/vomiting. No Fever/chills. ROS negative.   Past Medical History:  Diagnosis Date  . Allergy   . Anxiety   . Arthritis   . BPH with obstruction/lower urinary tract symptoms   . COPD, mild ([place])   . ED (erectile dysfunction)   . GERD (gastroesophageal reflux disease)   . Hyperlipidemia   . Nocturia   . Osteoporosis   . Prostate cancer ([place]) 01/11/2012   Adenocarcinoma, Gleason=4+4=8, & 4+5=9,PSA=17.24, Volume= 15.45cc  . PVD (peripheral vascular disease) ([place])     Past Surgical History:  Procedure Laterality Date  . ABDOMINAL AORTOGRAM W/LOWER EXTREMITY N/A 08/16/2018   Procedure: ABDOMINAL AORTOGRAM W/LOWER EXTREMITY;  Surgeon: Serafina Mitchell, MD;  Location: [place] [facility];  Service: Cardiovascular;  Laterality: N/A;  Bilateral  . COLONOSCOPY N/A 09/28/2012   Procedure: COLONOSCOPY;  Surgeon: Danie Binder, MD;  Location: [facility];  Service: Endoscopy;  Laterality: N/A;  8:30  . ESOPHAGOGASTRODUODENOSCOPY N/A 09/28/2012   Procedure: ESOPHAGOGASTRODUODENOSCOPY (EGD);  Surgeon: Danie Binder, MD;  Location: [facility];  Service: Endoscopy;  Laterality: N/A;  . Gold seed implatation  04/28/2012  . HERNIA REPAIR     Bilateral inguinal X2  . JOINT REPLACEMENT     bilateral  . KNEE SURGERY     Left Knee X 2   and Right knee X1  . MALONEY DILATION N/A 09/28/2012   Procedure: MALONEY DILATION;  Surgeon: Danie Binder, MD;  Location: [facility];  Service: Endoscopy;  Laterality: N/A;  . NOSE SURGERY    . PERIPHERAL VASCULAR INTERVENTION  08/16/2018   Procedure: PERIPHERAL VASCULAR INTERVENTION;  Surgeon: Serafina Mitchell, MD;  Location: [place] [facility];  Service: Cardiovascular;;  LT Iliac  . PR VEIN BYPASS GRAFT,AORTO-FEM-POP  10/21/10   Left AK to BK popliteal BPG  . PROSTATE BIOPSY  01/11/2012   Adenocarcinoma  . PROSTATE SURGERY  2015   Chemo and  Radiation  . SAVORY DILATION N/A 09/28/2012   Procedure: SAVORY DILATION;  Surgeon: Danie Binder, MD;  Location: [facility];  Service: Endoscopy;  Laterality: N/A;    Allergies: Codeine, Morphine and related, and Percocet [oxycodone-acetaminophen]  Medications: Prior to Admission medications   Medication Sig Start Date End Date Taking? Authorizing Provider  calcium carbonate (OS-CAL) 600 MG tablet Take 1 tablet (600 mg total) by mouth 2 (two) times daily with a meal. Patient taking differently: Take 600 mg by mouth daily.  07/26/17  Yes [place], Modena Nunnery, MD  cholecalciferol (VITAMIN D) 1000 units tablet Take 1 tablet (1,000 Units total) by mouth daily. 07/26/17  Yes [place], Modena Nunnery, MD  cyclobenzaprine (FLEXERIL) 5 MG tablet  Take 10 mg by mouth at bedtime.   Yes [provider]  famotidine (PEPCID) 20 MG tablet TAKE 1 TABLET BY MOUTH AT BEDTIME. 09/13/18  Yes Susy Frizzle, MD  HYDROcodone-acetaminophen (NORCO) 10-325 MG tablet TAKE 1 TABLET BY MOUTH EVERY FOUR HOURS AS NEEDED. 02/23/19  Yes [place], Modena Nunnery, MD  mirtazapine (REMERON) 15 MG tablet TAKE 1 TABLET BY MOUTH AT BEDTIME. 12/06/18  Yes [place], Modena Nunnery, MD  pantoprazole (PROTONIX) 40 MG tablet TAKE ONE TABLET BY MOUTH DAILY. 10/17/18  Yes [place], Modena Nunnery, MD  rOPINIRole  (REQUIP) 3 MG tablet TAKE (1) TABLET BY MOUTH AT BEDTIME. 02/28/19  Yes [place], Modena Nunnery, MD  simvastatin (ZOCOR) 40 MG tablet TAKE (1) TABLET BY MOUTH AT BEDTIME. 12/13/18  Yes [place], Modena Nunnery, MD  topiramate (TOPAMAX) 25 MG tablet TAKE 3 TABLETS BY MOUTH DAILY. 02/03/19  Yes , Modena Nunnery, MD  aspirin EC 81 MG tablet Take 81 mg by mouth daily.    [provider]  clopidogrel (PLAVIX) 75 MG tablet Take 1 tablet by mouth daily. x3 months 08/16/18   [provider]  methocarbamol (ROBAXIN) 500 MG tablet Take 500 mg by mouth 4 (four) times daily as needed (pain.).     [provider]     Family History  Problem Relation Age of Onset  . Heart disease Mother        Valve regurgitation and Pacemaker   . Diabetes Mother   . Hyperlipidemia Mother   . Heart disease Father        CABG x 5  . Hyperlipidemia Father   . Hypertension Father   . Heart attack Father   . Heart disease Sister        aortic valve replacement  . Cancer Sister 54       Colon cancer w/ metastasis  . Hypertension Brother     Social History   Socioeconomic History  . Marital status: Married    Spouse name: Holley Raring  . Number of children: 2  . Years of education: College  . 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 Smoking status: Current Every Day Smoker    Packs/day: 0.50    Years: 43.00    Pack years: 21.50    Types: Cigarettes  . Smokeless tobacco: Never Used  . Tobacco comment: 1/2 ppd  Substance and Sexual Activity  . Alcohol use: No    Comment: quit 1.5 years ago  . Drug use: No  . Sexual activity: Not Currently  Other Topics Concern  . Not on file  Social History Narrative   Patient lives at home with spouse.   Caffeine use: very little   Social Determinants of Radio broadcast assistant Strain:   . Difficulty of Paying Living  Expenses: Not on file  Food Insecurity:   . Worried About Charity fundraiser in the Last Year: Not on file  . Ran Out of Food in the Last Year: Not on file  Transportation Needs:   . Lack of Transportation (Medical): Not on file  . Lack of Transportation (Non-Medical): Not on file  Physical Activity:   . Days of Exercise per Week: Not on file  . Minutes of Exercise per Session: Not on file  Stress:   . Feeling of Stress : Not on  file  Social Connections:   . Frequency of Communication with Friends and Family: Not on file  . Frequency of Social Gatherings with Friends and Family: Not on file  . Attends Religious Services: Not on file  . Active Member of Clubs or Organizations: Not on file  . Attends Archivist Meetings: Not on file  . Marital Status: Not on file     Review of Systems: A 12 point ROS discussed and pertinent positives are indicated in the HPI above.  All other systems are negative.  Review of Systems  Vital Signs: BP 120/75   Pulse (!) 59   Temp 98 F (36.7 C) (Oral)   Resp 18   Ht 5\' 8"  (1.727 m)   Wt 76.2 kg   SpO2 100%   BMI 25.54 kg/m   Physical Exam Vitals reviewed.  Constitutional:      Appearance: Normal appearance.  HENT:     Head: Normocephalic and atraumatic.  Eyes:     Extraocular Movements: Extraocular movements intact.  Neck:      Comments: Palpable firm lymph node Cardiovascular:     Rate and Rhythm: Normal rate and regular rhythm.  Pulmonary:     Effort: Pulmonary effort is normal. No respiratory distress.     Breath sounds: Normal breath sounds.  Abdominal:     General: There is no distension.     Palpations: Abdomen is soft.     Tenderness: There is no abdominal tenderness.  Musculoskeletal:        General: Normal range of motion.     Cervical back: Normal range of motion.  Skin:    General: Skin is warm and dry.  Neurological:     General: No focal deficit present.     Mental Status: He is alert and oriented  to person, place, and time.  Psychiatric:        Mood and Affect: Mood normal.        Behavior: Behavior normal.        Thought Content: Thought content normal.        Judgment: Judgment normal.     Imaging: CT Soft Tissue Neck W Contrast  Result Date: 02/15/2019 CLINICAL DATA:  Neck mass.  History prostate cancer. EXAM: CT NECK WITH CONTRAST TECHNIQUE: Multidetector CT imaging of the neck was performed using the standard protocol following the bolus administration of intravenous contrast. CONTRAST:  41mL OMNIPAQUE IOHEXOL 300 MG/ML  SOLN COMPARISON:  None. FINDINGS: Pharynx and larynx: Mild enlargement of the tonsils with heterogeneous density bilaterally. Irregular soft tissue thickening and enhancement left lateral tongue base. Cannot exclude malignancy. Epiglottis and larynx normal. Salivary glands: No inflammation, mass, or stone. Thyroid: Negative Lymph nodes: Left level 2 lymph node measuring 19 x 17 mm with cystic change and necrosis compatible with malignancy. No other enlarged or pathologic lymph nodes in the neck. Vascular: Normal vascular enhancement. Atherosclerotic calcification carotid bifurcation bilaterally. Limited intracranial: Negative Visualized orbits: Negative Mastoids and visualized paranasal sinuses: Negative Skeleton: Advanced cervical spondylosis with disc degeneration and spurring throughout the cervical spine. No acute skeletal abnormality. Upper chest: Lung apices clear bilaterally Other: None IMPRESSION: Pathologic level 2 lymph node in the neck compatible with metastatic disease. Biopsy recommended. Possible mucosal lesion left lateral tongue base. Electronically Signed   By: Franchot Gallo M.D.   On: 02/15/2019 10:21   CT Chest W Contrast  Result Date: 02/23/2019 CLINICAL DATA:  Evaluate for primary and metastatic disease, pathologic cervical lymph node identified on  recent CT, history of prostate cancer EXAM: CT CHEST, ABDOMEN, AND PELVIS WITH CONTRAST TECHNIQUE:  Multidetector CT imaging of the chest, abdomen and pelvis was performed following the standard protocol during bolus administration of intravenous contrast. CONTRAST:  125mL OMNIPAQUE IOHEXOL 300 MG/ML SOLN, additional oral enteric contrast COMPARISON:  CT neck, 02/15/2019, CT abdomen pelvis, 01/14/2015 FINDINGS: CT CHEST FINDINGS Cardiovascular: Scattered aortic atherosclerosis. Normal heart size. Three-vessel coronary artery calcifications. No pericardial effusion. Mediastinum/Nodes: No enlarged mediastinal, hilar, or axillary lymph nodes. Thyroid gland, trachea, and esophagus demonstrate no significant findings. Lungs/Pleura: Mild paraseptal emphysema. Numerous tiny centrilobular pulmonary nodules, most concentrated in the lung apices. There is a partially calcified 4 mm pulmonary nodule of the left lower lobe, included on prior CT of the abdomen and pelvis, benign (series 4, image 99). No pleural effusion or pneumothorax. Musculoskeletal: No chest wall mass or suspicious bone lesions identified. CT ABDOMEN PELVIS FINDINGS Hepatobiliary: No solid liver abnormality is seen. No gallstones, gallbladder wall thickening, or biliary dilatation. Pancreas: Unremarkable. No pancreatic ductal dilatation or surrounding inflammatory changes. Spleen: Normal in size without significant abnormality. Adrenals/Urinary Tract: Stable, benign, 2.3 cm right adrenal adenoma. Kidneys are normal, without renal calculi, solid lesion, or hydronephrosis. Bladder is unremarkable. Stomach/Bowel: Stomach is within normal limits. Appendix appears normal. No evidence of bowel wall thickening, distention, or inflammatory changes. Vascular/Lymphatic: Aortic atherosclerosis. Left common iliac artery stent. Incidental note of retroaortic left renal vein. No enlarged abdominal or pelvic lymph nodes. Reproductive: No mass or other abnormality. Other: No abdominal wall hernia or abnormality. No abdominopelvic ascites. Musculoskeletal: No acute or  significant osseous findings. IMPRESSION: 1. No evidence of primary malignancy, additional lymphadenopathy, or metastatic disease in the chest, abdomen, or pelvis to correspond to suspicious left cervical lymph node seen on recent CT. 2. Numerous tiny centrilobular pulmonary nodules, most concentrated in the lung apices, likely reflecting smoking-related respiratory bronchiolitis. 3. Mild emphysema.  Emphysema (ICD10-J43.9). 4. Stable benign right adrenal adenoma. 5. Coronary artery disease.  Aortic Atherosclerosis (ICD10-I70.0). Electronically Signed   By: Eddie Candle M.D.   On: 02/23/2019 09:53   CT Abdomen Pelvis W Contrast  Result Date: 02/23/2019 CLINICAL DATA:  Evaluate for primary and metastatic disease, pathologic cervical lymph node identified on recent CT, history of prostate cancer EXAM: CT CHEST, ABDOMEN, AND PELVIS WITH CONTRAST TECHNIQUE: Multidetector CT imaging of the chest, abdomen and pelvis was performed following the standard protocol during bolus administration of intravenous contrast. CONTRAST:  132mL OMNIPAQUE IOHEXOL 300 MG/ML SOLN, additional oral enteric contrast COMPARISON:  CT neck, 02/15/2019, CT abdomen pelvis, 01/14/2015 FINDINGS: CT CHEST FINDINGS Cardiovascular: Scattered aortic atherosclerosis. Normal heart size. Three-vessel coronary artery calcifications. No pericardial effusion. Mediastinum/Nodes: No enlarged mediastinal, hilar, or axillary lymph nodes. Thyroid gland, trachea, and esophagus demonstrate no significant findings. Lungs/Pleura: Mild paraseptal emphysema. Numerous tiny centrilobular pulmonary nodules, most concentrated in the lung apices. There is a partially calcified 4 mm pulmonary nodule of the left lower lobe, included on prior CT of the abdomen and pelvis, benign (series 4, image 99). No pleural effusion or pneumothorax. Musculoskeletal: No chest wall mass or suspicious bone lesions identified. CT ABDOMEN PELVIS FINDINGS Hepatobiliary: No solid liver  abnormality is seen. No gallstones, gallbladder wall thickening, or biliary dilatation. Pancreas: Unremarkable. No pancreatic ductal dilatation or surrounding inflammatory changes. Spleen: Normal in size without significant abnormality. Adrenals/Urinary Tract: Stable, benign, 2.3 cm right adrenal adenoma. Kidneys are normal, without renal calculi, solid lesion, or hydronephrosis. Bladder is unremarkable. Stomach/Bowel: Stomach is within normal limits. Appendix appears  normal. No evidence of bowel wall thickening, distention, or inflammatory changes. Vascular/Lymphatic: Aortic atherosclerosis. Left common iliac artery stent. Incidental note of retroaortic left renal vein. No enlarged abdominal or pelvic lymph nodes. Reproductive: No mass or other abnormality. Other: No abdominal wall hernia or abnormality. No abdominopelvic ascites. Musculoskeletal: No acute or significant osseous findings. IMPRESSION: 1. No evidence of primary malignancy, additional lymphadenopathy, or metastatic disease in the chest, abdomen, or pelvis to correspond to suspicious left cervical lymph node seen on recent CT. 2. Numerous tiny centrilobular pulmonary nodules, most concentrated in the lung apices, likely reflecting smoking-related respiratory bronchiolitis. 3. Mild emphysema.  Emphysema (ICD10-J43.9). 4. Stable benign right adrenal adenoma. 5. Coronary artery disease.  Aortic Atherosclerosis (ICD10-I70.0). Electronically Signed   By: Eddie Candle M.D.   On: 02/23/2019 09:53    Labs:  CBC: Recent Labs    07/13/18 1040 07/13/18 1040 07/21/18 0915 08/16/18 0638 12/26/18 1015 02/20/19 1548  WBC 11.2*  --  6.8  --  5.9 6.7  HGB 12.9*   < > 12.9* 13.6 12.7* 12.8*  HCT 40.0   < > 38.6 40.0 37.6* 38.5  PLT 204  --  170  --  208 209   < > = values in this interval not displayed.    COAGS: No results for input(s): INR, APTT in the last 8760 hours.  BMP: Recent Labs    04/27/18 0852 04/27/18 0852 07/13/18 1040  08/16/18 0638 12/26/18 1015 02/15/19 0815 02/20/19 1548  NA 141   < > 141 141 141  --  141  K 4.6   < > 4.9 4.6 4.4  --  4.1  CL 107   < > 106 111 112*  --  108  CO2 29  --  23  --  22  --  24  GLUCOSE 82   < > 90 97 85  --  72  BUN 8   < > 11 9 12   --  11  CALCIUM 9.5  --  9.8  --  9.6  --  9.2  CREATININE 0.80   < > 0.82 0.90 1.05 0.90 0.97   < > = values in this interval not displayed.    LIVER FUNCTION TESTS: Recent Labs    04/27/18 0852 07/13/18 1040 12/26/18 1015 02/20/19 1548  BILITOT 0.4 0.3 0.3 0.3  AST 12 12 12 13   ALT 8* 8* 9 8*  PROT 6.4 6.7 6.5 6.3    TUMOR MARKERS: No results for input(s): AFPTM, CEA, CA199, CHROMGRNA in the last 8760 hours.  Assessment and Plan:  Long time smoker and history of prostate cancer now with pathologic level 2 lymph node in the neck compatible with metastatic disease.  Will proceed with image guided biopsy by Dr. Annamaria Boots.  Risks and benefits of left neck lymph node biopsy was discussed with the patient and/or patient's family including, but not limited to bleeding, infection, damage to adjacent structures or low yield requiring additional tests.  All of the questions were answered and there is agreement to proceed.  Consent signed and in chart.  Thank you for this interesting consult.  I greatly enjoyed meeting Cory Foley and look forward to participating in their care.  A copy of this report was sent to the requesting provider on this date.  Electronically Signed: Murrell Redden, PA-C   03/15/2019, 7:13 AM      I spent a total of  30 Minutes in face to face in clinical consultation, greater  than 50% of which was counseling/coordinating care for lymph node biopsy.

## 2019-03-17 ENCOUNTER — Telehealth: Payer: Self-pay | Admitting: Family Medicine

## 2019-03-17 DIAGNOSIS — K148 Other diseases of tongue: Secondary | ICD-10-CM

## 2019-03-17 DIAGNOSIS — C799 Secondary malignant neoplasm of unspecified site: Secondary | ICD-10-CM

## 2019-03-17 DIAGNOSIS — IMO0002 Reserved for concepts with insufficient information to code with codable children: Secondary | ICD-10-CM

## 2019-03-17 LAB — SURGICAL PATHOLOGY

## 2019-03-17 NOTE — Telephone Encounter (Signed)
Called received from pathologist Pt has metastatic squamous cell carcinoma.  They think that this is likely originating from the lesion in his mouth on the base of the tongue.  He also states that he has a dark mole on his upper back as well that has been present for years.  We will get an appointment with his dentist or oral surgeon will see who is available for [place] to look at the tongue lesion to see if this needs to be biopsied.  We will also get oncology involved.  We will have him come in on Monday so that I can look at the scan and do a shave biopsy on any questionable moles that may be squamous cell carcinoma

## 2019-03-20 ENCOUNTER — Encounter: Payer: Self-pay | Admitting: Family Medicine

## 2019-03-20 ENCOUNTER — Ambulatory Visit (INDEPENDENT_AMBULATORY_CARE_PROVIDER_SITE_OTHER): Payer: Medicare Other | Admitting: Family Medicine

## 2019-03-20 ENCOUNTER — Other Ambulatory Visit: Payer: Self-pay

## 2019-03-20 ENCOUNTER — Encounter (HOSPITAL_COMMUNITY): Payer: Self-pay

## 2019-03-20 VITALS — BP 112/70 | HR 74 | Temp 98.0°F | Resp 16 | Ht 68.0 in | Wt 171.5 lb

## 2019-03-20 DIAGNOSIS — D229 Melanocytic nevi, unspecified: Secondary | ICD-10-CM | POA: Diagnosis not present

## 2019-03-20 DIAGNOSIS — IMO0002 Reserved for concepts with insufficient information to code with codable children: Secondary | ICD-10-CM

## 2019-03-20 DIAGNOSIS — C799 Secondary malignant neoplasm of unspecified site: Secondary | ICD-10-CM

## 2019-03-20 DIAGNOSIS — L820 Inflamed seborrheic keratosis: Secondary | ICD-10-CM | POA: Diagnosis not present

## 2019-03-20 HISTORY — PX: OTHER SURGICAL HISTORY: SHX169

## 2019-03-20 NOTE — Patient Instructions (Signed)
F/U pending results   

## 2019-03-20 NOTE — Progress Notes (Signed)
   Subjective:    Patient ID: Cory Foley, male    DOB: [birth year], 69 y.o.   MRN: [id number]  Patient presents for Follow-up   Patient here for mole removal.  He has had a mole on his right upper back for some years but it has been changing in color and size.  He had a recent lymph node biopsy which showed metastatic squamous cell carcinoma.  There was concern was possibly from a lateral left tongue lesion but we do not see anything in his mouth he does not have any pain.  We are getting him scheduled with a dentist to evaluate that.  He also has appointment with oncology tomorrow.  Brought in a biopsy this mole today to make sure there is no sign of cancer.  Review Of Systems:  GEN- denies fatigue, fever, weight loss,weakness, recent illness HEENT- denies eye drainage, change in vision, nasal discharge, CVS- denies chest pain, palpitations RESP- denies SOB, cough, wheeze ABD- denies N/V, change in stools, abd pain GU- denies dysuria, hematuria, dribbling, incontinence MSK- denies joint pain, muscle aches, injury Neuro- denies headache, dizziness, syncope, seizure activity       Objective:    BP 112/70   Pulse 74   Temp 98 F (36.7 C) (Temporal)   Resp 16   Ht 5\' 8"  (1.727 m)   Wt 171 lb 8 oz (77.8 kg)   SpO2 96%   BMI 26.08 kg/m  GEN- NAD, alert and oriented x3 HEENT- PERRL, EOMI, non injected sclera, pink conjunctiva, MMM, oropharynx clear- lateral tongue vasculature seen, no specific lesion Neck- Supple, left ant cervical node, TTP CVS- RRR, no murmur RESP-CTAB Skin- left upper back hyperigmented raised nevus, NT    Procedure- Shave biopsy  Procedure explained to patient questions answered benefits and risks discussed verbal consent obtained. Antiseptic-betadine  Anesthesia-lidocaine 1% with Epi  Blade used to shave entire nevus Minimal blood loss, drysol  touched to lesion  Patient tolerated procedure well Bandage applied with Triple antibiotic ointment       Assessment & Plan:      Problem List Items Addressed This Visit      Unprioritized   Metastatic squamous cell carcinoma ([place])    Other Visit Diagnoses    Atypical mole    -  Primary   s/p shave biopsy, f/u pathology report   Relevant Orders   Pathology Report ([facility])      Note: This dictation was prepared with Dragon dictation along with smaller phrase technology. Any transcriptional errors that result from this process are unintentional.

## 2019-03-21 ENCOUNTER — Inpatient Hospital Stay (HOSPITAL_COMMUNITY): Payer: Medicare Other | Attending: Hematology | Admitting: Hematology

## 2019-03-21 ENCOUNTER — Encounter (HOSPITAL_COMMUNITY): Payer: Self-pay | Admitting: Hematology

## 2019-03-21 VITALS — BP 111/71 | HR 65 | Temp 97.1°F | Resp 18 | Ht 68.0 in | Wt 169.0 lb

## 2019-03-21 DIAGNOSIS — Z7982 Long term (current) use of aspirin: Secondary | ICD-10-CM | POA: Diagnosis not present

## 2019-03-21 DIAGNOSIS — C799 Secondary malignant neoplasm of unspecified site: Secondary | ICD-10-CM

## 2019-03-21 DIAGNOSIS — C01 Malignant neoplasm of base of tongue: Secondary | ICD-10-CM | POA: Diagnosis not present

## 2019-03-21 DIAGNOSIS — K219 Gastro-esophageal reflux disease without esophagitis: Secondary | ICD-10-CM | POA: Insufficient documentation

## 2019-03-21 DIAGNOSIS — Z79899 Other long term (current) drug therapy: Secondary | ICD-10-CM | POA: Diagnosis not present

## 2019-03-21 DIAGNOSIS — I739 Peripheral vascular disease, unspecified: Secondary | ICD-10-CM | POA: Insufficient documentation

## 2019-03-21 DIAGNOSIS — Z923 Personal history of irradiation: Secondary | ICD-10-CM | POA: Diagnosis not present

## 2019-03-21 DIAGNOSIS — IMO0002 Reserved for concepts with insufficient information to code with codable children: Secondary | ICD-10-CM

## 2019-03-21 DIAGNOSIS — R519 Headache, unspecified: Secondary | ICD-10-CM | POA: Diagnosis not present

## 2019-03-21 DIAGNOSIS — Z800 Family history of malignant neoplasm of digestive organs: Secondary | ICD-10-CM | POA: Diagnosis not present

## 2019-03-21 DIAGNOSIS — Z8349 Family history of other endocrine, nutritional and metabolic diseases: Secondary | ICD-10-CM | POA: Insufficient documentation

## 2019-03-21 DIAGNOSIS — F17210 Nicotine dependence, cigarettes, uncomplicated: Secondary | ICD-10-CM | POA: Diagnosis not present

## 2019-03-21 DIAGNOSIS — E785 Hyperlipidemia, unspecified: Secondary | ICD-10-CM | POA: Diagnosis not present

## 2019-03-21 DIAGNOSIS — Z8249 Family history of ischemic heart disease and other diseases of the circulatory system: Secondary | ICD-10-CM | POA: Insufficient documentation

## 2019-03-21 DIAGNOSIS — Z8546 Personal history of malignant neoplasm of prostate: Secondary | ICD-10-CM | POA: Insufficient documentation

## 2019-03-21 DIAGNOSIS — J449 Chronic obstructive pulmonary disease, unspecified: Secondary | ICD-10-CM | POA: Diagnosis not present

## 2019-03-21 DIAGNOSIS — Z833 Family history of diabetes mellitus: Secondary | ICD-10-CM

## 2019-03-21 DIAGNOSIS — F419 Anxiety disorder, unspecified: Secondary | ICD-10-CM | POA: Insufficient documentation

## 2019-03-21 DIAGNOSIS — C779 Secondary and unspecified malignant neoplasm of lymph node, unspecified: Secondary | ICD-10-CM | POA: Insufficient documentation

## 2019-03-21 NOTE — Patient Instructions (Addendum)
[place] at [facility] Discharge Instructions  You were seen today by Dr. Delton Coombes. He went over your history, family history and how you've been feeling lately. He will schedule you for a PET scan for further evaluation. He will also refer you to Dr. Benjamine Mola a local ENT . He will see you back after your scan for follow up.   Thank you for choosing [place] at [facility] to provide your oncology and hematology care.  To afford each patient quality time with our provider, please arrive at least 15 minutes before your scheduled appointment time.   If you have a lab appointment with the [facility] please come in thru the  Main Entrance and check in at the main information desk  You need to re-schedule your appointment should you arrive 10 or more minutes late.  We strive to give you quality time with our providers, and arriving late affects you and other patients whose appointments are after yours.  Also, if you no show three or more times for appointments you may be dismissed from the clinic at the providers discretion.     Again, thank you for choosing [facility].  Our hope is that these requests will decrease the amount of time that you wait before being seen by our physicians.       _____________________________________________________________  Should you have questions after your visit to [facility], please contact our office at [phone] between the hours of 8:00 a.m. and 4:30 p.m.  Voicemails left after 4:00 p.m. will not be returned until the following business day.  For prescription refill requests, have your pharmacy contact our office and allow 72 hours.    Cancer Center Support Programs:   > Cancer Support Group  2nd Tuesday of the month 1pm-2pm, Journey Room

## 2019-03-21 NOTE — Assessment & Plan Note (Signed)
1.  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 -1 pack/day, 50 years current active smoker -CT soft tissue neck on 02/15/2019 shows irregular soft tissue thickening and enhancement of the left lateral tongue base with left level 2 lymph node measuring 19 x 17 mm. -CT CAP on 02/23/2019 did not show any malignancy. -Left cervical lymph node biopsy on 03/15/2019 consistent with metastatic squamous cell carcinoma, p16 positive. -We talked about the biopsy results in detail.  He needs ENT evaluation with examination and biopsy.  We will make a referral to Dr. Benjamine Mola. -I will also obtain a PET CT scan as part of work-up.  I will see him back after the biopsy and PET scan.  2.  Headaches in the evenings: -Reported headaches which started in the early 2020. -MRI of the brain on 09/20/2018 without contrast showed no acute intracranial abnormality.  Stable appearance since 2015.  3.  History of prostate cancer: -Reported Gleason 9 prostate cancer, diagnosed in November 2013. -Status post XRT from April 2014 through May 2014 with 2 years of Lupron. -He reportedly has PSAs checked periodically and most recent one was good.

## 2019-03-21 NOTE — Progress Notes (Signed)
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 male is seen in consultation today at the request of Dr. 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  Patient reported noticing left neck lymph node in late October 2020.  He also reported that sometimes it hurts in the left side of the throat when he swallows solid foods like meats.  He denies any weight loss.  A CT scan of the neck was done on 02/15/2019 which showed irregular soft tissue thickening and enhancement of the left lateral tongue base with left level 2 lymph node.  CT CAP on 02/23/2019 did not show any active malignancy.  Left cervical lymph node biopsy on 03/15/2019 was consistent with metastatic squamous cell carcinoma, p16 positive.  He has chronic headaches mainly in the evenings.  He had a MRI of the brain without contrast in July 2020 which was negative for any acute process.  He is a current active smoker, smokes approximately 1 pack/day for 50 years.  He also reported history of prostate cancer diagnosed in November 2013, Gleason 9.  He underwent radiation therapy completed in May 2014.  He was also on ADT for 2 years.  He reportedly had a normal PSA recently.  He is accompanied by his wife today.  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  Family history significant for sister with throat cancer and colon cancer.  Paternal grandfather had lung cancer.  Denies any tingling or numbness in the extremities.  Denies any hearing loss.  He is retired and primary caregiver for his father who is in his 28s.  MEDICAL HISTORY:  Past Medical  History:  Diagnosis Date  . Allergy   . Anxiety   . Arthritis   . BPH with obstruction/lower urinary tract symptoms   . COPD, mild ([place])   . ED (erectile dysfunction)   . GERD (gastroesophageal reflux disease)   . Hyperlipidemia   . Nocturia   . Osteoporosis   . PAD (peripheral artery disease) ([place])   . Prostate cancer ([place]) 01/11/2012   Adenocarcinoma, Gleason=4+4=8, & 4+5=9,PSA=17.24, Volume= 15.45cc  . PVD (peripheral vascular disease) ([place])   . Restless leg syndrome     SURGICAL HISTORY: Past Surgical History:  Procedure Laterality Date  . ABDOMINAL AORTOGRAM W/LOWER EXTREMITY N/A 08/16/2018   Procedure: ABDOMINAL AORTOGRAM W/LOWER EXTREMITY;  Surgeon: Serafina Mitchell, MD;  Location: [place] CV LAB;  Service: Cardiovascular;  Laterality: N/A;  Bilateral  . COLONOSCOPY N/A 09/28/2012   Procedure: COLONOSCOPY;  Surgeon: Danie Binder, MD;  [facility];  Service: Endoscopy;  Laterality: N/A;  8:30  . ESOPHAGOGASTRODUODENOSCOPY N/A 09/28/2012   Procedure: ESOPHAGOGASTRODUODENOSCOPY (EGD);  Surgeon: Danie Binder, MD;  [facility];  Service: Endoscopy;  Laterality: N/A;  . Gold seed implatation  04/28/2012  . HERNIA REPAIR     Bilateral inguinal X2  . JOINT REPLACEMENT     bilateral  . KNEE SURGERY     Left Knee X 2   and Right knee X1  . MALONEY DILATION N/A 09/28/2012   Procedure: Venia Minks DILATION;  Surgeon: Danie Binder, MD;  [facility]  [facility];  Service: Endoscopy;  Laterality: N/A;  . NOSE SURGERY    . PERIPHERAL VASCULAR INTERVENTION  08/16/2018   Procedure: PERIPHERAL VASCULAR INTERVENTION;  Surgeon: Serafina Mitchell, MD;  Location: [place] CV LAB;  Service: Cardiovascular;;  LT Iliac  . PR VEIN BYPASS GRAFT,AORTO-FEM-POP  10/21/10   Left AK to BK popliteal BPG  . PROSTATE BIOPSY  01/11/2012   Adenocarcinoma  . PROSTATE SURGERY  2015   Chemo and  Radiation  . SAVORY DILATION N/A 09/28/2012   Procedure: SAVORY DILATION;  Surgeon:  Danie Binder, MD;  [facility];  Service: Endoscopy;  Laterality: N/A;  . throat biopsy  03/20/2019    SOCIAL HISTORY: Social History   Socioeconomic History  . Marital status: Married    Spouse name: Holley Raring  . Number of children: 2  . Years of education: College  . 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 Smoking status: Current Every Day Smoker    Packs/day: 0.50    Years: 43.00    Pack years: 21.50    Types: Cigarettes  . Smokeless tobacco: Never Used  . Tobacco comment: 1/2 ppd  Substance and Sexual Activity  . Alcohol use: No    Comment: quit 1.5 years ago  . Drug use: No  . Sexual activity: Not Currently  Other Topics Concern  . Not on file  Social History Narrative   Patient lives at home with spouse.   Caffeine use: very little   Social Determinants of Health   Financial Resource Strain: Low Risk   . Difficulty of Paying Living Expenses: Not hard at all  Food Insecurity: No Food Insecurity  . Worried About Charity fundraiser in the Last Year: Never true  . Ran Out of Food in the Last Year: Never true  Transportation Needs: No Transportation Needs  . Lack of Transportation (Medical): No  . Lack of Transportation (Non-Medical): No  Physical Activity: Sufficiently Active  . Days of Exercise per Week: 5 days  . Minutes of Exercise per Session: 30 min  Stress: Stress Concern Present  . Feeling of Stress : Rather much  Social Connections: Slightly Isolated  . Frequency of Communication with Friends and Family: Twice a week  . Frequency of Social Gatherings with Friends and Family: Twice a week  . Attends Religious Services: More than 4 times per year  . Active Member of Clubs or Organizations: No  . Attends Archivist Meetings: Never  . Marital Status: Married  Human resources officer Violence: Not At Risk  .  Fear of Current or Ex-Partner: No  . Emotionally Abused: No  . Physically Abused: No  . Sexually Abused: No    FAMILY HISTORY: Family History  Problem Relation Age of Onset  . Heart disease Mother        Valve regurgitation and Pacemaker   . Diabetes Mother   . Hyperlipidemia Mother   . Heart disease Father        CABG x 5  . Hyperlipidemia Father   . Hypertension Father   . Heart attack Father   . Heart disease Sister        aortic valve replacement  . Cancer Sister 51       Colon cancer w/ metastasis  . Throat cancer Sister   .  Hypertension Brother   . Heart disease Maternal Grandmother   . Heart disease Maternal Grandfather   . Heart disease Paternal Grandmother   . Cancer Paternal Grandfather   . Healthy Son   . Healthy Daughter     ALLERGIES:  is allergic to codeine; morphine and related; and percocet [oxycodone-acetaminophen].  MEDICATIONS:  Current Outpatient Medications  Medication Sig Dispense Refill  . aspirin EC 81 MG tablet Take 81 mg by mouth daily.    . calcium carbonate (OS-CAL) 600 MG tablet Take 1 tablet (600 mg total) by mouth 2 (two) times daily with a meal. (Patient taking differently: Take 600 mg by mouth daily. ) 30 tablet   . cholecalciferol (VITAMIN D) 1000 units tablet Take 1 tablet (1,000 Units total) by mouth daily.    . clopidogrel (PLAVIX) 75 MG tablet Take 1 tablet by mouth daily. x3 months    . cyclobenzaprine (FLEXERIL) 5 MG tablet Take 10 mg by mouth at bedtime.    . famotidine (PEPCID) 20 MG tablet TAKE 1 TABLET BY MOUTH AT BEDTIME. 90 tablet 3  . mirtazapine (REMERON) 15 MG tablet TAKE 1 TABLET BY MOUTH AT BEDTIME. 30 tablet 5  . pantoprazole (PROTONIX) 40 MG tablet TAKE ONE TABLET BY MOUTH DAILY. 90 tablet 0  . rOPINIRole (REQUIP) 3 MG tablet TAKE (1) TABLET BY MOUTH AT BEDTIME. 30 tablet 0  . simvastatin (ZOCOR) 40 MG tablet TAKE (1) TABLET BY MOUTH AT BEDTIME. 90 tablet 2  . topiramate (TOPAMAX) 25 MG tablet TAKE 3 TABLETS BY MOUTH  DAILY. 90 tablet 3  . HYDROcodone-acetaminophen (NORCO) 10-325 MG tablet TAKE 1 TABLET BY MOUTH EVERY FOUR HOURS AS NEEDED. (Patient not taking: Reported on 03/20/2019) 120 tablet 0  . methocarbamol (ROBAXIN) 500 MG tablet Take 500 mg by mouth 4 (four) times daily as needed (pain.).      No current facility-administered medications for this visit.    REVIEW OF SYSTEMS:   Constitutional: Denies fevers, chills or abnormal night sweats positive for headaches. Eyes: Denies blurriness of vision, double vision or watery eyes Ears, nose, mouth, throat, and face: Denies mucositis or sore throat Respiratory: Denies cough, dyspnea or wheezes Cardiovascular: Denies palpitation, chest discomfort or lower extremity swelling Gastrointestinal:  Denies nausea, heartburn or change in bowel habits Skin: Denies abnormal skin rashes Lymphatics: Denies new lymphadenopathy or easy bruising Neurological:Denies numbness, tingling or new weaknesses Behavioral/Psych: Mood is stable, no new changes  All other systems were reviewed with the patient and are negative.  PHYSICAL EXAMINATION: ECOG PERFORMANCE STATUS: 1 - Symptomatic but completely ambulatory  Vitals:   03/21/19 1344  BP: 111/71  Pulse: 65  Resp: 18  Temp: (!) 97.1 F (36.2 C)  SpO2: 100%   Filed Weights   03/21/19 1344  Weight: 169 lb (76.7 kg)    GENERAL:alert, no distress and comfortable SKIN: skin color, texture, turgor are normal, no rashes or significant lesions EYES: normal, conjunctiva are pink and non-injected, sclera clear OROPHARYNX:no exudate, no erythema and lips, buccal mucosa, and tongue normal  NECK: Left neck level 2 lymph node measuring 2 x 3 cm. LYMPH:  no palpable lymphadenopathy in the cervical, axillary or inguinal LUNGS: clear to auscultation and percussion with normal breathing effort HEART: regular rate & rhythm and no murmurs and no lower extremity edema ABDOMEN:abdomen soft, non-tender and normal bowel  sounds Musculoskeletal:no cyanosis of digits and no clubbing  PSYCH: alert & oriented x 3 with fluent speech NEURO: no focal motor/sensory deficits  LABORATORY DATA:  I have reviewed the data as listed Lab Results  Component Value Date   WBC 6.7 02/20/2019   HGB 12.8 (L) 02/20/2019   HCT 38.5 02/20/2019   MCV 95.3 02/20/2019   PLT 209 02/20/2019     Chemistry      Component Value Date/Time   NA 141 02/20/2019 1548   K 4.1 02/20/2019 1548   CL 108 02/20/2019 1548   CO2 24 02/20/2019 1548   BUN 11 02/20/2019 1548   CREATININE 0.97 02/20/2019 1548      Component Value Date/Time   CALCIUM 9.2 02/20/2019 1548   ALKPHOS 54 07/14/2016 0908   AST 13 02/20/2019 1548   ALT 8 (L) 02/20/2019 1548   BILITOT 0.3 02/20/2019 1548       RADIOGRAPHIC STUDIES: I have personally reviewed the radiological images as listed and agreed with the findings in the report. CT Chest W Contrast  Result Date: 02/23/2019 CLINICAL DATA:  Evaluate for primary and metastatic disease, pathologic cervical lymph node identified on recent CT, history of prostate cancer EXAM: CT CHEST, ABDOMEN, AND PELVIS WITH CONTRAST TECHNIQUE: Multidetector CT imaging of the chest, abdomen and pelvis was performed following the standard protocol during bolus administration of intravenous contrast. CONTRAST:  159mL OMNIPAQUE IOHEXOL 300 MG/ML SOLN, additional oral enteric contrast COMPARISON:  CT neck, 02/15/2019, CT abdomen pelvis, 01/14/2015 FINDINGS: CT CHEST FINDINGS Cardiovascular: Scattered aortic atherosclerosis. Normal heart size. Three-vessel coronary artery calcifications. No pericardial effusion. Mediastinum/Nodes: No enlarged mediastinal, hilar, or axillary lymph nodes. Thyroid gland, trachea, and esophagus demonstrate no significant findings. Lungs/Pleura: Mild paraseptal emphysema. Numerous tiny centrilobular pulmonary nodules, most concentrated in the lung apices. There is a partially calcified 4 mm pulmonary nodule  of the left lower lobe, included on prior CT of the abdomen and pelvis, benign (series 4, image 99). No pleural effusion or pneumothorax. Musculoskeletal: No chest wall mass or suspicious bone lesions identified. CT ABDOMEN PELVIS FINDINGS Hepatobiliary: No solid liver abnormality is seen. No gallstones, gallbladder wall thickening, or biliary dilatation. Pancreas: Unremarkable. No pancreatic ductal dilatation or surrounding inflammatory changes. Spleen: Normal in size without significant abnormality. Adrenals/Urinary Tract: Stable, benign, 2.3 cm right adrenal adenoma. Kidneys are normal, without renal calculi, solid lesion, or hydronephrosis. Bladder is unremarkable. Stomach/Bowel: Stomach is within normal limits. Appendix appears normal. No evidence of bowel wall thickening, distention, or inflammatory changes. Vascular/Lymphatic: Aortic atherosclerosis. Left common iliac artery stent. Incidental note of retroaortic left renal vein. No enlarged abdominal or pelvic lymph nodes. Reproductive: No mass or other abnormality. Other: No abdominal wall hernia or abnormality. No abdominopelvic ascites. Musculoskeletal: No acute or significant osseous findings. IMPRESSION: 1. No evidence of primary malignancy, additional lymphadenopathy, or metastatic disease in the chest, abdomen, or pelvis to correspond to suspicious left cervical lymph node seen on recent CT. 2. Numerous tiny centrilobular pulmonary nodules, most concentrated in the lung apices, likely reflecting smoking-related respiratory bronchiolitis. 3. Mild emphysema.  Emphysema (ICD10-J43.9). 4. Stable benign right adrenal adenoma. 5. Coronary artery disease.  Aortic Atherosclerosis (ICD10-I70.0). Electronically Signed   By: Eddie Candle M.D.   On: 02/23/2019 09:53   CT Abdomen Pelvis W Contrast  Result Date: 02/23/2019 CLINICAL DATA:  Evaluate for primary and metastatic disease, pathologic cervical lymph node identified on recent CT, history of prostate  cancer EXAM: CT CHEST, ABDOMEN, AND PELVIS WITH CONTRAST TECHNIQUE: Multidetector CT imaging of the chest, abdomen and pelvis was performed following the standard protocol during bolus administration of intravenous contrast. CONTRAST:  166mL OMNIPAQUE IOHEXOL 300 MG/ML  SOLN, additional oral enteric contrast COMPARISON:  CT neck, 02/15/2019, CT abdomen pelvis, 01/14/2015 FINDINGS: CT CHEST FINDINGS Cardiovascular: Scattered aortic atherosclerosis. Normal heart size. Three-vessel coronary artery calcifications. No pericardial effusion. Mediastinum/Nodes: No enlarged mediastinal, hilar, or axillary lymph nodes. Thyroid gland, trachea, and esophagus demonstrate no significant findings. Lungs/Pleura: Mild paraseptal emphysema. Numerous tiny centrilobular pulmonary nodules, most concentrated in the lung apices. There is a partially calcified 4 mm pulmonary nodule of the left lower lobe, included on prior CT of the abdomen and pelvis, benign (series 4, image 99). No pleural effusion or pneumothorax. Musculoskeletal: No chest wall mass or suspicious bone lesions identified. CT ABDOMEN PELVIS FINDINGS Hepatobiliary: No solid liver abnormality is seen. No gallstones, gallbladder wall thickening, or biliary dilatation. Pancreas: Unremarkable. No pancreatic ductal dilatation or surrounding inflammatory changes. Spleen: Normal in size without significant abnormality. Adrenals/Urinary Tract: Stable, benign, 2.3 cm right adrenal adenoma. Kidneys are normal, without renal calculi, solid lesion, or hydronephrosis. Bladder is unremarkable. Stomach/Bowel: Stomach is within normal limits. Appendix appears normal. No evidence of bowel wall thickening, distention, or inflammatory changes. Vascular/Lymphatic: Aortic atherosclerosis. Left common iliac artery stent. Incidental note of retroaortic left renal vein. No enlarged abdominal or pelvic lymph nodes. Reproductive: No mass or other abnormality. Other: No abdominal wall hernia or  abnormality. No abdominopelvic ascites. Musculoskeletal: No acute or significant osseous findings. IMPRESSION: 1. No evidence of primary malignancy, additional lymphadenopathy, or metastatic disease in the chest, abdomen, or pelvis to correspond to suspicious left cervical lymph node seen on recent CT. 2. Numerous tiny centrilobular pulmonary nodules, most concentrated in the lung apices, likely reflecting smoking-related respiratory bronchiolitis. 3. Mild emphysema.  Emphysema (ICD10-J43.9). 4. Stable benign right adrenal adenoma. 5. Coronary artery disease.  Aortic Atherosclerosis (ICD10-I70.0). Electronically Signed   By: Eddie Candle M.D.   On: 02/23/2019 09:53   [place] CORE BIOPSY (LYMPH NODES)  Result Date: 03/15/2019 INDICATION: Enlarged level II lymph node of the left neck. EXAM: ULTRASOUND GUIDED CORE BIOPSY OF LEFT CERVICAL LYMPH NODE MEDICATIONS: None. ANESTHESIA/SEDATION: Fentanyl 50 mcg IV; Versed 1.0 mg IV Moderate Sedation Time:  11 minutes. The patient was continuously monitored during the procedure by the interventional radiology nurse under my direct supervision. PROCEDURE: The procedure, risks, benefits, and alternatives were explained to the patient. Questions regarding the procedure were encouraged and answered. The patient understands and consents to the procedure. A time-out was performed prior to initiating the procedure. Ultrasound was used to localize an enlarged left cervical lymph node. The left neck was prepped with chlorhexidine in a sterile fashion, and a sterile drape was applied covering the operative field. A sterile gown and sterile gloves were used for the procedure. Local anesthesia was provided with 1% Lidocaine. Biopsy of an enlarged left cervical lymph node was performed under direct ultrasound guidance with an 18 gauge core biopsy needle device. Five separate core biopsy samples were obtained and submitted in saline. Additional ultrasound was performed. COMPLICATIONS: None  immediate. FINDINGS: Enlarged left cervical lymph node corresponding to the enlarged level II lymph nodes seen by CT measures approximately 2.1 x 1.6 x 2.1 cm. Fragmented solid tissue was obtained in different portions of the lymph node. IMPRESSION: Ultrasound-guided core biopsy performed of an enlarged left cervical lymph node measuring 2.1 cm in greatest diameter. Electronically Signed   By: Aletta Edouard M.D.   On: 03/15/2019 09:36    ASSESSMENT & PLAN:  Metastatic squamous cell carcinoma ([place]) 1.  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 -1 pack/day, 50 years current active smoker -CT soft tissue neck on 02/15/2019 shows irregular soft tissue thickening and enhancement of the left lateral tongue base with left level 2 lymph node measuring 19 x 17 mm. -CT CAP on 02/23/2019 did not show any malignancy. -Left cervical lymph node biopsy on 03/15/2019 consistent with metastatic squamous cell carcinoma, p16 positive. -We talked about the biopsy results in detail.  He needs ENT evaluation with examination and biopsy.  We will make a referral to Dr. Benjamine Mola. -I will also obtain a PET CT scan as part of work-up.  I will see him back after the biopsy and PET scan.  2.  Headaches in the evenings: -Reported headaches which started in the early 2020. -MRI of the brain on 09/20/2018 without contrast showed no acute intracranial abnormality.  Stable appearance since 2015.  3.  History of prostate cancer: -Reported Gleason 9 prostate cancer, diagnosed in November 2013. -Status post XRT from April 2014 through May 2014 with 2 years of Lupron. -He reportedly has PSAs checked periodically and most recent one was good.  Orders Placed This Encounter  Procedures  . NM PET Image Initial (PI) Skull Base To Thigh    Standing Status:   Future    Standing Expiration Date:   03/20/2020    Order  Specific Question:   ** REASON FOR EXAM (FREE TEXT)    Answer:   squamous cell carcinoma left cervical lymph node    Order Specific Question:   If indicated for the ordered procedure, I authorize the administration of a radiopharmaceutical per Radiology protocol    Answer:   Yes    Order Specific Question:   Preferred imaging location?    Answer:   [facility]    Order Specific Question:   Radiology Contrast Protocol - do NOT remove file path    Answer:   [email or web address]    All questions were answered. The patient knows to call the clinic with any problems, questions or concerns.      Derek Jack, MD 03/21/2019 2:26 PM

## 2019-03-22 ENCOUNTER — Other Ambulatory Visit: Payer: Self-pay | Admitting: Otolaryngology

## 2019-03-22 ENCOUNTER — Encounter (HOSPITAL_COMMUNITY): Payer: Self-pay | Admitting: Lab

## 2019-03-22 DIAGNOSIS — R070 Pain in throat: Secondary | ICD-10-CM | POA: Diagnosis not present

## 2019-03-22 DIAGNOSIS — C140 Malignant neoplasm of pharynx, unspecified: Secondary | ICD-10-CM | POA: Diagnosis not present

## 2019-03-22 DIAGNOSIS — R590 Localized enlarged lymph nodes: Secondary | ICD-10-CM | POA: Diagnosis not present

## 2019-03-22 LAB — PATHOLOGY REPORT

## 2019-03-22 LAB — TISSUE SPECIMEN

## 2019-03-22 NOTE — Progress Notes (Unsigned)
Referral sent to Dr Benjamine Mola for Biopsy.  Records faxed on 1/27

## 2019-03-27 ENCOUNTER — Other Ambulatory Visit: Payer: Self-pay

## 2019-03-27 ENCOUNTER — Other Ambulatory Visit: Payer: Self-pay | Admitting: Family Medicine

## 2019-03-27 ENCOUNTER — Encounter (HOSPITAL_COMMUNITY)
Admission: RE | Admit: 2019-03-27 | Discharge: 2019-03-27 | Disposition: A | Discharge status: Home / Self Care | Payer: Medicare Other | Source: Ambulatory Visit | Attending: Hematology | Admitting: Hematology

## 2019-03-27 DIAGNOSIS — C770 Secondary and unspecified malignant neoplasm of lymph nodes of head, face and neck: Secondary | ICD-10-CM | POA: Diagnosis not present

## 2019-03-27 DIAGNOSIS — C779 Secondary and unspecified malignant neoplasm of lymph node, unspecified: Secondary | ICD-10-CM | POA: Diagnosis not present

## 2019-03-27 DIAGNOSIS — C4442 Squamous cell carcinoma of skin of scalp and neck: Secondary | ICD-10-CM | POA: Diagnosis not present

## 2019-03-27 DIAGNOSIS — C760 Malignant neoplasm of head, face and neck: Secondary | ICD-10-CM | POA: Diagnosis not present

## 2019-03-27 MED ORDER — FLUDEOXYGLUCOSE F - 18 (FDG) INJECTION
10.4900 | Freq: Once | INTRAVENOUS | Status: AC | PRN
  Administered 2019-03-27: 10.49 via INTRAVENOUS

## 2019-03-27 NOTE — Telephone Encounter (Signed)
Ok to refill??  Last office visit 03/20/2019.  Last refill 02/23/2019.

## 2019-03-28 ENCOUNTER — Encounter (HOSPITAL_BASED_OUTPATIENT_CLINIC_OR_DEPARTMENT_OTHER): Payer: Self-pay | Admitting: Otolaryngology

## 2019-03-28 ENCOUNTER — Other Ambulatory Visit: Payer: Self-pay | Admitting: Family Medicine

## 2019-03-28 ENCOUNTER — Other Ambulatory Visit: Payer: Self-pay

## 2019-03-29 ENCOUNTER — Ambulatory Visit (HOSPITAL_COMMUNITY): Payer: Medicare Other | Admitting: Hematology

## 2019-03-30 ENCOUNTER — Other Ambulatory Visit (HOSPITAL_COMMUNITY)
Admission: RE | Admit: 2019-03-30 | Discharge: 2019-03-30 | Disposition: A | Discharge status: Home / Self Care | Payer: Medicare Other | Source: Ambulatory Visit | Attending: Otolaryngology | Admitting: Otolaryngology

## 2019-03-30 ENCOUNTER — Other Ambulatory Visit: Payer: Self-pay

## 2019-03-30 ENCOUNTER — Other Ambulatory Visit (HOSPITAL_COMMUNITY): Payer: Medicare Other

## 2019-03-30 DIAGNOSIS — Z20822 Contact with and (suspected) exposure to covid-19: Secondary | ICD-10-CM | POA: Diagnosis not present

## 2019-03-30 DIAGNOSIS — Z01812 Encounter for preprocedural laboratory examination: Secondary | ICD-10-CM | POA: Diagnosis not present

## 2019-03-30 LAB — SARS CORONAVIRUS 2 (TAT 6-24 HRS): SARS Coronavirus 2: NEGATIVE

## 2019-04-03 ENCOUNTER — Encounter (HOSPITAL_BASED_OUTPATIENT_CLINIC_OR_DEPARTMENT_OTHER): Payer: Self-pay | Admitting: Otolaryngology

## 2019-04-03 ENCOUNTER — Encounter (HOSPITAL_BASED_OUTPATIENT_CLINIC_OR_DEPARTMENT_OTHER)
Admission: RE | Disposition: A | Discharge status: Home / Self Care | Payer: Self-pay | Source: Home / Self Care | Attending: Otolaryngology

## 2019-04-03 ENCOUNTER — Ambulatory Visit (HOSPITAL_BASED_OUTPATIENT_CLINIC_OR_DEPARTMENT_OTHER)
Admission: RE | Admit: 2019-04-03 | Discharge: 2019-04-03 | Disposition: A | Discharge status: Home / Self Care | Payer: Medicare Other | Attending: Otolaryngology | Admitting: Otolaryngology

## 2019-04-03 ENCOUNTER — Telehealth: Payer: Self-pay | Admitting: *Deleted

## 2019-04-03 ENCOUNTER — Ambulatory Visit (HOSPITAL_BASED_OUTPATIENT_CLINIC_OR_DEPARTMENT_OTHER): Payer: Medicare Other | Admitting: Anesthesiology

## 2019-04-03 ENCOUNTER — Other Ambulatory Visit: Payer: Self-pay

## 2019-04-03 DIAGNOSIS — M810 Age-related osteoporosis without current pathological fracture: Secondary | ICD-10-CM | POA: Insufficient documentation

## 2019-04-03 DIAGNOSIS — M1990 Unspecified osteoarthritis, unspecified site: Secondary | ICD-10-CM | POA: Insufficient documentation

## 2019-04-03 DIAGNOSIS — J359 Chronic disease of tonsils and adenoids, unspecified: Secondary | ICD-10-CM | POA: Diagnosis not present

## 2019-04-03 DIAGNOSIS — C7989 Secondary malignant neoplasm of other specified sites: Secondary | ICD-10-CM | POA: Diagnosis not present

## 2019-04-03 DIAGNOSIS — C099 Malignant neoplasm of tonsil, unspecified: Secondary | ICD-10-CM | POA: Diagnosis not present

## 2019-04-03 DIAGNOSIS — C61 Malignant neoplasm of prostate: Secondary | ICD-10-CM | POA: Insufficient documentation

## 2019-04-03 DIAGNOSIS — Z79899 Other long term (current) drug therapy: Secondary | ICD-10-CM | POA: Diagnosis not present

## 2019-04-03 DIAGNOSIS — R221 Localized swelling, mass and lump, neck: Secondary | ICD-10-CM | POA: Diagnosis present

## 2019-04-03 DIAGNOSIS — E119 Type 2 diabetes mellitus without complications: Secondary | ICD-10-CM | POA: Diagnosis not present

## 2019-04-03 DIAGNOSIS — Z8249 Family history of ischemic heart disease and other diseases of the circulatory system: Secondary | ICD-10-CM | POA: Diagnosis not present

## 2019-04-03 DIAGNOSIS — N529 Male erectile dysfunction, unspecified: Secondary | ICD-10-CM | POA: Diagnosis not present

## 2019-04-03 DIAGNOSIS — K219 Gastro-esophageal reflux disease without esophagitis: Secondary | ICD-10-CM | POA: Insufficient documentation

## 2019-04-03 DIAGNOSIS — Z7982 Long term (current) use of aspirin: Secondary | ICD-10-CM | POA: Diagnosis not present

## 2019-04-03 DIAGNOSIS — F17210 Nicotine dependence, cigarettes, uncomplicated: Secondary | ICD-10-CM | POA: Insufficient documentation

## 2019-04-03 DIAGNOSIS — F329 Major depressive disorder, single episode, unspecified: Secondary | ICD-10-CM | POA: Diagnosis not present

## 2019-04-03 DIAGNOSIS — Z7902 Long term (current) use of antithrombotics/antiplatelets: Secondary | ICD-10-CM | POA: Insufficient documentation

## 2019-04-03 DIAGNOSIS — C4442 Squamous cell carcinoma of skin of scalp and neck: Secondary | ICD-10-CM | POA: Diagnosis not present

## 2019-04-03 DIAGNOSIS — C140 Malignant neoplasm of pharynx, unspecified: Secondary | ICD-10-CM | POA: Diagnosis not present

## 2019-04-03 DIAGNOSIS — J449 Chronic obstructive pulmonary disease, unspecified: Secondary | ICD-10-CM | POA: Diagnosis not present

## 2019-04-03 DIAGNOSIS — J351 Hypertrophy of tonsils: Secondary | ICD-10-CM | POA: Diagnosis not present

## 2019-04-03 DIAGNOSIS — G2581 Restless legs syndrome: Secondary | ICD-10-CM | POA: Insufficient documentation

## 2019-04-03 DIAGNOSIS — E875 Hyperkalemia: Secondary | ICD-10-CM | POA: Insufficient documentation

## 2019-04-03 DIAGNOSIS — F419 Anxiety disorder, unspecified: Secondary | ICD-10-CM | POA: Diagnosis not present

## 2019-04-03 DIAGNOSIS — E785 Hyperlipidemia, unspecified: Secondary | ICD-10-CM | POA: Diagnosis not present

## 2019-04-03 DIAGNOSIS — I739 Peripheral vascular disease, unspecified: Secondary | ICD-10-CM | POA: Diagnosis not present

## 2019-04-03 HISTORY — PX: TONSILLECTOMY: SHX5217

## 2019-04-03 HISTORY — PX: DIRECT LARYNGOSCOPY: SHX5326

## 2019-04-03 HISTORY — DX: Other diseases of tongue: K14.8

## 2019-04-03 SURGERY — LARYNGOSCOPY, DIRECT
Anesthesia: General | Site: Throat

## 2019-04-03 MED ORDER — FENTANYL CITRATE (PF) 100 MCG/2ML IJ SOLN
50.0000 ug | INTRAMUSCULAR | Status: DC | PRN
  Administered 2019-04-03 (×2): 50 ug via INTRAVENOUS

## 2019-04-03 MED ORDER — ONDANSETRON HCL 4 MG/2ML IJ SOLN
INTRAMUSCULAR | Status: AC
  Filled 2019-04-03: qty 2

## 2019-04-03 MED ORDER — LIDOCAINE 2% (20 MG/ML) 5 ML SYRINGE
INTRAMUSCULAR | Status: AC
  Filled 2019-04-03: qty 5

## 2019-04-03 MED ORDER — OXYCODONE HCL 5 MG/5ML PO SOLN: 5.0000 mg | Freq: Once | ORAL | Status: DC | PRN

## 2019-04-03 MED ORDER — LIDOCAINE 2% (20 MG/ML) 5 ML SYRINGE
INTRAMUSCULAR | Status: DC | PRN
  Administered 2019-04-03: 100 mg via INTRAVENOUS

## 2019-04-03 MED ORDER — CEFAZOLIN SODIUM 1 G IJ SOLR
INTRAMUSCULAR | Status: AC
  Filled 2019-04-03: qty 20

## 2019-04-03 MED ORDER — ROCURONIUM BROMIDE 10 MG/ML (PF) SYRINGE
PREFILLED_SYRINGE | INTRAVENOUS | Status: DC | PRN
  Administered 2019-04-03: 35 mg via INTRAVENOUS

## 2019-04-03 MED ORDER — DEXAMETHASONE SODIUM PHOSPHATE 10 MG/ML IJ SOLN
INTRAMUSCULAR | Status: AC
  Filled 2019-04-03: qty 1

## 2019-04-03 MED ORDER — PROPOFOL 10 MG/ML IV BOLUS
INTRAVENOUS | Status: AC
  Filled 2019-04-03: qty 20

## 2019-04-03 MED ORDER — ONDANSETRON HCL 4 MG/2ML IJ SOLN: 4.0000 mg | Freq: Once | INTRAMUSCULAR | Status: DC | PRN

## 2019-04-03 MED ORDER — ROCURONIUM BROMIDE 10 MG/ML (PF) SYRINGE
PREFILLED_SYRINGE | INTRAVENOUS | Status: AC
  Filled 2019-04-03: qty 10

## 2019-04-03 MED ORDER — FENTANYL CITRATE (PF) 100 MCG/2ML IJ SOLN
25.0000 ug | INTRAMUSCULAR | Status: DC | PRN
  Administered 2019-04-03 (×2): 50 ug via INTRAVENOUS

## 2019-04-03 MED ORDER — MIDAZOLAM HCL 2 MG/2ML IJ SOLN
1.0000 mg | INTRAMUSCULAR | Status: DC | PRN
  Administered 2019-04-03: 2 mg via INTRAVENOUS

## 2019-04-03 MED ORDER — OXYCODONE HCL 5 MG PO TABS: 5.0000 mg | ORAL_TABLET | Freq: Once | ORAL | Status: DC | PRN

## 2019-04-03 MED ORDER — OXYMETAZOLINE HCL 0.05 % NA SOLN
NASAL | Status: DC | PRN
  Administered 2019-04-03: 1 via TOPICAL

## 2019-04-03 MED ORDER — DEXAMETHASONE SODIUM PHOSPHATE 4 MG/ML IJ SOLN
INTRAMUSCULAR | Status: DC | PRN
  Administered 2019-04-03: 10 mg via INTRAVENOUS

## 2019-04-03 MED ORDER — LACTATED RINGERS IV SOLN: INTRAVENOUS | Status: DC

## 2019-04-03 MED ORDER — FENTANYL CITRATE (PF) 100 MCG/2ML IJ SOLN
INTRAMUSCULAR | Status: AC
  Filled 2019-04-03: qty 2

## 2019-04-03 MED ORDER — PROPOFOL 10 MG/ML IV BOLUS
INTRAVENOUS | Status: DC | PRN
  Administered 2019-04-03: 130 mg via INTRAVENOUS
  Administered 2019-04-03: 50 mg via INTRAVENOUS

## 2019-04-03 MED ORDER — ONDANSETRON HCL 4 MG/2ML IJ SOLN
INTRAMUSCULAR | Status: DC | PRN
  Administered 2019-04-03: 4 mg via INTRAVENOUS

## 2019-04-03 MED ORDER — CEFAZOLIN SODIUM-DEXTROSE 2-3 GM-%(50ML) IV SOLR
INTRAVENOUS | Status: DC | PRN
  Administered 2019-04-03: 2 g via INTRAVENOUS

## 2019-04-03 MED ORDER — SUGAMMADEX SODIUM 200 MG/2ML IV SOLN
INTRAVENOUS | Status: DC | PRN
  Administered 2019-04-03: 200 mg via INTRAVENOUS

## 2019-04-03 MED ORDER — AMOXICILLIN 400 MG/5ML PO SUSR: 800.0000 mg | Freq: Two times a day (BID) | ORAL | 0 refills | Status: AC

## 2019-04-03 MED ORDER — SODIUM CHLORIDE 0.9 % IR SOLN
Status: DC | PRN
  Administered 2019-04-03: 100 mL

## 2019-04-03 MED ORDER — MIDAZOLAM HCL 2 MG/2ML IJ SOLN
INTRAMUSCULAR | Status: AC
  Filled 2019-04-03: qty 2

## 2019-04-03 SURGICAL SUPPLY — 48 items
ADAPTER TUBE FLEX ULTRASET (MISCELLANEOUS) IMPLANT
BNDG COHESIVE 2X5 TAN STRL LF (GAUZE/BANDAGES/DRESSINGS) IMPLANT
CANISTER SUCT 1200ML W/VALVE (MISCELLANEOUS) ×4 IMPLANT
CATH ROBINSON RED A/P 10FR (CATHETERS) IMPLANT
CATH ROBINSON RED A/P 14FR (CATHETERS) ×4 IMPLANT
CNTNR URN SCR LID CUP LEK RST (MISCELLANEOUS) ×4 IMPLANT
COAGULATOR SUCT 6 FR SWTCH (ELECTROSURGICAL)
COAGULATOR SUCT SWTCH 10FR 6 (ELECTROSURGICAL) IMPLANT
CONT SPEC 4OZ STRL OR WHT (MISCELLANEOUS) ×4
COVER BACK TABLE 60X90IN (DRAPES) ×4 IMPLANT
COVER MAYO STAND STRL (DRAPES) ×4 IMPLANT
COVER WAND RF STERILE (DRAPES) IMPLANT
ELECT REM PT RETURN 9FT ADLT (ELECTROSURGICAL) ×4
ELECT REM PT RETURN 9FT PED (ELECTROSURGICAL)
ELECTRODE REM PT RETRN 9FT PED (ELECTROSURGICAL) IMPLANT
ELECTRODE REM PT RTRN 9FT ADLT (ELECTROSURGICAL) ×2 IMPLANT
GAUZE SPONGE 4X4 12PLY STRL LF (GAUZE/BANDAGES/DRESSINGS) ×8 IMPLANT
GLOVE BIO SURGEON STRL SZ7 (GLOVE) ×4 IMPLANT
GLOVE BIO SURGEON STRL SZ7.5 (GLOVE) ×4 IMPLANT
GLOVE BIOGEL PI IND STRL 7.5 (GLOVE) ×2 IMPLANT
GLOVE BIOGEL PI INDICATOR 7.5 (GLOVE) ×2
GOWN STRL REUS W/ TWL LRG LVL3 (GOWN DISPOSABLE) ×4 IMPLANT
GOWN STRL REUS W/TWL LRG LVL3 (GOWN DISPOSABLE) ×4
GUARD TEETH (MISCELLANEOUS) IMPLANT
IV NS 500ML (IV SOLUTION) ×2
IV NS 500ML BAXH (IV SOLUTION) ×2 IMPLANT
MARKER SKIN DUAL TIP RULER LAB (MISCELLANEOUS) IMPLANT
NEEDLE HYPO 18GX1.5 BLUNT FILL (NEEDLE) IMPLANT
NEEDLE SPNL 22GX7 QUINCKE BK (NEEDLE) IMPLANT
NEEDLE SPNL 25GX3.5 QUINCKE BL (NEEDLE) IMPLANT
NS IRRIG 1000ML POUR BTL (IV SOLUTION) ×4 IMPLANT
PACK BASIN DAY SURGERY FS (CUSTOM PROCEDURE TRAY) ×4 IMPLANT
PATTIES SURGICAL .5 X3 (DISPOSABLE) ×4 IMPLANT
SHEET MEDIUM DRAPE 40X70 STRL (DRAPES) ×4 IMPLANT
SLEEVE SCD COMPRESS KNEE MED (MISCELLANEOUS) ×4 IMPLANT
SOLUTION BUTLER CLEAR DIP (MISCELLANEOUS) ×4 IMPLANT
SPONGE TONSIL TAPE 1.25 RFD (DISPOSABLE) ×4 IMPLANT
SURGILUBE 2OZ TUBE FLIPTOP (MISCELLANEOUS) IMPLANT
SYR BULB 3OZ (MISCELLANEOUS) ×4 IMPLANT
SYR CONTROL 10ML LL (SYRINGE) IMPLANT
SYR TB 1ML LL NO SAFETY (SYRINGE) IMPLANT
TOWEL GREEN STERILE FF (TOWEL DISPOSABLE) ×4 IMPLANT
TUBE CONNECTING 20'X1/4 (TUBING) ×1
TUBE CONNECTING 20X1/4 (TUBING) ×3 IMPLANT
TUBE SALEM SUMP 12R W/ARV (TUBING) IMPLANT
TUBE SALEM SUMP 16 FR W/ARV (TUBING) ×4 IMPLANT
WAND COBLATOR 70 EVAC XTRA (SURGICAL WAND) ×4 IMPLANT
YANKAUER SUCT BULB TIP NO VENT (SUCTIONS) ×4 IMPLANT

## 2019-04-03 NOTE — Anesthesia Preprocedure Evaluation (Signed)
Anesthesia Evaluation  Patient identified by MRN, date of birth, ID band Patient awake    Reviewed: Allergy & Precautions, NPO status , Patient's Chart, lab work & pertinent test results  History of Anesthesia Complications Negative for: history of anesthetic complications  Airway Mallampati: II  TM Distance: >3 FB Neck ROM: Full    Dental  (+) Edentulous Upper, Edentulous Lower   Pulmonary COPD, Current Smoker,    Pulmonary exam normal        Cardiovascular + Peripheral Vascular Disease  Normal cardiovascular exam     Neuro/Psych PSYCHIATRIC DISORDERS Anxiety Depression negative neurological ROS     GI/Hepatic Neg liver ROS, GERD  ,  Endo/Other  negative endocrine ROS  Renal/GU negative Renal ROS  negative genitourinary   Musculoskeletal  (+) Arthritis ,   Abdominal   Peds  Hematology negative hematology ROS (+)   Anesthesia Other Findings Tongue mass, tonsil mass  Reproductive/Obstetrics                             Anesthesia Physical Anesthesia Plan  ASA: III  Anesthesia Plan: General   Post-op Pain Management:    Induction: Intravenous  PONV Risk Score and Plan: 1 and Ondansetron, Dexamethasone, Treatment may vary due to age or medical condition and Midazolam  Airway Management Planned: Oral ETT  Additional Equipment: None  Intra-op Plan:   Post-operative Plan: Extubation in OR  Informed Consent: I have reviewed the patients History and Physical, chart, labs and discussed the procedure including the risks, benefits and alternatives for the proposed anesthesia with the patient or authorized representative who has indicated his/her understanding and acceptance.     Dental advisory given  Plan Discussed with:   Anesthesia Plan Comments:         Anesthesia Quick Evaluation

## 2019-04-03 NOTE — Discharge Instructions (Addendum)
[facility] Postoperative Instructions for Tonsillectomy  Activity Restrict activity at home for the first two days, resting as much as possible. Light indoor activity is best. You may usually return to school or work within a week but void strenuous activity and sports for two weeks. Sleep with your head elevated on 2-3 pillows for 3-4 days to help decrease swelling. Diet Due to tissue swelling and throat discomfort, you may have little desire to drink for several days. However fluids are very important to prevent dehydration. You will find that non-acidic juices, soups, popsicles, Jell-O, custard, puddings, and any soft or mashed foods taken in small quantities can be swallowed fairly easily. Try to increase your fluid and food intake as the discomfort subsides. It is recommended that a child receive 1-1/2 quarts of fluid in a 24-hour period. Adult require twice this amount.  Discomfort Your sore throat may be relieved by applying an ice collar to your neck and/or by taking Tylenol. You may experience an earache, which is due to referred pain from the throat. Referred ear pain is commonly felt at night when trying to rest.  Bleeding                        Although rare, there is risk of having some bleeding during the first 2 weeks after having a T&A. This usually happens between days 7-10 postoperatively. If you or your child should have any bleeding, try to remain calm. We recommend sitting up quietly in a chair and gently spitting out the blood into a bowl. For adults, gargling gently with ice water may help. If the bleeding does not stop after a short time (5 minutes), is more than 1 teaspoonful, or if you become worried, please call our office at [phone] or go directly to the nearest hospital emergency room. Do not eat or drink anything prior to going to the hospital as you may need to be taken to the operating room in order to control the bleeding. GENERAL  CONSIDERATIONS 1. Brush your teeth regularly. Avoid mouthwashes and gargles for three weeks. You may gargle gently with warm salt-water as necessary or spray with Chloraseptic. You may make salt-water by placing 2 teaspoons of table salt into a quart of fresh water. Warm the salt-water in a microwave to a luke warm temperature.  2. Avoid exposure to colds and upper respiratory infections if possible.  3. If you look into a mirror or into your child's mouth, you will see white-gray patches in the back of the throat. This is normal after having a T&A and is like a scab that forms on the skin after an abrasion. It will disappear once the back of the throat heals completely. However, it may cause a noticeable odor; this too will disappear with time. Again, warm salt-water gargles may be used to help keep the throat clean and promote healing.  4. You may notice a temporary change in voice quality, such as a higher pitched voice or a nasal sound, until healing is complete. This may last for 1-2 weeks and should resolve.  5. Do not take or give you child any medications that we have not prescribed or recommended.  6. Snoring may occur, especially at night, for the first week after a T&A. It is due to swelling of the soft palate and will usually resolve.  Please call our office at [phone] if you have any questions.     Post  Anesthesia Home Care Instructions  Activity: Get plenty of rest for the remainder of the day. A responsible individual must stay with you for 24 hours following the procedure.  For the next 24 hours, DO NOT: -Drive a car -Paediatric nurse -Drink alcoholic beverages -Take any medication unless instructed by your physician -Make any legal decisions or sign important papers.  Meals: Start with liquid foods such as gelatin or soup. Progress to regular foods as tolerated. Avoid greasy, spicy, heavy foods. If nausea and/or vomiting occur, drink only clear liquids until the nausea  and/or vomiting subsides. Call your physician if vomiting continues.  Special Instructions/Symptoms: Your throat may feel dry or sore from the anesthesia or the breathing tube placed in your throat during surgery. If this causes discomfort, gargle with warm salt water. The discomfort should disappear within 24 hours.  If you had a scopolamine patch placed behind your ear for the management of post- operative nausea and/or vomiting:  1. The medication in the patch is effective for 72 hours, after which it should be removed.  Wrap patch in a tissue and discard in the trash. Wash hands thoroughly with soap and water. 2. You may remove the patch earlier than 72 hours if you experience unpleasant side effects which may include dry mouth, dizziness or visual disturbances. 3. Avoid touching the patch. Wash your hands with soap and water after contact with the patch.

## 2019-04-03 NOTE — Anesthesia Procedure Notes (Signed)
Procedure Name: Intubation Date/Time: 04/03/2019 10:46 AM Performed by: Lieutenant Diego, CRNA Pre-anesthesia Checklist: Patient identified, Emergency Drugs available, Suction available and Patient being monitored Patient Re-evaluated:Patient Re-evaluated prior to induction Oxygen Delivery Method: Circle system utilized Preoxygenation: Pre-oxygenation with 100% oxygen Induction Type: IV induction Ventilation: Mask ventilation without difficulty Laryngoscope Size: Miller and 2 Grade View: Grade I Tube type: Oral Tube size: 7.0 mm Number of attempts: 1 Airway Equipment and Method: Stylet and Oral airway Placement Confirmation: ETT inserted through vocal cords under direct vision,  positive ETCO2 and breath sounds checked- equal and bilateral Secured at: 24 cm Tube secured with: Tape Dental Injury: Teeth and Oropharynx as per pre-operative assessment

## 2019-04-03 NOTE — Op Note (Signed)
DATE OF PROCEDURE:  04/03/2019                              OPERATIVE REPORT  SURGEON:  Leta Baptist, MD  PREOPERATIVE DIAGNOSES: 1. Metastatic left neck squamous cell carcinoma.  POSTOPERATIVE DIAGNOSES: 1. Metastatic left neck squamous cell carcinoma  PROCEDURE PERFORMED:   1. Left tonsillectomy. 2. Direct laryngoscopy.  ANESTHESIA:  General endotracheal tube anesthesia.  COMPLICATIONS:  None.  ESTIMATED BLOOD LOSS:  Minimal.  INDICATION FOR PROCEDURE:  Cory Foley is a 69 y.o. male who first noted a left neck mass 2 months ago.  She underwent a CT scan, which showed pathologic level 2 lymph node, suggestive of metastatic disease.  A biopsy was obtained, which was consistent with squamous cell carcinoma.  His subsequent PET scan showed asymmetric enhancement of the left tonsillar region.  His flexible laryngoscopy examination showed no other obvious mucosal lesion or mass. Based on the above findings, the decision was made for the patient to undergo the above stated procedures.  The risks, benefits, alternatives, and details of the procedure were discussed with the patient.  Questions were invited and answered.  Informed consent was obtained.  DESCRIPTION:  The patient was taken to the operating room and placed supine on the operating table.  General endotracheal tube anesthesia was administered by the anesthesiologist.  The patient was positioned and prepped and draped in a standard fashion for trans-oral surgery.    A Dedo laryngoscope was used for examination.  The laryngoscope was inserted via the oral cavity into the pharynx.  Examination of the vallecula, tongue base, epiglottis, aryepiglottic folds, piriform sinuses, and the vocal cords were all normal.  No palpable or visible ulceration was noted.  A small 1+ residual tonsillar mass was noted on the left tonsillar fossa.  The decision was therefore made to proceed with a left tonsillectomy procedure.  A Crowe-Davis mouth gag was  inserted into the oral cavity for exposure. The left tonsil was grasped with a straight Allis clamp and retracted medially.  The tonsillar tissue was noted to be friable and fell apart easily.  The left tonsil was removed in a piecemeal fashion.  It was sent to the pathology department for permanent histologic identification.  Hemostasis was achieved with the Coblator device.  The surgical site was copiously irrigated. The mouth gag was removed.    The care of the patient was turned over to the anesthesiologist.  The patient was awakened from anesthesia without difficulty.  The patient was extubated and transferred to the recovery room in good condition.  OPERATIVE FINDINGS:  A friable left tonsillar lesion was noted. The rest of the laryngoscopy examination was normal.  SPECIMEN:  Left tonsil.  FOLLOWUP CARE:  The patient will be discharged home once awake and alert.  He will be placed on amoxicillin 800 mg p.o. b.i.d. for 5 days, and vicodin for postop pain control.   Jana Swartzlander W Alison Breeding 04/03/2019 11:17 AM

## 2019-04-03 NOTE — Telephone Encounter (Signed)
Received call from patient.   States that tonsillectomy was performed today.  Reports that Dr. Benjamine Mola recommended that he hold Plavix x6 additional days. Reports that he has held Plavix x5 days.   PCP made aware and agreeable to plan.   Call placed to patient and patient wife, Holley Raring made aware.

## 2019-04-03 NOTE — H&P (Signed)
Cc: Left neck mass  HPI: The patient is a 69 y/o male who presents today for evaluation of a left neck mass. The patient is seen in consultation requested by [facility]. The patient first noticed the neck mass 2 months ago. He underwent a CT which showed a pathologic level 2 lymph node compatible with metastatic disease. A biopsy was obtained which was consistent with squamous cell carcinoma. CT also showed a possible mucosal lesion on the left lateral tongue base as primary. The patient currently denies dysphagia or odynophagia.  He denies unexpected weight loss. The patient is a 50+ pack year smoker. He is scheduled for a PET scan next week.   The patient's review of systems (constitutional, eyes, ENT, cardiovascular, respiratory, GI, musculoskeletal, skin, neurologic, psychiatric, endocrine, hematologic, allergic) is noted in the ROS questionnaire.  It is reviewed with the patient.   Family health history: Heart disease, heart attack, diabetes, cancer.  Major events: Prostate surgery /cancer.  Ongoing medical problems: Prostate cancer, arthritis, COPD, GERD, anxiety, osteoarthritis.  Social history: The patient is married. He smokes one pack of cigarettes a day. he denies the use of alcohol or illegal drugs.   Exam: General: Communicates without difficulty, well nourished, no acute distress. Head: Normocephalic, no evidence injury, no tenderness, facial buttresses intact without stepoff. Eyes: PERRL, EOMI. No scleral icterus, conjunctivae clear. Neuro: CN II exam reveals vision grossly intact.  No nystagmus at any point of gaze. Ears: Auricles well formed without lesions.  Ear canals are intact without mass or lesion.  No erythema or edema is appreciated.  The TMs are intact without fluid. Nose: External evaluation reveals normal support and skin without lesions.  Dorsum is intact.  Anterior rhinoscopy reveals healthy pink mucosa over anterior aspect of inferior turbinates and intact  septum.  No purulence noted. Oral:  Oral cavity and oropharynx are intact, symmetric, without erythema or edema.  Mucosa is moist without lesions. Neck: Full range of motion without pain.  Large, firm left level 2 neck mass.  Thyroid bed within normal limits to palpation.  Parotid glands and submandibular glands equal bilaterally without mass.  Trachea is midline. Neuro:  CN 2-12 grossly intact. Gait normal. Vestibular: No nystagmus at any point of gaze. The cerebellar examination is unremarkable.   Procedure:  Flexible Fiberoptic Laryngoscopy -- Risks, benefits, and alternatives of flexible endoscopy were explained to the patient.  Specific mention was made of the risk of throat numbness with difficulty swallowing, possible bleeding from the nose and mouth, and pain from the procedure.  The patient gave oral consent to proceed.  The nasal cavities were decongested and anesthetised with a combination of oxymetazoline and 4% lidocaine solution.  The flexible scope was inserted into the right nasal cavity and advanced towards the nasopharynx.  Visualized mucosa over the turbinates and septum were as described above.  The nasopharynx was clear.  Oropharyngeal walls were symmetric and mobile without lesion, mass, or edema.  Hypopharynx was also without  lesion or edema.  Larynx was mobile without lesions. Supraglottic structures were free of edema, mass, and asymmetry.  True vocal folds were white without mass or lesion.  Base of tongue was within normal limits.  The patient tolerated the procedure well.   Assessment 1. A pathologic level 2 lymph node was recently noted on CT compatible with metastatic disease. A biopsy was obtained which was consistent with squamous cell carcinoma with possible left lateral tongue mass as the primary.  2. No obvious tongue  mass is visualized or palpable on exam.  3. No suspicious mass or lesion is noted on laryngoscopy exam.   Plan  1. The physical exam, CT, and endoscopy  findings are extensively reviewed with the patient. 2. Proceed with PET scan as scheduled.  3. Recommend DL with biopsy and possible left tonsillectomy. The risks, benefits, alternatives, and details of the procedure are reviewed with the patient. Questions are invited and answered. 4. The procedure will be scheduled as soon as possible.

## 2019-04-03 NOTE — Transfer of Care (Signed)
Immediate Anesthesia Transfer of Care Note  Patient: Cory Foley  Procedure(s) Performed: DIRECT LARYNGOSCOPY WITH BIOPSY (N/A ) TONSILLECTOMY (Left )  Patient Location: PACU  Anesthesia Type:General  Level of Consciousness: awake and alert   Airway & Oxygen Therapy: Patient Spontanous Breathing and Patient connected to face mask oxygen  Post-op Assessment: Report given to RN and Post -op Vital signs reviewed and stable  Post vital signs: Reviewed and stable  Last Vitals:  Vitals Value Taken Time  BP 140/87 04/03/19 1121  Temp    Pulse 59 04/03/19 1122  Resp 20 04/03/19 1122  SpO2 100 % 04/03/19 1122  Vitals shown include unvalidated device data.  Last Pain:  Vitals:   04/03/19 0922  TempSrc: Oral  PainSc: 0-No pain         Complications: No apparent anesthesia complications

## 2019-04-03 NOTE — Anesthesia Postprocedure Evaluation (Signed)
Anesthesia Post Note  Patient: [name]  Procedure(s) Performed: DIRECT LARYNGOSCOPY (N/A Throat) TONSILLECTOMY (Left Throat)     Patient location during evaluation: PACU Anesthesia Type: General Level of consciousness: awake and alert Pain management: pain level controlled Vital Signs Assessment: post-procedure vital signs reviewed and stable Respiratory status: spontaneous breathing, nonlabored ventilation and respiratory function stable Cardiovascular status: blood pressure returned to baseline and stable Postop Assessment: no apparent nausea or vomiting Anesthetic complications: no    Last Vitals:  Vitals:   04/03/19 1200 04/03/19 1230  BP: 130/74 (!) 141/80  Pulse: (!) 56 (!) 58  Resp: 14 16  Temp:  36.8 C  SpO2: 95% 98%    Last Pain:  Vitals:   04/03/19 1230  TempSrc:   PainSc: 2                  Lidia Collum

## 2019-04-04 ENCOUNTER — Encounter: Payer: Self-pay | Admitting: *Deleted

## 2019-04-05 LAB — SURGICAL PATHOLOGY

## 2019-04-06 ENCOUNTER — Inpatient Hospital Stay (HOSPITAL_COMMUNITY): Payer: Medicare Other | Attending: Hematology | Admitting: Hematology

## 2019-04-06 ENCOUNTER — Other Ambulatory Visit: Payer: Self-pay

## 2019-04-06 ENCOUNTER — Encounter (HOSPITAL_COMMUNITY): Payer: Self-pay | Admitting: Hematology

## 2019-04-06 VITALS — BP 118/76 | HR 63 | Temp 97.1°F | Resp 14 | Wt 171.4 lb

## 2019-04-06 DIAGNOSIS — Z8546 Personal history of malignant neoplasm of prostate: Secondary | ICD-10-CM | POA: Insufficient documentation

## 2019-04-06 DIAGNOSIS — J449 Chronic obstructive pulmonary disease, unspecified: Secondary | ICD-10-CM | POA: Diagnosis not present

## 2019-04-06 DIAGNOSIS — C770 Secondary and unspecified malignant neoplasm of lymph nodes of head, face and neck: Secondary | ICD-10-CM | POA: Insufficient documentation

## 2019-04-06 DIAGNOSIS — C01 Malignant neoplasm of base of tongue: Secondary | ICD-10-CM | POA: Diagnosis not present

## 2019-04-06 DIAGNOSIS — F419 Anxiety disorder, unspecified: Secondary | ICD-10-CM | POA: Insufficient documentation

## 2019-04-06 DIAGNOSIS — K219 Gastro-esophageal reflux disease without esophagitis: Secondary | ICD-10-CM | POA: Diagnosis not present

## 2019-04-06 DIAGNOSIS — Z9221 Personal history of antineoplastic chemotherapy: Secondary | ICD-10-CM | POA: Diagnosis not present

## 2019-04-06 DIAGNOSIS — I739 Peripheral vascular disease, unspecified: Secondary | ICD-10-CM | POA: Diagnosis not present

## 2019-04-06 DIAGNOSIS — C799 Secondary malignant neoplasm of unspecified site: Secondary | ICD-10-CM

## 2019-04-06 DIAGNOSIS — F17210 Nicotine dependence, cigarettes, uncomplicated: Secondary | ICD-10-CM | POA: Insufficient documentation

## 2019-04-06 DIAGNOSIS — C099 Malignant neoplasm of tonsil, unspecified: Secondary | ICD-10-CM | POA: Diagnosis not present

## 2019-04-06 DIAGNOSIS — E785 Hyperlipidemia, unspecified: Secondary | ICD-10-CM | POA: Insufficient documentation

## 2019-04-06 DIAGNOSIS — IMO0002 Reserved for concepts with insufficient information to code with codable children: Secondary | ICD-10-CM

## 2019-04-06 DIAGNOSIS — C779 Secondary and unspecified malignant neoplasm of lymph node, unspecified: Secondary | ICD-10-CM

## 2019-04-06 DIAGNOSIS — Z79899 Other long term (current) drug therapy: Secondary | ICD-10-CM | POA: Diagnosis not present

## 2019-04-06 DIAGNOSIS — G2581 Restless legs syndrome: Secondary | ICD-10-CM | POA: Insufficient documentation

## 2019-04-06 DIAGNOSIS — Z923 Personal history of irradiation: Secondary | ICD-10-CM | POA: Diagnosis not present

## 2019-04-06 DIAGNOSIS — R519 Headache, unspecified: Secondary | ICD-10-CM | POA: Diagnosis not present

## 2019-04-06 NOTE — Patient Instructions (Addendum)
[facility] Discharge Instructions  You were seen today by Dr. Delton Coombes. He went over your recent test results. He will refer you to radiation in [place]. He will refer you to a surgeon at [facility] for evaluation. He will schedule you with our nutritionist prior to your possible radiation. He will see you back in 4 weeks for labs and follow up.   Thank you for choosing [facility] at [facility] to provide your oncology and hematology care.  To afford each patient quality time with our provider, please arrive at least 15 minutes before your scheduled appointment time.   If you have a lab appointment with the [facility] please come in thru the  Main Entrance and check in at the main information desk  You need to re-schedule your appointment should you arrive 10 or more minutes late.  We strive to give you quality time with our providers, and arriving late affects you and other patients whose appointments are after yours.  Also, if you no show three or more times for appointments you may be dismissed from the clinic at the providers discretion.     Again, thank you for choosing [facility].  Our hope is that these requests will decrease the amount of time that you wait before being seen by our physicians.       _____________________________________________________________  Should you have questions after your visit to [facility], please contact our office at [phone] between the hours of 8:00 a.m. and 4:30 p.m.  Voicemails left after 4:00 p.m. will not be returned until the following business day.  For prescription refill requests, have your pharmacy contact our office and allow 72 hours.    Cancer Center Support Programs:   > Cancer Support Group  2nd Tuesday of the month 1pm-2pm, Journey Room

## 2019-04-06 NOTE — Progress Notes (Signed)
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  CURRENT THERAPY: Referral for surgical resection.  BRIEF ONCOLOGIC HISTORY:  Oncology History   No history exists.     CANCER STAGING: Cancer Staging Tonsillar cancer ([place]) Staging form: Pharynx - HPV-Mediated Oropharynx, AJCC 8th Edition - Clinical stage from 04/07/2019: Stage I (cT2, cN1, cM0, p16+) - Unsigned    INTERVAL HISTORY:  Cory Foley 69 y.o. male seen for follow-up of head and neck squamous cell carcinoma.  He underwent a PET CT scan.  He also underwent left tonsillar biopsy by Dr. Benjamine Mola.  He reports appetite is 75% and energy levels are 25%.  He has some pain in the throat region, rated as 4 out of 10.  He has chronic headaches which are stable.  Denies any tingling or numbness in the extremities.    REVIEW OF SYSTEMS:  Review of Systems  Neurological: Positive for headaches.  Psychiatric/Behavioral: Positive for sleep disturbance. The patient is nervous/anxious.   All other systems reviewed and are negative.    PAST MEDICAL/SURGICAL HISTORY:  Past Medical History:  Diagnosis Date  . Allergy   . Anxiety   . Arthritis   . BPH with obstruction/lower urinary tract symptoms   . COPD, mild ([place])    current smoker  . ED (erectile dysfunction)   . GERD (gastroesophageal reflux disease)   . Hyperlipidemia   . Nocturia   . Osteoporosis   . PAD (peripheral artery disease) ([place])   . Prostate cancer ([place]) 01/11/2012   Adenocarcinoma, Gleason=4+4=8, & 4+5=9,PSA=17.24, Volume= 15.45cc  . PVD (peripheral vascular disease) ([place])   . Restless leg syndrome   . Tongue mass    SCC   Past Surgical History:  Procedure Laterality Date  . ABDOMINAL AORTOGRAM W/LOWER EXTREMITY N/A 08/16/2018   Procedure: ABDOMINAL AORTOGRAM W/LOWER EXTREMITY;  Surgeon: Serafina Mitchell, MD;  Location: [facility];  Service: Cardiovascular;  Laterality: N/A;  Bilateral  . COLONOSCOPY N/A 09/28/2012   Procedure: COLONOSCOPY;  Surgeon: Danie Binder, MD;  [facility];  Service: Endoscopy;  Laterality: N/A;  8:30  . DIRECT LARYNGOSCOPY N/A 04/03/2019   Procedure: DIRECT LARYNGOSCOPY;  Surgeon: Leta Baptist, MD;  Location: [facility];  Service: ENT;  Laterality: N/A;  . ESOPHAGOGASTRODUODENOSCOPY N/A 09/28/2012   Procedure: ESOPHAGOGASTRODUODENOSCOPY (EGD);  Surgeon: Danie Binder, MD;  [facility];  Service: Endoscopy;  Laterality: N/A;  . Gold seed implatation  04/28/2012  . HERNIA REPAIR     Bilateral inguinal X2  . JOINT REPLACEMENT     bilateral  . KNEE SURGERY     Left Knee X 2   and Right knee X1  . MALONEY DILATION N/A 09/28/2012   Procedure: MALONEY DILATION;  Surgeon: Danie Binder, MD;  [facility];  Service: Endoscopy;  Laterality: N/A;  . NOSE SURGERY    . PERIPHERAL VASCULAR INTERVENTION  08/16/2018   Procedure: PERIPHERAL VASCULAR INTERVENTION;  Surgeon: Serafina Mitchell, MD;  Location: [facility];  Service: Cardiovascular;;  LT Iliac  . PR VEIN BYPASS GRAFT,AORTO-FEM-POP  10/21/10   Left AK to BK popliteal BPG  . PROSTATE BIOPSY  01/11/2012   Adenocarcinoma  . PROSTATE SURGERY  2015   Chemo and  Radiation  . SAVORY DILATION N/A 09/28/2012   Procedure: SAVORY DILATION;  Surgeon: Danie Binder, MD;  [facility];  Service: Endoscopy;  Laterality: N/A;  . throat biopsy  03/20/2019  . TONSILLECTOMY Left 04/03/2019   Procedure: TONSILLECTOMY;  Surgeon: Leta Baptist, MD;  Location: [facility];  Service: ENT;  Laterality: Left;     SOCIAL HISTORY:  Social History   Socioeconomic History  . Marital status: Married    Spouse name: Holley Raring  . Number of children: 2  . Years of education: College  . 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 Smoking status: Current Every Day Smoker    Packs/day: 0.50    Years: 43.00    Pack years: 21.50    Types: Cigarettes  . Smokeless tobacco: Never Used  . Tobacco comment: 1/2 ppd  Substance and Sexual Activity  . Alcohol use: No    Comment: quit 1.5 years ago  . Drug use: No  . Sexual activity: Not Currently  Other Topics Concern  . Not on file  Social History Narrative   Patient lives at home with spouse.   Caffeine use: very little   Social Determinants of Health   Financial Resource Strain: Low Risk   . Difficulty of Paying Living Expenses: Not hard at all  Food Insecurity: No Food Insecurity  . Worried About Charity fundraiser in the Last Year: Never true  . Ran Out of Food in the Last Year: Never true  Transportation Needs: No Transportation Needs  . Lack of Transportation (Medical): No  . Lack of Transportation (Non-Medical): No  Physical Activity: Sufficiently Active  . Days of Exercise per Week: 5 days  . Minutes of Exercise per Session: 30 min  Stress: Stress Concern Present  . Feeling of Stress : Rather much  Social Connections: Slightly Isolated  . Frequency of Communication with Friends and Family: Twice a week  . Frequency of Social Gatherings with Friends and Family: Twice a week  . Attends Religious Services: More than 4 times per year  . Active Member of Clubs or Organizations: No  . Attends Archivist Meetings: Never  . Marital Status: Married  Human resources officer Violence: Not At Risk  . Fear of Current or Ex-Partner: No  . Emotionally Abused: No  . Physically Abused: No  . Sexually Abused: No    FAMILY HISTORY:  Family History  Problem Relation Age of Onset  . Heart disease Mother        Valve regurgitation and Pacemaker   . Diabetes Mother   . Hyperlipidemia Mother     . Heart disease Father        CABG x 5  . Hyperlipidemia Father   . Hypertension Father   . Heart attack Father   . Heart disease Sister        aortic valve replacement  . Cancer Sister 74       Colon cancer w/ metastasis  . Throat cancer Sister   . Hypertension Brother   . Heart disease Maternal Grandmother   . Heart disease Maternal Grandfather   . Heart disease Paternal Grandmother   . Cancer Paternal Grandfather   . Healthy Son   . Healthy Daughter     CURRENT MEDICATIONS:  Outpatient Encounter Medications as of  04/06/2019  Medication Sig  . amoxicillin (AMOXIL) 400 MG/5ML suspension Take 10 mLs (800 mg total) by mouth 2 (two) times daily for 5 days.  . calcium carbonate (OS-CAL) 600 MG tablet Take 1 tablet (600 mg total) by mouth 2 (two) times daily with a meal. (Patient taking differently: Take 600 mg by mouth daily. )  . cholecalciferol (VITAMIN D) 1000 units tablet Take 1 tablet (1,000 Units total) by mouth daily.  . cyclobenzaprine (FLEXERIL) 5 MG tablet Take 10 mg by mouth at bedtime.  . famotidine (PEPCID) 20 MG tablet TAKE 1 TABLET BY MOUTH AT BEDTIME.  Marland Kitchen HYDROcodone-acetaminophen (NORCO) 10-325 MG tablet TAKE 1 TABLET BY MOUTH EVERY FOUR HOURS AS NEEDED.  . methocarbamol (ROBAXIN) 500 MG tablet Take 500 mg by mouth 4 (four) times daily as needed (pain.).   Marland Kitchen mirtazapine (REMERON) 15 MG tablet TAKE 1 TABLET BY MOUTH AT BEDTIME.  . pantoprazole (PROTONIX) 40 MG tablet TAKE ONE TABLET BY MOUTH DAILY.  Marland Kitchen rOPINIRole (REQUIP) 3 MG tablet TAKE (1) TABLET BY MOUTH AT BEDTIME.  . simvastatin (ZOCOR) 40 MG tablet TAKE (1) TABLET BY MOUTH AT BEDTIME.  Marland Kitchen topiramate (TOPAMAX) 25 MG tablet TAKE 3 TABLETS BY MOUTH DAILY.   No facility-administered encounter medications on file as of 04/06/2019.    ALLERGIES:  Allergies  Allergen Reactions  . Codeine Other (See Comments)    Bad headache and sweats  . Morphine And Related Itching    sweats  . Percocet  [Oxycodone-Acetaminophen] Other (See Comments)    Headache     PHYSICAL EXAM:  ECOG Performance status: 1  Vitals:   04/06/19 1007  BP: 118/76  Pulse: 63  Resp: 14  Temp: (!) 97.1 F (36.2 C)  SpO2: 100%   Filed Weights   04/06/19 1007  Weight: 171 lb 6.4 oz (77.7 kg)    Physical Exam Vitals reviewed.  Constitutional:      Appearance: Normal appearance.  HENT:     Mouth/Throat:     Mouth: Mucous membranes are moist.  Cardiovascular:     Rate and Rhythm: Normal rate and regular rhythm.     Heart sounds: Normal heart sounds.  Pulmonary:     Effort: Pulmonary effort is normal.     Breath sounds: Normal breath sounds.  Abdominal:     General: There is no distension.     Palpations: Abdomen is soft.  Lymphadenopathy:     Cervical: Cervical adenopathy present.  Skin:    General: Skin is warm.  Neurological:     General: No focal deficit present.     Mental Status: He is alert and oriented to person, place, and time.  Psychiatric:        Mood and Affect: Mood normal.        Behavior: Behavior normal.      LABORATORY DATA:  I have reviewed the labs as listed.  CBC    Component Value Date/Time   WBC 6.7 02/20/2019 1548   RBC 4.04 (L) 02/20/2019 1548   HGB 12.8 (L) 02/20/2019 1548   HCT 38.5 02/20/2019 1548   PLT 209 02/20/2019 1548   MCV 95.3 02/20/2019 1548   MCH 31.7 02/20/2019 1548   MCHC 33.2 02/20/2019 1548   RDW 13.0 02/20/2019 1548   LYMPHSABS 2,124 02/20/2019 1548   MONOABS 416 07/14/2016 0908   EOSABS 94 02/20/2019 1548   BASOSABS 34 02/20/2019 1548   CMP Latest Ref Rng & Units 02/20/2019 02/15/2019 12/26/2018  Glucose 65 - 99  mg/dL 72 - 85  BUN 7 - 25 mg/dL 11 - 12  Creatinine 0.70 - 1.25 mg/dL 0.97 0.90 1.05  Sodium 135 - 146 mmol/L 141 - 141  Potassium 3.5 - 5.3 mmol/L 4.1 - 4.4  Chloride 98 - 110 mmol/L 108 - 112(H)  CO2 20 - 32 mmol/L 24 - 22  Calcium 8.6 - 10.3 mg/dL 9.2 - 9.6  Total Protein 6.1 - 8.1 g/dL 6.3 - 6.5  Total  Bilirubin 0.2 - 1.2 mg/dL 0.3 - 0.3  Alkaline Phos 40 - 115 U/L - - -  AST 10 - 35 U/L 13 - 12  ALT 9 - 46 U/L 8(L) - 9       DIAGNOSTIC IMAGING:  I have independently reviewed the scans and discussed with the patient.    ASSESSMENT & PLAN:   Tonsillar cancer ([place]) 1.  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  50-pack-year current active smoker. -Left cervical lymph node biopsy on 03/15/2019 consistent with metastatic squamous cell carcinoma, p16 positive. -I have reviewed the PET scan images with the patient.  It was done on 03/27/2019 which showed uptake in the left tonsillar fossa and a 1.5 cm lymph node in the left level 2 area.  No other metastatic disease seen. -We reviewed tonsillar biopsy from 04/03/2019.  It shows at least 2 cm friable tumor consistent with squamous cell carcinoma, p16 positive. -I have discussed the possible options including surgical resection versus radiation therapy, given very good prognosis for HPV positive tumors. -I have called and talked to Dr. Nicolette Bang at [facility].  I have also made a referral to [facility] in [place]. -We will also make referral for nutrition consult.  We will see him back in 4 weeks for follow-up.  2.  History of prostate cancer: -Gleason 9 prostate cancer, diagnosed in November 2013.  Status post XRT from April 2014 through May 2014 with 2 years of Lupron. -He is having periodic PSAs.  He remains in remission.         Orders placed this encounter:  Orders Placed This Encounter  Procedures  . CBC with Differential/Platelet  . Comprehensive metabolic panel      Derek Jack, MD [facility] [phone]

## 2019-04-07 DIAGNOSIS — C099 Malignant neoplasm of tonsil, unspecified: Secondary | ICD-10-CM | POA: Insufficient documentation

## 2019-04-07 NOTE — Assessment & Plan Note (Signed)
1.  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  50-pack-year current active smoker. -Left cervical lymph node biopsy on 03/15/2019 consistent with metastatic squamous cell carcinoma, p16 positive. -I have reviewed the PET scan images with the patient.  It was done on 03/27/2019 which showed uptake in the left tonsillar fossa and a 1.5 cm lymph node in the left level 2 area.  No other metastatic disease seen. -We reviewed tonsillar biopsy from 04/03/2019.  It shows at least 2 cm friable tumor consistent with squamous cell carcinoma, p16 positive. -I have discussed the possible options including surgical resection versus radiation therapy, given very good prognosis for HPV positive tumors. -I have called and talked to Dr. Nicolette Bang at [facility].  I have also made a referral to [facility] in [place]. -We will also make referral for nutrition consult.  We will see him back in 4 weeks for follow-up.  2.  History of prostate cancer: -Gleason 9 prostate cancer, diagnosed in November 2013.  Status post XRT from April 2014 through May 2014 with 2 years of Lupron. -He is having periodic PSAs.  He remains in remission.

## 2019-04-10 ENCOUNTER — Other Ambulatory Visit: Payer: Self-pay | Admitting: Family Medicine

## 2019-04-14 ENCOUNTER — Other Ambulatory Visit: Payer: Self-pay

## 2019-04-14 ENCOUNTER — Inpatient Hospital Stay (HOSPITAL_COMMUNITY): Payer: Medicare Other

## 2019-04-14 NOTE — Progress Notes (Signed)
Nutrition Assessment   Reason for Assessment:  Referral new left tonsil squamous cell carcinoma with metastatic disease in lymph node, p 16 positive.  Planning either surgical resection vs radiation therapy.  Has appointment on 2/25 with [facility] to discuss surgical options.  Past medical history of prostate cancer, smoker, COPD, GERD, PAD, PVD.    Met with patient in clinic this am.  Patient reports that since biopsy on 2/8 has had painful swallowing with eating.  Reports that he is eating softer foods.  Reports has never been a breakfast eater.  Usually lunch is peanut butter and jelly sandwich or lunch meat sandwich.  Supper is meat and vegetables.  Has been eating softer meats such as ground chuck lately.  Also has been eating ice cream, jello.  Drinks boost 2 times per day (thinks it is plus).    Medications: Vit D, pepcid, remeron, protonix   Labs: no labs   Anthropometrics:   Height: 68 inches Weight: 172 lb 2 oz today (wearing jacket) Noted 171 lb, 165 lb 12/26/2018, 162 lb 09/19/2018 BMI: 26   Estimated Energy Needs  Kcals: 1950-2300 Protein: 98-115 g Fluid: > 1.9 L   NUTRITION DIAGNOSIS: Predicted suboptimal energy intake related to cancer as evidenced by treatment course and related side effects.    INTERVENTION:  Reviewed importance of good nutrition and stressed weight maintenance. Discussed soft moist foods especially protein foods.  Handout provided. Discussed chopping, grinding, blenderizing foods to change consistencies to make it easier to swallow. Discussed high calorie techniques as well. Patient concerned about already elevated cholesterol level.   Samples of oral nutrition supplements provided today.  Encouraged 350+ calorie shakes. Contact information provided   MONITORING, EVALUATION, GOAL: Patient will consume adequate calories and protein to prevent weight loss during treatment   Next Visit: phone f/u March 12  Joli B. Allen, RD, LDN Registered  Dietitian [phone] (pager)        

## 2019-04-18 DIAGNOSIS — C099 Malignant neoplasm of tonsil, unspecified: Secondary | ICD-10-CM | POA: Diagnosis not present

## 2019-04-18 DIAGNOSIS — C770 Secondary and unspecified malignant neoplasm of lymph nodes of head, face and neck: Secondary | ICD-10-CM | POA: Diagnosis not present

## 2019-04-24 ENCOUNTER — Other Ambulatory Visit: Payer: Self-pay | Admitting: *Deleted

## 2019-04-24 MED ORDER — HYDROCODONE-ACETAMINOPHEN 10-325 MG PO TABS: 1.0000 | ORAL_TABLET | ORAL | 0 refills | Status: DC | PRN

## 2019-04-24 NOTE — Telephone Encounter (Signed)
Received call from patient wife, Holley Raring.   Requested refill on Hydrocodone/APAP.   Ok to refill??  Last office visit 03/20/2019.  Last refill 03/27/2019.

## 2019-04-25 ENCOUNTER — Encounter (HOSPITAL_COMMUNITY): Payer: Self-pay | Admitting: *Deleted

## 2019-04-25 ENCOUNTER — Other Ambulatory Visit (HOSPITAL_COMMUNITY): Payer: Self-pay | Admitting: *Deleted

## 2019-04-25 ENCOUNTER — Other Ambulatory Visit: Payer: Self-pay | Admitting: Family Medicine

## 2019-04-25 DIAGNOSIS — Z510 Encounter for antineoplastic radiation therapy: Secondary | ICD-10-CM | POA: Diagnosis not present

## 2019-04-25 DIAGNOSIS — C099 Malignant neoplasm of tonsil, unspecified: Secondary | ICD-10-CM | POA: Diagnosis not present

## 2019-04-25 DIAGNOSIS — C779 Secondary and unspecified malignant neoplasm of lymph node, unspecified: Secondary | ICD-10-CM

## 2019-04-25 DIAGNOSIS — C090 Malignant neoplasm of tonsillar fossa: Secondary | ICD-10-CM | POA: Diagnosis not present

## 2019-04-25 NOTE — Progress Notes (Signed)
I spoke with patient today and updated him on his upcoming schedule. He verbalizes understanding. I explained that we will give him a copy tomorrow at his visit with Dr. Delton Coombes.

## 2019-04-26 ENCOUNTER — Other Ambulatory Visit: Payer: Self-pay

## 2019-04-26 ENCOUNTER — Inpatient Hospital Stay (HOSPITAL_COMMUNITY): Payer: Medicare Other | Attending: Hematology | Admitting: Hematology

## 2019-04-26 DIAGNOSIS — Z800 Family history of malignant neoplasm of digestive organs: Secondary | ICD-10-CM | POA: Insufficient documentation

## 2019-04-26 DIAGNOSIS — Z5111 Encounter for antineoplastic chemotherapy: Secondary | ICD-10-CM | POA: Diagnosis not present

## 2019-04-26 DIAGNOSIS — Z8249 Family history of ischemic heart disease and other diseases of the circulatory system: Secondary | ICD-10-CM | POA: Diagnosis not present

## 2019-04-26 DIAGNOSIS — F419 Anxiety disorder, unspecified: Secondary | ICD-10-CM | POA: Insufficient documentation

## 2019-04-26 DIAGNOSIS — E785 Hyperlipidemia, unspecified: Secondary | ICD-10-CM | POA: Insufficient documentation

## 2019-04-26 DIAGNOSIS — Z923 Personal history of irradiation: Secondary | ICD-10-CM | POA: Diagnosis not present

## 2019-04-26 DIAGNOSIS — Z833 Family history of diabetes mellitus: Secondary | ICD-10-CM | POA: Diagnosis not present

## 2019-04-26 DIAGNOSIS — Z8546 Personal history of malignant neoplasm of prostate: Secondary | ICD-10-CM | POA: Insufficient documentation

## 2019-04-26 DIAGNOSIS — C099 Malignant neoplasm of tonsil, unspecified: Secondary | ICD-10-CM | POA: Diagnosis not present

## 2019-04-26 DIAGNOSIS — J449 Chronic obstructive pulmonary disease, unspecified: Secondary | ICD-10-CM | POA: Diagnosis not present

## 2019-04-26 DIAGNOSIS — I739 Peripheral vascular disease, unspecified: Secondary | ICD-10-CM | POA: Diagnosis not present

## 2019-04-26 DIAGNOSIS — G2581 Restless legs syndrome: Secondary | ICD-10-CM | POA: Insufficient documentation

## 2019-04-26 DIAGNOSIS — C770 Secondary and unspecified malignant neoplasm of lymph nodes of head, face and neck: Secondary | ICD-10-CM | POA: Insufficient documentation

## 2019-04-26 DIAGNOSIS — Z801 Family history of malignant neoplasm of trachea, bronchus and lung: Secondary | ICD-10-CM | POA: Diagnosis not present

## 2019-04-26 DIAGNOSIS — Z79899 Other long term (current) drug therapy: Secondary | ICD-10-CM | POA: Diagnosis not present

## 2019-04-26 DIAGNOSIS — F17210 Nicotine dependence, cigarettes, uncomplicated: Secondary | ICD-10-CM | POA: Diagnosis not present

## 2019-04-26 NOTE — Progress Notes (Signed)
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  CURRENT THERAPY: Chemoradiation therapy.  BRIEF ONCOLOGIC HISTORY:  Oncology History  Tonsillar cancer ([place])  04/07/2019 Initial Diagnosis   Tonsillar cancer ([place])   05/08/2019 -  Chemotherapy   The patient had palonosetron (ALOXI) injection 0.25 mg, 0.25 mg, Intravenous,  Once, 0 of 7 cycles CISplatin (PLATINOL) 77 mg in sodium chloride 0.9 % 250 mL chemo infusion, 40 mg/m2, Intravenous,  Once, 0 of 7 cycles fosaprepitant (EMEND) 150 mg in sodium chloride 0.9 % 145 mL IVPB, 150 mg, Intravenous,  Once, 0 of 7 cycles  for chemotherapy treatment.       CANCER STAGING: Cancer Staging Tonsillar cancer ([place]) Staging form: Pharynx - HPV-Mediated Oropharynx, AJCC 8th Edition - Clinical stage from 04/07/2019: Stage I (cT2, cN1, cM0, p16+) - Unsigned    INTERVAL HISTORY:  Cory Foley 69 y.o. male seen for follow-up of left tonsillar cancer.  He met with the surgeon at [facility].  He also met with Dr.Yanagihara in [place].  His appetite is good at 100%.  Energy levels are 25 to 50%.  He does report occasional sleep difficulty.  Mild fatigue was reported.  Denies any pain in the head and neck region.  Denies any dysphagia or odynophagia.    REVIEW OF SYSTEMS:  Review of Systems  Constitutional: Positive for fatigue.  Psychiatric/Behavioral: Positive for sleep disturbance.  All other systems reviewed and are negative.    PAST MEDICAL/SURGICAL HISTORY:  Past Medical History:  Diagnosis Date  . Allergy   . Anxiety   . Arthritis   . BPH with obstruction/lower urinary tract symptoms   . COPD, mild ([place])    current smoker  . ED (erectile dysfunction)   . GERD (gastroesophageal reflux disease)   .  Hyperlipidemia   . Nocturia   . Osteoporosis   . PAD (peripheral artery disease) ([place])   . Port-A-Cath in place 04/28/2019  . Prostate cancer ([place]) 01/11/2012   Adenocarcinoma, Gleason=4+4=8, & 4+5=9,PSA=17.24, Volume= 15.45cc  . PVD (peripheral vascular disease) ([place])   . Restless leg syndrome   . Tongue mass    SCC   Past Surgical History:  Procedure Laterality Date  . ABDOMINAL AORTOGRAM W/LOWER EXTREMITY N/A 08/16/2018   Procedure: ABDOMINAL AORTOGRAM W/LOWER EXTREMITY;  Surgeon: Serafina Mitchell, MD;  Location: [facility];  Service: Cardiovascular;  Laterality: N/A;  Bilateral  . COLONOSCOPY N/A 09/28/2012   Procedure: COLONOSCOPY;  Surgeon: Danie Binder, MD;  [facility];  Service: Endoscopy;  Laterality: N/A;  8:30  . DIRECT LARYNGOSCOPY N/A 04/03/2019   Procedure: DIRECT LARYNGOSCOPY;  Surgeon: Leta Baptist, MD;  Location: [facility];  Service: ENT;  Laterality: N/A;  . ESOPHAGOGASTRODUODENOSCOPY N/A 09/28/2012   Procedure: ESOPHAGOGASTRODUODENOSCOPY (EGD);  Surgeon: Danie Binder, MD;  [facility];  Service: Endoscopy;  Laterality: N/A;  . Gold seed implatation  04/28/2012  . HERNIA REPAIR     Bilateral inguinal X2  . JOINT REPLACEMENT     bilateral  . KNEE SURGERY     Left Knee X 2   and Right knee X1  . MALONEY DILATION N/A 09/28/2012   Procedure: MALONEY DILATION;  Surgeon: Danie Binder, MD;  [facility];  Service: Endoscopy;  Laterality: N/A;  . NOSE SURGERY    . PERIPHERAL VASCULAR INTERVENTION  08/16/2018   Procedure: PERIPHERAL VASCULAR INTERVENTION;  Surgeon: Serafina Mitchell, MD;  Location: [facility];  Service: Cardiovascular;;  LT Iliac  . PR VEIN BYPASS GRAFT,AORTO-FEM-POP  10/21/10   Left AK to BK popliteal BPG  . PROSTATE BIOPSY  01/11/2012   Adenocarcinoma  . PROSTATE SURGERY  2015   Chemo and  Radiation  . SAVORY DILATION N/A 09/28/2012   Procedure: SAVORY DILATION;  Surgeon: Danie Binder, MD;   [facility];  Service: Endoscopy;  Laterality: N/A;  . throat biopsy  03/20/2019  . TONSILLECTOMY Left 04/03/2019   Procedure: TONSILLECTOMY;  Surgeon: Leta Baptist, MD;  Location: [facility];  Service: ENT;  Laterality: Left;     SOCIAL HISTORY:  Social History   Socioeconomic History  . Marital status: Married    Spouse name: Holley Raring  . Number of children: 2  . Years of education: College  . 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 Smoking status: Current Every Day Smoker    Packs/day: 0.50    Years: 43.00    Pack years: 21.50    Types: Cigarettes  . Smokeless tobacco: Never Used  . Tobacco comment: 1/2 ppd  Substance and Sexual Activity  . Alcohol use: No    Comment: quit 1.5 years ago  . Drug use: No  . Sexual activity: Not Currently  Other Topics Concern  . Not on file  Social History Narrative   Patient lives at home with spouse.   Caffeine use: very little   Social Determinants of Health   Financial Resource Strain: Low Risk   . Difficulty of Paying Living Expenses: Not hard at all  Food Insecurity: No Food Insecurity  . Worried About Charity fundraiser in the Last Year: Never true  . Ran Out of Food in the Last Year: Never true  Transportation Needs: No Transportation Needs  . Lack of Transportation (Medical): No  . Lack of Transportation (Non-Medical): No  Physical Activity: Sufficiently Active  . Days of Exercise per Week: 5 days  . Minutes of Exercise per Session: 30 min  Stress: Stress Concern Present  . Feeling of Stress : Rather much  Social Connections: Slightly Isolated  . Frequency of Communication with Friends and Family: Twice a week  . Frequency of Social Gatherings with Friends and Family: Twice a week  . Attends Religious Services: More than 4 times per year  . Active Member of Clubs or  Organizations: No  . Attends Archivist Meetings: Never  . Marital Status: Married  Human resources officer Violence: Not At Risk  . Fear of Current or Ex-Partner: No  . Emotionally Abused: No  . Physically Abused: No  . Sexually Abused: No    FAMILY HISTORY:  Family History  Problem Relation Age of Onset  . Heart disease Mother        Valve regurgitation and Pacemaker   . Diabetes Mother   . Hyperlipidemia Mother   . Heart disease Father        CABG x 5  . Hyperlipidemia Father   . Hypertension Father   . Heart attack Father   . Heart disease Sister  aortic valve replacement  . Cancer Sister 15       Colon cancer w/ metastasis  . Throat cancer Sister   . Hypertension Brother   . Heart disease Maternal Grandmother   . Heart disease Maternal Grandfather   . Heart disease Paternal Grandmother   . Cancer Paternal Grandfather   . Healthy Son   . Healthy Daughter     CURRENT MEDICATIONS:  Outpatient Encounter Medications as of 04/26/2019  Medication Sig Note  . calcium carbonate (OS-CAL) 600 MG tablet Take 1 tablet (600 mg total) by mouth 2 (two) times daily with a meal. (Patient taking differently: Take 600 mg by mouth 2 (two) times daily. )   . cholecalciferol (VITAMIN D) 1000 units tablet Take 1 tablet (1,000 Units total) by mouth daily. (Patient not taking: Reported on 04/27/2019)   . clopidogrel (PLAVIX) 75 MG tablet Take 75 mg by mouth daily.    . cyclobenzaprine (FLEXERIL) 5 MG tablet Take 5 mg by mouth at bedtime as needed for muscle spasms.    . famotidine (PEPCID) 20 MG tablet TAKE 1 TABLET BY MOUTH AT BEDTIME. (Patient taking differently: Take 20 mg by mouth at bedtime. )   . mirtazapine (REMERON) 15 MG tablet TAKE 1 TABLET BY MOUTH AT BEDTIME. (Patient taking differently: Take 15 mg by mouth at bedtime. )   . pantoprazole (PROTONIX) 40 MG tablet TAKE ONE TABLET BY MOUTH DAILY. (Patient taking differently: Take 40 mg by mouth daily. )   . rOPINIRole (REQUIP) 3  MG tablet TAKE (1) TABLET BY MOUTH AT BEDTIME. (Patient taking differently: Take 3 mg by mouth at bedtime. )   . simvastatin (ZOCOR) 40 MG tablet TAKE (1) TABLET BY MOUTH AT BEDTIME. (Patient taking differently: Take 40 mg by mouth at bedtime. )   . topiramate (TOPAMAX) 25 MG tablet TAKE 3 TABLETS BY MOUTH DAILY. (Patient taking differently: Take 50-75 mg by mouth at bedtime. ) 04/27/2019: If pt is having frequent migraines he'll take 75 mg at night, if not he'll take 50 mg at night  . [DISCONTINUED] Calcium Carb-Cholecalciferol 500-600 MG-UNIT TABS Take by mouth.   Marland Kitchen HYDROcodone-acetaminophen (NORCO) 10-325 MG tablet Take 1 tablet by mouth every 4 (four) hours as needed. Chronic Pain. Dx: G89.4 (Patient taking differently: Take 1 tablet by mouth every 4 (four) hours as needed for moderate pain. )   . methocarbamol (ROBAXIN) 500 MG tablet Take 500 mg by mouth every 6 (six) hours as needed for muscle spasms.     No facility-administered encounter medications on file as of 04/26/2019.    ALLERGIES:  Allergies  Allergen Reactions  . Codeine Other (See Comments)    Bad headache and sweats  . Morphine And Related Itching    sweats  . Percocet [Oxycodone-Acetaminophen] Other (See Comments)    Headache and sweats     PHYSICAL EXAM:  ECOG Performance status: 1  Vitals:   04/26/19 0852  BP: 111/71  Pulse: 66  Resp: 18  Temp: (!) 97.1 F (36.2 C)  SpO2: 95%   Filed Weights   04/26/19 0852  Weight: 172 lb (78 kg)    Physical Exam Vitals reviewed.  Constitutional:      Appearance: Normal appearance.  HENT:     Mouth/Throat:     Mouth: Mucous membranes are moist.  Cardiovascular:     Rate and Rhythm: Normal rate and regular rhythm.     Heart sounds: Normal heart sounds.  Pulmonary:     Effort: Pulmonary effort  is normal.     Breath sounds: Normal breath sounds.  Abdominal:     General: There is no distension.     Palpations: Abdomen is soft.  Lymphadenopathy:     Cervical:  Cervical adenopathy present.  Skin:    General: Skin is warm.  Neurological:     General: No focal deficit present.     Mental Status: He is alert and oriented to person, place, and time.  Psychiatric:        Mood and Affect: Mood normal.        Behavior: Behavior normal.      LABORATORY DATA:  I have reviewed the labs as listed.  CBC    Component Value Date/Time   WBC 6.7 02/20/2019 1548   RBC 4.04 (L) 02/20/2019 1548   HGB 12.8 (L) 02/20/2019 1548   HCT 38.5 02/20/2019 1548   PLT 209 02/20/2019 1548   MCV 95.3 02/20/2019 1548   MCH 31.7 02/20/2019 1548   MCHC 33.2 02/20/2019 1548   RDW 13.0 02/20/2019 1548   LYMPHSABS 2,124 02/20/2019 1548   MONOABS 416 07/14/2016 0908   EOSABS 94 02/20/2019 1548   BASOSABS 34 02/20/2019 1548   CMP Latest Ref Rng & Units 02/20/2019 02/15/2019 12/26/2018  Glucose 65 - 99 mg/dL 72 - 85  BUN 7 - 25 mg/dL 11 - 12  Creatinine 0.70 - 1.25 mg/dL 0.97 0.90 1.05  Sodium 135 - 146 mmol/L 141 - 141  Potassium 3.5 - 5.3 mmol/L 4.1 - 4.4  Chloride 98 - 110 mmol/L 108 - 112(H)  CO2 20 - 32 mmol/L 24 - 22  Calcium 8.6 - 10.3 mg/dL 9.2 - 9.6  Total Protein 6.1 - 8.1 g/dL 6.3 - 6.5  Total Bilirubin 0.2 - 1.2 mg/dL 0.3 - 0.3  Alkaline Phos 40 - 115 U/L - - -  AST 10 - 35 U/L 13 - 12  ALT 9 - 46 U/L 8(L) - 9       DIAGNOSTIC IMAGING:  I have independently reviewed scans and discussed with the patient.    ASSESSMENT & PLAN:   Tonsillar cancer ([place]) 1.  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  No other metastatic disease seen. -Tonsillar biopsy on 04/03/2019 consistent with squamous cell carcinoma.  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 -He was evaluated by Dr. Nicolette Bang at [facility].  He was offered TORS resection of the primary and  lymph node area.  Risks and benefits were discussed with the patient.  I have reviewed records from [facility]. -Patient also met with Dr.Yanagihara in [place]. -Because of the risks involved, he decided against surgery. -We talked about the normal prognosis of p16 positive oropharyngeal cancer.  I have recommended chemoradiation therapy with cisplatin. -We talked about the side effects of cisplatin in detail including but not limited to neuropathy, nephrotoxicity, ototoxicity, cytopenias among others. -We will use weekly cisplatin 40 mg per metered squared regimen for better tolerability.  We also talked about potential feeding tube placement if there is significant weight loss during treatment. -He will likely start chemo and radiation therapy during the week of 05/08/2019.  We will check his labs and see him back prior to his treatment.  2.  History of prostate cancer: -Gleason 9 prostate cancer, diagnosed in #  2013, status post XRT from April 2014 through May 2014 with 2 years of Lupron. -He is having periodic PSAs checked and is continuing to be in remission.      Orders placed this encounter:  No orders of the defined types were placed in this encounter.  Total time spent is 40 minutes with more than 50% of the time spent face-to-face discussing new treatment plan and side effects.  I have also reviewed the records from outside hospital.   Derek Jack, MD [place] [phone]

## 2019-04-26 NOTE — Progress Notes (Signed)
START ON PATHWAY REGIMEN - Head and Neck     A cycle is every 7 days:     Cisplatin   **Always confirm dose/schedule in your pharmacy ordering system**  Patient Characteristics: Oropharynx, HPV Positive, Pathologically Staged, T0-4, pN1-2 or T4, pN0 Disease Classification: Oropharynx HPV Status: Positive (+) AJCC N Category: pN1 AJCC 8 Stage Grouping: I Current Disease Status: No Distant Metastases and No Recurrent Disease AJCC T Category: T2 AJCC M Category: M0 Intent of Therapy: Curative Intent, Discussed with Patient

## 2019-04-26 NOTE — Patient Instructions (Signed)
Cory Foley at [facility] Discharge Instructions  You were seen today by Dr. Delton Coombes. He went over how you've been feeling since your last visit. He discussed your treatment plan and reviewed the medication that you will be given. Cisplatin is the medication you will receive, this will be given weekly. You will need to make sure that you drink lots of water while on this treatment to help flush your kidneys. He will schedule you for a port-a-cath placement, this will be done in day surgery as an outpatient procedure. He will see you back in 2 weeks for labs, treatment and follow up.   Thank you for choosing [place] at [facility] to provide your oncology and hematology care.  To afford each patient quality time with our provider, please arrive at least 15 minutes before your scheduled appointment time.   If you have a lab appointment with the [facility] please come in thru the  Main Entrance and check in at the main information desk  You need to re-schedule your appointment should you arrive 10 or more minutes late.  We strive to give you quality time with our providers, and arriving late affects you and other patients whose appointments are after yours.  Also, if you no show three or more times for appointments you may be dismissed from the clinic at the providers discretion.     Again, thank you for choosing [facility].  Our hope is that these requests will decrease the amount of time that you wait before being seen by our physicians.       _____________________________________________________________  Should you have questions after your visit to [facility], please contact our office at [phone] between the hours of 8:00 a.m. and 4:30 p.m.  Voicemails left after 4:00 p.m. will not be returned until the following business day.  For prescription refill requests, have your pharmacy contact our office and allow 72  hours.    Cancer Center Support Programs:   > Cancer Support Group  2nd Tuesday of the month 1pm-2pm, Journey Room

## 2019-04-27 DIAGNOSIS — Z510 Encounter for antineoplastic radiation therapy: Secondary | ICD-10-CM | POA: Diagnosis not present

## 2019-04-27 DIAGNOSIS — C090 Malignant neoplasm of tonsillar fossa: Secondary | ICD-10-CM | POA: Diagnosis not present

## 2019-04-28 ENCOUNTER — Encounter (HOSPITAL_COMMUNITY): Payer: Self-pay

## 2019-04-28 ENCOUNTER — Encounter (HOSPITAL_COMMUNITY): Payer: Self-pay | Admitting: *Deleted

## 2019-04-28 ENCOUNTER — Telehealth (HOSPITAL_COMMUNITY): Payer: Self-pay

## 2019-04-28 DIAGNOSIS — Z95828 Presence of other vascular implants and grafts: Secondary | ICD-10-CM | POA: Insufficient documentation

## 2019-04-28 HISTORY — DX: Presence of other vascular implants and grafts: Z95.828

## 2019-04-28 NOTE — Patient Instructions (Addendum)
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  You will be treated weekly in conjunction with radiation therapy with cisplatin.  The intent of treatment is to cure your disease. You will see the doctor regularly throughout treatment.  We will obtain blood work from you prior to every treatment and monitor your results to make sure it is safe to give your treatment. The doctor monitors your response to treatment by the way you are feeling, your blood work, and by obtaining scans periodically.  There will be wait times while you are here for treatment.  It will take about 30 minutes to 1 hour for your lab work to result.  Then there will be wait times while pharmacy mixes your medications.    Medications you will receive in the clinic prior to your chemotherapy medications:  Aloxi:  ALOXI is used in adults to help prevent nausea and vomiting that happens with certain chemotherapy drugs.  Aloxi is a long acting medication, and will remain in your system for about two days.   Emend:  This is an anti-nausea medication that is used with Aloxi to help prevent nausea and vomiting caused by chemotherapy.  Dexamethasone:  This is a steroid given prior to chemotherapy to help prevent allergic reactions; it may also help prevent and control nausea and diarrhea.    CISPLATIN  About This Drug Cisplatin is a drug used to treat cancer. This drug is given in the vein (IV).  This will take 1 hour to infuse.  With this drug you will receive 2 hours of IV hydration prior to administration and 1 hour of IV hydration after administration.  This is to help protect your kidneys.  You will have to urinate 200 mL prior to receiving this medication.  We will give you something to measure your urine in.   Possible Side Effects (More Common) . This drug may affect how your kidneys work. Your kidney function will be checked as needed. .  Electrolyte changes. Your blood will be checked for electrolyte changes as needed. . High-frequency hearing loss may occur. You will get IV fluids before and during the Cisplatin infusion to help prevent this. You may also get ringing in the ears. . Bone marrow depression. This is a decrease in the number of white blood cells, red blood cells, and platelets. This may raise your risk of infection, make you tired and weak (fatigue), and raise your risk of bleeding. . Nausea and throwing up (vomiting). These symptoms may happen within a few hours after your treatment and may last for a few days to a week. Medicines are available to stop or lessen these side effects.  Possible Side Effects (Less Common) . Effects on the nerves are called peripheral neuropathy. You may feel numbness or pain in your hands and feet. It may be hard for you to button your clothes, open jars, or walk as usual. The effect on the nerves may get worse with more doses of the drug. These effects get better in some people after the drug is stopped, but it does not get better in all people. Marland Kitchen Blurred vision or other changes in eyesight. . Soreness of the mouth and throat. You may have red areas, white patches, or sores that hurt. . Hair loss. You may notice your hair getting thin. Some patients lose their hair. Your hair often grows back when treatment is done.  Allergic Reactions  Allergic reactions to this drug are rare, but may happen in some patients. Signs of allergic reactions to this drug may be a rash, fever, chills, feeling dizzy, trouble breathing, and/or feeling that your heart is beating in a fast or not normal way.  Treating Side Effects . Drink 6-8 cups of fluids each day unless your doctor has told you to limit your fluid intake due to some other health problem. A cup is 8 ounces of fluid. If you throw up or have loose bowel movements you should drink more fluids so that you do not become dehydrated (lack water in the  body due to losing too much fluid). . If you have numbness and tingling in your hands and feet, be careful when cooking, walking, and handling sharp objects and hot liquids. . Mouth care is very important. Your mouth care should consist of routine, gentle cleaning of your teeth or dentures and rinsing your mouth with a mixture of 1/2 teaspoon of salt in 8 ounces of water or  teaspoon of baking soda in 8 ounces of water. This should be done at least after each meal and at bedtime. . If you have mouth sores, avoid mouthwash that has alcohol. Also avoid alcohol and smoking because they can bother your mouth and throat. . Talk with your nurse about getting a wig before you lose your hair. Also, call the [facility] at [phone] to find out information about the "Look Good, Feel Better" program close to where you live. It is a free program where women getting chemotherapy can learn about wigs, turbans and scarves as well as makeup techniques and skin and nail care.  Food and Drug Interactions  There are no known interactions of Cisplatin with food. This drug may interact with other medicines. Tell your doctor and pharmacist about all the medicines and dietary supplements (vitamins, minerals, herbs and others) that you are taking at this time. The safety and use of dietary supplements and alternative diets are often not known. Using these might affect your cancer or interfere with your treatment. Until more is known, you should not use dietary supplements or alternative diets without your cancer doctor's help.  When to Call the Doctor  Call your doctor or nurse right away if you have any of these symptoms: . Rash or itching . Feeling dizzy or lightheaded . Wheezing or trouble breathing . Swelling of the face . Fever of 100.5 F (38 C) or above . Chills . Easy bleeding or bruising . Decreased urine . Weight gain of 5 pounds in one week (fluid retention) . Nausea that stops you from  eating or drinking . Throwing up more than 3 times a day Call your doctor or nurse as soon as possible if you have these symptoms: . Numbness, tingling, decreased feeling or weakness in fingers, toes, arms, or legs . Trouble walking or changes in the way you walk, feeling clumsy when buttoning clothes, opening jars, or other routine hand motions . Blurred vision or other changes in eyesight . Changes in hearing, ringing in the ears . Pain in your mouth or throat that makes it hard to eat or drink . Fatigue that interferes with your daily activities  Sexual Problems and Reproductive Concerns  . Infertility warning: Sexual problems and reproduction concerns may occur. In both men and women, this drug may affect your ability to have children. This cannot be determined before your treatment. Speak with your doctor or nurse if you plan to have  children. Ask for information on sperm or egg banking. . In men, this drug may interfere with your ability to make sperm, but it should not change your ability to have sexual relations. . In women, menstrual bleeding may become irregular or stop while you are receiving this drug. Do not assume that you cannot become pregnant if you do not have a menstrual period. . Women may experience signs of menopause like vaginal dryness, itching, and pain during sexual relations . Genetic counseling is available for you to talk about the effects of this drug therapy on future pregnancies. Also, a genetic counselor can look at the possible risk of problems in the unborn baby due to this medicine if an exposure happens during pregnancy.  SELF CARE ACTIVITIES WHILE RECEIVING CHEMOTHERAPY:  Hydration Increase your fluid intake 48 hours prior to treatment and drink at least 8 to 12 cups (64 ounces) of water/decaffeinated beverages per day after treatment. You can still have your cup of coffee or soda but these beverages do not count as part of your 8 to 12 cups that you need to  drink daily. No alcohol intake.  Medications Continue taking your normal prescription medication as prescribed.  If you start any new herbal or new supplements please let us know first to make sure it is safe.  Mouth Care Have teeth cleaned professionally before starting treatment. Keep dentures and partial plates clean. Use soft toothbrush and do not use mouthwashes that contain alcohol. Biotene is a good mouthwash that is available at most pharmacies or may be ordered by calling [phone]. Use warm salt water gargles (1 teaspoon salt per 1 quart warm water) before and after meals and at bedtime. If you need dental work, please let the doctor know before you go for your appointment so that we can coordinate the best possible time for you in regards to your chemo regimen. You need to also let your dentist know that you are actively taking chemo. We may need to do labs prior to your dental appointment.  Skin Care Always use sunscreen that has not expired and with SPF (Sun Protection Factor) of 50 or higher. Wear hats to protect your head from the sun. Remember to use sunscreen on your hands, ears, face, & feet.  Use good moisturizing lotions such as udder cream, eucerin, or even Vaseline. Some chemotherapies can cause dry skin, color changes in your skin and nails.    . Avoid long, hot showers or baths. . Use gentle, fragrance-free soaps and laundry detergent. . Use moisturizers, preferably creams or ointments rather than lotions because the thicker consistency is better at preventing skin dehydration. Apply the cream or ointment within 15 minutes of showering. Reapply moisturizer at night, and moisturize your hands every time after you wash them.  Hair Loss (if your doctor says your hair will fall out)  . If your doctor says that your hair is likely to fall out, decide before you begin chemo whether you want to wear a wig. You may want to shop before treatment to match your hair color. . Hats,  turbans, and scarves can also camouflage hair loss, although some people prefer to leave their heads uncovered. If you go bare-headed outdoors, be sure to use sunscreen on your scalp. . Cut your hair short. It eases the inconvenience of shedding lots of hair, but it also can reduce the emotional impact of watching your hair fall out. . Don't perm or color your hair during chemotherapy. Those chemical  treatments are already damaging to hair and can enhance hair loss. Once your chemo treatments are done and your hair has grown back, it's OK to resume dyeing or perming hair.  With chemotherapy, hair loss is almost always temporary. But when it grows back, it may be a different color or texture. In older adults who still had hair color before chemotherapy, the new growth may be completely gray.  Often, new hair is very fine and soft.  Infection Prevention Please wash your hands for at least 30 seconds using warm soapy water. Handwashing is the #1 way to prevent the spread of germs. Stay away from sick people or people who are getting over a cold. If you develop respiratory systems such as green/yellow mucus production or productive cough or persistent cough let us know and we will see if you need an antibiotic. It is a good idea to keep a pair of gloves on when going into grocery stores/[employer] to decrease your risk of coming into contact with germs on the carts, etc. Carry alcohol hand gel with you at all times and use it frequently if out in public. If your temperature reaches 100.5 or higher please call the clinic and let us know.  If it is after hours or on the weekend please go to the ER if your temperature is over 100.5.  Please have your own personal thermometer at home to use.    Sex and bodily fluids If you are going to have sex, a condom must be used to protect the person that isn't taking chemotherapy. Chemo can decrease your libido (sex drive). For a few days after chemotherapy, chemotherapy can be  excreted through your bodily fluids.  When using the toilet please close the lid and flush the toilet twice.  Do this for a few day after you have had chemotherapy.   Effects of chemotherapy on your sex life Some changes are simple and won't last long. They won't affect your sex life permanently.  Sometimes you may feel: . too tired . not strong enough to be very active . sick or sore  . not in the mood . anxious or low Your anxiety might not seem related to sex. For example, you may be worried about the cancer and how your treatment is going. Or you may be worried about money, or about how you family are coping with your illness.  These things can cause stress, which can affect your interest in sex. It's important to talk to your partner about how you feel.  Remember - the changes to your sex life don't usually last long. There's usually no medical reason to stop having sex during chemo. The drugs won't have any long term physical effects on your performance or enjoyment of sex. Cancer can't be passed on to your partner during sex  Contraception It's important to use reliable contraception during treatment. Avoid getting pregnant while you or your partner are having chemotherapy. This is because the drugs may harm the baby. Sometimes chemotherapy drugs can leave a man or woman infertile.  This means you would not be able to have children in the future. You might want to talk to someone about permanent infertility. It can be very difficult to learn that you may no longer be able to have children. Some people find counselling helpful. There might be ways to preserve your fertility, although this is easier for men than for women. You may want to speak to a fertility expert. You can talk  about sperm banking or harvesting your eggs. You can also ask about other fertility options, such as donor eggs. If you have or have had breast cancer, your doctor might advise you not to take the contraceptive pill. This  is because the hormones in it might affect the cancer. It is not known for sure whether or not chemotherapy drugs can be passed on through semen or secretions from the vagina. Because of this some doctors advise people to use a barrier method if you have sex during treatment. This applies to vaginal, anal or oral sex. Generally, doctors advise a barrier method only for the time you are actually having the treatment and for about a week after your treatment. Advice like this can be worrying, but this does not mean that you have to avoid being intimate with your partner. You can still have close contact with your partner and continue to enjoy sex.  Animals If you have cats or birds we just ask that you not change the litter or change the cage.  Please have someone else do this for you while you are on chemotherapy.   Food Safety During and After Cancer Treatment Food safety is important for people both during and after cancer treatment. Cancer and cancer treatments, such as chemotherapy, radiation therapy, and stem cell/bone marrow transplantation, often weaken the immune system. This makes it harder for your body to protect itself from foodborne illness, also called food poisoning. Foodborne illness is caused by eating food that contains harmful bacteria, parasites, or viruses.  Foods to avoid Some foods have a higher risk of becoming tainted with bacteria. These include: Marland Kitchen Unwashed fresh fruit and vegetables, especially leafy vegetables that can hide dirt and other contaminants . Raw sprouts, such as alfalfa sprouts . Raw or undercooked beef, especially ground beef, or other raw or undercooked meat and poultry . Fatty, fried, or spicy foods immediately before or after treatment.  These can sit heavy on your stomach and make you feel nauseous. . Raw or undercooked shellfish, such as oysters. . Sushi and sashimi, which often contain raw fish.  . Unpasteurized beverages, such as unpasteurized fruit  juices, raw milk, raw yogurt, or cider . Undercooked eggs, such as soft boiled, over easy, and poached; raw, unpasteurized eggs; or foods made with raw egg, such as homemade raw cookie dough and homemade mayonnaise  Simple steps for food safety  Shop smart. . Do not buy food stored or displayed in an unclean area. . Do not buy bruised or damaged fruits or vegetables. . Do not buy cans that have cracks, dents, or bulges. . Pick up foods that can spoil at the end of your shopping trip and store them in a cooler on the way home.  Prepare and clean up foods carefully. . Rinse all fresh fruits and vegetables under running water, and dry them with a clean towel or paper towel. . Clean the top of cans before opening them. . After preparing food, wash your hands for 20 seconds with hot water and soap. Pay special attention to areas between fingers and under nails. . Clean your utensils and dishes with hot water and soap. Marland Kitchen Disinfect your kitchen and cutting boards using 1 teaspoon of liquid, unscented bleach mixed into 1 quart of water.    Dispose of old food. . Eat canned and packaged food before its expiration date (the "use by" or "best before" date). . Consume refrigerated leftovers within 3 to 4 days. After that time, throw  out the food. Even if the food does not smell or look spoiled, it still may be unsafe. Some bacteria, such as Listeria, can grow even on foods stored in the refrigerator if they are kept for too long.  Take precautions when eating out. . At restaurants, avoid buffets and salad bars where food sits out for a long time and comes in contact with many people. Food can become contaminated when someone with a virus, often a norovirus, or another "bug" handles it. . Put any leftover food in a "to-go" container yourself, rather than having the server do it. And, refrigerate leftovers as soon as you get home. . Choose restaurants that are clean and that are willing to prepare your  food as you order it cooked.   AT HOME MEDICATIONS:                                                                                                                                                                Compazine/Prochlorperazine 10mg  tablet. Take 1 tablet every 6 hours as needed for nausea/vomiting. (This can make you sleepy)   EMLA cream. Apply a quarter size amount to port site 1 hour prior to chemo. Do not rub in. Cover with plastic wrap.    Diarrhea Sheet   If you are having loose stools/diarrhea, please purchase Imodium and begin taking as outlined:  At the first sign of poorly formed or loose stools you should begin taking Imodium (loperamide) 2 mg capsules.  Take two tablets (4mg ) followed by one tablet (2mg ) every 2 hours - DO NOT EXCEED 8 tablets in 24 hours.  If it is bedtime and you are having loose stools, take 2 tablets at bedtime, then 2 tablets every 4 hours until morning.   Always call the [facility] if you are having loose stools/diarrhea that you can't get under control.  Loose stools/diarrhea leads to dehydration (loss of water) in your body.  We have other options of trying to get the loose stools/diarrhea to stop but you must let us know!   Constipation Sheet  Colace - 100 mg capsules - take 2 capsules daily.  If this doesn't help then you can increase to 2 capsules twice daily.  Please call if the above does not work for you. Do not go more than 2 days without a bowel movement.  It is very important that you do not become constipated.  It will make you feel sick to your stomach (nausea) and can cause abdominal pain and vomiting.  Nausea Sheet   Compazine/Prochlorperazine 10mg  tablet. Take 1 tablet every 6 hours as needed for nausea/vomiting (This can make you drowsy).  If you are having persistent nausea (nausea that does not stop) please call the [facility] and let us know  the amount of nausea that you are experiencing.  If you begin to vomit, you  need to call the [facility] and if it is the weekend and you have vomited more than one time and can't get it to stop-go to the Emergency Room.  Persistent nausea/vomiting can lead to dehydration (loss of fluid in your body) and will make you feel very weak and unwell. Ice chips, sips of clear liquids, foods that are at room temperature, crackers, and toast tend to be better tolerated.   SYMPTOMS TO REPORT AS SOON AS POSSIBLE AFTER TREATMENT:  FEVER GREATER THAN 100.5 F  CHILLS WITH OR WITHOUT FEVER  NAUSEA AND VOMITING THAT IS NOT CONTROLLED WITH YOUR NAUSEA MEDICATION  UNUSUAL SHORTNESS OF BREATH  UNUSUAL BRUISING OR BLEEDING  TENDERNESS IN MOUTH AND THROAT WITH OR WITHOUT   PRESENCE OF ULCERS  URINARY PROBLEMS  BOWEL PROBLEMS  UNUSUAL RASH     Wear comfortable clothing and clothing appropriate for easy access to any Portacath or PICC line. Let us know if there is anything that we can do to make your therapy better!    What to do if you need assistance after hours or on the weekends: CALL [phone].  HOLD on the line, do not hang up.  You will hear multiple messages but at the end you will be connected with a nurse triage line.  They will contact the doctor if necessary.  Most of the time they will be able to assist you.  Do not call the hospital operator.      I have been informed and understand all of the instructions given to me and have received a copy. I have been instructed to call the clinic [phone] or my family physician as soon as possible for continued medical care, if indicated. I do not have any more questions at this time but understand that I may call the [facility] or the Patient Navigator at [phone] during office hours should I have questions or need assistance in obtaining follow-up care.

## 2019-04-28 NOTE — Progress Notes (Signed)
Patient called to ask if he needed to hold his Plavix and Aspirin for his procedure next week.     I spoke with Myriam Jacobson, [place] with radiology at [facility] and she advised that the new protocol for IR is that patients do not have to hold plavix or aspirin before the procedures.    I have notified the patient that he DOES NOT have to hold his plavix or aspirin.  He verbalizes understanding.

## 2019-04-28 NOTE — Telephone Encounter (Signed)
Nutrition  Patient called to reschedule nutrition phone appointment from 2:30 on 3/12 to different time in the day.  Spoke with patient and RD will call for follow-up appointment on 3/12 at 11:30am.  Patient agreed.  Jesus Poplin B. Zenia Resides, [place], [place] Registered Dietitian [phone] (pager)

## 2019-04-29 NOTE — Assessment & Plan Note (Signed)
1.  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  No other metastatic disease seen. -Tonsillar biopsy on 04/03/2019 consistent with squamous cell carcinoma.  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 -He was evaluated by Dr. Nicolette Bang at [facility].  He was offered TORS resection of the primary and lymph node area.  Risks and benefits were discussed with the patient.  I have reviewed records from [facility]. -Patient also met with Dr.Yanagihara in [place]. -Because of the risks involved, he decided against surgery. -We talked about the normal prognosis of p16 positive oropharyngeal cancer.  I have recommended chemoradiation therapy with cisplatin. -We talked about the side effects of cisplatin in detail including but not limited to neuropathy, nephrotoxicity, ototoxicity, cytopenias among others. -We will use weekly cisplatin 40 mg per metered squared regimen for better tolerability.  We also talked about potential feeding tube placement if there is significant weight loss during treatment. -He will likely start chemo and radiation therapy during the week of 05/08/2019.  We will check his labs and see him back prior to his treatment.  2.  History of prostate cancer: -Gleason 9 prostate cancer, diagnosed in #2013, status post XRT from April 2014 through May 2014 with 2 years of Lupron. -He is having periodic PSAs checked and is continuing to be in remission.

## 2019-05-02 ENCOUNTER — Other Ambulatory Visit: Payer: Self-pay | Admitting: Student

## 2019-05-02 ENCOUNTER — Other Ambulatory Visit: Payer: Self-pay | Admitting: Radiology

## 2019-05-02 DIAGNOSIS — Z510 Encounter for antineoplastic radiation therapy: Secondary | ICD-10-CM | POA: Diagnosis not present

## 2019-05-02 DIAGNOSIS — C090 Malignant neoplasm of tonsillar fossa: Secondary | ICD-10-CM | POA: Diagnosis not present

## 2019-05-03 ENCOUNTER — Other Ambulatory Visit: Payer: Self-pay

## 2019-05-03 ENCOUNTER — Ambulatory Visit (HOSPITAL_COMMUNITY)
Admission: RE | Admit: 2019-05-03 | Discharge: 2019-05-03 | Disposition: A | Discharge status: Home / Self Care | Payer: Medicare Other | Source: Ambulatory Visit | Attending: Hematology | Admitting: Hematology

## 2019-05-03 DIAGNOSIS — C4492 Squamous cell carcinoma of skin, unspecified: Secondary | ICD-10-CM | POA: Diagnosis not present

## 2019-05-03 DIAGNOSIS — F17210 Nicotine dependence, cigarettes, uncomplicated: Secondary | ICD-10-CM | POA: Insufficient documentation

## 2019-05-03 DIAGNOSIS — Z452 Encounter for adjustment and management of vascular access device: Secondary | ICD-10-CM | POA: Diagnosis not present

## 2019-05-03 DIAGNOSIS — I739 Peripheral vascular disease, unspecified: Secondary | ICD-10-CM | POA: Insufficient documentation

## 2019-05-03 DIAGNOSIS — Z79899 Other long term (current) drug therapy: Secondary | ICD-10-CM | POA: Insufficient documentation

## 2019-05-03 DIAGNOSIS — C779 Secondary and unspecified malignant neoplasm of lymph node, unspecified: Secondary | ICD-10-CM | POA: Diagnosis not present

## 2019-05-03 DIAGNOSIS — J449 Chronic obstructive pulmonary disease, unspecified: Secondary | ICD-10-CM | POA: Diagnosis not present

## 2019-05-03 DIAGNOSIS — G2581 Restless legs syndrome: Secondary | ICD-10-CM | POA: Diagnosis not present

## 2019-05-03 DIAGNOSIS — K219 Gastro-esophageal reflux disease without esophagitis: Secondary | ICD-10-CM | POA: Diagnosis not present

## 2019-05-03 DIAGNOSIS — M810 Age-related osteoporosis without current pathological fracture: Secondary | ICD-10-CM | POA: Diagnosis not present

## 2019-05-03 DIAGNOSIS — E785 Hyperlipidemia, unspecified: Secondary | ICD-10-CM | POA: Diagnosis not present

## 2019-05-03 DIAGNOSIS — Z7902 Long term (current) use of antithrombotics/antiplatelets: Secondary | ICD-10-CM | POA: Insufficient documentation

## 2019-05-03 DIAGNOSIS — Z7982 Long term (current) use of aspirin: Secondary | ICD-10-CM | POA: Insufficient documentation

## 2019-05-03 DIAGNOSIS — F419 Anxiety disorder, unspecified: Secondary | ICD-10-CM | POA: Diagnosis not present

## 2019-05-03 DIAGNOSIS — Z8546 Personal history of malignant neoplasm of prostate: Secondary | ICD-10-CM | POA: Diagnosis not present

## 2019-05-03 DIAGNOSIS — Z5111 Encounter for antineoplastic chemotherapy: Secondary | ICD-10-CM | POA: Diagnosis not present

## 2019-05-03 HISTORY — PX: IR IMAGING GUIDED PORT INSERTION: IMG5740

## 2019-05-03 MED ORDER — HEPARIN SOD (PORK) LOCK FLUSH 100 UNIT/ML IV SOLN
INTRAVENOUS | Status: AC
  Administered 2019-05-03: 500 [IU]
  Filled 2019-05-03: qty 5

## 2019-05-03 MED ORDER — CEFAZOLIN SODIUM-DEXTROSE 2-4 GM/100ML-% IV SOLN: 2.0000 g | INTRAVENOUS | Status: AC

## 2019-05-03 MED ORDER — FENTANYL CITRATE (PF) 100 MCG/2ML IJ SOLN
INTRAMUSCULAR | Status: AC | PRN
  Administered 2019-05-03 (×2): 50 ug via INTRAVENOUS

## 2019-05-03 MED ORDER — LIDOCAINE-EPINEPHRINE 1 %-1:100000 IJ SOLN
INTRAMUSCULAR | Status: AC
  Filled 2019-05-03: qty 1

## 2019-05-03 MED ORDER — SODIUM CHLORIDE 0.9 % IV SOLN: INTRAVENOUS | Status: DC

## 2019-05-03 MED ORDER — MIDAZOLAM HCL 2 MG/2ML IJ SOLN
INTRAMUSCULAR | Status: AC | PRN
  Administered 2019-05-03 (×2): 1 mg via INTRAVENOUS

## 2019-05-03 MED ORDER — MIDAZOLAM HCL 2 MG/2ML IJ SOLN
INTRAMUSCULAR | Status: AC
  Filled 2019-05-03: qty 2

## 2019-05-03 MED ORDER — LIDOCAINE HCL 1 % IJ SOLN
INTRAMUSCULAR | Status: AC
  Filled 2019-05-03: qty 20

## 2019-05-03 MED ORDER — CEFAZOLIN (ANCEF) 1 G IV SOLR: INTRAVENOUS | Status: AC | PRN

## 2019-05-03 MED ORDER — FENTANYL CITRATE (PF) 100 MCG/2ML IJ SOLN
INTRAMUSCULAR | Status: AC
  Filled 2019-05-03: qty 2

## 2019-05-03 MED ORDER — CEFAZOLIN SODIUM-DEXTROSE 2-4 GM/100ML-% IV SOLN
INTRAVENOUS | Status: AC
  Administered 2019-05-03: 2 g via INTRAVENOUS
  Filled 2019-05-03: qty 100

## 2019-05-03 NOTE — Discharge Instructions (Signed)
Implanted Port Insertion, Care After °This sheet gives you information about how to care for yourself after your procedure. Your health care provider may also give you more specific instructions. If you have problems or questions, contact your health care provider. °What can I expect after the procedure? °After the procedure, it is common to have: °· Discomfort at the port insertion site. °· Bruising on the skin over the port. This should improve over 3-4 days. °Follow these instructions at home: °Port care °· After your port is placed, you will get a manufacturer's information card. The card has information about your port. Keep this card with you at all times. °· Take care of the port as told by your health care provider. Ask your health care provider if you or a family member can get training for taking care of the port at home. A home health care nurse may also take care of the port. °· Make sure to remember what type of port you have. °Incision care ° °  ° °· Follow instructions from your health care provider about how to take care of your port insertion site. Make sure you: °? Wash your hands with soap and water before and after you change your bandage (dressing). If soap and water are not available, use hand sanitizer. °? Change your dressing as told by your health care provider. °? Leave stitches (sutures), skin glue, or adhesive strips in place. These skin closures may need to stay in place for 2 weeks or longer. If adhesive strip edges start to loosen and curl up, you may trim the loose edges. Do not remove adhesive strips completely unless your health care provider tells you to do that. °· Check your port insertion site every day for signs of infection. Check for: °? Redness, swelling, or pain. °? Fluid or blood. °? Warmth. °? Pus or a bad smell. °Activity °· Return to your normal activities as told by your health care provider. Ask your health care provider what activities are safe for you. °· Do not  lift anything that is heavier than 10 lb (4.5 kg), or the limit that you are told, until your health care provider says that it is safe. °General instructions °· Take over-the-counter and prescription medicines only as told by your health care provider. °· Do not take baths, swim, or use a hot tub until your health care provider approves. Ask your health care provider if you may take showers. You may only be allowed to take sponge baths. °· Do not drive for 24 hours if you were given a sedative during your procedure. °· Wear a medical alert bracelet in case of an emergency. This will tell any health care providers that you have a port. °· Keep all follow-up visits as told by your health care provider. This is important. °Contact a health care provider if: °· You cannot flush your port with saline as directed, or you cannot draw blood from the port. °· You have a fever or chills. °· You have redness, swelling, or pain around your port insertion site. °· You have fluid or blood coming from your port insertion site. °· Your port insertion site feels warm to the touch. °· You have pus or a bad smell coming from the port insertion site. °Get help right away if: °· You have chest pain or shortness of breath. °· You have bleeding from your port that you cannot control. °Summary °· Take care of the port as told by your health   care provider. Keep the manufacturer's information card with you at all times. °· Change your dressing as told by your health care provider. °· Contact a health care provider if you have a fever or chills or if you have redness, swelling, or pain around your port insertion site. °· Keep all follow-up visits as told by your health care provider. °This information is not intended to replace advice given to you by your health care provider. Make sure you discuss any questions you have with your health care provider. °Document Revised: 09/07/2017 Document Reviewed: 09/07/2017 °Elsevier Patient Education ©  2020 [employer]vier Inc. ° °

## 2019-05-03 NOTE — Progress Notes (Signed)
Discharge instructions reviewed with patient and wife. Verbalized understanding. 

## 2019-05-03 NOTE — H&P (Signed)
Chief Complaint: Patient was seen in consultation today for squamous cell carcinoma  Referring Physician(s): Katragadda,Sreedhar  Supervising Physician: Markus Daft  Patient Status: [facility] - Out-pt  History of Present Illness: Cory Foley is a 69 y.o. male with past medical history of prostate cancer, COPD, GERD, HLD, PAD, PVD recently known to Interventional Radiology from lymph node biopsy which has returned diagnostic for squamous cell carcinoma.  Patient now in need for Port-A-Cath placement for initiation of chemotherapy.   Patient presents in his usual state of health today.  He has been NPO.  He did hold his Plavix x3 days prior to procedure today.  He denies fever, chills, abdominal pain, nausea, cough, shortness of breath, dysuria.    Past Medical History:  Diagnosis Date  . Allergy   . Anxiety   . Arthritis   . BPH with obstruction/lower urinary tract symptoms   . COPD, mild ([place])    current smoker  . ED (erectile dysfunction)   . GERD (gastroesophageal reflux disease)   . Hyperlipidemia   . Nocturia   . Osteoporosis   . PAD (peripheral artery disease) ([place])   . Port-A-Cath in place 04/28/2019  . Prostate cancer ([place]) 01/11/2012   Adenocarcinoma, Gleason=4+4=8, & 4+5=9,PSA=17.24, Volume= 15.45cc  . PVD (peripheral vascular disease) ([place])   . Restless leg syndrome   . Tongue mass    SCC    Past Surgical History:  Procedure Laterality Date  . ABDOMINAL AORTOGRAM W/LOWER EXTREMITY N/A 08/16/2018   Procedure: ABDOMINAL AORTOGRAM W/LOWER EXTREMITY;  Surgeon: Serafina Mitchell, MD;  Location: [facility];  Service: Cardiovascular;  Laterality: N/A;  Bilateral  . COLONOSCOPY N/A 09/28/2012   Procedure: COLONOSCOPY;  Surgeon: Danie Binder, MD;  [facility];  Service: Endoscopy;  Laterality: N/A;  8:30  . DIRECT LARYNGOSCOPY N/A 04/03/2019   Procedure: DIRECT LARYNGOSCOPY;  Surgeon: Leta Baptist, MD;  Location: [facility];  Service: ENT;   Laterality: N/A;  . ESOPHAGOGASTRODUODENOSCOPY N/A 09/28/2012   Procedure: ESOPHAGOGASTRODUODENOSCOPY (EGD);  Surgeon: Danie Binder, MD;  [facility];  Service: Endoscopy;  Laterality: N/A;  . Gold seed implatation  04/28/2012  . HERNIA REPAIR     Bilateral inguinal X2  . JOINT REPLACEMENT     bilateral  . KNEE SURGERY     Left Knee X 2   and Right knee X1  . MALONEY DILATION N/A 09/28/2012   Procedure: MALONEY DILATION;  Surgeon: Danie Binder, MD;  [facility];  Service: Endoscopy;  Laterality: N/A;  . NOSE SURGERY    . PERIPHERAL VASCULAR INTERVENTION  08/16/2018   Procedure: PERIPHERAL VASCULAR INTERVENTION;  Surgeon: Serafina Mitchell, MD;  Location: [facility];  Service: Cardiovascular;;  LT Iliac  . PR VEIN BYPASS GRAFT,AORTO-FEM-POP  10/21/10   Left AK to BK popliteal BPG  . PROSTATE BIOPSY  01/11/2012   Adenocarcinoma  . PROSTATE SURGERY  2015   Chemo and  Radiation  . SAVORY DILATION N/A 09/28/2012   Procedure: SAVORY DILATION;  Surgeon: Danie Binder, MD;  [facility];  Service: Endoscopy;  Laterality: N/A;  . throat biopsy  03/20/2019  . TONSILLECTOMY Left 04/03/2019   Procedure: TONSILLECTOMY;  Surgeon: Leta Baptist, MD;  Location: [facility];  Service: ENT;  Laterality: Left;    Allergies: Codeine, Morphine and related, and Percocet [oxycodone-acetaminophen]  Medications: Prior to Admission medications   Medication Sig Start Date End Date Taking?  Authorizing Provider  aspirin EC 81 MG tablet Take 81 mg by mouth daily.   Yes [provider]  calcium carbonate (OS-CAL) 600 MG tablet Take 1 tablet (600 mg total) by mouth 2 (two) times daily with a meal. Patient taking differently: Take 600 mg by mouth 2 (two) times daily.  07/26/17  Yes [place], Modena Nunnery, MD  Cholecalciferol (VITAMIN D) 50 MCG (2000 UT) tablet Take 2,000 Units by mouth in the morning and at bedtime.   Yes [provider]  clopidogrel  (PLAVIX) 75 MG tablet Take 75 mg by mouth daily.  04/17/19  Yes [provider]  famotidine (PEPCID) 20 MG tablet TAKE 1 TABLET BY MOUTH AT BEDTIME. Patient taking differently: Take 20 mg by mouth at bedtime.  09/13/18  Yes Susy Frizzle, MD  HYDROcodone-acetaminophen (NORCO) 10-325 MG tablet Take 1 tablet by mouth every 4 (four) hours as needed. Chronic Pain. Dx: G89.4 Patient taking differently: Take 1 tablet by mouth every 4 (four) hours as needed for moderate pain.  04/24/19  Yes [place], Modena Nunnery, MD  mirtazapine (REMERON) 15 MG tablet TAKE 1 TABLET BY MOUTH AT BEDTIME. Patient taking differently: Take 15 mg by mouth at bedtime.  12/06/18  Yes [place], Modena Nunnery, MD  pantoprazole (PROTONIX) 40 MG tablet TAKE ONE TABLET BY MOUTH DAILY. Patient taking differently: Take 40 mg by mouth daily.  04/10/19  Yes [place], Modena Nunnery, MD  rOPINIRole (REQUIP) 3 MG tablet TAKE (1) TABLET BY MOUTH AT BEDTIME. Patient taking differently: Take 3 mg by mouth at bedtime.  04/25/19  Yes [place], Modena Nunnery, MD  simvastatin (ZOCOR) 40 MG tablet TAKE (1) TABLET BY MOUTH AT BEDTIME. Patient taking differently: Take 40 mg by mouth at bedtime.  12/13/18  Yes [place], Modena Nunnery, MD  topiramate (TOPAMAX) 25 MG tablet TAKE 3 TABLETS BY MOUTH DAILY. Patient taking differently: Take 50-75 mg by mouth at bedtime.  02/03/19  Yes [place], Modena Nunnery, MD  cholecalciferol (VITAMIN D) 1000 units tablet Take 1 tablet (1,000 Units total) by mouth daily. Patient not taking: Reported on 04/27/2019 07/26/17   Alycia Rossetti, MD  cyclobenzaprine (FLEXERIL) 5 MG tablet Take 5 mg by mouth at bedtime as needed for muscle spasms.     [provider]  methocarbamol (ROBAXIN) 500 MG tablet Take 500 mg by mouth every 6 (six) hours as needed for muscle spasms.     [provider]     Family History  Problem Relation Age of Onset  . Heart disease Mother        Valve regurgitation and Pacemaker   . Diabetes Mother   .  Hyperlipidemia Mother   . Heart disease Father        CABG x 5  . Hyperlipidemia Father   . Hypertension Father   . Heart attack Father   . Heart disease Sister        aortic valve replacement  . Cancer Sister 72       Colon cancer w/ metastasis  . Throat cancer Sister   . Hypertension Brother   . Heart disease Maternal Grandmother   . Heart disease Maternal Grandfather   . Heart disease Paternal Grandmother   . Cancer Paternal Grandfather   . Healthy Son   . Healthy Daughter     Social History   Socioeconomic History  . Marital status: Married    Spouse name: Holley Raring  . Number of children: 2  . Years of education: College  .  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 Smoking status: Current Every Day Smoker    Packs/day: 0.50    Years: 43.00    Pack years: 21.50    Types: Cigarettes  . Smokeless tobacco: Never Used  . Tobacco comment: 1/2 ppd  Substance and Sexual Activity  . Alcohol use: No    Comment: quit 1.5 years ago  . Drug use: No  . Sexual activity: Not Currently  Other Topics Concern  . Not on file  Social History Narrative   Patient lives at home with spouse.   Caffeine use: very little   Social Determinants of Health   Financial Resource Strain: Low Risk   . Difficulty of Paying Living Expenses: Not hard at all  Food Insecurity: No Food Insecurity  . Worried About Charity fundraiser in the Last Year: Never true  . Ran Out of Food in the Last Year: Never true  Transportation Needs: No Transportation Needs  . Lack of Transportation (Medical): No  . Lack of Transportation (Non-Medical): No  Physical Activity: Sufficiently Active  . Days of Exercise per Week: 5 days  . Minutes of Exercise per Session: 30 min  Stress: Stress Concern Present  . Feeling of Stress : Rather much  Social Connections: Slightly Isolated  .  Frequency of Communication with Friends and Family: Twice a week  . Frequency of Social Gatherings with Friends and Family: Twice a week  . Attends Religious Services: More than 4 times per year  . Active Member of Clubs or Organizations: No  . Attends Archivist Meetings: Never  . Marital Status: Married     Review of Systems: A 12 point ROS discussed and pertinent positives are indicated in the HPI above.  All other systems are negative.  Review of Systems  Constitutional: Negative for fatigue and fever.  Respiratory: Negative for cough and shortness of breath.   Cardiovascular: Negative for chest pain.  Gastrointestinal: Negative for abdominal pain, nausea and vomiting.  Genitourinary: Negative for dysuria.  Psychiatric/Behavioral: Negative for behavioral problems and confusion.    Vital Signs: BP 130/74 (BP Location: Left Arm)   Pulse (!) 56   Temp 97.9 F (36.6 C) (Oral)   Resp 16   Ht 5\' 8"  (1.727 m)   Wt 172 lb (78 kg)   SpO2 96%   BMI 26.15 kg/m   Physical Exam Vitals and nursing note reviewed.  Constitutional:      General: He is not in acute distress.    Appearance: Normal appearance. He is not ill-appearing.  HENT:     Mouth/Throat:     Mouth: Mucous membranes are moist.     Pharynx: Oropharynx is clear.  Cardiovascular:     Rate and Rhythm: Regular rhythm. Bradycardia present.  Pulmonary:     Effort: Pulmonary effort is normal. No respiratory distress.     Breath sounds: Normal breath sounds.  Abdominal:     General: Abdomen is flat. There is no distension.     Palpations: Abdomen is soft.  Lymphadenopathy:     Cervical: Cervical adenopathy (left mandible) present.  Skin:    General: Skin is warm and dry.  Neurological:     General: No focal deficit present.     Mental Status: He is alert and oriented to person, place, and time.  Mental status is at baseline.  Psychiatric:        Mood and Affect: Mood normal.        Behavior: Behavior  normal.        Thought Content: Thought content normal.        Judgment: Judgment normal.      MD Evaluation Airway: WNL Heart: WNL Abdomen: WNL Chest/ Lungs: WNL ASA  Classification: 3 Mallampati/Airway Score: Two   Imaging: No results found.  Labs:  CBC: Recent Labs    07/13/18 1040 07/13/18 1040 07/21/18 0915 08/16/18 0638 12/26/18 1015 02/20/19 1548  WBC 11.2*  --  6.8  --  5.9 6.7  HGB 12.9*   < > 12.9* 13.6 12.7* 12.8*  HCT 40.0   < > 38.6 40.0 37.6* 38.5  PLT 204  --  170  --  208 209   < > = values in this interval not displayed.    COAGS: No results for input(s): INR, APTT in the last 8760 hours.  BMP: Recent Labs    07/13/18 1040 08/16/18 0638 12/26/18 1015 02/15/19 0815 02/20/19 1548  NA 141 141 141  --  141  K 4.9 4.6 4.4  --  4.1  CL 106 111 112*  --  108  CO2 23  --  22  --  24  GLUCOSE 90 97 85  --  72  BUN 11 9 12   --  11  CALCIUM 9.8  --  9.6  --  9.2  CREATININE 0.82 0.90 1.05 0.90 0.97    LIVER FUNCTION TESTS: Recent Labs    07/13/18 1040 12/26/18 1015 02/20/19 1548  BILITOT 0.3 0.3 0.3  AST 12 12 13   ALT 8* 9 8*  PROT 6.7 6.5 6.3    TUMOR MARKERS: No results for input(s): AFPTM, CEA, CA199, CHROMGRNA in the last 8760 hours.  Assessment and Plan: Patient with past medical history of squamous cell carcinoma presents in need of venous access for chemotherapy.  IR consulted for Port-A-Cath placement at the request of Dr. Delton Coombes. Case reviewed by Dr. Anselm Pancoast who approves patient for procedure.  Patient presents today in their usual state of health.  She has been NPO and did hold his Plavix for 3 days.  Note he does have bradycardia, however tolerated sedation well during lymph node biopsy 03/15/19.  Risks and benefits of image guided port-a-catheter placement was discussed with the patient including, but not limited to bleeding, infection, pneumothorax, or fibrin sheath development and need for additional  procedures.  All of the patient's questions were answered, patient is agreeable to proceed. Consent signed and in chart.    Thank you for this interesting consult.  I greatly enjoyed meeting Cory Foley and look forward to participating in their care.  A copy of this report was sent to the requesting provider on this date.  Electronically Signed: Docia Barrier, PA 05/03/2019, 8:54 AM   I spent a total of  30 Minutes   in face to face in clinical consultation, greater than 50% of which was counseling/coordinating care for squamous cell carcinoma.

## 2019-05-03 NOTE — Procedures (Signed)
Interventional Radiology Procedure:   Indications: Squamous cell carcinoma  Procedure: Port placement  Findings: Right jugular port, tip at SVC/RA junction  Complications: None     EBL: Minimal, less than 10 ml  Plan: Discharge in one hour.  Keep port site and incisions dry for at least 24 hours.     Eleesha Purkey R. Anselm Pancoast, MD  Pager: [phone]

## 2019-05-04 ENCOUNTER — Ambulatory Visit (HOSPITAL_COMMUNITY): Payer: Medicare Other | Admitting: Hematology

## 2019-05-04 ENCOUNTER — Other Ambulatory Visit (HOSPITAL_COMMUNITY): Payer: Medicare Other

## 2019-05-04 ENCOUNTER — Inpatient Hospital Stay (HOSPITAL_COMMUNITY): Payer: Medicare Other

## 2019-05-04 DIAGNOSIS — Z95828 Presence of other vascular implants and grafts: Secondary | ICD-10-CM

## 2019-05-04 DIAGNOSIS — C090 Malignant neoplasm of tonsillar fossa: Secondary | ICD-10-CM | POA: Diagnosis not present

## 2019-05-04 DIAGNOSIS — C099 Malignant neoplasm of tonsil, unspecified: Secondary | ICD-10-CM

## 2019-05-04 DIAGNOSIS — Z510 Encounter for antineoplastic radiation therapy: Secondary | ICD-10-CM | POA: Diagnosis not present

## 2019-05-04 MED ORDER — LIDOCAINE-PRILOCAINE 2.5-2.5 % EX CREA: TOPICAL_CREAM | CUTANEOUS | 3 refills | Status: DC

## 2019-05-04 MED ORDER — PROCHLORPERAZINE MALEATE 10 MG PO TABS: 10.0000 mg | ORAL_TABLET | Freq: Four times a day (QID) | ORAL | 1 refills | Status: DC | PRN

## 2019-05-04 NOTE — Progress Notes (Signed)

## 2019-05-05 ENCOUNTER — Other Ambulatory Visit: Payer: Self-pay | Admitting: Family Medicine

## 2019-05-05 ENCOUNTER — Ambulatory Visit (HOSPITAL_COMMUNITY): Payer: Medicare Other

## 2019-05-05 ENCOUNTER — Encounter (HOSPITAL_COMMUNITY): Payer: Medicare Other

## 2019-05-05 NOTE — Progress Notes (Signed)
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  Planning to start chemotherapy and radiation therapy on 3/15.  Patient has declined surgery.   Spoke with patient via phone today.  No change in appetite.  No trouble swallowing. Continues to drink boost 2 per day.      Medications: compazine  Labs: no new  Anthropometrics:   Weight 172 lb on 3/10, stable  165 lb on 12/26/2018, 162 lb 09/19/2018   NUTRITION DIAGNOSIS: Predicted suboptimal energy intake continues with starting treatment   INTERVENTION:  Patient to continue high calorie, high protein foods, changing consistency as needed as treatment progresses.   Patient to continue boost shakes Discussed importance of hydration with cisplatin. Reviewed some strategies to help with nausea. Recommend evaluation by SLP with radiation planned.  Will send message to MD.    Patient has RD contact information    MONITORING, EVALUATION, GOAL: Patient will consume adequate calories and protein to prevent weight loss during treatment   NEXT VISIT: March 26th, phone f/u   Cory Foley B. Zenia Resides, [place], [place] Registered Dietitian [phone] (pager)

## 2019-05-08 ENCOUNTER — Encounter (HOSPITAL_COMMUNITY): Payer: Self-pay | Admitting: Hematology

## 2019-05-08 ENCOUNTER — Inpatient Hospital Stay (HOSPITAL_COMMUNITY): Payer: Medicare Other

## 2019-05-08 ENCOUNTER — Other Ambulatory Visit: Payer: Self-pay

## 2019-05-08 ENCOUNTER — Inpatient Hospital Stay (HOSPITAL_COMMUNITY): Payer: Medicare Other | Admitting: Hematology

## 2019-05-08 VITALS — BP 110/64 | HR 57 | Temp 96.9°F | Resp 18 | Wt 173.0 lb

## 2019-05-08 VITALS — BP 98/50 | HR 57 | Temp 97.5°F | Resp 18

## 2019-05-08 DIAGNOSIS — Z833 Family history of diabetes mellitus: Secondary | ICD-10-CM | POA: Diagnosis not present

## 2019-05-08 DIAGNOSIS — I739 Peripheral vascular disease, unspecified: Secondary | ICD-10-CM | POA: Diagnosis not present

## 2019-05-08 DIAGNOSIS — C779 Secondary and unspecified malignant neoplasm of lymph node, unspecified: Secondary | ICD-10-CM

## 2019-05-08 DIAGNOSIS — Z95828 Presence of other vascular implants and grafts: Secondary | ICD-10-CM

## 2019-05-08 DIAGNOSIS — C099 Malignant neoplasm of tonsil, unspecified: Secondary | ICD-10-CM | POA: Diagnosis not present

## 2019-05-08 DIAGNOSIS — Z800 Family history of malignant neoplasm of digestive organs: Secondary | ICD-10-CM | POA: Diagnosis not present

## 2019-05-08 DIAGNOSIS — Z79899 Other long term (current) drug therapy: Secondary | ICD-10-CM | POA: Diagnosis not present

## 2019-05-08 DIAGNOSIS — Z510 Encounter for antineoplastic radiation therapy: Secondary | ICD-10-CM | POA: Diagnosis not present

## 2019-05-08 DIAGNOSIS — G2581 Restless legs syndrome: Secondary | ICD-10-CM | POA: Diagnosis not present

## 2019-05-08 DIAGNOSIS — C090 Malignant neoplasm of tonsillar fossa: Secondary | ICD-10-CM | POA: Diagnosis not present

## 2019-05-08 DIAGNOSIS — Z801 Family history of malignant neoplasm of trachea, bronchus and lung: Secondary | ICD-10-CM | POA: Diagnosis not present

## 2019-05-08 DIAGNOSIS — E785 Hyperlipidemia, unspecified: Secondary | ICD-10-CM | POA: Diagnosis not present

## 2019-05-08 DIAGNOSIS — C770 Secondary and unspecified malignant neoplasm of lymph nodes of head, face and neck: Secondary | ICD-10-CM | POA: Diagnosis not present

## 2019-05-08 DIAGNOSIS — Z8249 Family history of ischemic heart disease and other diseases of the circulatory system: Secondary | ICD-10-CM | POA: Diagnosis not present

## 2019-05-08 DIAGNOSIS — Z923 Personal history of irradiation: Secondary | ICD-10-CM | POA: Diagnosis not present

## 2019-05-08 DIAGNOSIS — J449 Chronic obstructive pulmonary disease, unspecified: Secondary | ICD-10-CM | POA: Diagnosis not present

## 2019-05-08 DIAGNOSIS — Z5111 Encounter for antineoplastic chemotherapy: Secondary | ICD-10-CM | POA: Diagnosis not present

## 2019-05-08 LAB — CBC WITH DIFFERENTIAL/PLATELET
Abs Immature Granulocytes: 0.02 10*3/uL (ref 0.00–0.07)
Basophils Absolute: 0 10*3/uL (ref 0.0–0.1)
Basophils Relative: 0 %
Eosinophils Absolute: 0.1 10*3/uL (ref 0.0–0.5)
Eosinophils Relative: 1 %
HCT: 39.3 % (ref 39.0–52.0)
Hemoglobin: 12.3 g/dL — ABNORMAL LOW (ref 13.0–17.0)
Immature Granulocytes: 0 %
Lymphocytes Relative: 26 %
Lymphs Abs: 1.4 10*3/uL (ref 0.7–4.0)
MCH: 32 pg (ref 26.0–34.0)
MCHC: 31.3 g/dL (ref 30.0–36.0)
MCV: 102.3 fL — ABNORMAL HIGH (ref 80.0–100.0)
Monocytes Absolute: 0.4 10*3/uL (ref 0.1–1.0)
Monocytes Relative: 8 %
Neutro Abs: 3.5 10*3/uL (ref 1.7–7.7)
Neutrophils Relative %: 65 %
Platelets: 200 10*3/uL (ref 150–400)
RBC: 3.84 MIL/uL — ABNORMAL LOW (ref 4.22–5.81)
RDW: 14.3 % (ref 11.5–15.5)
WBC: 5.4 10*3/uL (ref 4.0–10.5)
nRBC: 0 % (ref 0.0–0.2)

## 2019-05-08 LAB — COMPREHENSIVE METABOLIC PANEL
ALT: 12 U/L (ref 0–44)
AST: 15 U/L (ref 15–41)
Albumin: 3.8 g/dL (ref 3.5–5.0)
Alkaline Phosphatase: 57 U/L (ref 38–126)
Anion gap: 7 (ref 5–15)
BUN: 8 mg/dL (ref 8–23)
CO2: 23 mmol/L (ref 22–32)
Calcium: 8.9 mg/dL (ref 8.9–10.3)
Chloride: 111 mmol/L (ref 98–111)
Creatinine, Ser: 0.83 mg/dL (ref 0.61–1.24)
GFR calc Af Amer: >60 mL/min (ref >60)
GFR calc non Af Amer: >60 mL/min (ref >60)
Glucose, Bld: 94 mg/dL (ref 70–99)
Potassium: 4.1 mmol/L (ref 3.5–5.1)
Sodium: 141 mmol/L (ref 135–145)
Total Bilirubin: 0.4 mg/dL (ref 0.3–1.2)
Total Protein: 6.6 g/dL (ref 6.5–8.1)

## 2019-05-08 MED ORDER — SODIUM CHLORIDE 0.9% FLUSH
10.0000 mL | INTRAVENOUS | Status: DC | PRN
  Administered 2019-05-08: 10 mL

## 2019-05-08 MED ORDER — POTASSIUM CHLORIDE 2 MEQ/ML IV SOLN
Freq: Once | INTRAVENOUS | Status: AC
  Filled 2019-05-08: qty 10

## 2019-05-08 MED ORDER — HEPARIN SOD (PORK) LOCK FLUSH 100 UNIT/ML IV SOLN
500.0000 [IU] | Freq: Once | INTRAVENOUS | Status: AC | PRN
  Administered 2019-05-08: 500 [IU]

## 2019-05-08 MED ORDER — PALONOSETRON HCL INJECTION 0.25 MG/5ML
0.2500 mg | Freq: Once | INTRAVENOUS | Status: AC
  Administered 2019-05-08: 0.25 mg via INTRAVENOUS
  Filled 2019-05-08: qty 5

## 2019-05-08 MED ORDER — SODIUM CHLORIDE 0.9 % IV SOLN
40.0000 mg/m2 | Freq: Once | INTRAVENOUS | Status: AC
  Administered 2019-05-08: 77 mg via INTRAVENOUS
  Filled 2019-05-08: qty 77

## 2019-05-08 MED ORDER — SODIUM CHLORIDE 0.9 % IV SOLN
10.0000 mg | Freq: Once | INTRAVENOUS | Status: AC
  Administered 2019-05-08: 10 mg via INTRAVENOUS
  Filled 2019-05-08: qty 10

## 2019-05-08 MED ORDER — SODIUM CHLORIDE 0.9 % IV SOLN: Freq: Once | INTRAVENOUS | Status: AC

## 2019-05-08 MED ORDER — SODIUM CHLORIDE 0.9 % IV SOLN: INTRAVENOUS | Status: DC

## 2019-05-08 MED ORDER — SODIUM CHLORIDE 0.9 % IV SOLN
150.0000 mg | Freq: Once | INTRAVENOUS | Status: AC
  Administered 2019-05-08: 150 mg via INTRAVENOUS
  Filled 2019-05-08: qty 150

## 2019-05-08 NOTE — Patient Instructions (Signed)
[facility] Discharge Instructions for Patients Receiving Chemotherapy  Today you received the following chemotherapy agents   To help prevent nausea and vomiting after your treatment, we encourage you to take your nausea medication   If you develop nausea and vomiting that is not controlled by your nausea medication, call the clinic.   BELOW ARE SYMPTOMS THAT SHOULD BE REPORTED IMMEDIATELY:  *FEVER GREATER THAN 100.5 F  *CHILLS WITH OR WITHOUT FEVER  NAUSEA AND VOMITING THAT IS NOT CONTROLLED WITH YOUR NAUSEA MEDICATION  *UNUSUAL SHORTNESS OF BREATH  *UNUSUAL BRUISING OR BLEEDING  TENDERNESS IN MOUTH AND THROAT WITH OR WITHOUT PRESENCE OF ULCERS  *URINARY PROBLEMS  *BOWEL PROBLEMS  UNUSUAL RASH Items with * indicate a potential emergency and should be followed up as soon as possible.  Feel free to call the clinic should you have any questions or concerns. The clinic phone number is [phone].  Please show the CHEMO ALERT CARD at check-in to the Emergency Department and triage nurse.   

## 2019-05-08 NOTE — Progress Notes (Signed)
At the request of our dietician Jennet Maduro, patient was referred to SLP for evaluation as patient is beginning chemotherapy/radiation treatments.

## 2019-05-08 NOTE — Patient Instructions (Signed)
[facility] Discharge Instructions  Labs drawn from portacath today   Thank you for choosing [facility] to provide your oncology and hematology care.  To afford each patient quality time with our provider, please arrive at least 15 minutes before your scheduled appointment time.   If you have a lab appointment with the Cancer Center please come in thru the Main Entrance and check in at the main information desk.  You need to re-schedule your appointment should you arrive 10 or more minutes late.  We strive to give you quality time with our providers, and arriving late affects you and other patients whose appointments are after yours.  Also, if you no show three or more times for appointments you may be dismissed from the clinic at the providers discretion.     Again, thank you for choosing [facility].  Our hope is that these requests will decrease the amount of time that you wait before being seen by our physicians.       _____________________________________________________________  Should you have questions after your visit to [facility], please contact our office at [phone] between the hours of 8:00 a.m. and 4:30 p.m.  Voicemails left after 4:00 p.m. will not be returned until the following business day.  For prescription refill requests, have your pharmacy contact our office and allow 72 hours.    Due to Covid, you will need to wear a mask upon entering the hospital. If you do not have a mask, a mask will be given to you at the Main Entrance upon arrival. For doctor visits, patients may have 1 support person with them. For treatment visits, patients can not have anyone with them due to social distancing guidelines and our immunocompromised population.     

## 2019-05-08 NOTE — Assessment & Plan Note (Addendum)
1.  T2N1 squamous cell carcinoma left tonsil, p16 positive: -PET scan on 03/27/2019 showed uptake in the left tonsillar fossa and 1.5 cm lymph node in the left level 2 area.  No other metastatic disease. -Tonsillar biopsy on 04/03/2019 consistent with squamous cell carcinoma.  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 -He was offered TORS and neck dissection, without without adjuvant radiation.  Patient thought about it and did not want to pursue the surgical option. -He started radiation therapy today. -We have reviewed his labs.  White count, LFTs and creatinine were normal.  We will make a referral to speech and language pathology. -He will proceed with his first weekly cisplatin 40 mg per metered squared today.  We reviewed the side effects including but not limited to nephrotoxicity, ototoxicity and neurotoxicity.  He was advised to drink lots of fluids.  He will come back tomorrow for more fluids. -I will reevaluate him in 1 week.  2.  History of prostate cancer: -Gleason 9 prostate cancer, diagnosed in November 2013, status post XRT from April 2014 through May 2014 with 2 years of Lupron.  He is having periodic PSAs checked and is continuing to be in remission.

## 2019-05-08 NOTE — Progress Notes (Signed)
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  CURRENT THERAPY: Chemoradiation therapy.  BRIEF ONCOLOGIC HISTORY:  Oncology History  Tonsillar cancer ([place])  04/07/2019 Initial Diagnosis   Tonsillar cancer ([place])   05/08/2019 -  Chemotherapy   The patient had palonosetron (ALOXI) injection 0.25 mg, 0.25 mg, Intravenous,  Once, 1 of 7 cycles Administration: 0.25 mg (05/08/2019) CISplatin (PLATINOL) 77 mg in sodium chloride 0.9 % 250 mL chemo infusion, 40 mg/m2 = 77 mg, Intravenous,  Once, 1 of 7 cycles Administration: 77 mg (05/08/2019) fosaprepitant (EMEND) 150 mg in sodium chloride 0.9 % 145 mL IVPB, 150 mg, Intravenous,  Once, 1 of 7 cycles Administration: 150 mg (05/08/2019)  for chemotherapy treatment.       CANCER STAGING: Cancer Staging Tonsillar cancer ([place]) Staging form: Pharynx - HPV-Mediated Oropharynx, AJCC 8th Edition - Clinical stage from 04/07/2019: Stage I (cT2, cN1, cM0, p16+) - Unsigned    INTERVAL HISTORY:  Cory Foley 69 y.o. male seen for follow-up of left tonsillar cancer.  He is starting radiation therapy today.  His appetite is 100%.  Energy levels are 25%.  No pain is reported.  Denies any tingling or numbness in the extremities.  Denies any ringing in the ears or hearing loss.  Denies any dysphagia at this time.    REVIEW OF SYSTEMS:  Review of Systems  All other systems reviewed and are negative.    PAST MEDICAL/SURGICAL HISTORY:  Past Medical History:  Diagnosis Date  . Allergy   . Anxiety   . Arthritis   . BPH with obstruction/lower urinary tract symptoms   . COPD, mild ([place])    current smoker  . ED (erectile dysfunction)   . GERD (gastroesophageal reflux disease)   . Hyperlipidemia   . Nocturia   . Osteoporosis     . PAD (peripheral artery disease) ([place])   . Port-A-Cath in place 04/28/2019  . Prostate cancer ([place]) 01/11/2012   Adenocarcinoma, Gleason=4+4=8, & 4+5=9,PSA=17.24, Volume= 15.45cc  . PVD (peripheral vascular disease) ([place])   . Restless leg syndrome   . Tongue mass    SCC   Past Surgical History:  Procedure Laterality Date  . ABDOMINAL AORTOGRAM W/LOWER EXTREMITY N/A 08/16/2018   Procedure: ABDOMINAL AORTOGRAM W/LOWER EXTREMITY;  Surgeon: Serafina Mitchell, MD;  Location: [facility];  Service: Cardiovascular;  Laterality: N/A;  Bilateral  . COLONOSCOPY N/A 09/28/2012   Procedure: COLONOSCOPY;  Surgeon: Danie Binder, MD;  [facility];  Service: Endoscopy;  Laterality: N/A;  8:30  . DIRECT LARYNGOSCOPY N/A 04/03/2019   Procedure: DIRECT LARYNGOSCOPY;  Surgeon: Leta Baptist, MD;  Location: [facility];  Service: ENT;  Laterality: N/A;  . ESOPHAGOGASTRODUODENOSCOPY N/A 09/28/2012   Procedure: ESOPHAGOGASTRODUODENOSCOPY (EGD);  Surgeon: Danie Binder, MD;  [facility];  Service: Endoscopy;  Laterality: N/A;  . Gold seed implatation  04/28/2012  . HERNIA REPAIR     Bilateral inguinal X2  . IR IMAGING GUIDED PORT INSERTION  05/03/2019  . JOINT REPLACEMENT     bilateral  . KNEE SURGERY     Left Knee X 2   and Right knee X1  . MALONEY DILATION N/A 09/28/2012   Procedure: Venia Minks DILATION;  Surgeon: Danie Binder, MD;  [facility];  Service: Endoscopy;  Laterality: N/A;  . NOSE SURGERY    . PERIPHERAL VASCULAR INTERVENTION  08/16/2018   Procedure: PERIPHERAL VASCULAR INTERVENTION;  Surgeon: Serafina Mitchell, MD;  Location: [facility];  Service: Cardiovascular;;  LT Iliac  . PR VEIN BYPASS GRAFT,AORTO-FEM-POP  10/21/10   Left AK to BK popliteal BPG  . PROSTATE BIOPSY  01/11/2012   Adenocarcinoma  . PROSTATE SURGERY  2015   Chemo and  Radiation  . SAVORY DILATION N/A 09/28/2012   Procedure: SAVORY DILATION;  Surgeon: Danie Binder, MD;   [facility];  Service: Endoscopy;  Laterality: N/A;  . throat biopsy  03/20/2019  . TONSILLECTOMY Left 04/03/2019   Procedure: TONSILLECTOMY;  Surgeon: Leta Baptist, MD;  Location: [facility];  Service: ENT;  Laterality: Left;     SOCIAL HISTORY:  Social History   Socioeconomic History  . Marital status: Married    Spouse name: Holley Raring  . Number of children: 2  . Years of education: College  . 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 Smoking status: Current Every Day Smoker    Packs/day: 0.50    Years: 43.00    Pack years: 21.50    Types: Cigarettes  . Smokeless tobacco: Never Used  . Tobacco comment: 1/2 ppd  Substance and Sexual Activity  . Alcohol use: No    Comment: quit 1.5 years ago  . Drug use: No  . Sexual activity: Not Currently  Other Topics Concern  . Not on file  Social History Narrative   Patient lives at home with spouse.   Caffeine use: very little   Social Determinants of Health   Financial Resource Strain: Low Risk   . Difficulty of Paying Living Expenses: Not hard at all  Food Insecurity: No Food Insecurity  . Worried About Charity fundraiser in the Last Year: Never true  . Ran Out of Food in the Last Year: Never true  Transportation Needs: No Transportation Needs  . Lack of Transportation (Medical): No  . Lack of Transportation (Non-Medical): No  Physical Activity: Sufficiently Active  . Days of Exercise per Week: 5 days  . Minutes of Exercise per Session: 30 min  Stress: Stress Concern Present  . Feeling of Stress : Rather much  Social Connections: Slightly Isolated  . Frequency of Communication with Friends and Family: Twice a week  . Frequency of Social Gatherings with Friends and Family: Twice a week  . Attends Religious Services: More than 4 times per year  . Active Member of Clubs or  Organizations: No  . Attends Archivist Meetings: Never  . Marital Status: Married  Human resources officer Violence: Not At Risk  . Fear of Current or Ex-Partner: No  . Emotionally Abused: No  . Physically Abused: No  . Sexually Abused: No    FAMILY HISTORY:  Family History  Problem Relation Age of Onset  . Heart disease Mother        Valve regurgitation and Pacemaker   . Diabetes Mother   . Hyperlipidemia Mother   . Heart disease Father        CABG x 5  . Hyperlipidemia Father   . Hypertension Father   . Heart attack Father   . Heart disease Sister  aortic valve replacement  . Cancer Sister 60       Colon cancer w/ metastasis  . Throat cancer Sister   . Hypertension Brother   . Heart disease Maternal Grandmother   . Heart disease Maternal Grandfather   . Heart disease Paternal Grandmother   . Cancer Paternal Grandfather   . Healthy Son   . Healthy Daughter     CURRENT MEDICATIONS:  Outpatient Encounter Medications as of 05/08/2019  Medication Sig Note  . aspirin EC 81 MG tablet Take 81 mg by mouth daily.   . calcium carbonate (OS-CAL) 600 MG tablet Take 1 tablet (600 mg total) by mouth 2 (two) times daily with a meal. (Patient taking differently: Take 600 mg by mouth 2 (two) times daily. )   . Cholecalciferol (VITAMIN D) 50 MCG (2000 UT) tablet Take 2,000 Units by mouth in the morning and at bedtime.   Marland Kitchen CISPLATIN IV Inject into the vein once a week.   . clopidogrel (PLAVIX) 75 MG tablet Take 75 mg by mouth daily.    . famotidine (PEPCID) 20 MG tablet TAKE 1 TABLET BY MOUTH AT BEDTIME. (Patient taking differently: Take 20 mg by mouth at bedtime. )   . mirtazapine (REMERON) 15 MG tablet Take 1 tablet (15 mg total) by mouth at bedtime.   . pantoprazole (PROTONIX) 40 MG tablet TAKE ONE TABLET BY MOUTH DAILY. (Patient taking differently: Take 40 mg by mouth daily. )   . rOPINIRole (REQUIP) 3 MG tablet TAKE (1) TABLET BY MOUTH AT BEDTIME. (Patient taking  differently: Take 3 mg by mouth at bedtime. )   . simvastatin (ZOCOR) 40 MG tablet TAKE (1) TABLET BY MOUTH AT BEDTIME. (Patient taking differently: Take 40 mg by mouth at bedtime. )   . topiramate (TOPAMAX) 25 MG tablet TAKE 3 TABLETS BY MOUTH DAILY. (Patient taking differently: Take 50-75 mg by mouth at bedtime. ) 04/27/2019: If pt is having frequent migraines he'll take 75 mg at night, if not he'll take 50 mg at night  . cyclobenzaprine (FLEXERIL) 5 MG tablet Take 5 mg by mouth at bedtime as needed for muscle spasms.    Marland Kitchen HYDROcodone-acetaminophen (NORCO) 10-325 MG tablet Take 1 tablet by mouth every 4 (four) hours as needed. Chronic Pain. Dx: G89.4 (Patient not taking: Reported on 05/08/2019)   . lidocaine-prilocaine (EMLA) cream Apply a pea-sized amount to port a cath site and cover with plastic wrap 1 hour prior to appointment (Patient not taking: Reported on 05/08/2019)   . methocarbamol (ROBAXIN) 500 MG tablet Take 500 mg by mouth every 6 (six) hours as needed for muscle spasms.    . prochlorperazine (COMPAZINE) 10 MG tablet Take 1 tablet (10 mg total) by mouth every 6 (six) hours as needed (Nausea or vomiting). (Patient not taking: Reported on 05/08/2019)    No facility-administered encounter medications on file as of 05/08/2019.    ALLERGIES:  Allergies  Allergen Reactions  . Codeine Other (See Comments)    Bad headache and sweats  . Morphine And Related Itching    sweats  . Percocet [Oxycodone-Acetaminophen] Other (See Comments)    Headache and sweats     PHYSICAL EXAM:  ECOG Performance status: 1  Vitals:   05/08/19 0915  BP: 110/64  Pulse: (!) 57  Resp: 18  Temp: (!) 96.9 F (36.1 C)  SpO2: 99%   Filed Weights   05/08/19 0915  Weight: 173 lb (78.5 kg)    Physical Exam Vitals reviewed.  Constitutional:  Appearance: Normal appearance.  HENT:     Mouth/Throat:     Mouth: Mucous membranes are moist.  Cardiovascular:     Rate and Rhythm: Normal rate and regular  rhythm.     Heart sounds: Normal heart sounds.  Pulmonary:     Effort: Pulmonary effort is normal.     Breath sounds: Normal breath sounds.  Abdominal:     General: There is no distension.     Palpations: Abdomen is soft.  Lymphadenopathy:     Cervical: Cervical adenopathy present.  Skin:    General: Skin is warm.  Neurological:     General: No focal deficit present.     Mental Status: He is alert and oriented to person, place, and time.  Psychiatric:        Mood and Affect: Mood normal.        Behavior: Behavior normal.      LABORATORY DATA:  I have reviewed the labs as listed.  CBC    Component Value Date/Time   WBC 5.4 05/08/2019 0919   RBC 3.84 (L) 05/08/2019 0919   HGB 12.3 (L) 05/08/2019 0919   HCT 39.3 05/08/2019 0919   PLT 200 05/08/2019 0919   MCV 102.3 (H) 05/08/2019 0919   MCH 32.0 05/08/2019 0919   MCHC 31.3 05/08/2019 0919   RDW 14.3 05/08/2019 0919   LYMPHSABS 1.4 05/08/2019 0919   MONOABS 0.4 05/08/2019 0919   EOSABS 0.1 05/08/2019 0919   BASOSABS 0.0 05/08/2019 0919   CMP Latest Ref Rng & Units 05/08/2019 02/20/2019 02/15/2019  Glucose 70 - 99 mg/dL 94 72 -  BUN 8 - 23 mg/dL 8 11 -  Creatinine 0.61 - 1.24 mg/dL 0.83 0.97 0.90  Sodium 135 - 145 mmol/L 141 141 -  Potassium 3.5 - 5.1 mmol/L 4.1 4.1 -  Chloride 98 - 111 mmol/L 111 108 -  CO2 22 - 32 mmol/L 23 24 -  Calcium 8.9 - 10.3 mg/dL 8.9 9.2 -  Total Protein 6.5 - 8.1 g/dL 6.6 6.3 -  Total Bilirubin 0.3 - 1.2 mg/dL 0.4 0.3 -  Alkaline Phos 38 - 126 U/L 57 - -  AST 15 - 41 U/L 15 13 -  ALT 0 - 44 U/L 12 8(L) -       DIAGNOSTIC IMAGING:  I have independently reviewed the scans.    ASSESSMENT & PLAN:   Tonsillar cancer ([place]) 1.  T2N1 squamous cell carcinoma left tonsil, p16 positive: -PET scan on 03/27/2019 showed uptake in the left tonsillar fossa and 1.5 cm lymph node in the left level 2 area.  No other metastatic disease. -Tonsillar biopsy on 04/03/2019 consistent with squamous cell  carcinoma.  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 -He was offered TORS and neck dissection, without without adjuvant radiation.  Patient thought about it and did not want to pursue the surgical option. -He started radiation therapy today. -We have reviewed his labs.  White count, LFTs and creatinine were normal.  We will make a referral to speech and language pathology. -He will proceed with his first weekly cisplatin 40 mg per metered squared today.  We reviewed the side effects including but not limited to nephrotoxicity, ototoxicity and neurotoxicity.  He was advised to drink lots of fluids.  He will come back tomorrow for more fluids. -I will reevaluate him in 1 week.  2.  History of prostate cancer: -Gleason 9 prostate cancer, diagnosed in November 2013, status post XRT  from April 2014 through May 2014 with 2 years of Lupron.  He is having periodic PSAs checked and is continuing to be in remission.      Orders placed this encounter:  Orders Placed This Encounter  Procedures  . CBC with Differential/Platelet  . Comprehensive metabolic panel  . Magnesium      Derek Jack, MD [place] [phone]

## 2019-05-08 NOTE — Progress Notes (Signed)
Labs reviewed today with MD. Proceed with day one of treatment as planned.   Treatment given per orders. Patient tolerated it well without problems. Vitals stable and discharged home from clinic ambulatory. Follow up as scheduled.

## 2019-05-08 NOTE — Patient Instructions (Addendum)
[place] at [facility] Discharge Instructions  You were seen today by Dr. Delton Coombes. He went over your recent lab results. Be sure that you drink at least 3 liters of fluid a day. You will start your treatment today. He will schedule you tomorrow for fluids. He will see you back in 1 week for labs, treatment and follow up.   Thank you for choosing [place] at [facility] to provide your oncology and hematology care.  To afford each patient quality time with our provider, please arrive at least 15 minutes before your scheduled appointment time.   If you have a lab appointment with the [facility] please come in thru the  Main Entrance and check in at the main information desk  You need to re-schedule your appointment should you arrive 10 or more minutes late.  We strive to give you quality time with our providers, and arriving late affects you and other patients whose appointments are after yours.  Also, if you no show three or more times for appointments you may be dismissed from the clinic at the providers discretion.     Again, thank you for choosing [facility].  Our hope is that these requests will decrease the amount of time that you wait before being seen by our physicians.       _____________________________________________________________  Should you have questions after your visit to [facility], please contact our office at [phone] between the hours of 8:00 a.m. and 4:30 p.m.  Voicemails left after 4:00 p.m. will not be returned until the following business day.  For prescription refill requests, have your pharmacy contact our office and allow 72 hours.    Cancer Center Support Programs:   > Cancer Support Group  2nd Tuesday of the month 1pm-2pm, Journey Room

## 2019-05-09 ENCOUNTER — Inpatient Hospital Stay (HOSPITAL_COMMUNITY): Payer: Medicare Other

## 2019-05-09 ENCOUNTER — Encounter (HOSPITAL_COMMUNITY): Payer: Self-pay | Admitting: General Practice

## 2019-05-09 ENCOUNTER — Inpatient Hospital Stay (HOSPITAL_COMMUNITY): Payer: Medicare Other | Admitting: General Practice

## 2019-05-09 VITALS — BP 107/55 | HR 54 | Temp 96.9°F | Resp 18

## 2019-05-09 DIAGNOSIS — E860 Dehydration: Secondary | ICD-10-CM | POA: Insufficient documentation

## 2019-05-09 DIAGNOSIS — Z833 Family history of diabetes mellitus: Secondary | ICD-10-CM | POA: Diagnosis not present

## 2019-05-09 DIAGNOSIS — Z923 Personal history of irradiation: Secondary | ICD-10-CM | POA: Diagnosis not present

## 2019-05-09 DIAGNOSIS — G2581 Restless legs syndrome: Secondary | ICD-10-CM | POA: Diagnosis not present

## 2019-05-09 DIAGNOSIS — Z801 Family history of malignant neoplasm of trachea, bronchus and lung: Secondary | ICD-10-CM | POA: Diagnosis not present

## 2019-05-09 DIAGNOSIS — J449 Chronic obstructive pulmonary disease, unspecified: Secondary | ICD-10-CM | POA: Diagnosis not present

## 2019-05-09 DIAGNOSIS — Z79899 Other long term (current) drug therapy: Secondary | ICD-10-CM | POA: Diagnosis not present

## 2019-05-09 DIAGNOSIS — C099 Malignant neoplasm of tonsil, unspecified: Secondary | ICD-10-CM

## 2019-05-09 DIAGNOSIS — Z510 Encounter for antineoplastic radiation therapy: Secondary | ICD-10-CM | POA: Diagnosis not present

## 2019-05-09 DIAGNOSIS — C799 Secondary malignant neoplasm of unspecified site: Secondary | ICD-10-CM

## 2019-05-09 DIAGNOSIS — IMO0002 Reserved for concepts with insufficient information to code with codable children: Secondary | ICD-10-CM

## 2019-05-09 DIAGNOSIS — C090 Malignant neoplasm of tonsillar fossa: Secondary | ICD-10-CM | POA: Diagnosis not present

## 2019-05-09 DIAGNOSIS — Z5111 Encounter for antineoplastic chemotherapy: Secondary | ICD-10-CM | POA: Diagnosis not present

## 2019-05-09 DIAGNOSIS — I739 Peripheral vascular disease, unspecified: Secondary | ICD-10-CM | POA: Diagnosis not present

## 2019-05-09 DIAGNOSIS — Z8249 Family history of ischemic heart disease and other diseases of the circulatory system: Secondary | ICD-10-CM | POA: Diagnosis not present

## 2019-05-09 DIAGNOSIS — Z800 Family history of malignant neoplasm of digestive organs: Secondary | ICD-10-CM | POA: Diagnosis not present

## 2019-05-09 DIAGNOSIS — E785 Hyperlipidemia, unspecified: Secondary | ICD-10-CM | POA: Diagnosis not present

## 2019-05-09 DIAGNOSIS — C770 Secondary and unspecified malignant neoplasm of lymph nodes of head, face and neck: Secondary | ICD-10-CM | POA: Diagnosis not present

## 2019-05-09 MED ORDER — HEPARIN SOD (PORK) LOCK FLUSH 100 UNIT/ML IV SOLN
250.0000 [IU] | Freq: Once | INTRAVENOUS | Status: AC | PRN
  Administered 2019-05-09: 500 [IU]

## 2019-05-09 MED ORDER — SODIUM CHLORIDE 0.9% FLUSH
10.0000 mL | Freq: Once | INTRAVENOUS | Status: AC | PRN
  Administered 2019-05-09: 10 mL

## 2019-05-09 MED ORDER — SODIUM CHLORIDE 0.9 % IV SOLN
Freq: Once | INTRAVENOUS | Status: AC
  Filled 2019-05-09: qty 1000

## 2019-05-09 NOTE — Patient Instructions (Signed)
[place] at [facility]  Discharge Instructions:  IV fluids received today. Follow up as previously scheduled. _______________________________________________________________  Thank you for choosing [place] at [facility] to provide your oncology and hematology care.  To afford each patient quality time with our providers, please arrive at least 15 minutes before your scheduled appointment.  You need to re-schedule your appointment if you arrive 10 or more minutes late.  We strive to give you quality time with our providers, and arriving late affects you and other patients whose appointments are after yours.  Also, if you no show three or more times for appointments you may be dismissed from the clinic.  Again, thank you for choosing [place] at [facility] hope is that these requests will allow you access to exceptional care and in a timely manner. _______________________________________________________________  If you have questions after your visit, please contact our office at [phone] between the hours of 8:30 a.m. and 5:00 p.m. Voicemails left after 4:30 p.m. will not be returned until the following business day. _______________________________________________________________  For prescription refill requests, have your pharmacy contact our office. _______________________________________________________________  Recommendations made by the consultant and any test results will be sent to your referring physician. _______________________________________________________________

## 2019-05-09 NOTE — Progress Notes (Signed)
[place] Initial Psychosocial Assessment Clinical Social Work  Clinical Social Work contacted by phone to assess psychosocial, emotional, mental health, and spiritual needs of the patient.   Barriers to care/review of distress screen:  - Transportation:  Do you anticipate any problems getting to appointments?  Do you have someone who can help run errands for you if you need it?  Can drive himself, no problem.  Wife can also drive. - Help at home:  What is your living situation (alone, family, other)?  If you are physically unable to care for yourself, who would you call on to help you?  Lives w wife and 104 year old father, they are caregivers for father.  Father can get around w walker, otherwise uses wheelchair.  Patient and wife provide hands on care to father, administer medicines, feed father.  "It wasn't too bad until this cancer, now I have all these appointments." - Support system:  What does your support system look like?  Who would you call on if you needed some kind of practical help?  What if you needed someone to talk to for emotional support?  [facility] assists w father.  Has daughter and son in [place] who could.  Has large group of friends, former coworkers.   - Finances:  Are you concerned about finances.  Considering returning to work?  If not, applying for disability?  Retired early at age 60.  Had prostate cancer in 2014 which led to early retirement.  "We hold our own in terms of finances, wife on disability and patient is retired."  Has unexpected expenses/copays and similar related to cancer diagnosis and treatment.  These are unbudgeted expenses.    What is your understanding of where you are with your cancer? Its cause?  Your treatment plan and what happens next?  Had prostate cancer in 2014 -had two months of radiation and had hormonal chemotherapy afterwards.  "It only put the cancer in remission."  Newly diagnosed w head/neck cancer.  "Was a real shock - it came up all  of a sudden."  This diagnosis was a "little easier to accept" as he had already been through process before w prostate cancer.  Had lump in neck eventually biopsied and was then diagnosed w cancer. Had tonsils removed, PET scan showed cancer in tonsil and gland.  Had first chemo tx yesterday, "it wasn't too bad, a little nausea but I took medicines like they told me to."  Aware that side effects may increase over time.  Has been battling fatigue for over a month - "seems like that's all I do - go to doctors, surgeries, a lot of stress."  Stress levels rated at 7/10.  Reads Bible, talks to people and have quiet time - strong faith resources.  So far has had no trouble w eating, is following prescribed diet.  Aware that it may be more difficult when he is in radiation.  Wants to avoid feeding tube placement.  "I fought a hard battle in 2014 - I am going to beat this one too.  I have a really strong will, I don't let things get to me."    What are your worries for the future as you begin treatment for cancer? Caring for father - has to ask wife to shoulder more of the load as a result of his treatments.    What are your hopes and priorities during your treatment? What is important to you? What are your goals for your care?  Likes to be outdoors - aware that he is looking at total of 4 months of treatment/recovery.  Wants to see grandchildren often.  "This really hasn't changed my life too much - I am just spending lots of time in hospitals, treatments, scans."  This limits the time he is able to assist w care of his father at home.     CSW Summary:  Patient and family psychosocial functioning including strengths, limitations, and coping skills:  69 year old male newly diagnosed w tonsillar cancer, will have chemotherapy and radiation over next several months.  Past history of prostate cancer, patient describes this as a 'hard battle.'  The new diagnosis was somewhat easier to handle as he had experience with  cancer treatments in the past.  Lives w wife and his father.  Father is 52, requires hands on care from patient and his wife - patient is now less able to be available to help w this care as his days are more consumed with appointments related to cancer treatments.  Has a strong faith which he uses to manage the stressors in his life.  Has good support from wife, family and church friends.  Retired, on a fixed income.  Copays for multiple treatments have been a significant stressor, he is aware that his policy has an out of pocket maximum but does not know what it is.  Has had one treatment, few side effects. Is aware that this will not always be the case - has been told that as treatments progress, side effects often increase.  Wants to avoid having a feeding tube, so he is working hard on nutrition and maintaining his weight.    Identifications of barriers to care:  None noted.    Availability of community resources:  Explained availability of Patient and Family Support Services, [place].    Clinical Social Worker follow up needed: No.  Patient aware that he can request CSW as needed.  Edwyna Shell, LCSW Clinical Social Worker Phone:  [phone] Cell:  [phone]

## 2019-05-09 NOTE — Progress Notes (Signed)
24 POST chemotherapy follow up: Pt here today for IV hydration after receiving Cisplatin yesterday. Pt reports he had a good night. He states he had one episode of nausea and he took his Compazine which helped. He has been eating and drinking without issues. Proceeding with IV hydration as planned.  Cory Foley tolerated IV hydration without incident or complaint. VSS. Discharged in satisfactory condition with follow up instructions.

## 2019-05-10 DIAGNOSIS — Z510 Encounter for antineoplastic radiation therapy: Secondary | ICD-10-CM | POA: Diagnosis not present

## 2019-05-10 DIAGNOSIS — C090 Malignant neoplasm of tonsillar fossa: Secondary | ICD-10-CM | POA: Diagnosis not present

## 2019-05-11 DIAGNOSIS — C090 Malignant neoplasm of tonsillar fossa: Secondary | ICD-10-CM | POA: Diagnosis not present

## 2019-05-11 DIAGNOSIS — Z510 Encounter for antineoplastic radiation therapy: Secondary | ICD-10-CM | POA: Diagnosis not present

## 2019-05-11 NOTE — Progress Notes (Signed)

## 2019-05-12 DIAGNOSIS — C090 Malignant neoplasm of tonsillar fossa: Secondary | ICD-10-CM | POA: Diagnosis not present

## 2019-05-12 DIAGNOSIS — Z510 Encounter for antineoplastic radiation therapy: Secondary | ICD-10-CM | POA: Diagnosis not present

## 2019-05-15 ENCOUNTER — Inpatient Hospital Stay (HOSPITAL_COMMUNITY): Payer: Medicare Other

## 2019-05-15 ENCOUNTER — Inpatient Hospital Stay (HOSPITAL_COMMUNITY): Payer: Medicare Other | Admitting: Hematology

## 2019-05-15 ENCOUNTER — Other Ambulatory Visit: Payer: Self-pay

## 2019-05-15 ENCOUNTER — Encounter (HOSPITAL_COMMUNITY): Payer: Self-pay | Admitting: Hematology

## 2019-05-15 VITALS — BP 110/62 | HR 62 | Temp 97.7°F | Resp 18

## 2019-05-15 DIAGNOSIS — Z8249 Family history of ischemic heart disease and other diseases of the circulatory system: Secondary | ICD-10-CM | POA: Diagnosis not present

## 2019-05-15 DIAGNOSIS — Z95828 Presence of other vascular implants and grafts: Secondary | ICD-10-CM

## 2019-05-15 DIAGNOSIS — C099 Malignant neoplasm of tonsil, unspecified: Secondary | ICD-10-CM

## 2019-05-15 DIAGNOSIS — Z79899 Other long term (current) drug therapy: Secondary | ICD-10-CM | POA: Diagnosis not present

## 2019-05-15 DIAGNOSIS — Z801 Family history of malignant neoplasm of trachea, bronchus and lung: Secondary | ICD-10-CM | POA: Diagnosis not present

## 2019-05-15 DIAGNOSIS — G2581 Restless legs syndrome: Secondary | ICD-10-CM | POA: Diagnosis not present

## 2019-05-15 DIAGNOSIS — Z510 Encounter for antineoplastic radiation therapy: Secondary | ICD-10-CM | POA: Diagnosis not present

## 2019-05-15 DIAGNOSIS — Z800 Family history of malignant neoplasm of digestive organs: Secondary | ICD-10-CM | POA: Diagnosis not present

## 2019-05-15 DIAGNOSIS — I739 Peripheral vascular disease, unspecified: Secondary | ICD-10-CM | POA: Diagnosis not present

## 2019-05-15 DIAGNOSIS — Z923 Personal history of irradiation: Secondary | ICD-10-CM | POA: Diagnosis not present

## 2019-05-15 DIAGNOSIS — C090 Malignant neoplasm of tonsillar fossa: Secondary | ICD-10-CM | POA: Diagnosis not present

## 2019-05-15 DIAGNOSIS — Z833 Family history of diabetes mellitus: Secondary | ICD-10-CM | POA: Diagnosis not present

## 2019-05-15 DIAGNOSIS — C770 Secondary and unspecified malignant neoplasm of lymph nodes of head, face and neck: Secondary | ICD-10-CM | POA: Diagnosis not present

## 2019-05-15 DIAGNOSIS — J449 Chronic obstructive pulmonary disease, unspecified: Secondary | ICD-10-CM | POA: Diagnosis not present

## 2019-05-15 DIAGNOSIS — E785 Hyperlipidemia, unspecified: Secondary | ICD-10-CM | POA: Diagnosis not present

## 2019-05-15 DIAGNOSIS — Z5111 Encounter for antineoplastic chemotherapy: Secondary | ICD-10-CM | POA: Diagnosis not present

## 2019-05-15 LAB — CBC WITH DIFFERENTIAL/PLATELET
Abs Immature Granulocytes: 0.03 10*3/uL (ref 0.00–0.07)
Basophils Absolute: 0 10*3/uL (ref 0.0–0.1)
Basophils Relative: 0 %
Eosinophils Absolute: 0.1 10*3/uL (ref 0.0–0.5)
Eosinophils Relative: 1 %
HCT: 39.7 % (ref 39.0–52.0)
Hemoglobin: 12.7 g/dL — ABNORMAL LOW (ref 13.0–17.0)
Immature Granulocytes: 1 %
Lymphocytes Relative: 16 %
Lymphs Abs: 1 10*3/uL (ref 0.7–4.0)
MCH: 32.2 pg (ref 26.0–34.0)
MCHC: 32 g/dL (ref 30.0–36.0)
MCV: 100.8 fL — ABNORMAL HIGH (ref 80.0–100.0)
Monocytes Absolute: 0.5 10*3/uL (ref 0.1–1.0)
Monocytes Relative: 8 %
Neutro Abs: 4.3 10*3/uL (ref 1.7–7.7)
Neutrophils Relative %: 74 %
Platelets: 197 10*3/uL (ref 150–400)
RBC: 3.94 MIL/uL — ABNORMAL LOW (ref 4.22–5.81)
RDW: 13.7 % (ref 11.5–15.5)
WBC: 5.9 10*3/uL (ref 4.0–10.5)
nRBC: 0 % (ref 0.0–0.2)

## 2019-05-15 LAB — COMPREHENSIVE METABOLIC PANEL
ALT: 13 U/L (ref 0–44)
AST: 16 U/L (ref 15–41)
Albumin: 3.9 g/dL (ref 3.5–5.0)
Alkaline Phosphatase: 57 U/L (ref 38–126)
Anion gap: 8 (ref 5–15)
BUN: 14 mg/dL (ref 8–23)
CO2: 24 mmol/L (ref 22–32)
Calcium: 9.2 mg/dL (ref 8.9–10.3)
Chloride: 106 mmol/L (ref 98–111)
Creatinine, Ser: 0.88 mg/dL (ref 0.61–1.24)
GFR calc Af Amer: >60 mL/min (ref >60)
GFR calc non Af Amer: >60 mL/min (ref >60)
Glucose, Bld: 101 mg/dL — ABNORMAL HIGH (ref 70–99)
Potassium: 4 mmol/L (ref 3.5–5.1)
Sodium: 138 mmol/L (ref 135–145)
Total Bilirubin: 0.5 mg/dL (ref 0.3–1.2)
Total Protein: 6.9 g/dL (ref 6.5–8.1)

## 2019-05-15 LAB — MAGNESIUM: Magnesium: 1.9 mg/dL (ref 1.7–2.4)

## 2019-05-15 MED ORDER — POTASSIUM CHLORIDE 2 MEQ/ML IV SOLN
Freq: Once | INTRAVENOUS | Status: AC
  Filled 2019-05-15: qty 10

## 2019-05-15 MED ORDER — SODIUM CHLORIDE 0.9 % IV SOLN: Freq: Once | INTRAVENOUS | Status: AC

## 2019-05-15 MED ORDER — SODIUM CHLORIDE 0.9 % IV SOLN
150.0000 mg | Freq: Once | INTRAVENOUS | Status: AC
  Administered 2019-05-15: 150 mg via INTRAVENOUS
  Filled 2019-05-15: qty 150

## 2019-05-15 MED ORDER — HEPARIN SOD (PORK) LOCK FLUSH 100 UNIT/ML IV SOLN
500.0000 [IU] | Freq: Once | INTRAVENOUS | Status: AC | PRN
  Administered 2019-05-15: 500 [IU]

## 2019-05-15 MED ORDER — SODIUM CHLORIDE 0.9 % IV SOLN
40.0000 mg/m2 | Freq: Once | INTRAVENOUS | Status: AC
  Administered 2019-05-15: 77 mg via INTRAVENOUS
  Filled 2019-05-15: qty 77

## 2019-05-15 MED ORDER — PALONOSETRON HCL INJECTION 0.25 MG/5ML
0.2500 mg | Freq: Once | INTRAVENOUS | Status: AC
  Administered 2019-05-15: 0.25 mg via INTRAVENOUS
  Filled 2019-05-15: qty 5

## 2019-05-15 MED ORDER — SODIUM CHLORIDE 0.9 % IV SOLN
10.0000 mg | Freq: Once | INTRAVENOUS | Status: AC
  Administered 2019-05-15: 10 mg via INTRAVENOUS
  Filled 2019-05-15: qty 10

## 2019-05-15 MED ORDER — SODIUM CHLORIDE 0.9% FLUSH
10.0000 mL | INTRAVENOUS | Status: DC | PRN
  Administered 2019-05-15: 10 mL

## 2019-05-15 NOTE — Patient Instructions (Signed)
[place] at [facility] Discharge Instructions  You were seen today by Dr. Delton Coombes. He went over your recent lab results and how you've felt since your first treatment. He will see you back in 1 week for labs, treatment and follow up.   Thank you for choosing [place] at [facility] to provide your oncology and hematology care.  To afford each patient quality time with our provider, please arrive at least 15 minutes before your scheduled appointment time.   If you have a lab appointment with the [facility] please come in thru the  Main Entrance and check in at the main information desk  You need to re-schedule your appointment should you arrive 10 or more minutes late.  We strive to give you quality time with our providers, and arriving late affects you and other patients whose appointments are after yours.  Also, if you no show three or more times for appointments you may be dismissed from the clinic at the providers discretion.     Again, thank you for choosing [facility].  Our hope is that these requests will decrease the amount of time that you wait before being seen by our physicians.       _____________________________________________________________  Should you have questions after your visit to [facility], please contact our office at [phone] between the hours of 8:00 a.m. and 4:30 p.m.  Voicemails left after 4:00 p.m. will not be returned until the following business day.  For prescription refill requests, have your pharmacy contact our office and allow 72 hours.    Cancer Center Support Programs:   > Cancer Support Group  2nd Tuesday of the month 1pm-2pm, Journey Room

## 2019-05-15 NOTE — Progress Notes (Signed)
Patient has been assessed, vital signs and labs have been reviewed by Dr. Katragadda. ANC, Creatinine, LFTs, and Platelets are within treatment parameters per Dr. Katragadda. The patient is good to proceed with treatment at this time.  

## 2019-05-15 NOTE — Patient Instructions (Signed)
[facility] Discharge Instructions for Patients Receiving Chemotherapy  Today you received the following chemotherapy agents   To help prevent nausea and vomiting after your treatment, we encourage you to take your nausea medication   If you develop nausea and vomiting that is not controlled by your nausea medication, call the clinic.   BELOW ARE SYMPTOMS THAT SHOULD BE REPORTED IMMEDIATELY:  *FEVER GREATER THAN 100.5 F  *CHILLS WITH OR WITHOUT FEVER  NAUSEA AND VOMITING THAT IS NOT CONTROLLED WITH YOUR NAUSEA MEDICATION  *UNUSUAL SHORTNESS OF BREATH  *UNUSUAL BRUISING OR BLEEDING  TENDERNESS IN MOUTH AND THROAT WITH OR WITHOUT PRESENCE OF ULCERS  *URINARY PROBLEMS  *BOWEL PROBLEMS  UNUSUAL RASH Items with * indicate a potential emergency and should be followed up as soon as possible.  Feel free to call the clinic should you have any questions or concerns. The clinic phone number is [phone].  Please show the CHEMO ALERT CARD at check-in to the Emergency Department and triage nurse.   

## 2019-05-15 NOTE — Progress Notes (Signed)
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  CURRENT THERAPY: Chemoradiation therapy.  BRIEF ONCOLOGIC HISTORY:  Oncology History  Tonsillar cancer ([place])  04/07/2019 Initial Diagnosis   Tonsillar cancer ([place])   05/08/2019 -  Chemotherapy   The patient had palonosetron (ALOXI) injection 0.25 mg, 0.25 mg, Intravenous,  Once, 2 of 7 cycles Administration: 0.25 mg (05/08/2019), 0.25 mg (05/15/2019) CISplatin (PLATINOL) 77 mg in sodium chloride 0.9 % 250 mL chemo infusion, 40 mg/m2 = 77 mg, Intravenous,  Once, 2 of 7 cycles Administration: 77 mg (05/08/2019), 77 mg (05/15/2019) fosaprepitant (EMEND) 150 mg in sodium chloride 0.9 % 145 mL IVPB, 150 mg, Intravenous,  Once, 2 of 7 cycles Administration: 150 mg (05/08/2019), 150 mg (05/15/2019)  for chemotherapy treatment.       CANCER STAGING: Cancer Staging Tonsillar cancer ([place]) Staging form: Pharynx - HPV-Mediated Oropharynx, AJCC 8th Edition - Clinical stage from 04/07/2019: Stage I (cT2, cN1, cM0, p16+) - Unsigned    INTERVAL HISTORY:  Mr. Dumas 69 y.o. male seen for follow-up of left tonsillar cancer.  He is here for toxicity assessment prior to week 2 of chemotherapy.  He completed radiation today.  He reported loss of taste since the first cycle of treatment.  He had nausea for 1 day and denied any vomiting.  Denied any tingling or numbness in extremities.  Denied any ringing in the ears or hearing loss.  Appetite is 50%.  Energy levels are 25%.    REVIEW OF SYSTEMS:  Review of Systems  All other systems reviewed and are negative.    PAST MEDICAL/SURGICAL HISTORY:  Past Medical History:  Diagnosis Date  . Allergy   . Anxiety   . Arthritis   . BPH with obstruction/lower urinary tract symptoms   .  COPD, mild ([place])    current smoker  . ED (erectile dysfunction)   . GERD (gastroesophageal reflux disease)   . Hyperlipidemia   . Nocturia   . Osteoporosis   . PAD (peripheral artery disease) ([place])   . Port-A-Cath in place 04/28/2019  . Prostate cancer ([place]) 01/11/2012   Adenocarcinoma, Gleason=4+4=8, & 4+5=9,PSA=17.24, Volume= 15.45cc  . PVD (peripheral vascular disease) ([place])   . Restless leg syndrome   . Tongue mass    SCC   Past Surgical History:  Procedure Laterality Date  . ABDOMINAL AORTOGRAM W/LOWER EXTREMITY N/A 08/16/2018   Procedure: ABDOMINAL AORTOGRAM W/LOWER EXTREMITY;  Surgeon: Serafina Mitchell, MD;  Location: [facility];  Service: Cardiovascular;  Laterality: N/A;  Bilateral  . COLONOSCOPY N/A 09/28/2012   Procedure: COLONOSCOPY;  Surgeon: Danie Binder, MD;  [facility];  Service: Endoscopy;  Laterality: N/A;  8:30  . DIRECT LARYNGOSCOPY N/A 04/03/2019   Procedure: DIRECT LARYNGOSCOPY;  Surgeon: Leta Baptist, MD;  Location: [facility];  Service: ENT;  Laterality: N/A;  . ESOPHAGOGASTRODUODENOSCOPY N/A 09/28/2012   Procedure: ESOPHAGOGASTRODUODENOSCOPY (EGD);  Surgeon: Danie Binder, MD;  [facility];  Service: Endoscopy;  Laterality: N/A;  . Gold seed implatation  04/28/2012  . HERNIA REPAIR     Bilateral inguinal X2  . IR IMAGING GUIDED PORT INSERTION  05/03/2019  . JOINT REPLACEMENT     bilateral  . KNEE SURGERY     Left  Knee X 2   and Right knee X1  . MALONEY DILATION N/A 09/28/2012   Procedure: MALONEY DILATION;  Surgeon: Danie Binder, MD;  [facility];  Service: Endoscopy;  Laterality: N/A;  . NOSE SURGERY    . PERIPHERAL VASCULAR INTERVENTION  08/16/2018   Procedure: PERIPHERAL VASCULAR INTERVENTION;  Surgeon: Serafina Mitchell, MD;  Location: [facility];  Service: Cardiovascular;;  LT Iliac  . PR VEIN BYPASS GRAFT,AORTO-FEM-POP  10/21/10   Left AK to BK popliteal BPG  . PROSTATE BIOPSY  01/11/2012    Adenocarcinoma  . PROSTATE SURGERY  2015   Chemo and  Radiation  . SAVORY DILATION N/A 09/28/2012   Procedure: SAVORY DILATION;  Surgeon: Danie Binder, MD;  [facility];  Service: Endoscopy;  Laterality: N/A;  . throat biopsy  03/20/2019  . TONSILLECTOMY Left 04/03/2019   Procedure: TONSILLECTOMY;  Surgeon: Leta Baptist, MD;  Location: [facility];  Service: ENT;  Laterality: Left;     SOCIAL HISTORY:  Social History   Socioeconomic History  . Marital status: Married    Spouse name: Holley Raring  . Number of children: 2  . Years of education: College  . 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 Smoking status: Current Every Day Smoker    Packs/day: 0.50    Years: 43.00    Pack years: 21.50    Types: Cigarettes  . Smokeless tobacco: Never Used  . Tobacco comment: 1/2 ppd  Substance and Sexual Activity  . Alcohol use: No    Comment: quit 1.5 years ago  . Drug use: No  . Sexual activity: Not Currently  Other Topics Concern  . Not on file  Social History Narrative   Patient lives at home with spouse.   Caffeine use: very little   Social Determinants of Health   Financial Resource Strain: Low Risk   . Difficulty of Paying Living Expenses: Not hard at all  Food Insecurity: No Food Insecurity  . Worried About Charity fundraiser in the Last Year: Never true  . Ran Out of Food in the Last Year: Never true  Transportation Needs: No Transportation Needs  . Lack of Transportation (Medical): No  . Lack of Transportation (Non-Medical): No  Physical Activity: Sufficiently Active  . Days of Exercise per Week: 5 days  . Minutes of Exercise per Session: 30 min  Stress: Stress Concern Present  . Feeling of Stress : Rather much  Social Connections: Slightly Isolated  . Frequency of Communication with Friends and Family: Twice a week    . Frequency of Social Gatherings with Friends and Family: Twice a week  . Attends Religious Services: More than 4 times per year  . Active Member of Clubs or Organizations: No  . Attends Archivist Meetings: Never  . Marital Status: Married  Human resources officer Violence: Not At Risk  . Fear of Current or Ex-Partner: No  . Emotionally Abused: No  . Physically Abused: No  . Sexually Abused: No    FAMILY HISTORY:  Family History  Problem Relation Age of Onset  . Heart disease Mother        Valve regurgitation and Pacemaker   . Diabetes Mother   . Hyperlipidemia Mother   . Heart disease Father  CABG x 5  . Hyperlipidemia Father   . Hypertension Father   . Heart attack Father   . Heart disease Sister        aortic valve replacement  . Cancer Sister 90       Colon cancer w/ metastasis  . Throat cancer Sister   . Hypertension Brother   . Heart disease Maternal Grandmother   . Heart disease Maternal Grandfather   . Heart disease Paternal Grandmother   . Cancer Paternal Grandfather   . Healthy Son   . Healthy Daughter     CURRENT MEDICATIONS:  Outpatient Encounter Medications as of 05/15/2019  Medication Sig Note  . aspirin EC 81 MG tablet Take 81 mg by mouth daily.   . calcium carbonate (OS-CAL) 600 MG tablet Take 1 tablet (600 mg total) by mouth 2 (two) times daily with a meal. (Patient taking differently: Take 600 mg by mouth 2 (two) times daily. )   . Cholecalciferol (VITAMIN D) 50 MCG (2000 UT) tablet Take 2,000 Units by mouth in the morning and at bedtime.   Marland Kitchen CISPLATIN IV Inject into the vein once a week.   . clopidogrel (PLAVIX) 75 MG tablet Take 75 mg by mouth daily.    . famotidine (PEPCID) 20 MG tablet TAKE 1 TABLET BY MOUTH AT BEDTIME. (Patient taking differently: Take 20 mg by mouth at bedtime. )   . mirtazapine (REMERON) 15 MG tablet Take 1 tablet (15 mg total) by mouth at bedtime.   . pantoprazole (PROTONIX) 40 MG tablet TAKE ONE TABLET BY MOUTH  DAILY. (Patient taking differently: Take 40 mg by mouth daily. )   . rOPINIRole (REQUIP) 3 MG tablet TAKE (1) TABLET BY MOUTH AT BEDTIME. (Patient taking differently: Take 3 mg by mouth at bedtime. )   . simvastatin (ZOCOR) 40 MG tablet TAKE (1) TABLET BY MOUTH AT BEDTIME. (Patient taking differently: Take 40 mg by mouth at bedtime. )   . topiramate (TOPAMAX) 25 MG tablet TAKE 3 TABLETS BY MOUTH DAILY. (Patient taking differently: Take 50-75 mg by mouth at bedtime. ) 04/27/2019: If pt is having frequent migraines he'll take 75 mg at night, if not he'll take 50 mg at night  . cyclobenzaprine (FLEXERIL) 5 MG tablet Take 5 mg by mouth at bedtime as needed for muscle spasms.    Marland Kitchen HYDROcodone-acetaminophen (NORCO) 10-325 MG tablet Take 1 tablet by mouth every 4 (four) hours as needed. Chronic Pain. Dx: G89.4 (Patient not taking: Reported on 05/08/2019)   . lidocaine-prilocaine (EMLA) cream Apply a pea-sized amount to port a cath site and cover with plastic wrap 1 hour prior to appointment (Patient not taking: Reported on 05/08/2019)   . methocarbamol (ROBAXIN) 500 MG tablet Take 500 mg by mouth every 6 (six) hours as needed for muscle spasms.    . prochlorperazine (COMPAZINE) 10 MG tablet Take 1 tablet (10 mg total) by mouth every 6 (six) hours as needed (Nausea or vomiting). (Patient not taking: Reported on 05/08/2019)    No facility-administered encounter medications on file as of 05/15/2019.    ALLERGIES:  Allergies  Allergen Reactions  . Codeine Other (See Comments)    Bad headache and sweats  . Morphine And Related Itching    sweats  . Percocet [Oxycodone-Acetaminophen] Other (See Comments)    Headache and sweats     PHYSICAL EXAM:  ECOG Performance status: 1  Vitals:   05/15/19 0832  BP: 125/71  Pulse: 66  Resp: 19  Temp: (!) 96.9  F (36.1 C)  SpO2: 99%   Filed Weights   05/15/19 0832  Weight: 170 lb 3.2 oz (77.2 kg)    Physical Exam Vitals reviewed.  Constitutional:       Appearance: Normal appearance.  HENT:     Mouth/Throat:     Mouth: Mucous membranes are moist.  Cardiovascular:     Rate and Rhythm: Normal rate and regular rhythm.     Heart sounds: Normal heart sounds.  Pulmonary:     Effort: Pulmonary effort is normal.     Breath sounds: Normal breath sounds.  Abdominal:     General: There is no distension.     Palpations: Abdomen is soft.  Lymphadenopathy:     Cervical: Cervical adenopathy present.  Skin:    General: Skin is warm.  Neurological:     General: No focal deficit present.     Mental Status: He is alert and oriented to person, place, and time.  Psychiatric:        Mood and Affect: Mood normal.        Behavior: Behavior normal.      LABORATORY DATA:  I have reviewed the labs as listed.  CBC    Component Value Date/Time   WBC 5.9 05/15/2019 0827   RBC 3.94 (L) 05/15/2019 0827   HGB 12.7 (L) 05/15/2019 0827   HCT 39.7 05/15/2019 0827   PLT 197 05/15/2019 0827   MCV 100.8 (H) 05/15/2019 0827   MCH 32.2 05/15/2019 0827   MCHC 32.0 05/15/2019 0827   RDW 13.7 05/15/2019 0827   LYMPHSABS 1.0 05/15/2019 0827   MONOABS 0.5 05/15/2019 0827   EOSABS 0.1 05/15/2019 0827   BASOSABS 0.0 05/15/2019 0827   CMP Latest Ref Rng & Units 05/15/2019 05/08/2019 02/20/2019  Glucose 70 - 99 mg/dL 101(H) 94 72  BUN 8 - 23 mg/dL 14 8 11   Creatinine 0.61 - 1.24 mg/dL 0.88 0.83 0.97  Sodium 135 - 145 mmol/L 138 141 141  Potassium 3.5 - 5.1 mmol/L 4.0 4.1 4.1  Chloride 98 - 111 mmol/L 106 111 108  CO2 22 - 32 mmol/L 24 23 24   Calcium 8.9 - 10.3 mg/dL 9.2 8.9 9.2  Total Protein 6.5 - 8.1 g/dL 6.9 6.6 6.3  Total Bilirubin 0.3 - 1.2 mg/dL 0.5 0.4 0.3  Alkaline Phos 38 - 126 U/L 57 57 -  AST 15 - 41 U/L 16 15 13   ALT 0 - 44 U/L 13 12 8(L)       DIAGNOSTIC IMAGING:  I have reviewed scans.    ASSESSMENT & PLAN:   Tonsillar cancer ([place]) 1.  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  No other metastatic disease. -Chemoradiation therapy with weekly cisplatin started on 05/08/2019. -He had nausea for 1 day and denied any vomiting.  He lost taste to food. -We reviewed his CBC which is within normal limits.  LFTs are normal.  He will proceed with week 2 treatment today.  We will reevaluate him in 1 week.  2.  History of prostate cancer: -Gleason 9 prostate cancer, diagnosed in November 2013, status post XRT from April 2014 through May 2014 with 2 years of Lupron.  He is having periodic PSAs checked and is continuing to be in remission.      Orders placed this encounter:  No orders of the defined types  were placed in this encounter.     Derek Jack, MD [place] [phone]

## 2019-05-15 NOTE — Progress Notes (Signed)
Labs reviewed today with MD. Will proceed with treatment today per MD.   Treatment given per orders. Patient tolerated it well without problems. Vitals stable and discharged home from clinic ambulatory. Follow up as scheduled.  

## 2019-05-15 NOTE — Assessment & Plan Note (Signed)
1.  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  No other metastatic disease. -Chemoradiation therapy with weekly cisplatin started on 05/08/2019. -He had nausea for 1 day and denied any vomiting.  He lost taste to food. -We reviewed his CBC which is within normal limits.  LFTs are normal.  He will proceed with week 2 treatment today.  We will reevaluate him in 1 week.  2.  History of prostate cancer: -Gleason 9 prostate cancer, diagnosed in November 2013, status post XRT from April 2014 through May 2014 with 2 years of Lupron.  He is having periodic PSAs checked and is continuing to be in remission.

## 2019-05-16 DIAGNOSIS — C090 Malignant neoplasm of tonsillar fossa: Secondary | ICD-10-CM | POA: Diagnosis not present

## 2019-05-16 DIAGNOSIS — Z510 Encounter for antineoplastic radiation therapy: Secondary | ICD-10-CM | POA: Diagnosis not present

## 2019-05-17 ENCOUNTER — Telehealth (HOSPITAL_COMMUNITY): Payer: Self-pay | Admitting: Speech Pathology

## 2019-05-17 DIAGNOSIS — C090 Malignant neoplasm of tonsillar fossa: Secondary | ICD-10-CM | POA: Diagnosis not present

## 2019-05-17 DIAGNOSIS — Z510 Encounter for antineoplastic radiation therapy: Secondary | ICD-10-CM | POA: Diagnosis not present

## 2019-05-17 NOTE — Telephone Encounter (Signed)
the pt called to cancel the appt for tomorrow he stated that he will not be rescheduling due to the fact that he feels that he does not need speech at this time and if he should need [place] he will all his doctor's office back.

## 2019-05-18 ENCOUNTER — Ambulatory Visit (HOSPITAL_COMMUNITY): Payer: Medicare Other | Admitting: Speech Pathology

## 2019-05-18 DIAGNOSIS — Z510 Encounter for antineoplastic radiation therapy: Secondary | ICD-10-CM | POA: Diagnosis not present

## 2019-05-18 DIAGNOSIS — C090 Malignant neoplasm of tonsillar fossa: Secondary | ICD-10-CM | POA: Diagnosis not present

## 2019-05-19 ENCOUNTER — Ambulatory Visit (HOSPITAL_COMMUNITY): Payer: Medicare Other

## 2019-05-19 DIAGNOSIS — Z510 Encounter for antineoplastic radiation therapy: Secondary | ICD-10-CM | POA: Diagnosis not present

## 2019-05-19 DIAGNOSIS — C090 Malignant neoplasm of tonsillar fossa: Secondary | ICD-10-CM | POA: Diagnosis not present

## 2019-05-19 NOTE — Progress Notes (Signed)
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  Patient receiving concurrent chemotherapy and radiation therapy.    Called for nutrition follow-up and spoke with wife.  Reports patient not able to talk at this time.  States that patient is having a hard time swallowing and no taste.  Wife asking for help with ensure/boost products due to expense.  Reports that he is able to tolerate those shakes well.  Eating soft foods.  Still able to eat peanut butter and jelly sandwiches.  Wife states that patient does not want a feeding tube.      Medications: reviewed  Labs: glucose 101  Anthropometrics:   Weight 170 lb noted on 3/22.    172 lb on 3/10 165 lb on 12/26/2018 162 lb on 09/19/2018    NUTRITION DIAGNOSIS: Predicted suboptimal energy intake continues     INTERVENTION:  Discussed with wife ways to prepare softer foods.  Encouraged adding liquid (gravies, sauces, etc).   Will provide complimentary case of ensure enlive prefers chocolate but agreeable to vanilla.   Patient has contact information    MONITORING, EVALUATION, GOAL: Patient will consume adequate calories and protein to prevent weight loss during treatment   NEXT VISIT: April 16th phone f/u  [name], [place], [place] Registered Dietitian [phone] (pager)

## 2019-05-22 ENCOUNTER — Inpatient Hospital Stay (HOSPITAL_COMMUNITY): Payer: Medicare Other

## 2019-05-22 ENCOUNTER — Other Ambulatory Visit: Payer: Self-pay

## 2019-05-22 ENCOUNTER — Encounter (HOSPITAL_COMMUNITY): Payer: Self-pay | Admitting: Hematology

## 2019-05-22 ENCOUNTER — Other Ambulatory Visit (HOSPITAL_COMMUNITY): Payer: Self-pay | Admitting: *Deleted

## 2019-05-22 ENCOUNTER — Inpatient Hospital Stay (HOSPITAL_COMMUNITY): Payer: Medicare Other | Admitting: Hematology

## 2019-05-22 VITALS — BP 116/66 | HR 59 | Temp 97.5°F | Resp 18

## 2019-05-22 DIAGNOSIS — C099 Malignant neoplasm of tonsil, unspecified: Secondary | ICD-10-CM

## 2019-05-22 DIAGNOSIS — Z800 Family history of malignant neoplasm of digestive organs: Secondary | ICD-10-CM | POA: Diagnosis not present

## 2019-05-22 DIAGNOSIS — Z95828 Presence of other vascular implants and grafts: Secondary | ICD-10-CM

## 2019-05-22 DIAGNOSIS — Z833 Family history of diabetes mellitus: Secondary | ICD-10-CM | POA: Diagnosis not present

## 2019-05-22 DIAGNOSIS — Z923 Personal history of irradiation: Secondary | ICD-10-CM | POA: Diagnosis not present

## 2019-05-22 DIAGNOSIS — E785 Hyperlipidemia, unspecified: Secondary | ICD-10-CM | POA: Diagnosis not present

## 2019-05-22 DIAGNOSIS — Z8249 Family history of ischemic heart disease and other diseases of the circulatory system: Secondary | ICD-10-CM | POA: Diagnosis not present

## 2019-05-22 DIAGNOSIS — Z79899 Other long term (current) drug therapy: Secondary | ICD-10-CM | POA: Diagnosis not present

## 2019-05-22 DIAGNOSIS — Z510 Encounter for antineoplastic radiation therapy: Secondary | ICD-10-CM | POA: Diagnosis not present

## 2019-05-22 DIAGNOSIS — C770 Secondary and unspecified malignant neoplasm of lymph nodes of head, face and neck: Secondary | ICD-10-CM | POA: Diagnosis not present

## 2019-05-22 DIAGNOSIS — G2581 Restless legs syndrome: Secondary | ICD-10-CM | POA: Diagnosis not present

## 2019-05-22 DIAGNOSIS — I739 Peripheral vascular disease, unspecified: Secondary | ICD-10-CM | POA: Diagnosis not present

## 2019-05-22 DIAGNOSIS — C090 Malignant neoplasm of tonsillar fossa: Secondary | ICD-10-CM | POA: Diagnosis not present

## 2019-05-22 DIAGNOSIS — Z801 Family history of malignant neoplasm of trachea, bronchus and lung: Secondary | ICD-10-CM | POA: Diagnosis not present

## 2019-05-22 DIAGNOSIS — Z5111 Encounter for antineoplastic chemotherapy: Secondary | ICD-10-CM | POA: Diagnosis not present

## 2019-05-22 DIAGNOSIS — J449 Chronic obstructive pulmonary disease, unspecified: Secondary | ICD-10-CM | POA: Diagnosis not present

## 2019-05-22 LAB — COMPREHENSIVE METABOLIC PANEL
ALT: 16 U/L (ref 0–44)
AST: 20 U/L (ref 15–41)
Albumin: 3.7 g/dL (ref 3.5–5.0)
Alkaline Phosphatase: 49 U/L (ref 38–126)
Anion gap: 8 (ref 5–15)
BUN: 13 mg/dL (ref 8–23)
CO2: 22 mmol/L (ref 22–32)
Calcium: 9 mg/dL (ref 8.9–10.3)
Chloride: 109 mmol/L (ref 98–111)
Creatinine, Ser: 0.79 mg/dL (ref 0.61–1.24)
GFR calc Af Amer: >60 mL/min (ref >60)
GFR calc non Af Amer: >60 mL/min (ref >60)
Glucose, Bld: 100 mg/dL — ABNORMAL HIGH (ref 70–99)
Potassium: 4.1 mmol/L (ref 3.5–5.1)
Sodium: 139 mmol/L (ref 135–145)
Total Bilirubin: 0.4 mg/dL (ref 0.3–1.2)
Total Protein: 6.6 g/dL (ref 6.5–8.1)

## 2019-05-22 LAB — CBC WITH DIFFERENTIAL/PLATELET
Abs Immature Granulocytes: 0.01 10*3/uL (ref 0.00–0.07)
Basophils Absolute: 0 10*3/uL (ref 0.0–0.1)
Basophils Relative: 1 %
Eosinophils Absolute: 0.1 10*3/uL (ref 0.0–0.5)
Eosinophils Relative: 1 %
HCT: 37.6 % — ABNORMAL LOW (ref 39.0–52.0)
Hemoglobin: 11.8 g/dL — ABNORMAL LOW (ref 13.0–17.0)
Immature Granulocytes: 0 %
Lymphocytes Relative: 20 %
Lymphs Abs: 0.8 10*3/uL (ref 0.7–4.0)
MCH: 32.1 pg (ref 26.0–34.0)
MCHC: 31.4 g/dL (ref 30.0–36.0)
MCV: 102.2 fL — ABNORMAL HIGH (ref 80.0–100.0)
Monocytes Absolute: 0.4 10*3/uL (ref 0.1–1.0)
Monocytes Relative: 9 %
Neutro Abs: 2.8 10*3/uL (ref 1.7–7.7)
Neutrophils Relative %: 69 %
Platelets: 160 10*3/uL (ref 150–400)
RBC: 3.68 MIL/uL — ABNORMAL LOW (ref 4.22–5.81)
RDW: 13.9 % (ref 11.5–15.5)
WBC: 4 10*3/uL (ref 4.0–10.5)
nRBC: 0 % (ref 0.0–0.2)

## 2019-05-22 LAB — MAGNESIUM: Magnesium: 2 mg/dL (ref 1.7–2.4)

## 2019-05-22 MED ORDER — PALONOSETRON HCL INJECTION 0.25 MG/5ML
0.2500 mg | Freq: Once | INTRAVENOUS | Status: AC
  Administered 2019-05-22: 0.25 mg via INTRAVENOUS
  Filled 2019-05-22: qty 5

## 2019-05-22 MED ORDER — SODIUM CHLORIDE 0.9 % IV SOLN
10.0000 mg | Freq: Once | INTRAVENOUS | Status: AC
  Administered 2019-05-22: 10 mg via INTRAVENOUS
  Filled 2019-05-22: qty 10

## 2019-05-22 MED ORDER — SODIUM CHLORIDE 0.9 % IV SOLN: Freq: Once | INTRAVENOUS | Status: AC

## 2019-05-22 MED ORDER — POTASSIUM CHLORIDE 2 MEQ/ML IV SOLN
Freq: Once | INTRAVENOUS | Status: AC
  Filled 2019-05-22: qty 10

## 2019-05-22 MED ORDER — HEPARIN SOD (PORK) LOCK FLUSH 100 UNIT/ML IV SOLN
500.0000 [IU] | Freq: Once | INTRAVENOUS | Status: AC | PRN
  Administered 2019-05-22: 500 [IU]

## 2019-05-22 MED ORDER — SODIUM CHLORIDE 0.9 % IV SOLN
40.0000 mg/m2 | Freq: Once | INTRAVENOUS | Status: AC
  Administered 2019-05-22: 77 mg via INTRAVENOUS
  Filled 2019-05-22: qty 77

## 2019-05-22 MED ORDER — SODIUM CHLORIDE 0.9 % IV SOLN
150.0000 mg | Freq: Once | INTRAVENOUS | Status: AC
  Administered 2019-05-22: 150 mg via INTRAVENOUS
  Filled 2019-05-22: qty 150

## 2019-05-22 MED ORDER — SODIUM CHLORIDE 0.9% FLUSH
10.0000 mL | INTRAVENOUS | Status: DC | PRN
  Administered 2019-05-22: 10 mL

## 2019-05-22 NOTE — Progress Notes (Signed)
0921 Labs reviewed with and pt seen by Dr. Delton Coombes and pt approved for Cisplatin infusion today per MD                                  Cory Foley tolerated chemo tx with hydration well without complaints or incident. VSS upon discharge. Pt discharged self ambulatory in satisfactory condition

## 2019-05-22 NOTE — Patient Instructions (Signed)
[facility] Discharge Instructions for Patients Receiving Chemotherapy   Beginning January 23rd 2017 lab work for the Cancer Center will be done in the  Main lab at [facility] on 1st floor. If you have a lab appointment with the Cancer Center please come in thru the  Main Entrance and check in at the main information desk   Today you received the following chemotherapy agents Cisplatin.Follow-up as scheduled. Call clinic for any questions or concerns  To help prevent nausea and vomiting after your treatment, we encourage you to take your nausea medication   If you develop nausea and vomiting, or diarrhea that is not controlled by your medication, call the clinic.  The clinic phone number is [phone]. Office hours are Monday-Friday 8:30am-5:00pm.  BELOW ARE SYMPTOMS THAT SHOULD BE REPORTED IMMEDIATELY:  *FEVER GREATER THAN 101.0 F  *CHILLS WITH OR WITHOUT FEVER  NAUSEA AND VOMITING THAT IS NOT CONTROLLED WITH YOUR NAUSEA MEDICATION  *UNUSUAL SHORTNESS OF BREATH  *UNUSUAL BRUISING OR BLEEDING  TENDERNESS IN MOUTH AND THROAT WITH OR WITHOUT PRESENCE OF ULCERS  *URINARY PROBLEMS  *BOWEL PROBLEMS  UNUSUAL RASH Items with * indicate a potential emergency and should be followed up as soon as possible. If you have an emergency after office hours please contact your primary care physician or go to the nearest emergency department.  Please call the clinic during office hours if you have any questions or concerns.   You may also contact the Patient Navigator at [phone] should you have any questions or need assistance in obtaining follow up care.      Resources For Cancer Patients and their Caregivers ? American Cancer Society: Can assist with transportation, wigs, general needs, runs Look Good Feel Better.        1-888-227-6333 ? Cancer Care: Provides financial assistance, online support groups, medication/co-pay assistance.  1-800-813-HOPE  (4673) ? [facility] Assists [place] cancer patients and their families through emotional , educational and financial support.  336-427-4357 ? [place] DSS Where to apply for food stamps, Medicaid and utility assistance. 336-342-1394 ? [facility]: Transportation to medical appointments. 336-347-2287 ? Social Security Administration: May apply for disability if have a Stage IV cancer. 336-342-7796 1-800-772-1213 ? [place] Aging, Disability and Transit Services: Assists with nutrition, care and transit needs. 336-349-2343         

## 2019-05-22 NOTE — Assessment & Plan Note (Signed)
1.  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  No other metastatic disease. -Chemoradiation therapy with weekly cisplatin started on 05/08/2019. -Week 2 of treatment on 05/15/2019.  He reported having lost all the taste sensation.  However he can only taste chocolate ice cream. -He reported slight pain in the left side of the neck when swallowing. -He was treated for thrush with Diflucan.  Today examination did not show any thrush. -I have reviewed his labs.  White count is 4 and hemoglobin is 11.8.  Platelets are 160.  LFTs are normal.  Creatinine is also normal. -He will proceed with his week 3 of cisplatin today.  He was encouraged to continue aggressive hydration.  I will see him back in 1 week for follow-up.  2.  Nutrition: -He lost about 4 pounds in the last 2 weeks.  He lost his taste sensation completely last week.  He can only taste chocolate ice cream. -He was also treated with for thrush.  He started drinking boost 3 cans/day, 240 cal.  I have suggested him to drink high-calorie boost with 350 cal in each.  We will closely monitor his weight.  3.  Prostate cancer: -Gleason 9 prostate cancer, diagnosed in November 2013, status post XRT from April 2014 through May 2014 with 2 years of Lupron.  He has periodic PSAs checked and is continuing to be in remission.

## 2019-05-22 NOTE — Patient Instructions (Addendum)
[place] at [facility] - Inpatient Discharge Instructions  You were seen today by Dr. Delton Coombes. He went over your recent lab results. He will see you back in one week for labs, treatment and follow up.   Thank you for choosing [place] at [facility] to provide your oncology and hematology care.  To afford each patient quality time with our provider, please arrive at least 15 minutes before your scheduled appointment time.   If you have a lab appointment with the [facility] please come in thru the  Main Entrance and check in at the main information desk  You need to re-schedule your appointment should you arrive 10 or more minutes late.  We strive to give you quality time with our providers, and arriving late affects you and other patients whose appointments are after yours.  Also, if you no show three or more times for appointments you may be dismissed from the clinic at the providers discretion.     Again, thank you for choosing [facility].  Our hope is that these requests will decrease the amount of time that you wait before being seen by our physicians.       _____________________________________________________________  Should you have questions after your visit to [facility], please contact our office at [phone] between the hours of 8:00 a.m. and 4:30 p.m.  Voicemails left after 4:00 p.m. will not be returned until the following business day.  For prescription refill requests, have your pharmacy contact our office and allow 72 hours.    Cancer Center Support Programs:   > Cancer Support Group  2nd Tuesday of the month 1pm-2pm, Journey Room

## 2019-05-22 NOTE — Progress Notes (Signed)
Patient has been assessed, vital signs and labs have been reviewed by Dr. Katragadda. ANC, Creatinine, LFTs, and Platelets are within treatment parameters per Dr. Katragadda. The patient is good to proceed with treatment at this time.  

## 2019-05-22 NOTE — Patient Instructions (Signed)
[facility] at [facility] Discharge Instructions  Labs drawn from portacath today   Thank you for choosing [facility] at [facility] to provide your oncology and hematology care.  To afford each patient quality time with our provider, please arrive at least 15 minutes before your scheduled appointment time.   If you have a lab appointment with the Cancer Center please come in thru the Main Entrance and check in at the main information desk.  You need to re-schedule your appointment should you arrive 10 or more minutes late.  We strive to give you quality time with our providers, and arriving late affects you and other patients whose appointments are after yours.  Also, if you no show three or more times for appointments you may be dismissed from the clinic at the providers discretion.     Again, thank you for choosing [facility].  Our hope is that these requests will decrease the amount of time that you wait before being seen by our physicians.       _____________________________________________________________  Should you have questions after your visit to [facility], please contact our office at [phone] between the hours of 8:00 a.m. and 4:30 p.m.  Voicemails left after 4:00 p.m. will not be returned until the following business day.  For prescription refill requests, have your pharmacy contact our office and allow 72 hours.    Due to Covid, you will need to wear a mask upon entering the hospital. If you do not have a mask, a mask will be given to you at the Main Entrance upon arrival. For doctor visits, patients may have 1 support person with them. For treatment visits, patients can not have anyone with them due to social distancing guidelines and our immunocompromised population.     

## 2019-05-22 NOTE — Progress Notes (Signed)
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  CURRENT THERAPY: Chemoradiation therapy.  BRIEF ONCOLOGIC HISTORY:  Oncology History  Tonsillar cancer ([place])  04/07/2019 Initial Diagnosis   Tonsillar cancer ([place])   05/08/2019 -  Chemotherapy   The patient had palonosetron (ALOXI) injection 0.25 mg, 0.25 mg, Intravenous,  Once, 3 of 7 cycles Administration: 0.25 mg (05/08/2019), 0.25 mg (05/15/2019), 0.25 mg (05/22/2019) CISplatin (PLATINOL) 77 mg in sodium chloride 0.9 % 250 mL chemo infusion, 40 mg/m2 = 77 mg, Intravenous,  Once, 3 of 7 cycles Administration: 77 mg (05/08/2019), 77 mg (05/15/2019), 77 mg (05/22/2019) fosaprepitant (EMEND) 150 mg in sodium chloride 0.9 % 145 mL IVPB, 150 mg, Intravenous,  Once, 3 of 7 cycles Administration: 150 mg (05/08/2019), 150 mg (05/15/2019), 150 mg (05/22/2019)  for chemotherapy treatment.       CANCER STAGING: Cancer Staging Tonsillar cancer ([place]) Staging form: Pharynx - HPV-Mediated Oropharynx, AJCC 8th Edition - Clinical stage from 04/07/2019: Stage I (cT2, cN1, cM0, p16+) - Unsigned    INTERVAL HISTORY:  Mr. Abramowicz 69 y.o. male seen for follow-up of left tonsillar cancer and toxicity assessment prior to next cycle of chemotherapy.  Appetite is 25%.  Energy levels are 50%.  Reported pain in the mouth with thrush.  He was treated for thrush by Dr.Yanagihara.  He lost taste sensation after his last treatment.  He lost about 1 pound since last visit.  Denies any ringing in the ears.  Denies any nausea or vomiting.  Note tingling or numbness in the extremities.    REVIEW OF SYSTEMS:  Review of Systems  All other systems reviewed and are negative.    PAST MEDICAL/SURGICAL HISTORY:  Past Medical History:  Diagnosis Date    . Allergy   . Anxiety   . Arthritis   . BPH with obstruction/lower urinary tract symptoms   . COPD, mild ([place])    current smoker  . ED (erectile dysfunction)   . GERD (gastroesophageal reflux disease)   . Hyperlipidemia   . Nocturia   . Osteoporosis   . PAD (peripheral artery disease) ([place])   . Port-A-Cath in place 04/28/2019  . Prostate cancer ([place]) 01/11/2012   Adenocarcinoma, Gleason=4+4=8, & 4+5=9,PSA=17.24, Volume= 15.45cc  . PVD (peripheral vascular disease) ([place])   . Restless leg syndrome   . Tongue mass    SCC   Past Surgical History:  Procedure Laterality Date  . ABDOMINAL AORTOGRAM W/LOWER EXTREMITY N/A 08/16/2018   Procedure: ABDOMINAL AORTOGRAM W/LOWER EXTREMITY;  Surgeon: Serafina Mitchell, MD;  Location: [facility];  Service: Cardiovascular;  Laterality: N/A;  Bilateral  . COLONOSCOPY N/A 09/28/2012   Procedure: COLONOSCOPY;  Surgeon: Danie Binder, MD;  [facility];  Service: Endoscopy;  Laterality: N/A;  8:30  . DIRECT LARYNGOSCOPY N/A 04/03/2019   Procedure: DIRECT LARYNGOSCOPY;  Surgeon: Leta Baptist, MD;  Location: [facility];  Service: ENT;  Laterality: N/A;  . ESOPHAGOGASTRODUODENOSCOPY N/A 09/28/2012   Procedure: ESOPHAGOGASTRODUODENOSCOPY (EGD);  Surgeon: Danie Binder, MD;  [facility];  Service: Endoscopy;  Laterality: N/A;  . Gold seed implatation  04/28/2012  . HERNIA REPAIR     Bilateral inguinal X2  . IR IMAGING GUIDED PORT INSERTION  05/03/2019  . JOINT  REPLACEMENT     bilateral  . KNEE SURGERY     Left Knee X 2   and Right knee X1  . MALONEY DILATION N/A 09/28/2012   Procedure: MALONEY DILATION;  Surgeon: Danie Binder, MD;  [facility];  Service: Endoscopy;  Laterality: N/A;  . NOSE SURGERY    . PERIPHERAL VASCULAR INTERVENTION  08/16/2018   Procedure: PERIPHERAL VASCULAR INTERVENTION;  Surgeon: Serafina Mitchell, MD;  Location: [facility];  Service: Cardiovascular;;  LT Iliac  . PR VEIN  BYPASS GRAFT,AORTO-FEM-POP  10/21/10   Left AK to BK popliteal BPG  . PROSTATE BIOPSY  01/11/2012   Adenocarcinoma  . PROSTATE SURGERY  2015   Chemo and  Radiation  . SAVORY DILATION N/A 09/28/2012   Procedure: SAVORY DILATION;  Surgeon: Danie Binder, MD;  [facility];  Service: Endoscopy;  Laterality: N/A;  . throat biopsy  03/20/2019  . TONSILLECTOMY Left 04/03/2019   Procedure: TONSILLECTOMY;  Surgeon: Leta Baptist, MD;  Location: [facility];  Service: ENT;  Laterality: Left;     SOCIAL HISTORY:  Social History   Socioeconomic History  . Marital status: Married    Spouse name: Holley Raring  . Number of children: 2  . Years of education: College  . 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 Smoking status: Current Every Day Smoker    Packs/day: 0.50    Years: 43.00    Pack years: 21.50    Types: Cigarettes  . Smokeless tobacco: Never Used  . Tobacco comment: 1/2 ppd  Substance and Sexual Activity  . Alcohol use: No    Comment: quit 1.5 years ago  . Drug use: No  . Sexual activity: Not Currently  Other Topics Concern  . Not on file  Social History Narrative   Patient lives at home with spouse.   Caffeine use: very little   Social Determinants of Health   Financial Resource Strain: Low Risk   . Difficulty of Paying Living Expenses: Not hard at all  Food Insecurity: No Food Insecurity  . Worried About Charity fundraiser in the Last Year: Never true  . Ran Out of Food in the Last Year: Never true  Transportation Needs: No Transportation Needs  . Lack of Transportation (Medical): No  . Lack of Transportation (Non-Medical): No  Physical Activity: Sufficiently Active  . Days of Exercise per Week: 5 days  . Minutes of Exercise per Session: 30 min  Stress: Stress Concern Present  . Feeling of Stress : Rather much    Social Connections: Slightly Isolated  . Frequency of Communication with Friends and Family: Twice a week  . Frequency of Social Gatherings with Friends and Family: Twice a week  . Attends Religious Services: More than 4 times per year  . Active Member of Clubs or Organizations: No  . Attends Archivist Meetings: Never  . Marital Status: Married  Human resources officer Violence: Not At Risk  . Fear of Current or Ex-Partner: No  . Emotionally Abused: No  . Physically Abused: No  . Sexually Abused: No    FAMILY HISTORY:  Family History  Problem Relation Age of Onset  . Heart disease Mother        Valve regurgitation and Pacemaker   . Diabetes Mother   .  Hyperlipidemia Mother   . Heart disease Father        CABG x 5  . Hyperlipidemia Father   . Hypertension Father   . Heart attack Father   . Heart disease Sister        aortic valve replacement  . Cancer Sister 1       Colon cancer w/ metastasis  . Throat cancer Sister   . Hypertension Brother   . Heart disease Maternal Grandmother   . Heart disease Maternal Grandfather   . Heart disease Paternal Grandmother   . Cancer Paternal Grandfather   . Healthy Son   . Healthy Daughter     CURRENT MEDICATIONS:  Outpatient Encounter Medications as of 05/22/2019  Medication Sig Note  . aspirin EC 81 MG tablet Take 81 mg by mouth daily.   . calcium carbonate (OS-CAL) 600 MG tablet Take 1 tablet (600 mg total) by mouth 2 (two) times daily with a meal. (Patient taking differently: Take 600 mg by mouth 2 (two) times daily. )   . Cholecalciferol (VITAMIN D) 50 MCG (2000 UT) tablet Take 2,000 Units by mouth in the morning and at bedtime.   Marland Kitchen CISPLATIN IV Inject into the vein once a week.   . clopidogrel (PLAVIX) 75 MG tablet Take 75 mg by mouth daily.    . cyclobenzaprine (FLEXERIL) 5 MG tablet Take 5 mg by mouth at bedtime as needed for muscle spasms.    . famotidine (PEPCID) 20 MG tablet TAKE 1 TABLET BY MOUTH AT BEDTIME. (Patient  taking differently: Take 20 mg by mouth at bedtime. )   . fluconazole (DIFLUCAN) 100 MG tablet TAKE (2) TABLETS TODAYFTHEN TAKE (1) TABLET.DAILY UNTIL GONE.   Marland Kitchen HYDROcodone-acetaminophen (NORCO) 10-325 MG tablet Take 1 tablet by mouth every 4 (four) hours as needed. Chronic Pain. Dx: G89.4   . lidocaine-prilocaine (EMLA) cream Apply a pea-sized amount to port a cath site and cover with plastic wrap 1 hour prior to appointment   . methocarbamol (ROBAXIN) 500 MG tablet Take 500 mg by mouth every 6 (six) hours as needed for muscle spasms.    . mirtazapine (REMERON) 15 MG tablet Take 1 tablet (15 mg total) by mouth at bedtime.   . pantoprazole (PROTONIX) 40 MG tablet TAKE ONE TABLET BY MOUTH DAILY. (Patient taking differently: Take 40 mg by mouth daily. )   . prochlorperazine (COMPAZINE) 10 MG tablet Take 1 tablet (10 mg total) by mouth every 6 (six) hours as needed (Nausea or vomiting).   Marland Kitchen rOPINIRole (REQUIP) 3 MG tablet TAKE (1) TABLET BY MOUTH AT BEDTIME. (Patient taking differently: Take 3 mg by mouth at bedtime. )   . simvastatin (ZOCOR) 40 MG tablet TAKE (1) TABLET BY MOUTH AT BEDTIME. (Patient taking differently: Take 40 mg by mouth at bedtime. )   . topiramate (TOPAMAX) 25 MG tablet TAKE 3 TABLETS BY MOUTH DAILY. (Patient taking differently: Take 50-75 mg by mouth at bedtime. ) 04/27/2019: If pt is having frequent migraines he'll take 75 mg at night, if not he'll take 50 mg at night   No facility-administered encounter medications on file as of 05/22/2019.    ALLERGIES:  Allergies  Allergen Reactions  . Codeine Other (See Comments)    Bad headache and sweats  . Morphine And Related Itching    sweats  . Percocet [Oxycodone-Acetaminophen] Other (See Comments)    Headache and sweats     PHYSICAL EXAM:  ECOG Performance status: 1  Vitals:   05/22/19  0847  BP: 127/71  Pulse: (!) 58  Resp: 18  Temp: (!) 96.9 F (36.1 C)  SpO2: 100%   Filed Weights   05/22/19 0847  Weight: 169 lb  3.2 oz (76.7 kg)    Physical Exam Vitals reviewed.  Constitutional:      Appearance: Normal appearance.  HENT:     Mouth/Throat:     Mouth: Mucous membranes are moist.  Cardiovascular:     Rate and Rhythm: Normal rate and regular rhythm.     Heart sounds: Normal heart sounds.  Pulmonary:     Effort: Pulmonary effort is normal.     Breath sounds: Normal breath sounds.  Abdominal:     General: There is no distension.     Palpations: Abdomen is soft.  Lymphadenopathy:     Cervical: Cervical adenopathy present.  Skin:    General: Skin is warm.  Neurological:     General: No focal deficit present.     Mental Status: He is alert and oriented to person, place, and time.  Psychiatric:        Mood and Affect: Mood normal.        Behavior: Behavior normal.      LABORATORY DATA:  I have reviewed the labs as listed.  CBC    Component Value Date/Time   WBC 4.0 05/22/2019 0854   RBC 3.68 (L) 05/22/2019 0854   HGB 11.8 (L) 05/22/2019 0854   HCT 37.6 (L) 05/22/2019 0854   PLT 160 05/22/2019 0854   MCV 102.2 (H) 05/22/2019 0854   MCH 32.1 05/22/2019 0854   MCHC 31.4 05/22/2019 0854   RDW 13.9 05/22/2019 0854   LYMPHSABS 0.8 05/22/2019 0854   MONOABS 0.4 05/22/2019 0854   EOSABS 0.1 05/22/2019 0854   BASOSABS 0.0 05/22/2019 0854   CMP Latest Ref Rng & Units 05/22/2019 05/15/2019 05/08/2019  Glucose 70 - 99 mg/dL 100(H) 101(H) 94  BUN 8 - 23 mg/dL 13 14 8   Creatinine 0.61 - 1.24 mg/dL 0.79 0.88 0.83  Sodium 135 - 145 mmol/L 139 138 141  Potassium 3.5 - 5.1 mmol/L 4.1 4.0 4.1  Chloride 98 - 111 mmol/L 109 106 111  CO2 22 - 32 mmol/L 22 24 23   Calcium 8.9 - 10.3 mg/dL 9.0 9.2 8.9  Total Protein 6.5 - 8.1 g/dL 6.6 6.9 6.6  Total Bilirubin 0.3 - 1.2 mg/dL 0.4 0.5 0.4  Alkaline Phos 38 - 126 U/L 49 57 57  AST 15 - 41 U/L 20 16 15   ALT 0 - 44 U/L 16 13 12        DIAGNOSTIC IMAGING:  I have reviewed the scans.    ASSESSMENT & PLAN:   Tonsillar cancer ([place]) 1.  T2N1  squamous cell carcinoma of the left tonsil, p16 positive: -PET scan on 03/27/2019 shows uptake in the left tonsillar fossa and 1.5 cm lymph node in the left level 2 area.  No other metastatic disease. -Chemoradiation therapy with weekly cisplatin started on 05/08/2019. -Week 2 of treatment on 05/15/2019.  He reported having lost all the taste sensation.  However he can only taste chocolate ice cream. -He reported slight pain in the left side of the neck when swallowing. -He was treated for thrush with Diflucan.  Today examination did not show any thrush. -I have reviewed his labs.  White count is 4 and hemoglobin is 11.8.  Platelets are 160.  LFTs are normal.  Creatinine is also normal. -He will proceed with his week 3 of cisplatin  today.  He was encouraged to continue aggressive hydration.  I will see him back in 1 week for follow-up.  2.  Nutrition: -He lost about 4 pounds in the last 2 weeks.  He lost his taste sensation completely last week.  He can only taste chocolate ice cream. -He was also treated with for thrush.  He started drinking boost 3 cans/day, 240 cal.  I have suggested him to drink high-calorie boost with 350 cal in each.  We will closely monitor his weight.  3.  Prostate cancer: -Gleason 9 prostate cancer, diagnosed in November 2013, status post XRT from April 2014 through May 2014 with 2 years of Lupron.  He has periodic PSAs checked and is continuing to be in remission.      Orders placed this encounter:  No orders of the defined types were placed in this encounter.     Derek Jack, MD [place] [phone]

## 2019-05-23 DIAGNOSIS — Z510 Encounter for antineoplastic radiation therapy: Secondary | ICD-10-CM | POA: Diagnosis not present

## 2019-05-23 DIAGNOSIS — C090 Malignant neoplasm of tonsillar fossa: Secondary | ICD-10-CM | POA: Diagnosis not present

## 2019-05-23 NOTE — Progress Notes (Signed)

## 2019-05-24 DIAGNOSIS — Z510 Encounter for antineoplastic radiation therapy: Secondary | ICD-10-CM | POA: Diagnosis not present

## 2019-05-24 DIAGNOSIS — C090 Malignant neoplasm of tonsillar fossa: Secondary | ICD-10-CM | POA: Diagnosis not present

## 2019-05-25 DIAGNOSIS — Z510 Encounter for antineoplastic radiation therapy: Secondary | ICD-10-CM | POA: Diagnosis not present

## 2019-05-25 DIAGNOSIS — C090 Malignant neoplasm of tonsillar fossa: Secondary | ICD-10-CM | POA: Diagnosis not present

## 2019-05-27 ENCOUNTER — Other Ambulatory Visit: Payer: Self-pay | Admitting: Family Medicine

## 2019-05-29 ENCOUNTER — Other Ambulatory Visit: Payer: Self-pay

## 2019-05-29 ENCOUNTER — Inpatient Hospital Stay (HOSPITAL_COMMUNITY): Payer: Medicare Other | Attending: Hematology | Admitting: Hematology

## 2019-05-29 ENCOUNTER — Encounter (HOSPITAL_COMMUNITY): Payer: Self-pay | Admitting: Hematology

## 2019-05-29 ENCOUNTER — Inpatient Hospital Stay (HOSPITAL_COMMUNITY): Payer: Medicare Other

## 2019-05-29 VITALS — BP 99/55 | HR 57 | Temp 97.6°F | Resp 17

## 2019-05-29 DIAGNOSIS — Z79899 Other long term (current) drug therapy: Secondary | ICD-10-CM | POA: Insufficient documentation

## 2019-05-29 DIAGNOSIS — F17210 Nicotine dependence, cigarettes, uncomplicated: Secondary | ICD-10-CM | POA: Insufficient documentation

## 2019-05-29 DIAGNOSIS — G2581 Restless legs syndrome: Secondary | ICD-10-CM | POA: Insufficient documentation

## 2019-05-29 DIAGNOSIS — Z510 Encounter for antineoplastic radiation therapy: Secondary | ICD-10-CM | POA: Diagnosis not present

## 2019-05-29 DIAGNOSIS — F419 Anxiety disorder, unspecified: Secondary | ICD-10-CM | POA: Insufficient documentation

## 2019-05-29 DIAGNOSIS — Z8546 Personal history of malignant neoplasm of prostate: Secondary | ICD-10-CM | POA: Insufficient documentation

## 2019-05-29 DIAGNOSIS — C099 Malignant neoplasm of tonsil, unspecified: Secondary | ICD-10-CM

## 2019-05-29 DIAGNOSIS — Z5111 Encounter for antineoplastic chemotherapy: Secondary | ICD-10-CM | POA: Diagnosis not present

## 2019-05-29 DIAGNOSIS — Z923 Personal history of irradiation: Secondary | ICD-10-CM | POA: Diagnosis not present

## 2019-05-29 DIAGNOSIS — C770 Secondary and unspecified malignant neoplasm of lymph nodes of head, face and neck: Secondary | ICD-10-CM | POA: Insufficient documentation

## 2019-05-29 DIAGNOSIS — Z8249 Family history of ischemic heart disease and other diseases of the circulatory system: Secondary | ICD-10-CM | POA: Insufficient documentation

## 2019-05-29 DIAGNOSIS — E785 Hyperlipidemia, unspecified: Secondary | ICD-10-CM | POA: Insufficient documentation

## 2019-05-29 DIAGNOSIS — Z800 Family history of malignant neoplasm of digestive organs: Secondary | ICD-10-CM | POA: Diagnosis not present

## 2019-05-29 DIAGNOSIS — Z9221 Personal history of antineoplastic chemotherapy: Secondary | ICD-10-CM | POA: Insufficient documentation

## 2019-05-29 DIAGNOSIS — Z801 Family history of malignant neoplasm of trachea, bronchus and lung: Secondary | ICD-10-CM | POA: Diagnosis not present

## 2019-05-29 DIAGNOSIS — Z5189 Encounter for other specified aftercare: Secondary | ICD-10-CM | POA: Diagnosis not present

## 2019-05-29 DIAGNOSIS — C090 Malignant neoplasm of tonsillar fossa: Secondary | ICD-10-CM | POA: Insufficient documentation

## 2019-05-29 DIAGNOSIS — L589 Radiodermatitis, unspecified: Secondary | ICD-10-CM | POA: Insufficient documentation

## 2019-05-29 DIAGNOSIS — Z95828 Presence of other vascular implants and grafts: Secondary | ICD-10-CM

## 2019-05-29 DIAGNOSIS — Z833 Family history of diabetes mellitus: Secondary | ICD-10-CM | POA: Insufficient documentation

## 2019-05-29 DIAGNOSIS — J449 Chronic obstructive pulmonary disease, unspecified: Secondary | ICD-10-CM | POA: Insufficient documentation

## 2019-05-29 DIAGNOSIS — Z7982 Long term (current) use of aspirin: Secondary | ICD-10-CM | POA: Diagnosis not present

## 2019-05-29 LAB — CBC WITH DIFFERENTIAL/PLATELET
Abs Immature Granulocytes: 0.01 10*3/uL (ref 0.00–0.07)
Basophils Absolute: 0 10*3/uL (ref 0.0–0.1)
Basophils Relative: 1 %
Eosinophils Absolute: 0 10*3/uL (ref 0.0–0.5)
Eosinophils Relative: 1 %
HCT: 36.9 % — ABNORMAL LOW (ref 39.0–52.0)
Hemoglobin: 11.8 g/dL — ABNORMAL LOW (ref 13.0–17.0)
Immature Granulocytes: 0 %
Lymphocytes Relative: 19 %
Lymphs Abs: 0.5 10*3/uL — ABNORMAL LOW (ref 0.7–4.0)
MCH: 32.2 pg (ref 26.0–34.0)
MCHC: 32 g/dL (ref 30.0–36.0)
MCV: 100.5 fL — ABNORMAL HIGH (ref 80.0–100.0)
Monocytes Absolute: 0.4 10*3/uL (ref 0.1–1.0)
Monocytes Relative: 13 %
Neutro Abs: 1.9 10*3/uL (ref 1.7–7.7)
Neutrophils Relative %: 66 %
Platelets: 130 10*3/uL — ABNORMAL LOW (ref 150–400)
RBC: 3.67 MIL/uL — ABNORMAL LOW (ref 4.22–5.81)
RDW: 13.5 % (ref 11.5–15.5)
WBC: 2.9 10*3/uL — ABNORMAL LOW (ref 4.0–10.5)
nRBC: 0 % (ref 0.0–0.2)

## 2019-05-29 LAB — COMPREHENSIVE METABOLIC PANEL
ALT: 21 U/L (ref 0–44)
AST: 21 U/L (ref 15–41)
Albumin: 4 g/dL (ref 3.5–5.0)
Alkaline Phosphatase: 48 U/L (ref 38–126)
Anion gap: 8 (ref 5–15)
BUN: 15 mg/dL (ref 8–23)
CO2: 25 mmol/L (ref 22–32)
Calcium: 9.5 mg/dL (ref 8.9–10.3)
Chloride: 105 mmol/L (ref 98–111)
Creatinine, Ser: 0.88 mg/dL (ref 0.61–1.24)
GFR calc Af Amer: >60 mL/min (ref >60)
GFR calc non Af Amer: >60 mL/min (ref >60)
Glucose, Bld: 99 mg/dL (ref 70–99)
Potassium: 4.1 mmol/L (ref 3.5–5.1)
Sodium: 138 mmol/L (ref 135–145)
Total Bilirubin: 0.7 mg/dL (ref 0.3–1.2)
Total Protein: 6.7 g/dL (ref 6.5–8.1)

## 2019-05-29 LAB — MAGNESIUM: Magnesium: 1.9 mg/dL (ref 1.7–2.4)

## 2019-05-29 MED ORDER — SODIUM CHLORIDE 0.9 % IV SOLN
150.0000 mg | Freq: Once | INTRAVENOUS | Status: AC
  Administered 2019-05-29: 150 mg via INTRAVENOUS
  Filled 2019-05-29: qty 150

## 2019-05-29 MED ORDER — SODIUM CHLORIDE 0.9 % IV SOLN
40.0000 mg/m2 | Freq: Once | INTRAVENOUS | Status: AC
  Administered 2019-05-29: 77 mg via INTRAVENOUS
  Filled 2019-05-29: qty 77

## 2019-05-29 MED ORDER — HEPARIN SOD (PORK) LOCK FLUSH 100 UNIT/ML IV SOLN
500.0000 [IU] | Freq: Once | INTRAVENOUS | Status: AC | PRN
  Administered 2019-05-29: 500 [IU]

## 2019-05-29 MED ORDER — SODIUM CHLORIDE 0.9 % IV SOLN
10.0000 mg | Freq: Once | INTRAVENOUS | Status: AC
  Administered 2019-05-29: 10 mg via INTRAVENOUS
  Filled 2019-05-29: qty 10

## 2019-05-29 MED ORDER — SODIUM CHLORIDE 0.9% FLUSH
10.0000 mL | INTRAVENOUS | Status: DC | PRN
  Administered 2019-05-29: 10 mL

## 2019-05-29 MED ORDER — POTASSIUM CHLORIDE 2 MEQ/ML IV SOLN
Freq: Once | INTRAVENOUS | Status: AC
  Filled 2019-05-29: qty 10

## 2019-05-29 MED ORDER — PALONOSETRON HCL INJECTION 0.25 MG/5ML
0.2500 mg | Freq: Once | INTRAVENOUS | Status: AC
  Administered 2019-05-29: 0.25 mg via INTRAVENOUS
  Filled 2019-05-29: qty 5

## 2019-05-29 MED ORDER — SODIUM CHLORIDE 0.9 % IV SOLN: Freq: Once | INTRAVENOUS | Status: AC

## 2019-05-29 NOTE — Patient Instructions (Signed)
[facility] Discharge Instructions for Patients Receiving Chemotherapy  Today you received the following chemotherapy agents   To help prevent nausea and vomiting after your treatment, we encourage you to take your nausea medication   If you develop nausea and vomiting that is not controlled by your nausea medication, call the clinic.   BELOW ARE SYMPTOMS THAT SHOULD BE REPORTED IMMEDIATELY:  *FEVER GREATER THAN 100.5 F  *CHILLS WITH OR WITHOUT FEVER  NAUSEA AND VOMITING THAT IS NOT CONTROLLED WITH YOUR NAUSEA MEDICATION  *UNUSUAL SHORTNESS OF BREATH  *UNUSUAL BRUISING OR BLEEDING  TENDERNESS IN MOUTH AND THROAT WITH OR WITHOUT PRESENCE OF ULCERS  *URINARY PROBLEMS  *BOWEL PROBLEMS  UNUSUAL RASH Items with * indicate a potential emergency and should be followed up as soon as possible.  Feel free to call the clinic should you have any questions or concerns. The clinic phone number is [phone].  Please show the CHEMO ALERT CARD at check-in to the Emergency Department and triage nurse.   

## 2019-05-29 NOTE — Progress Notes (Signed)
Patient presents today for treatment and follow up visit with Dr. Delton Coombes. Labs pending. Patient has complaints of fatigue not relieved by rest. Patient denies any pain today. Patient states his mouth sores are much better and he is using Magic Mouthwash as prescribed and there is no longer any discomfort per patient's words. Vital signs within parameters for treatment.   Message received from [facility] LPN/ Dr. Delton Coombes. Proceed with treatment.   Patient voided 250 mls.  Treatment given today per MD orders. Tolerated infusion without adverse affects. Vital signs stable. No complaints at this time. Discharged from clinic ambulatory. F/U with [facility] as scheduled.

## 2019-05-29 NOTE — Telephone Encounter (Signed)
Ok to refill??  Last office visit 03/20/2019.  Last refill 04/24/2019.

## 2019-05-29 NOTE — Patient Instructions (Addendum)
[place] at [facility] Discharge Instructions  You were seen today by Dr. Delton Coombes. He went over your recent lab results. Dr. Delton Coombes wants you to increase your Boost intake to 7 per day due to decrease in appetite and weight loss.  He will see you back in one week for labs, treatment and follow up.   Thank you for choosing [place] at [facility] to provide your oncology and hematology care.  To afford each patient quality time with our provider, please arrive at least 15 minutes before your scheduled appointment time.   If you have a lab appointment with the [facility] please come in thru the  Main Entrance and check in at the main information desk  You need to re-schedule your appointment should you arrive 10 or more minutes late.  We strive to give you quality time with our providers, and arriving late affects you and other patients whose appointments are after yours.  Also, if you no show three or more times for appointments you may be dismissed from the clinic at the providers discretion.     Again, thank you for choosing [facility].  Our hope is that these requests will decrease the amount of time that you wait before being seen by our physicians.       _____________________________________________________________  Should you have questions after your visit to [facility], please contact our office at [phone] between the hours of 8:00 a.m. and 4:30 p.m.  Voicemails left after 4:00 p.m. will not be returned until the following business day.  For prescription refill requests, have your pharmacy contact our office and allow 72 hours.    Cancer Center Support Programs:   > Cancer Support Group  2nd Tuesday of the month 1pm-2pm, Journey Room

## 2019-05-29 NOTE — Progress Notes (Signed)
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  CURRENT THERAPY: Chemoradiation therapy.  BRIEF ONCOLOGIC HISTORY:  Oncology History  Tonsillar cancer ([place])  04/07/2019 Initial Diagnosis   Tonsillar cancer ([place])   05/08/2019 -  Chemotherapy   The patient had palonosetron (ALOXI) injection 0.25 mg, 0.25 mg, Intravenous,  Once, 4 of 7 cycles Administration: 0.25 mg (05/08/2019), 0.25 mg (05/15/2019), 0.25 mg (05/22/2019), 0.25 mg (05/29/2019) CISplatin (PLATINOL) 77 mg in sodium chloride 0.9 % 250 mL chemo infusion, 40 mg/m2 = 77 mg, Intravenous,  Once, 4 of 7 cycles Administration: 77 mg (05/08/2019), 77 mg (05/15/2019), 77 mg (05/22/2019), 77 mg (05/29/2019) fosaprepitant (EMEND) 150 mg in sodium chloride 0.9 % 145 mL IVPB, 150 mg, Intravenous,  Once, 4 of 7 cycles Administration: 150 mg (05/08/2019), 150 mg (05/15/2019), 150 mg (05/22/2019), 150 mg (05/29/2019)  for chemotherapy treatment.       CANCER STAGING: Cancer Staging Tonsillar cancer ([place]) Staging form: Pharynx - HPV-Mediated Oropharynx, AJCC 8th Edition - Clinical stage from 04/07/2019: Stage I (cT2, cN1, cM0, p16+) - Unsigned    INTERVAL HISTORY:  Mr. Leeds 69 y.o. male seen for follow-up of tonsillar cancer and toxicity assessment prior to next cycle of chemotherapy.  Reports decrease in appetite.  He is not eating any solid foods.  He is drinking 3 cans of regular boost per day.  He can taste chocolate boost.  Also reports decrease in energy levels.  Denies any tingling or numbness in extremities.  Denies any ringing in the ears.  Oral thrush has improved.   REVIEW OF SYSTEMS:  Review of Systems  All other systems reviewed and are negative.    PAST MEDICAL/SURGICAL HISTORY:  Past Medical History:    Diagnosis Date  . Allergy   . Anxiety   . Arthritis   . BPH with obstruction/lower urinary tract symptoms   . COPD, mild ([place])    current smoker  . ED (erectile dysfunction)   . GERD (gastroesophageal reflux disease)   . Hyperlipidemia   . Nocturia   . Osteoporosis   . PAD (peripheral artery disease) ([place])   . Port-A-Cath in place 04/28/2019  . Prostate cancer ([place]) 01/11/2012   Adenocarcinoma, Gleason=4+4=8, & 4+5=9,PSA=17.24, Volume= 15.45cc  . PVD (peripheral vascular disease) ([place])   . Restless leg syndrome   . Tongue mass    SCC   Past Surgical History:  Procedure Laterality Date  . ABDOMINAL AORTOGRAM W/LOWER EXTREMITY N/A 08/16/2018   Procedure: ABDOMINAL AORTOGRAM W/LOWER EXTREMITY;  Surgeon: Serafina Mitchell, MD;  Location: [facility];  Service: Cardiovascular;  Laterality: N/A;  Bilateral  . COLONOSCOPY N/A 09/28/2012   Procedure: COLONOSCOPY;  Surgeon: Danie Binder, MD;  [facility];  Service: Endoscopy;  Laterality: N/A;  8:30  . DIRECT LARYNGOSCOPY N/A 04/03/2019   Procedure: DIRECT LARYNGOSCOPY;  Surgeon: Leta Baptist, MD;  Location: [facility];  Service: ENT;  Laterality: N/A;  . ESOPHAGOGASTRODUODENOSCOPY N/A 09/28/2012   Procedure: ESOPHAGOGASTRODUODENOSCOPY (EGD);  Surgeon: Danie Binder, MD;  [facility];  Service: Endoscopy;  Laterality: N/A;  . Gold seed implatation  04/28/2012  . HERNIA REPAIR     Bilateral inguinal X2  . IR IMAGING GUIDED PORT INSERTION  05/03/2019  . JOINT  REPLACEMENT     bilateral  . KNEE SURGERY     Left Knee X 2   and Right knee X1  . MALONEY DILATION N/A 09/28/2012   Procedure: MALONEY DILATION;  Surgeon: Danie Binder, MD;  [facility];  Service: Endoscopy;  Laterality: N/A;  . NOSE SURGERY    . PERIPHERAL VASCULAR INTERVENTION  08/16/2018   Procedure: PERIPHERAL VASCULAR INTERVENTION;  Surgeon: Serafina Mitchell, MD;  Location: [facility];  Service: Cardiovascular;;  LT  Iliac  . PR VEIN BYPASS GRAFT,AORTO-FEM-POP  10/21/10   Left AK to BK popliteal BPG  . PROSTATE BIOPSY  01/11/2012   Adenocarcinoma  . PROSTATE SURGERY  2015   Chemo and  Radiation  . SAVORY DILATION N/A 09/28/2012   Procedure: SAVORY DILATION;  Surgeon: Danie Binder, MD;  [facility];  Service: Endoscopy;  Laterality: N/A;  . throat biopsy  03/20/2019  . TONSILLECTOMY Left 04/03/2019   Procedure: TONSILLECTOMY;  Surgeon: Leta Baptist, MD;  Location: [facility];  Service: ENT;  Laterality: Left;     SOCIAL HISTORY:  Social History   Socioeconomic History  . Marital status: Married    Spouse name: Holley Raring  . Number of children: 2  . Years of education: College  . 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 Smoking status: Current Every Day Smoker    Packs/day: 0.50    Years: 43.00    Pack years: 21.50    Types: Cigarettes  . Smokeless tobacco: Never Used  . Tobacco comment: 1/2 ppd  Substance and Sexual Activity  . Alcohol use: No    Comment: quit 1.5 years ago  . Drug use: No  . Sexual activity: Not Currently  Other Topics Concern  . Not on file  Social History Narrative   Patient lives at home with spouse.   Caffeine use: very little   Social Determinants of Health   Financial Resource Strain: Low Risk   . Difficulty of Paying Living Expenses: Not hard at all  Food Insecurity: No Food Insecurity  . Worried About Charity fundraiser in the Last Year: Never true  . Ran Out of Food in the Last Year: Never true  Transportation Needs: No Transportation Needs  . Lack of Transportation (Medical): No  . Lack of Transportation (Non-Medical): No  Physical Activity: Sufficiently Active  . Days of Exercise per Week: 5 days  . Minutes of Exercise per Session: 30 min  Stress: Stress Concern Present  . Feeling of Stress :  Rather much  Social Connections: Slightly Isolated  . Frequency of Communication with Friends and Family: Twice a week  . Frequency of Social Gatherings with Friends and Family: Twice a week  . Attends Religious Services: More than 4 times per year  . Active Member of Clubs or Organizations: No  . Attends Archivist Meetings: Never  . Marital Status: Married  Human resources officer Violence: Not At Risk  . Fear of Current or Ex-Partner: No  . Emotionally Abused: No  . Physically Abused: No  . Sexually Abused: No    FAMILY HISTORY:  Family History  Problem Relation Age of Onset  . Heart disease Mother        Valve regurgitation and Pacemaker   . Diabetes Mother   .  Hyperlipidemia Mother   . Heart disease Father        CABG x 5  . Hyperlipidemia Father   . Hypertension Father   . Heart attack Father   . Heart disease Sister        aortic valve replacement  . Cancer Sister 58       Colon cancer w/ metastasis  . Throat cancer Sister   . Hypertension Brother   . Heart disease Maternal Grandmother   . Heart disease Maternal Grandfather   . Heart disease Paternal Grandmother   . Cancer Paternal Grandfather   . Healthy Son   . Healthy Daughter     CURRENT MEDICATIONS:  Outpatient Encounter Medications as of 05/29/2019  Medication Sig Note  . aspirin EC 81 MG tablet Take 81 mg by mouth daily.   . calcium carbonate (OS-CAL) 600 MG tablet Take 1 tablet (600 mg total) by mouth 2 (two) times daily with a meal. (Patient taking differently: Take 600 mg by mouth 2 (two) times daily. )   . Cholecalciferol (VITAMIN D) 50 MCG (2000 UT) tablet Take 2,000 Units by mouth in the morning and at bedtime.   Marland Kitchen CISPLATIN IV Inject into the vein once a week.   . clopidogrel (PLAVIX) 75 MG tablet Take 75 mg by mouth daily.    . cyclobenzaprine (FLEXERIL) 5 MG tablet Take 5 mg by mouth at bedtime as needed for muscle spasms.    . famotidine (PEPCID) 20 MG tablet TAKE 1 TABLET BY MOUTH AT  BEDTIME. (Patient taking differently: Take 20 mg by mouth at bedtime. )   . fluconazole (DIFLUCAN) 100 MG tablet TAKE (2) TABLETS TODAYFTHEN TAKE (1) TABLET.DAILY UNTIL GONE.   Marland Kitchen lidocaine-prilocaine (EMLA) cream Apply a pea-sized amount to port a cath site and cover with plastic wrap 1 hour prior to appointment   . methocarbamol (ROBAXIN) 500 MG tablet Take 500 mg by mouth every 6 (six) hours as needed for muscle spasms.    . mirtazapine (REMERON) 15 MG tablet Take 1 tablet (15 mg total) by mouth at bedtime.   . pantoprazole (PROTONIX) 40 MG tablet TAKE ONE TABLET BY MOUTH DAILY. (Patient taking differently: Take 40 mg by mouth daily. )   . prochlorperazine (COMPAZINE) 10 MG tablet Take 1 tablet (10 mg total) by mouth every 6 (six) hours as needed (Nausea or vomiting).   . simvastatin (ZOCOR) 40 MG tablet TAKE (1) TABLET BY MOUTH AT BEDTIME. (Patient taking differently: Take 40 mg by mouth at bedtime. )   . topiramate (TOPAMAX) 25 MG tablet TAKE 3 TABLETS BY MOUTH DAILY. (Patient taking differently: Take 50-75 mg by mouth at bedtime. ) 04/27/2019: If pt is having frequent migraines he'll take 75 mg at night, if not he'll take 50 mg at night  . [DISCONTINUED] HYDROcodone-acetaminophen (NORCO) 10-325 MG tablet Take 1 tablet by mouth every 4 (four) hours as needed. Chronic Pain. Dx: G89.4   . [DISCONTINUED] rOPINIRole (REQUIP) 3 MG tablet TAKE (1) TABLET BY MOUTH AT BEDTIME. (Patient taking differently: Take 3 mg by mouth at bedtime. )    No facility-administered encounter medications on file as of 05/29/2019.    ALLERGIES:  Allergies  Allergen Reactions  . Codeine Other (See Comments)    Bad headache and sweats  . Morphine And Related Itching    sweats  . Percocet [Oxycodone-Acetaminophen] Other (See Comments)    Headache and sweats     PHYSICAL EXAM:  ECOG Performance status: 1  Vitals:  05/29/19 0835  BP: 117/68  Pulse: 66  Resp: 18  Temp: 97.6 F (36.4 C)  SpO2: 100%   Filed  Weights   05/29/19 0835  Weight: 162 lb 9.6 oz (73.8 kg)    Physical Exam Vitals reviewed.  Constitutional:      Appearance: Normal appearance.  HENT:     Mouth/Throat:     Mouth: Mucous membranes are moist.  Cardiovascular:     Rate and Rhythm: Normal rate and regular rhythm.     Heart sounds: Normal heart sounds.  Pulmonary:     Effort: Pulmonary effort is normal.     Breath sounds: Normal breath sounds.  Abdominal:     General: There is no distension.     Palpations: Abdomen is soft.  Lymphadenopathy:     Cervical: Cervical adenopathy present.  Skin:    General: Skin is warm.  Neurological:     General: No focal deficit present.     Mental Status: He is alert and oriented to person, place, and time.  Psychiatric:        Mood and Affect: Mood normal.        Behavior: Behavior normal.      LABORATORY DATA:  I have reviewed the labs as listed.  CBC    Component Value Date/Time   WBC 2.9 (L) 05/29/2019 0853   RBC 3.67 (L) 05/29/2019 0853   HGB 11.8 (L) 05/29/2019 0853   HCT 36.9 (L) 05/29/2019 0853   PLT 130 (L) 05/29/2019 0853   MCV 100.5 (H) 05/29/2019 0853   MCH 32.2 05/29/2019 0853   MCHC 32.0 05/29/2019 0853   RDW 13.5 05/29/2019 0853   LYMPHSABS 0.5 (L) 05/29/2019 0853   MONOABS 0.4 05/29/2019 0853   EOSABS 0.0 05/29/2019 0853   BASOSABS 0.0 05/29/2019 0853   CMP Latest Ref Rng & Units 05/29/2019 05/22/2019 05/15/2019  Glucose 70 - 99 mg/dL 99 100(H) 101(H)  BUN 8 - 23 mg/dL 15 13 14   Creatinine 0.61 - 1.24 mg/dL 0.88 0.79 0.88  Sodium 135 - 145 mmol/L 138 139 138  Potassium 3.5 - 5.1 mmol/L 4.1 4.1 4.0  Chloride 98 - 111 mmol/L 105 109 106  CO2 22 - 32 mmol/L 25 22 24   Calcium 8.9 - 10.3 mg/dL 9.5 9.0 9.2  Total Protein 6.5 - 8.1 g/dL 6.7 6.6 6.9  Total Bilirubin 0.3 - 1.2 mg/dL 0.7 0.4 0.5  Alkaline Phos 38 - 126 U/L 48 49 57  AST 15 - 41 U/L 21 20 16   ALT 0 - 44 U/L 21 16 13        DIAGNOSTIC IMAGING:  I have reviewed scans.   ASSESSMENT  & PLAN:   Tonsillar cancer ([place]) 1.  T2N1 squamous cell carcinoma left tonsil, p16 positive: -PET scan on 03/27/2019 shows uptake in the left tonsillar fossa and 1.5 cm lymph node in the left level 2.  No other metastatic disease. -Chemoradiation therapy with weekly cisplatin started on 05/08/2019. -Week 3 of treatment on 05/22/2019. -We reviewed CBC today.  White count is low at 2.9.  Absolute neutrophil count is 1900.  Platelet count is 130. -LFTs are normal.  We will proceed with his next treatment today. -I will reevaluate him in 1 week.  2.  Nutrition: -He lost 7 pounds in the last 1 week. -I have told him to increase his boost to 7 cans/day or boost plus to 5 cans/day.  He is not eating any solid foods because of lack of appetite.  3.  Prostate cancer: -Gleason 9 prostate cancer, diagnosed in normal 2013, status post XRT from April 2014 through May 2014 with 2 years of Lupron.  He has periodic PSAs checked and is continuing to be in remission.      Orders placed this encounter:  No orders of the defined types were placed in this encounter.     Derek Jack, MD [place] [phone]

## 2019-05-29 NOTE — Assessment & Plan Note (Addendum)
1.  T2N1 squamous cell carcinoma left tonsil, p16 positive: -PET scan on 03/27/2019 shows uptake in the left tonsillar fossa and 1.5 cm lymph node in the left level 2.  No other metastatic disease. -Chemoradiation therapy with weekly cisplatin started on 05/08/2019. -Week 3 of treatment on 05/22/2019. -We reviewed CBC today.  White count is low at 2.9.  Absolute neutrophil count is 1900.  Platelet count is 130. -LFTs are normal.  We will proceed with his next treatment today. -I will reevaluate him in 1 week.  2.  Nutrition: -He lost 7 pounds in the last 1 week. -I have told him to increase his boost to 7 cans/day or boost plus to 5 cans/day.  He is not eating any solid foods because of lack of appetite.  3.  Prostate cancer: -Gleason 9 prostate cancer, diagnosed in normal 2013, status post XRT from April 2014 through May 2014 with 2 years of Lupron.  He has periodic PSAs checked and is continuing to be in remission.

## 2019-05-29 NOTE — Progress Notes (Signed)
Patient has been assessed, vital signs and labs have been reviewed by Dr. Katragadda. ANC, Creatinine, LFTs, and Platelets are within treatment parameters per Dr. Katragadda. The patient is good to proceed with treatment at this time.  

## 2019-05-30 DIAGNOSIS — Z510 Encounter for antineoplastic radiation therapy: Secondary | ICD-10-CM | POA: Diagnosis not present

## 2019-05-30 DIAGNOSIS — C090 Malignant neoplasm of tonsillar fossa: Secondary | ICD-10-CM | POA: Diagnosis not present

## 2019-05-30 NOTE — Progress Notes (Signed)

## 2019-05-31 DIAGNOSIS — Z510 Encounter for antineoplastic radiation therapy: Secondary | ICD-10-CM | POA: Diagnosis not present

## 2019-05-31 DIAGNOSIS — C090 Malignant neoplasm of tonsillar fossa: Secondary | ICD-10-CM | POA: Diagnosis not present

## 2019-06-01 DIAGNOSIS — Z510 Encounter for antineoplastic radiation therapy: Secondary | ICD-10-CM | POA: Diagnosis not present

## 2019-06-01 DIAGNOSIS — C090 Malignant neoplasm of tonsillar fossa: Secondary | ICD-10-CM | POA: Diagnosis not present

## 2019-06-02 ENCOUNTER — Other Ambulatory Visit (HOSPITAL_COMMUNITY): Payer: Self-pay | Admitting: Hematology

## 2019-06-02 DIAGNOSIS — Z510 Encounter for antineoplastic radiation therapy: Secondary | ICD-10-CM | POA: Diagnosis not present

## 2019-06-02 DIAGNOSIS — Z95828 Presence of other vascular implants and grafts: Secondary | ICD-10-CM

## 2019-06-02 DIAGNOSIS — C090 Malignant neoplasm of tonsillar fossa: Secondary | ICD-10-CM | POA: Diagnosis not present

## 2019-06-02 DIAGNOSIS — C099 Malignant neoplasm of tonsil, unspecified: Secondary | ICD-10-CM

## 2019-06-05 ENCOUNTER — Inpatient Hospital Stay (HOSPITAL_COMMUNITY): Payer: Medicare Other

## 2019-06-05 ENCOUNTER — Encounter (HOSPITAL_COMMUNITY): Payer: Self-pay | Admitting: Hematology

## 2019-06-05 ENCOUNTER — Other Ambulatory Visit: Payer: Self-pay

## 2019-06-05 ENCOUNTER — Inpatient Hospital Stay (HOSPITAL_COMMUNITY): Payer: Medicare Other | Admitting: Hematology

## 2019-06-05 DIAGNOSIS — Z8249 Family history of ischemic heart disease and other diseases of the circulatory system: Secondary | ICD-10-CM | POA: Diagnosis not present

## 2019-06-05 DIAGNOSIS — Z9221 Personal history of antineoplastic chemotherapy: Secondary | ICD-10-CM | POA: Diagnosis not present

## 2019-06-05 DIAGNOSIS — Z923 Personal history of irradiation: Secondary | ICD-10-CM | POA: Diagnosis not present

## 2019-06-05 DIAGNOSIS — C099 Malignant neoplasm of tonsil, unspecified: Secondary | ICD-10-CM

## 2019-06-05 DIAGNOSIS — C770 Secondary and unspecified malignant neoplasm of lymph nodes of head, face and neck: Secondary | ICD-10-CM | POA: Diagnosis not present

## 2019-06-05 DIAGNOSIS — E785 Hyperlipidemia, unspecified: Secondary | ICD-10-CM | POA: Diagnosis not present

## 2019-06-05 DIAGNOSIS — Z79899 Other long term (current) drug therapy: Secondary | ICD-10-CM | POA: Diagnosis not present

## 2019-06-05 DIAGNOSIS — Z800 Family history of malignant neoplasm of digestive organs: Secondary | ICD-10-CM | POA: Diagnosis not present

## 2019-06-05 DIAGNOSIS — J449 Chronic obstructive pulmonary disease, unspecified: Secondary | ICD-10-CM | POA: Diagnosis not present

## 2019-06-05 DIAGNOSIS — Z510 Encounter for antineoplastic radiation therapy: Secondary | ICD-10-CM | POA: Diagnosis not present

## 2019-06-05 DIAGNOSIS — G2581 Restless legs syndrome: Secondary | ICD-10-CM | POA: Diagnosis not present

## 2019-06-05 DIAGNOSIS — Z801 Family history of malignant neoplasm of trachea, bronchus and lung: Secondary | ICD-10-CM | POA: Diagnosis not present

## 2019-06-05 DIAGNOSIS — Z5189 Encounter for other specified aftercare: Secondary | ICD-10-CM | POA: Diagnosis not present

## 2019-06-05 DIAGNOSIS — Z7982 Long term (current) use of aspirin: Secondary | ICD-10-CM | POA: Diagnosis not present

## 2019-06-05 DIAGNOSIS — Z833 Family history of diabetes mellitus: Secondary | ICD-10-CM | POA: Diagnosis not present

## 2019-06-05 DIAGNOSIS — Z5111 Encounter for antineoplastic chemotherapy: Secondary | ICD-10-CM | POA: Diagnosis not present

## 2019-06-05 DIAGNOSIS — C090 Malignant neoplasm of tonsillar fossa: Secondary | ICD-10-CM | POA: Diagnosis not present

## 2019-06-05 LAB — CBC WITH DIFFERENTIAL/PLATELET
Abs Immature Granulocytes: 0.01 10*3/uL (ref 0.00–0.07)
Basophils Absolute: 0 10*3/uL (ref 0.0–0.1)
Basophils Relative: 1 %
Eosinophils Absolute: 0 10*3/uL (ref 0.0–0.5)
Eosinophils Relative: 1 %
HCT: 33.6 % — ABNORMAL LOW (ref 39.0–52.0)
Hemoglobin: 10.8 g/dL — ABNORMAL LOW (ref 13.0–17.0)
Immature Granulocytes: 1 %
Lymphocytes Relative: 28 %
Lymphs Abs: 0.5 10*3/uL — ABNORMAL LOW (ref 0.7–4.0)
MCH: 32.5 pg (ref 26.0–34.0)
MCHC: 32.1 g/dL (ref 30.0–36.0)
MCV: 101.2 fL — ABNORMAL HIGH (ref 80.0–100.0)
Monocytes Absolute: 0.3 10*3/uL (ref 0.1–1.0)
Monocytes Relative: 13 %
Neutro Abs: 1.1 10*3/uL — ABNORMAL LOW (ref 1.7–7.7)
Neutrophils Relative %: 56 %
Platelets: 66 10*3/uL — ABNORMAL LOW (ref 150–400)
RBC: 3.32 MIL/uL — ABNORMAL LOW (ref 4.22–5.81)
RDW: 13.9 % (ref 11.5–15.5)
WBC: 1.9 10*3/uL — ABNORMAL LOW (ref 4.0–10.5)
nRBC: 0 % (ref 0.0–0.2)

## 2019-06-05 LAB — COMPREHENSIVE METABOLIC PANEL
ALT: 24 U/L (ref 0–44)
AST: 25 U/L (ref 15–41)
Albumin: 3.8 g/dL (ref 3.5–5.0)
Alkaline Phosphatase: 49 U/L (ref 38–126)
Anion gap: 9 (ref 5–15)
BUN: 15 mg/dL (ref 8–23)
CO2: 24 mmol/L (ref 22–32)
Calcium: 9.3 mg/dL (ref 8.9–10.3)
Chloride: 104 mmol/L (ref 98–111)
Creatinine, Ser: 0.88 mg/dL (ref 0.61–1.24)
GFR calc Af Amer: >60 mL/min (ref >60)
GFR calc non Af Amer: >60 mL/min (ref >60)
Glucose, Bld: 98 mg/dL (ref 70–99)
Potassium: 4 mmol/L (ref 3.5–5.1)
Sodium: 137 mmol/L (ref 135–145)
Total Bilirubin: 0.5 mg/dL (ref 0.3–1.2)
Total Protein: 6.4 g/dL — ABNORMAL LOW (ref 6.5–8.1)

## 2019-06-05 LAB — MAGNESIUM: Magnesium: 1.9 mg/dL (ref 1.7–2.4)

## 2019-06-05 MED ORDER — HEPARIN SOD (PORK) LOCK FLUSH 100 UNIT/ML IV SOLN
500.0000 [IU] | Freq: Once | INTRAVENOUS | Status: AC
  Administered 2019-06-05: 500 [IU] via INTRAVENOUS

## 2019-06-05 NOTE — Patient Instructions (Addendum)
[facility] at [facility] Discharge Instructions  You were seen today by Dr. Katragadda. He went over your recent lab results. He will see you back in 1 week for labs, treatment and follow up.   Thank you for choosing [facility] at [facility] to provide your oncology and hematology care.  To afford each patient quality time with our provider, please arrive at least 15 minutes before your scheduled appointment time.   If you have a lab appointment with the Cancer Center please come in thru the  Main Entrance and check in at the main information desk  You need to re-schedule your appointment should you arrive 10 or more minutes late.  We strive to give you quality time with our providers, and arriving late affects you and other patients whose appointments are after yours.  Also, if you no show three or more times for appointments you may be dismissed from the clinic at the providers discretion.     Again, thank you for choosing [facility].  Our hope is that these requests will decrease the amount of time that you wait before being seen by our physicians.       _____________________________________________________________  Should you have questions after your visit to [facility], please contact our office at [phone] between the hours of 8:00 a.m. and 4:30 p.m.  Voicemails left after 4:00 p.m. will not be returned until the following business day.  For prescription refill requests, have your pharmacy contact our office and allow 72 hours.    Cancer Center Support Programs:   > Cancer Support Group  2nd Tuesday of the month 1pm-2pm, Journey Room    

## 2019-06-05 NOTE — Patient Instructions (Signed)
[facility] at [facility]  Discharge Instructions:   _______________________________________________________________  Thank you for choosing [facility] at [facility] to provide your oncology and hematology care.  To afford each patient quality time with our providers, please arrive at least 15 minutes before your scheduled appointment.  You need to re-schedule your appointment if you arrive 10 or more minutes late.  We strive to give you quality time with our providers, and arriving late affects you and other patients whose appointments are after yours.  Also, if you no show three or more times for appointments you may be dismissed from the clinic.  Again, thank you for choosing [facility] at [facility]. Our hope is that these requests will allow you access to exceptional care and in a timely manner. _______________________________________________________________  If you have questions after your visit, please contact our office at [phone] between the hours of 8:30 a.m. and 5:00 p.m. Voicemails left after 4:30 p.m. will not be returned until the following business day. _______________________________________________________________  For prescription refill requests, have your pharmacy contact our office. _______________________________________________________________  Recommendations made by the consultant and any test results will be sent to your referring physician. _______________________________________________________________ 

## 2019-06-05 NOTE — Assessment & Plan Note (Signed)
1.  T2N1 squamous cell carcinoma of the left tonsil, p16 positive: -PET scan on 03/27/2019 shows uptake in the left tonsillar fossa and 1.5 cm lymph node in the left level 2.  No other metastatic disease. -Chemoradiation therapy with weekly cisplatin started on 05/08/2019. -Week 4 of chemotherapy on 05/29/2019. -Does not report any chemotherapy related side effects.  He lost his taste.  Has mild soreness on the left side of the throat. -Physical exam today did not show any palpable lymph node on the left side.  I have reviewed his labs. -His white count low today at 1.9 with ANC of 1100.  Platelet count is also low around 65. -We will hold his chemotherapy.  We will reevaluate him next week.  We will aim for 3 more treatments.  2.  Nutrition: -He has maintained his weight although he did not gain any.  He lost 7 pounds prior to next week. -He started drinking Ensure 350 cal 4 cans/day.  He is also eating soft foods like scrambled eggs, mashed potato, cream of chicken.  He lost his taste sensation.  Does not have any painful swallowing.  3.  Prostate cancer: -Gleason 9 prostate cancer, diagnosed in November 2013, status post XRT from April 2014 through May 2014 with 2 years of Lupron.  He has periodic PSAs checked and is continuing to be in remission.

## 2019-06-05 NOTE — Progress Notes (Signed)
Patient has been assessed, vital signs and labs have been reviewed by Dr. Delton Coombes. Platelet and WBCs low today, HOLD Tx today per Dr. Delton Coombes. He will come back next week as scheduled for labs and Tx.

## 2019-06-05 NOTE — Progress Notes (Signed)
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  CURRENT THERAPY: Chemoradiation therapy.  BRIEF ONCOLOGIC HISTORY:  Oncology History  Tonsillar cancer ([place])  04/07/2019 Initial Diagnosis   Tonsillar cancer ([place])   05/08/2019 -  Chemotherapy   The patient had palonosetron (ALOXI) injection 0.25 mg, 0.25 mg, Intravenous,  Once, 4 of 7 cycles Administration: 0.25 mg (05/08/2019), 0.25 mg (05/15/2019), 0.25 mg (05/22/2019), 0.25 mg (05/29/2019) CISplatin (PLATINOL) 77 mg in sodium chloride 0.9 % 250 mL chemo infusion, 40 mg/m2 = 77 mg, Intravenous,  Once, 4 of 7 cycles Administration: 77 mg (05/08/2019), 77 mg (05/15/2019), 77 mg (05/22/2019), 77 mg (05/29/2019) fosaprepitant (EMEND) 150 mg in sodium chloride 0.9 % 145 mL IVPB, 150 mg, Intravenous,  Once, 4 of 7 cycles Administration: 150 mg (05/08/2019), 150 mg (05/15/2019), 150 mg (05/22/2019), 150 mg (05/29/2019)  for chemotherapy treatment.       CANCER STAGING: Cancer Staging Tonsillar cancer ([place]) Staging form: Pharynx - HPV-Mediated Oropharynx, AJCC 8th Edition - Clinical stage from 04/07/2019: Stage I (cT2, cN1, cM0, p16+) - Unsigned    INTERVAL HISTORY:  Mr. Rutten 69 y.o. male seen for follow-up of tonsillar cancer and toxicity assessment prior to weekly chemotherapy.  Did not experience any nausea, vomiting.  Denies any tingling or numbness in extremities.  Denies any ringing in the ears.  Has been drinking Ensure 4 cans/day.  Reports losing his taste completely.  Some soreness on the left side of the throat when he swallows.  Its not preventing him from eating.  He has not lost any weight from last week.  He is eating certain foods like scrambled eggs, cream of chicken and mashed potatoes.   REVIEW OF SYSTEMS:    Review of Systems  HENT:   Positive for trouble swallowing.   All other systems reviewed and are negative.    PAST MEDICAL/SURGICAL HISTORY:  Past Medical History:  Diagnosis Date  . Allergy   . Anxiety   . Arthritis   . BPH with obstruction/lower urinary tract symptoms   . COPD, mild ([place])    current smoker  . ED (erectile dysfunction)   . GERD (gastroesophageal reflux disease)   . Hyperlipidemia   . Nocturia   . Osteoporosis   . PAD (peripheral artery disease) ([place])   . Port-A-Cath in place 04/28/2019  . Prostate cancer ([place]) 01/11/2012   Adenocarcinoma, Gleason=4+4=8, & 4+5=9,PSA=17.24, Volume= 15.45cc  . PVD (peripheral vascular disease) ([place])   . Restless leg syndrome   . Tongue mass    SCC   Past Surgical History:  Procedure Laterality Date  . ABDOMINAL AORTOGRAM W/LOWER EXTREMITY N/A 08/16/2018   Procedure: ABDOMINAL AORTOGRAM W/LOWER EXTREMITY;  Surgeon: Serafina Mitchell, MD;  Location: [facility];  Service: Cardiovascular;  Laterality: N/A;  Bilateral  . COLONOSCOPY N/A 09/28/2012   Procedure: COLONOSCOPY;  Surgeon: Danie Binder, MD;  [facility];  Service: Endoscopy;  Laterality: N/A;  8:30  . DIRECT LARYNGOSCOPY N/A 04/03/2019   Procedure: DIRECT LARYNGOSCOPY;  Surgeon: Leta Baptist, MD;  Location: [facility];  Service: ENT;  Laterality: N/A;  . ESOPHAGOGASTRODUODENOSCOPY N/A 09/28/2012   Procedure: ESOPHAGOGASTRODUODENOSCOPY (EGD);  Surgeon: Danie Binder, MD;  [facility];  Service: Endoscopy;  Laterality: N/A;  .  Gold seed implatation  04/28/2012  . HERNIA REPAIR     Bilateral inguinal X2  . IR IMAGING GUIDED PORT INSERTION  05/03/2019  . JOINT REPLACEMENT     bilateral  . KNEE SURGERY     Left Knee X 2   and Right knee X1  . MALONEY DILATION N/A 09/28/2012   Procedure: MALONEY DILATION;  Surgeon: Danie Binder, MD;  [facility];  Service: Endoscopy;  Laterality: N/A;  . NOSE SURGERY    . PERIPHERAL  VASCULAR INTERVENTION  08/16/2018   Procedure: PERIPHERAL VASCULAR INTERVENTION;  Surgeon: Serafina Mitchell, MD;  Location: [facility];  Service: Cardiovascular;;  LT Iliac  . PR VEIN BYPASS GRAFT,AORTO-FEM-POP  10/21/10   Left AK to BK popliteal BPG  . PROSTATE BIOPSY  01/11/2012   Adenocarcinoma  . PROSTATE SURGERY  2015   Chemo and  Radiation  . SAVORY DILATION N/A 09/28/2012   Procedure: SAVORY DILATION;  Surgeon: Danie Binder, MD;  [facility];  Service: Endoscopy;  Laterality: N/A;  . throat biopsy  03/20/2019  . TONSILLECTOMY Left 04/03/2019   Procedure: TONSILLECTOMY;  Surgeon: Leta Baptist, MD;  Location: [facility];  Service: ENT;  Laterality: Left;     SOCIAL HISTORY:  Social History   Socioeconomic History  . Marital status: Married    Spouse name: Holley Raring  . Number of children: 2  . Years of education: College  . 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 Smoking status: Current Every Day Smoker    Packs/day: 0.50    Years: 43.00    Pack years: 21.50    Types: Cigarettes  . Smokeless tobacco: Never Used  . Tobacco comment: 1/2 ppd  Substance and Sexual Activity  . Alcohol use: No    Comment: quit 1.5 years ago  . Drug use: No  . Sexual activity: Not Currently  Other Topics Concern  . Not on file  Social History Narrative   Patient lives at home with spouse.   Caffeine use: very little   Social Determinants of Health   Financial Resource Strain: Low Risk   . Difficulty of Paying Living Expenses: Not hard at all  Food Insecurity: No Food Insecurity  . Worried About Charity fundraiser in the Last Year: Never true  . Ran Out of Food in the Last Year: Never true  Transportation Needs: No Transportation Needs  . Lack of Transportation (Medical): No  . Lack of Transportation (Non-Medical): No    Physical Activity: Sufficiently Active  . Days of Exercise per Week: 5 days  . Minutes of Exercise per Session: 30 min  Stress: Stress Concern Present  . Feeling of Stress : Rather much  Social Connections: Slightly Isolated  . Frequency of Communication with Friends and Family: Twice a week  . Frequency of Social Gatherings with Friends and Family: Twice a week  . Attends Religious Services: More than 4 times per year  . Active Member of Clubs or Organizations: No  . Attends Archivist Meetings: Never  . Marital Status: Married  Human resources officer Violence: Not At Risk  . Fear of Current or Ex-Partner: No  . Emotionally Abused: No  . Physically Abused: No  . Sexually Abused: No    FAMILY HISTORY:  Family History  Problem Relation Age of Onset  . Heart disease Mother        Valve regurgitation and Pacemaker   . Diabetes Mother   . Hyperlipidemia Mother   . Heart disease Father        CABG x 5  . Hyperlipidemia Father   . Hypertension Father   . Heart attack Father   . Heart disease Sister        aortic valve replacement  . Cancer Sister 27       Colon cancer w/ metastasis  . Throat cancer Sister   . Hypertension Brother   . Heart disease Maternal Grandmother   . Heart disease Maternal Grandfather   . Heart disease Paternal Grandmother   . Cancer Paternal Grandfather   . Healthy Son   . Healthy Daughter     CURRENT MEDICATIONS:  Outpatient Encounter Medications as of 06/05/2019  Medication Sig Note  . aspirin EC 81 MG tablet Take 81 mg by mouth daily.   . calcium carbonate (OS-CAL) 600 MG tablet Take 1 tablet (600 mg total) by mouth 2 (two) times daily with a meal. (Patient taking differently: Take 600 mg by mouth 2 (two) times daily. )   . Cholecalciferol (VITAMIN D) 50 MCG (2000 UT) tablet Take 2,000 Units by mouth in the morning and at bedtime.   Marland Kitchen CISPLATIN IV Inject into the vein once a week.   . clopidogrel (PLAVIX) 75 MG tablet Take 75 mg by mouth  daily.    . famotidine (PEPCID) 20 MG tablet TAKE 1 TABLET BY MOUTH AT BEDTIME. (Patient taking differently: Take 20 mg by mouth at bedtime. )   . HYDROcodone-acetaminophen (NORCO) 10-325 MG tablet TAKE 1 TABLET BY MOUTH EVERY FOUR HOURS AS NEEDED. (Patient not taking: Reported on 06/05/2019)   . mirtazapine (REMERON) 15 MG tablet Take 1 tablet (15 mg total) by mouth at bedtime.   . pantoprazole (PROTONIX) 40 MG tablet TAKE ONE TABLET BY MOUTH DAILY. (Patient taking differently: Take 40 mg by mouth daily. )   . rOPINIRole (REQUIP) 3 MG tablet TAKE (1) TABLET BY MOUTH AT BEDTIME.   . simvastatin (ZOCOR) 40 MG tablet TAKE (1) TABLET BY MOUTH AT BEDTIME. (Patient taking differently: Take 40 mg by mouth at bedtime. )   . topiramate (TOPAMAX) 25 MG tablet TAKE 3 TABLETS BY MOUTH DAILY. (Patient taking differently: Take 50-75 mg by mouth at bedtime. ) 04/27/2019: If pt is having frequent migraines he'll take 75 mg at night, if not he'll take 50 mg at night  . cyclobenzaprine (FLEXERIL) 5 MG tablet Take 5 mg by mouth at bedtime as needed for muscle spasms.    Marland Kitchen lidocaine-prilocaine (EMLA) cream Apply a pea-sized amount to port a cath site and cover with plastic wrap 1 hour prior to appointment (Patient not taking: Reported on 06/05/2019)   . methocarbamol (ROBAXIN) 500 MG tablet Take 500 mg by mouth every 6 (six) hours as needed for muscle spasms.    . prochlorperazine (COMPAZINE) 10 MG tablet TAKE (1) TABLET BY MOUTH EVERY SIX HOURS AS NEEDED. (Patient not taking: Reported on 06/05/2019)   . [DISCONTINUED] fluconazole (DIFLUCAN) 100 MG tablet TAKE (2) TABLETS TODAYFTHEN TAKE (1) TABLET.DAILY UNTIL GONE.    No facility-administered encounter medications on file as of 06/05/2019.    ALLERGIES:  Allergies  Allergen Reactions  . Codeine Other (See Comments)    Bad headache and sweats  . Morphine And Related Itching    sweats  . Percocet [Oxycodone-Acetaminophen]  Other (See Comments)    Headache and sweats       PHYSICAL EXAM:  ECOG Performance status: 1  Vitals:   06/05/19 0837  BP: 112/69  Pulse: (!) 57  Resp: 18  Temp: (!) 96.9 F (36.1 C)  SpO2: 100%   Filed Weights   06/05/19 0837  Weight: 162 lb 3.2 oz (73.6 kg)    Physical Exam Vitals reviewed.  Constitutional:      Appearance: Normal appearance.  HENT:     Mouth/Throat:     Mouth: Mucous membranes are moist.  Cardiovascular:     Rate and Rhythm: Normal rate and regular rhythm.     Heart sounds: Normal heart sounds.  Pulmonary:     Effort: Pulmonary effort is normal.     Breath sounds: Normal breath sounds.  Abdominal:     General: There is no distension.     Palpations: Abdomen is soft.  Lymphadenopathy:     Cervical: No cervical adenopathy.  Skin:    General: Skin is warm.  Neurological:     General: No focal deficit present.     Mental Status: He is alert and oriented to person, place, and time.  Psychiatric:        Mood and Affect: Mood normal.        Behavior: Behavior normal.    No palpable left cervical lymphadenopathy.  LABORATORY DATA:  I have reviewed the labs as listed.  CBC    Component Value Date/Time   WBC 1.9 (L) 06/05/2019 0832   RBC 3.32 (L) 06/05/2019 0832   HGB 10.8 (L) 06/05/2019 0832   HCT 33.6 (L) 06/05/2019 0832   PLT 66 (L) 06/05/2019 0832   MCV 101.2 (H) 06/05/2019 0832   MCH 32.5 06/05/2019 0832   MCHC 32.1 06/05/2019 0832   RDW 13.9 06/05/2019 0832   LYMPHSABS 0.5 (L) 06/05/2019 0832   MONOABS 0.3 06/05/2019 0832   EOSABS 0.0 06/05/2019 0832   BASOSABS 0.0 06/05/2019 0832   CMP Latest Ref Rng & Units 06/05/2019 05/29/2019 05/22/2019  Glucose 70 - 99 mg/dL 98 99 100(H)  BUN 8 - 23 mg/dL 15 15 13   Creatinine 0.61 - 1.24 mg/dL 0.88 0.88 0.79  Sodium 135 - 145 mmol/L 137 138 139  Potassium 3.5 - 5.1 mmol/L 4.0 4.1 4.1  Chloride 98 - 111 mmol/L 104 105 109  CO2 22 - 32 mmol/L 24 25 22   Calcium 8.9 - 10.3 mg/dL 9.3 9.5 9.0  Total Protein 6.5 - 8.1 g/dL 6.4(L) 6.7 6.6   Total Bilirubin 0.3 - 1.2 mg/dL 0.5 0.7 0.4  Alkaline Phos 38 - 126 U/L 49 48 49  AST 15 - 41 U/L 25 21 20   ALT 0 - 44 U/L 24 21 16        DIAGNOSTIC IMAGING:  I have reviewed scans.   ASSESSMENT & PLAN:   Tonsillar cancer ([place]) 1.  T2N1 squamous cell carcinoma of the left tonsil, p16 positive: -PET scan on 03/27/2019 shows uptake in the left tonsillar fossa and 1.5 cm lymph node in the left level 2.  No other metastatic disease. -Chemoradiation therapy with weekly cisplatin started on 05/08/2019. -Week 4 of chemotherapy on 05/29/2019. -Does not report any chemotherapy related side effects.  He lost his taste.  Has mild soreness on the left side of the throat. -Physical exam today did not show any palpable lymph node on the left side.  I have reviewed his labs. -His white count low today at 1.9 with  ANC of 1100.  Platelet count is also low around 65. -We will hold his chemotherapy.  We will reevaluate him next week.  We will aim for 3 more treatments.  2.  Nutrition: -He has maintained his weight although he did not gain any.  He lost 7 pounds prior to next week. -He started drinking Ensure 350 cal 4 cans/day.  He is also eating soft foods like scrambled eggs, mashed potato, cream of chicken.  He lost his taste sensation.  Does not have any painful swallowing.  3.  Prostate cancer: -Gleason 9 prostate cancer, diagnosed in November 2013, status post XRT from April 2014 through May 2014 with 2 years of Lupron.  He has periodic PSAs checked and is continuing to be in remission.      Orders placed this encounter:  No orders of the defined types were placed in this encounter.     Derek Jack, MD [place] [phone]

## 2019-06-05 NOTE — Progress Notes (Signed)
Patient presents today for treatment and follow up visit with Dr. Delton Coombes. Labs pending. Vital signs within parameters for treatment. Patient has no complaints of any changes since the last visit. Patient states the thrush in his mouth is gone and he is no longer using the Magic Mouthwash.  Message received from [facility] LPN/ Dr. Delton Coombes. HOLD treatment today. Platelets 66, WBC 1.9, ANC 1.1. Patient scheduled to return next week for labs and treatment per note. No complaints at this time. Discharged from clinic ambulatory. F/U with [facility] as scheduled.

## 2019-06-06 DIAGNOSIS — Z510 Encounter for antineoplastic radiation therapy: Secondary | ICD-10-CM | POA: Diagnosis not present

## 2019-06-06 DIAGNOSIS — C090 Malignant neoplasm of tonsillar fossa: Secondary | ICD-10-CM | POA: Diagnosis not present

## 2019-06-07 DIAGNOSIS — C090 Malignant neoplasm of tonsillar fossa: Secondary | ICD-10-CM | POA: Diagnosis not present

## 2019-06-07 DIAGNOSIS — Z510 Encounter for antineoplastic radiation therapy: Secondary | ICD-10-CM | POA: Diagnosis not present

## 2019-06-07 NOTE — Progress Notes (Signed)

## 2019-06-08 DIAGNOSIS — C090 Malignant neoplasm of tonsillar fossa: Secondary | ICD-10-CM | POA: Diagnosis not present

## 2019-06-08 DIAGNOSIS — Z510 Encounter for antineoplastic radiation therapy: Secondary | ICD-10-CM | POA: Diagnosis not present

## 2019-06-09 ENCOUNTER — Ambulatory Visit (HOSPITAL_COMMUNITY): Payer: Medicare Other

## 2019-06-09 DIAGNOSIS — Z510 Encounter for antineoplastic radiation therapy: Secondary | ICD-10-CM | POA: Diagnosis not present

## 2019-06-09 DIAGNOSIS — C090 Malignant neoplasm of tonsillar fossa: Secondary | ICD-10-CM | POA: Diagnosis not present

## 2019-06-09 NOTE — Progress Notes (Signed)
Nutrition Follow-up:  Patient with left tonsil squamous cell carcinoma with metastatic diseaes in lymph node, p 16 positive.  Patient receiving concurrent chemotherapy and radiation therapy.    Called patient for nutrition follow-up.  Patient current at [facility] with father.  Reports things are not looking good for father.  Has only been drinking 3 ensure enlive daily due to dad's illness and being at the hospital.  Reports that he has not been feeling good this week.  Trying to eat mashed potatoes, eggs, broth, milkshakes.  Reports soreness when swallowing. Patient reports when looks at food feels sick or that he may vomit.  Has not taken nausea medications    Medications: reviewed  Labs: reviewed  Anthropometrics:   Weight 162 lb 3.2 oz on 4/12 Decreased from 170 lb on 3/22 172 lb on 3/10   NUTRITION DIAGNOSIS: Predicted suboptimal energy intake continues   INTERVENTION:  6 ensure enlive would better meet patient nutritional needs if able to drink orally.  Will provide another case of ensure for patient to pick up on Monday, 4/19 at next treatment Reviewed ways to increase calories and protein. Encouraged patient to take nausea medication when feeling sick when looking at food.   Patient has contact information     MONITORING, EVALUATION, GOAL: Patient will consume adequate calories and protein to prevent weight loss during treatment   NEXT VISIT: April 23 phone f/u  Macaela Presas B. Zenia Resides, [place], [employer] Registered Dietitian [phone] (pager)

## 2019-06-12 ENCOUNTER — Other Ambulatory Visit: Payer: Self-pay

## 2019-06-12 ENCOUNTER — Inpatient Hospital Stay (HOSPITAL_COMMUNITY): Payer: Medicare Other

## 2019-06-12 ENCOUNTER — Inpatient Hospital Stay (HOSPITAL_COMMUNITY): Payer: Medicare Other | Admitting: Hematology

## 2019-06-12 DIAGNOSIS — Z7982 Long term (current) use of aspirin: Secondary | ICD-10-CM | POA: Diagnosis not present

## 2019-06-12 DIAGNOSIS — Z923 Personal history of irradiation: Secondary | ICD-10-CM | POA: Diagnosis not present

## 2019-06-12 DIAGNOSIS — Z5111 Encounter for antineoplastic chemotherapy: Secondary | ICD-10-CM | POA: Diagnosis not present

## 2019-06-12 DIAGNOSIS — C090 Malignant neoplasm of tonsillar fossa: Secondary | ICD-10-CM | POA: Diagnosis not present

## 2019-06-12 DIAGNOSIS — C770 Secondary and unspecified malignant neoplasm of lymph nodes of head, face and neck: Secondary | ICD-10-CM | POA: Diagnosis not present

## 2019-06-12 DIAGNOSIS — C099 Malignant neoplasm of tonsil, unspecified: Secondary | ICD-10-CM

## 2019-06-12 DIAGNOSIS — Z79899 Other long term (current) drug therapy: Secondary | ICD-10-CM | POA: Diagnosis not present

## 2019-06-12 DIAGNOSIS — Z510 Encounter for antineoplastic radiation therapy: Secondary | ICD-10-CM | POA: Diagnosis not present

## 2019-06-12 DIAGNOSIS — Z833 Family history of diabetes mellitus: Secondary | ICD-10-CM | POA: Diagnosis not present

## 2019-06-12 DIAGNOSIS — J449 Chronic obstructive pulmonary disease, unspecified: Secondary | ICD-10-CM | POA: Diagnosis not present

## 2019-06-12 DIAGNOSIS — E785 Hyperlipidemia, unspecified: Secondary | ICD-10-CM | POA: Diagnosis not present

## 2019-06-12 DIAGNOSIS — Z800 Family history of malignant neoplasm of digestive organs: Secondary | ICD-10-CM | POA: Diagnosis not present

## 2019-06-12 DIAGNOSIS — Z9221 Personal history of antineoplastic chemotherapy: Secondary | ICD-10-CM | POA: Diagnosis not present

## 2019-06-12 DIAGNOSIS — Z801 Family history of malignant neoplasm of trachea, bronchus and lung: Secondary | ICD-10-CM | POA: Diagnosis not present

## 2019-06-12 DIAGNOSIS — G2581 Restless legs syndrome: Secondary | ICD-10-CM | POA: Diagnosis not present

## 2019-06-12 DIAGNOSIS — Z5189 Encounter for other specified aftercare: Secondary | ICD-10-CM | POA: Diagnosis not present

## 2019-06-12 DIAGNOSIS — Z8249 Family history of ischemic heart disease and other diseases of the circulatory system: Secondary | ICD-10-CM | POA: Diagnosis not present

## 2019-06-12 LAB — COMPREHENSIVE METABOLIC PANEL
ALT: 17 U/L (ref 0–44)
AST: 22 U/L (ref 15–41)
Albumin: 3.8 g/dL (ref 3.5–5.0)
Alkaline Phosphatase: 48 U/L (ref 38–126)
Anion gap: 10 (ref 5–15)
BUN: 15 mg/dL (ref 8–23)
CO2: 25 mmol/L (ref 22–32)
Calcium: 9.5 mg/dL (ref 8.9–10.3)
Chloride: 103 mmol/L (ref 98–111)
Creatinine, Ser: 0.94 mg/dL (ref 0.61–1.24)
GFR calc Af Amer: >60 mL/min (ref >60)
GFR calc non Af Amer: >60 mL/min (ref >60)
Glucose, Bld: 119 mg/dL — ABNORMAL HIGH (ref 70–99)
Potassium: 4.2 mmol/L (ref 3.5–5.1)
Sodium: 138 mmol/L (ref 135–145)
Total Bilirubin: 0.5 mg/dL (ref 0.3–1.2)
Total Protein: 6.5 g/dL (ref 6.5–8.1)

## 2019-06-12 LAB — CBC WITH DIFFERENTIAL/PLATELET
Abs Immature Granulocytes: 0 10*3/uL (ref 0.00–0.07)
Basophils Absolute: 0 10*3/uL (ref 0.0–0.1)
Basophils Relative: 0 %
Eosinophils Absolute: 0 10*3/uL (ref 0.0–0.5)
Eosinophils Relative: 1 %
HCT: 33 % — ABNORMAL LOW (ref 39.0–52.0)
Hemoglobin: 10.5 g/dL — ABNORMAL LOW (ref 13.0–17.0)
Immature Granulocytes: 0 %
Lymphocytes Relative: 27 %
Lymphs Abs: 0.5 10*3/uL — ABNORMAL LOW (ref 0.7–4.0)
MCH: 32.4 pg (ref 26.0–34.0)
MCHC: 31.8 g/dL (ref 30.0–36.0)
MCV: 101.9 fL — ABNORMAL HIGH (ref 80.0–100.0)
Monocytes Absolute: 0.2 10*3/uL (ref 0.1–1.0)
Monocytes Relative: 9 %
Neutro Abs: 1.1 10*3/uL — ABNORMAL LOW (ref 1.7–7.7)
Neutrophils Relative %: 63 %
Platelets: 65 10*3/uL — ABNORMAL LOW (ref 150–400)
RBC: 3.24 MIL/uL — ABNORMAL LOW (ref 4.22–5.81)
RDW: 14.4 % (ref 11.5–15.5)
WBC: 1.7 10*3/uL — ABNORMAL LOW (ref 4.0–10.5)
nRBC: 0 % (ref 0.0–0.2)

## 2019-06-12 LAB — MAGNESIUM: Magnesium: 2 mg/dL (ref 1.7–2.4)

## 2019-06-12 NOTE — Progress Notes (Signed)
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  CURRENT THERAPY: Chemoradiation therapy.  BRIEF ONCOLOGIC HISTORY:  Oncology History  Tonsillar cancer ([place])  04/07/2019 Initial Diagnosis   Tonsillar cancer ([place])   05/08/2019 -  Chemotherapy   The patient had palonosetron (ALOXI) injection 0.25 mg, 0.25 mg, Intravenous,  Once, 4 of 7 cycles Administration: 0.25 mg (05/08/2019), 0.25 mg (05/15/2019), 0.25 mg (05/22/2019), 0.25 mg (05/29/2019) CISplatin (PLATINOL) 77 mg in sodium chloride 0.9 % 250 mL chemo infusion, 40 mg/m2 = 77 mg, Intravenous,  Once, 4 of 7 cycles Administration: 77 mg (05/08/2019), 77 mg (05/15/2019), 77 mg (05/22/2019), 77 mg (05/29/2019) fosaprepitant (EMEND) 150 mg in sodium chloride 0.9 % 145 mL IVPB, 150 mg, Intravenous,  Once, 4 of 7 cycles Administration: 150 mg (05/08/2019), 150 mg (05/15/2019), 150 mg (05/22/2019), 150 mg (05/29/2019)  for chemotherapy treatment.       CANCER STAGING: Cancer Staging Tonsillar cancer ([place]) Staging form: Pharynx - HPV-Mediated Oropharynx, AJCC 8th Edition - Clinical stage from 04/07/2019: Stage I (cT2, cN1, cM0, p16+) - Unsigned    INTERVAL HISTORY:  [name] 69 y.o. male seen for follow-up of tonsillar cancer and toxicity assessment prior to chemotherapy.  He has not received chemo last week because of neutropenia.  He completed radiation.  He reports no appetite and no energy levels.  He lost 3 to 4 pounds since last visit.  Reports some pain on the left side of the neck and throat.  No major pain on swallowing solids or liquids.  He is eating soft foods.  He is drinking 4 cans of Ensure 350.  He has occasional nausea.  Denies any tingling or numbness in the extremities.  No hearing loss.   REVIEW OF SYSTEMS:   Review of Systems  Constitutional: Positive for fatigue.  HENT:   Positive for trouble swallowing.   Gastrointestinal: Positive for nausea.  All other systems reviewed and are negative.    PAST MEDICAL/SURGICAL HISTORY:  Past Medical History:  Diagnosis Date  . Allergy   . Anxiety   . Arthritis   . BPH with obstruction/lower urinary tract symptoms   . COPD, mild ([place])    current smoker  . ED (erectile dysfunction)   . GERD (gastroesophageal reflux disease)   . Hyperlipidemia   . Nocturia   . Osteoporosis   . PAD (peripheral artery disease) ([place])   . Port-A-Cath in place 04/28/2019  . Prostate cancer ([place]) 01/11/2012   Adenocarcinoma, Gleason=4+4=8, & 4+5=9,PSA=17.24, Volume= 15.45cc  . PVD (peripheral vascular disease) ([place])   . Restless leg syndrome   . Tongue mass    SCC   Past Surgical History:  Procedure Laterality Date  . ABDOMINAL AORTOGRAM W/LOWER EXTREMITY N/A 08/16/2018   Procedure: ABDOMINAL AORTOGRAM W/LOWER EXTREMITY;  Surgeon: Serafina Mitchell, MD;  Location: [facility];  Service: Cardiovascular;  Laterality: N/A;  Bilateral  . COLONOSCOPY N/A 09/28/2012   Procedure: COLONOSCOPY;  Surgeon: Danie Binder, MD;  [facility];  Service: Endoscopy;  Laterality: N/A;  8:30  . DIRECT LARYNGOSCOPY N/A 04/03/2019   Procedure: DIRECT LARYNGOSCOPY;  Surgeon: Leta Baptist, MD;  Location: [facility];  Service: ENT;  Laterality: N/A;  . ESOPHAGOGASTRODUODENOSCOPY N/A 09/28/2012   Procedure: ESOPHAGOGASTRODUODENOSCOPY (EGD);  Surgeon: Danie Binder, MD;  [facility];  Service: Endoscopy;  Laterality: N/A;  . Gold seed implatation  04/28/2012  . HERNIA REPAIR     Bilateral inguinal X2  . IR IMAGING GUIDED PORT INSERTION  05/03/2019  . JOINT REPLACEMENT     bilateral  . KNEE SURGERY     Left Knee X 2   and Right knee X1  . MALONEY DILATION N/A 09/28/2012   Procedure: MALONEY DILATION;  Surgeon: Danie Binder, MD;  [facility];  Service: Endoscopy;  Laterality: N/A;  . NOSE SURGERY    . PERIPHERAL VASCULAR INTERVENTION  08/16/2018   Procedure: PERIPHERAL VASCULAR INTERVENTION;  Surgeon: Serafina Mitchell, MD;  Location: [facility];  Service: Cardiovascular;;  LT Iliac  . PR VEIN BYPASS GRAFT,AORTO-FEM-POP  10/21/10   Left AK to BK popliteal BPG  . PROSTATE BIOPSY  01/11/2012   Adenocarcinoma  . PROSTATE SURGERY  2015   Chemo and  Radiation  . SAVORY DILATION N/A 09/28/2012   Procedure: SAVORY DILATION;  Surgeon: Danie Binder, MD;  [facility];  Service: Endoscopy;  Laterality: N/A;  . throat biopsy  03/20/2019  . TONSILLECTOMY Left 04/03/2019   Procedure: TONSILLECTOMY;  Surgeon: Leta Baptist, MD;  Location: [facility];  Service: ENT;  Laterality: Left;     SOCIAL HISTORY:  Social History   Socioeconomic History  . Marital status: Married    Spouse name: Holley Raring  . Number of children: 2  . Years of education: College  . 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 Smoking status: Current Every Day Smoker    Packs/day: 0.50    Years: 43.00    Pack years: 21.50    Types: Cigarettes  . Smokeless tobacco: Never Used  . Tobacco comment: 1/2 ppd  Substance and Sexual Activity  . Alcohol use: No    Comment: quit 1.5 years ago  . Drug use: No  . Sexual activity: Not Currently  Other Topics Concern  . Not on file  Social History Narrative   Patient lives at home with spouse.   Caffeine use: very little   Social Determinants of Health   Financial Resource Strain: Low Risk   . Difficulty of Paying Living Expenses: Not hard at all  Food Insecurity: No Food Insecurity  . Worried About Charity fundraiser in the Last Year: Never true  . Ran Out of Food in the Last Year: Never true  Transportation Needs: No Transportation Needs  . Lack of  Transportation (Medical): No  . Lack of Transportation (Non-Medical): No  Physical Activity: Sufficiently Active  . Days of Exercise per Week: 5 days  . Minutes of Exercise per Session: 30 min  Stress: Stress Concern Present  . Feeling of Stress : Rather much  Social Connections: Slightly Isolated  . Frequency of Communication with Friends and Family: Twice a week  . Frequency of Social Gatherings with Friends and Family: Twice a week  . Attends Religious Services: More than 4 times per year  . Active Member of Clubs or Organizations: No  . Attends Archivist Meetings: Never  . Marital Status: Married  Human resources officer Violence: Not At Risk  . Fear of Current or Ex-Partner: No  . Emotionally Abused: No  .  Physically Abused: No  . Sexually Abused: No    FAMILY HISTORY:  Family History  Problem Relation Age of Onset  . Heart disease Mother        Valve regurgitation and Pacemaker   . Diabetes Mother   . Hyperlipidemia Mother   . Heart disease Father        CABG x 5  . Hyperlipidemia Father   . Hypertension Father   . Heart attack Father   . Heart disease Sister        aortic valve replacement  . Cancer Sister 35       Colon cancer w/ metastasis  . Throat cancer Sister   . Hypertension Brother   . Heart disease Maternal Grandmother   . Heart disease Maternal Grandfather   . Heart disease Paternal Grandmother   . Cancer Paternal Grandfather   . Healthy Son   . Healthy Daughter     CURRENT MEDICATIONS:  Outpatient Encounter Medications as of 06/12/2019  Medication Sig Note  . HYDROcodone-acetaminophen (NORCO) 10-325 MG tablet TAKE 1 TABLET BY MOUTH EVERY FOUR HOURS AS NEEDED. (Patient not taking: Reported on 06/05/2019)   . aspirin EC 81 MG tablet Take 81 mg by mouth daily.   . calcium carbonate (OS-CAL) 600 MG tablet Take 1 tablet (600 mg total) by mouth 2 (two) times daily with a meal. (Patient taking differently: Take 600 mg by mouth 2 (two) times daily. )    . Cholecalciferol (VITAMIN D) 50 MCG (2000 UT) tablet Take 2,000 Units by mouth in the morning and at bedtime.   Marland Kitchen CISPLATIN IV Inject into the vein once a week.   . clopidogrel (PLAVIX) 75 MG tablet Take 75 mg by mouth daily.    . cyclobenzaprine (FLEXERIL) 5 MG tablet Take 5 mg by mouth at bedtime as needed for muscle spasms.    . famotidine (PEPCID) 20 MG tablet TAKE 1 TABLET BY MOUTH AT BEDTIME. (Patient taking differently: Take 20 mg by mouth at bedtime. )   . lidocaine-prilocaine (EMLA) cream Apply a pea-sized amount to port a cath site and cover with plastic wrap 1 hour prior to appointment (Patient not taking: Reported on 06/05/2019)   . methocarbamol (ROBAXIN) 500 MG tablet Take 500 mg by mouth every 6 (six) hours as needed for muscle spasms.    . mirtazapine (REMERON) 15 MG tablet Take 1 tablet (15 mg total) by mouth at bedtime.   . pantoprazole (PROTONIX) 40 MG tablet TAKE ONE TABLET BY MOUTH DAILY. (Patient taking differently: Take 40 mg by mouth daily. )   . prochlorperazine (COMPAZINE) 10 MG tablet TAKE (1) TABLET BY MOUTH EVERY SIX HOURS AS NEEDED. (Patient not taking: Reported on 06/05/2019)   . rOPINIRole (REQUIP) 3 MG tablet TAKE (1) TABLET BY MOUTH AT BEDTIME.   . simvastatin (ZOCOR) 40 MG tablet TAKE (1) TABLET BY MOUTH AT BEDTIME. (Patient taking differently: Take 40 mg by mouth at bedtime. )   . topiramate (TOPAMAX) 25 MG tablet TAKE 3 TABLETS BY MOUTH DAILY. (Patient taking differently: Take 50-75 mg by mouth at bedtime. ) 04/27/2019: If pt is having frequent migraines he'll take 75 mg at night, if not he'll take 50 mg at night   No facility-administered encounter medications on file as of 06/12/2019.    ALLERGIES:  Allergies  Allergen Reactions  . Codeine Other (See Comments)    Bad headache and sweats  . Morphine And Related Itching    sweats  . Percocet [Oxycodone-Acetaminophen] Other (  See Comments)    Headache and sweats     PHYSICAL EXAM:  ECOG Performance  status: 1  Vitals:   06/12/19 0834  BP: 113/71  Pulse: 63  Resp: 18  Temp: (!) 97.3 F (36.3 C)  SpO2: 100%   Filed Weights   06/12/19 0834  Weight: 158 lb 9.6 oz (71.9 kg)    Physical Exam Vitals reviewed.  Constitutional:      Appearance: Normal appearance.  HENT:     Mouth/Throat:     Mouth: Mucous membranes are moist.  Cardiovascular:     Rate and Rhythm: Normal rate and regular rhythm.     Heart sounds: Normal heart sounds.  Pulmonary:     Effort: Pulmonary effort is normal.     Breath sounds: Normal breath sounds.  Abdominal:     General: There is no distension.     Palpations: Abdomen is soft.  Lymphadenopathy:     Cervical: No cervical adenopathy.  Skin:    General: Skin is warm.  Neurological:     General: No focal deficit present.     Mental Status: He is alert and oriented to person, place, and time.  Psychiatric:        Mood and Affect: Mood normal.        Behavior: Behavior normal.    Left cervical lymph node is no longer palpable.  LABORATORY DATA:  I have reviewed the labs as listed.  CBC    Component Value Date/Time   WBC 1.7 (L) 06/12/2019 0841   RBC 3.24 (L) 06/12/2019 0841   HGB 10.5 (L) 06/12/2019 0841   HCT 33.0 (L) 06/12/2019 0841   PLT 65 (L) 06/12/2019 0841   MCV 101.9 (H) 06/12/2019 0841   MCH 32.4 06/12/2019 0841   MCHC 31.8 06/12/2019 0841   RDW 14.4 06/12/2019 0841   LYMPHSABS 0.5 (L) 06/12/2019 0841   MONOABS 0.2 06/12/2019 0841   EOSABS 0.0 06/12/2019 0841   BASOSABS 0.0 06/12/2019 0841   CMP Latest Ref Rng & Units 06/12/2019 06/05/2019 05/29/2019  Glucose 70 - 99 mg/dL 119(H) 98 99  BUN 8 - 23 mg/dL 15 15 15   Creatinine 0.61 - 1.24 mg/dL 0.94 0.88 0.88  Sodium 135 - 145 mmol/L 138 137 138  Potassium 3.5 - 5.1 mmol/L 4.2 4.0 4.1  Chloride 98 - 111 mmol/L 103 104 105  CO2 22 - 32 mmol/L 25 24 25   Calcium 8.9 - 10.3 mg/dL 9.5 9.3 9.5  Total Protein 6.5 - 8.1 g/dL 6.5 6.4(L) 6.7  Total Bilirubin 0.3 - 1.2 mg/dL 0.5 0.5  0.7  Alkaline Phos 38 - 126 U/L 48 49 48  AST 15 - 41 U/L 22 25 21   ALT 0 - 44 U/L 17 24 21        DIAGNOSTIC IMAGING:  I have reviewed scans.   ASSESSMENT & PLAN:   Tonsillar cancer ([place]) 1.  T2N1 squamous cell carcinoma left tonsil, p16 positive: -PET scan on 03/27/2019 shows uptake in the left tonsillar fossa, 1.5 cm lymph node at the left level 2, no other metastatic disease. -Chemoradiation therapy with weekly cisplatin started on 05/08/2019.  Week 4 of chemotherapy on 05/29/2019. -His chemotherapy was held last week secondary to neutropenia. -He had a rough last week.  His sister in [place] died.  His father had a PE and was hospitalized.  He is depressed as result of it. -I have reviewed his labs.  White count is 1.7 with ANC of 1.1. -He has already  missed 1 week of treatment.  I have recommended growth factor with G-CSF.  Once his [place] is more than 1500, he will receive his chemotherapy.  We will obtain insurance authorization.  2.  Nutrition: -He is drinking Ensure 354 cans/day.  He lost 4 pounds since last visit.  He is eating soft foods.  No major dysphagia to solids and liquids.  However he is also feeling depressed because of his family events.  3.  Prostate cancer: -Gleason 9 prostate cancer, diagnosed in 2013, status post XRT from April 2014 through May 2014 with 2 years of Lupron.Marland Kitchen  PSAs are normal.      Orders placed this encounter:  No orders of the defined types were placed in this encounter.     Derek Jack, MD [place] [phone]

## 2019-06-12 NOTE — Patient Instructions (Addendum)
[facility] Discharge Instructions for Patients Receiving Chemotherapy   Beginning January 23rd 2017 lab work for the [facility] will be done in the  Main lab at [facility] on 1st floor. If you have a lab appointment with the [facility] please come in thru the  Main Entrance and check in at the main information desk   Today you received the following chemotherapy agents Neupogen  To help prevent nausea and vomiting after your treatment, we encourage you to take your nausea medication   If you develop nausea and vomiting, or diarrhea that is not controlled by your medication, call the clinic.  The clinic phone number is [phone]. Office hours are Monday-Friday 8:30am-5:00pm.  BELOW ARE SYMPTOMS THAT SHOULD BE REPORTED IMMEDIATELY:  *FEVER GREATER THAN 101.0 F  *CHILLS WITH OR WITHOUT FEVER  NAUSEA AND VOMITING THAT IS NOT CONTROLLED WITH YOUR NAUSEA MEDICATION  *UNUSUAL SHORTNESS OF BREATH  *UNUSUAL BRUISING OR BLEEDING  TENDERNESS IN MOUTH AND THROAT WITH OR WITHOUT PRESENCE OF ULCERS  *URINARY PROBLEMS  *BOWEL PROBLEMS  UNUSUAL RASH Items with * indicate a potential emergency and should be followed up as soon as possible. If you have an emergency after office hours please contact your primary care physician or go to the nearest emergency department.  Please call the clinic during office hours if you have any questions or concerns.   You may also contact the Patient Navigator at [phone] should you have any questions or need assistance in obtaining follow up care.      Resources For Cancer Patients and their Caregivers ? American Cancer Society: Can assist with transportation, wigs, general needs, runs Look Good Feel Better.        406-678-0774 ? Cancer Care: Provides financial assistance, online support groups, medication/co-pay assistance.  1-800-813-HOPE 760-343-8627) ? [facility] Assists [place] cancer  patients and their families through emotional , educational and financial support.  229-134-5792 ? [place] DSS Where to apply for food stamps, Medicaid and utility assistance. 661-318-4868 ? [facility]: Transportation to medical appointments. (434)530-0363 ? Social Security Administration: May apply for disability if have a Stage IV cancer. (212)390-9193 (647)515-3164 ? [employer], Disability and Transit Services: Assists with nutrition, care and transit needs. 939-111-1803

## 2019-06-12 NOTE — Patient Instructions (Addendum)
[place] at [facility] Discharge Instructions  You were seen today by Dr. Delton Coombes. He went over your recent lab results. He will give you a shot today to help boost your white blood cells. He will see you back tomorrow for labs, treatment and follow up.   Thank you for choosing [place] at [facility] to provide your oncology and hematology care.  To afford each patient quality time with our provider, please arrive at least 15 minutes before your scheduled appointment time.   If you have a lab appointment with the [facility] please come in thru the  Main Entrance and check in at the main information desk  You need to re-schedule your appointment should you arrive 10 or more minutes late.  We strive to give you quality time with our providers, and arriving late affects you and other patients whose appointments are after yours.  Also, if you no show three or more times for appointments you may be dismissed from the clinic at the providers discretion.     Again, thank you for choosing [facility].  Our hope is that these requests will decrease the amount of time that you wait before being seen by our physicians.       _____________________________________________________________  Should you have questions after your visit to [facility], please contact our office at [phone] between the hours of 8:00 a.m. and 4:30 p.m.  Voicemails left after 4:00 p.m. will not be returned until the following business day.  For prescription refill requests, have your pharmacy contact our office and allow 72 hours.    Cancer Center Support Programs:   > Cancer Support Group  2nd Tuesday of the month 1pm-2pm, Journey Room

## 2019-06-12 NOTE — Assessment & Plan Note (Signed)
1.  T2N1 squamous cell carcinoma left tonsil, p16 positive: -PET scan on 03/27/2019 shows uptake in the left tonsillar fossa, 1.5 cm lymph node at the left level 2, no other metastatic disease. -Chemoradiation therapy with weekly cisplatin started on 05/08/2019.  Week 4 of chemotherapy on 05/29/2019. -His chemotherapy was held last week secondary to neutropenia. -He had a rough last week.  His sister in [place] died.  His father had a PE and was hospitalized.  He is depressed as result of it. -I have reviewed his labs.  White count is 1.7 with ANC of 1.1. -He has already missed 1 week of treatment.  I have recommended growth factor with G-CSF.  Once his [place] is more than 1500, he will receive his chemotherapy.  We will obtain insurance authorization.  2.  Nutrition: -He is drinking Ensure 354 cans/day.  He lost 4 pounds since last visit.  He is eating soft foods.  No major dysphagia to solids and liquids.  However he is also feeling depressed because of his family events.  3.  Prostate cancer: -Gleason 9 prostate cancer, diagnosed in 2013, status post XRT from April 2014 through May 2014 with 2 years of Lupron.Marland Kitchen  PSAs are normal.

## 2019-06-12 NOTE — Progress Notes (Signed)
Cory Foley presents today for D1C5 Cisplatin. He reports that he is starting to have increasing difficulties swallowing, solids and liquids. He has no appetite or taste but has been drinking Ensure and/or milkshakes daily. He has last 4 pounds since last week. He also reports increasing pain and a raw feeling in his throat. Radiation has told him to start using his magic mouthwash again and to continue using his cream on his neck for the radiation burn. He reports being very fatigued and worn down. He has been taking care of his dad who was hospitalized this week for BL PEs and he said his sister passed away unexpectedly on 06/07/22. He states he feels like he hasn't taken the best care of himself this past week. Vital signs are stable, labs are pending.  [id number]- Pt assessed by Dr. Delton Coombes and lab results reviewed. Platelets 65, ANC 1.1. Per MD, hold treatment today and give Zarxio.   [name] still pending for Zarxio. Patient states he has to leave by 1130 for another appointment. Per Dr. Delton Coombes, it is ok to bring pt back tomorrow for injection. Scheduling made aware and appointments for injection and lab/treatment made. Pt discharged in satisfactory condition with follow up instructions.

## 2019-06-12 NOTE — Progress Notes (Signed)

## 2019-06-13 ENCOUNTER — Ambulatory Visit (HOSPITAL_COMMUNITY): Payer: Medicare Other

## 2019-06-13 ENCOUNTER — Inpatient Hospital Stay (HOSPITAL_COMMUNITY): Payer: Medicare Other

## 2019-06-13 VITALS — BP 122/72 | HR 65 | Temp 97.5°F | Resp 18

## 2019-06-13 DIAGNOSIS — C770 Secondary and unspecified malignant neoplasm of lymph nodes of head, face and neck: Secondary | ICD-10-CM | POA: Diagnosis not present

## 2019-06-13 DIAGNOSIS — Z800 Family history of malignant neoplasm of digestive organs: Secondary | ICD-10-CM | POA: Diagnosis not present

## 2019-06-13 DIAGNOSIS — J449 Chronic obstructive pulmonary disease, unspecified: Secondary | ICD-10-CM | POA: Diagnosis not present

## 2019-06-13 DIAGNOSIS — C799 Secondary malignant neoplasm of unspecified site: Secondary | ICD-10-CM

## 2019-06-13 DIAGNOSIS — C099 Malignant neoplasm of tonsil, unspecified: Secondary | ICD-10-CM

## 2019-06-13 DIAGNOSIS — Z9221 Personal history of antineoplastic chemotherapy: Secondary | ICD-10-CM | POA: Diagnosis not present

## 2019-06-13 DIAGNOSIS — IMO0002 Reserved for concepts with insufficient information to code with codable children: Secondary | ICD-10-CM

## 2019-06-13 DIAGNOSIS — Z801 Family history of malignant neoplasm of trachea, bronchus and lung: Secondary | ICD-10-CM | POA: Diagnosis not present

## 2019-06-13 DIAGNOSIS — Z5111 Encounter for antineoplastic chemotherapy: Secondary | ICD-10-CM | POA: Diagnosis not present

## 2019-06-13 DIAGNOSIS — E785 Hyperlipidemia, unspecified: Secondary | ICD-10-CM | POA: Diagnosis not present

## 2019-06-13 DIAGNOSIS — Z833 Family history of diabetes mellitus: Secondary | ICD-10-CM | POA: Diagnosis not present

## 2019-06-13 DIAGNOSIS — Z923 Personal history of irradiation: Secondary | ICD-10-CM | POA: Diagnosis not present

## 2019-06-13 DIAGNOSIS — Z510 Encounter for antineoplastic radiation therapy: Secondary | ICD-10-CM | POA: Diagnosis not present

## 2019-06-13 DIAGNOSIS — Z79899 Other long term (current) drug therapy: Secondary | ICD-10-CM | POA: Diagnosis not present

## 2019-06-13 DIAGNOSIS — Z7982 Long term (current) use of aspirin: Secondary | ICD-10-CM | POA: Diagnosis not present

## 2019-06-13 DIAGNOSIS — E860 Dehydration: Secondary | ICD-10-CM

## 2019-06-13 DIAGNOSIS — Z5189 Encounter for other specified aftercare: Secondary | ICD-10-CM | POA: Diagnosis not present

## 2019-06-13 DIAGNOSIS — G2581 Restless legs syndrome: Secondary | ICD-10-CM | POA: Diagnosis not present

## 2019-06-13 DIAGNOSIS — Z8249 Family history of ischemic heart disease and other diseases of the circulatory system: Secondary | ICD-10-CM | POA: Diagnosis not present

## 2019-06-13 DIAGNOSIS — C090 Malignant neoplasm of tonsillar fossa: Secondary | ICD-10-CM | POA: Diagnosis not present

## 2019-06-13 MED ORDER — FILGRASTIM-SNDZ 480 MCG/0.8ML IJ SOSY
480.0000 ug | PREFILLED_SYRINGE | Freq: Once | INTRAMUSCULAR | Status: AC
  Administered 2019-06-13: 480 ug via SUBCUTANEOUS
  Filled 2019-06-13: qty 0.8

## 2019-06-13 NOTE — Patient Instructions (Signed)
[place] at [facility]  Discharge Instructions:  Zarxio injection received today for ANC 1.1. This medication is to increase your white blood cell count which helps prevent fevers and infections. Your counts must be improved before you can receive chemotherapy. Follow up Thursday as scheduled for lab work and possible chemotherapy treatment depending on your lab results.  Filgrastim, G-CSF injection What is this medicine? FILGRASTIM, G-CSF (fil GRA stim) is a granulocyte colony-stimulating factor that stimulates the growth of neutrophils, a type of white blood cell (WBC) important in the body's fight against infection. It is used to reduce the incidence of fever and infection in patients with certain types of cancer who are receiving chemotherapy that affects the bone marrow, to stimulate blood cell production for removal of WBCs from the body prior to a bone marrow transplantation, to reduce the incidence of fever and infection in patients who have severe chronic neutropenia, and to improve survival outcomes following high-dose radiation exposure that is toxic to the bone marrow. This medicine may be used for other purposes; ask your health care provider or pharmacist if you have questions. COMMON BRAND NAME(S): Neupogen, Nivestym, Zarxio What should I tell my health care provider before I take this medicine? They need to know if you have any of these conditions:  kidney disease  latex allergy  ongoing radiation therapy  sickle cell disease  an unusual or allergic reaction to filgrastim, pegfilgrastim, other medicines, foods, dyes, or preservatives  pregnant or trying to get pregnant  breast-feeding How should I use this medicine? This medicine is for injection under the skin or infusion into a vein. As an infusion into a vein, it is usually given by a health care professional in a hospital or clinic setting. If you get this medicine at home, you will be taught  how to prepare and give this medicine. Refer to the Instructions for Use that come with your medication packaging. Use exactly as directed. Take your medicine at regular intervals. Do not take your medicine more often than directed. It is important that you put your used needles and syringes in a special sharps container. Do not put them in a trash can. If you do not have a sharps container, call your pharmacist or healthcare provider to get one. Talk to your pediatrician regarding the use of this medicine in children. While this drug may be prescribed for children as young as 7 months for selected conditions, precautions do apply. Overdosage: If you think you have taken too much of this medicine contact a poison control center or emergency room at once. NOTE: This medicine is only for you. Do not share this medicine with others. What if I miss a dose? It is important not to miss your dose. Call your doctor or health care professional if you miss a dose. What may interact with this medicine? This medicine may interact with the following medications:  medicines that may cause a release of neutrophils, such as lithium This list may not describe all possible interactions. Give your health care provider a list of all the medicines, herbs, non-prescription drugs, or dietary supplements you use. Also tell them if you smoke, drink alcohol, or use illegal drugs. Some items may interact with your medicine. What should I watch for while using this medicine? You may need blood work done while you are taking this medicine. What side effects may I notice from receiving this medicine? Side effects that you should report to your doctor or  health care professional as soon as possible:  allergic reactions like skin rash, itching or hives, swelling of the face, lips, or tongue  back pain  dizziness or feeling faint  fever  pain, redness, or irritation at site where injected  pinpoint red spots on the  skin  shortness of breath or breathing problems  signs and symptoms of kidney injury like trouble passing urine, change in the amount of urine, or red or dark-brown urine  stomach or side pain, or pain at the shoulder  swelling  tiredness  unusual bleeding or bruising Side effects that usually do not require medical attention (report to your doctor or health care professional if they continue or are bothersome):  bone pain  cough  diarrhea  hair loss  headache  muscle pain This list may not describe all possible side effects. Call your doctor for medical advice about side effects. You may report side effects to FDA at 1-800-FDA-1088. Where should I keep my medicine? Keep out of the reach of children. Store in a refrigerator between 2 and 8 degrees C (36 and 46 degrees F). Do not freeze. Keep in carton to protect from light. Throw away this medicine if vials or syringes are left out of the refrigerator for more than 24 hours. Throw away any unused medicine after the expiration date. NOTE: This sheet is a summary. It may not cover all possible information. If you have questions about this medicine, talk to your doctor, pharmacist, or health care provider.  2020 Elsevier/Gold Standard (2017-12-09 16:55:01)  _______________________________________________________________  Thank you for choosing [place] at [facility] to provide your oncology and hematology care.  To afford each patient quality time with our providers, please arrive at least 15 minutes before your scheduled appointment.  You need to re-schedule your appointment if you arrive 10 or more minutes late.  We strive to give you quality time with our providers, and arriving late affects you and other patients whose appointments are after yours.  Also, if you no show three or more times for appointments you may be dismissed from the clinic.  Again, thank you for choosing [place] at  [place] hope is that these requests will allow you access to exceptional care and in a timely manner. _______________________________________________________________  If you have questions after your visit, please contact our office at [phone] between the hours of 8:30 a.m. and 5:00 p.m. Voicemails left after 4:30 p.m. will not be returned until the following business day. _______________________________________________________________  For prescription refill requests, have your pharmacy contact our office. _______________________________________________________________  Recommendations made by the consultant and any test results will be sent to your referring physician. _______________________________________________________________

## 2019-06-13 NOTE — Progress Notes (Signed)
[name] presents today for Zarxio injection for treatment of ANC 1.1 yesterday. Authorization has been confirmed. Pt denies any fevers or chills, no new complaints over night. Injection given and tolerated without incident or complaint. Site clean and dry, see MAR for details. VSS. Pt discharged in satisfactory condition with follow up instructions.

## 2019-06-14 ENCOUNTER — Other Ambulatory Visit: Payer: Self-pay | Admitting: Family Medicine

## 2019-06-14 DIAGNOSIS — Z510 Encounter for antineoplastic radiation therapy: Secondary | ICD-10-CM | POA: Diagnosis not present

## 2019-06-14 DIAGNOSIS — C090 Malignant neoplasm of tonsillar fossa: Secondary | ICD-10-CM | POA: Diagnosis not present

## 2019-06-15 ENCOUNTER — Inpatient Hospital Stay (HOSPITAL_COMMUNITY): Payer: Medicare Other

## 2019-06-15 ENCOUNTER — Other Ambulatory Visit: Payer: Self-pay

## 2019-06-15 VITALS — BP 95/55 | HR 59 | Temp 96.9°F | Resp 16

## 2019-06-15 DIAGNOSIS — Z801 Family history of malignant neoplasm of trachea, bronchus and lung: Secondary | ICD-10-CM | POA: Diagnosis not present

## 2019-06-15 DIAGNOSIS — Z7982 Long term (current) use of aspirin: Secondary | ICD-10-CM | POA: Diagnosis not present

## 2019-06-15 DIAGNOSIS — Z9221 Personal history of antineoplastic chemotherapy: Secondary | ICD-10-CM | POA: Diagnosis not present

## 2019-06-15 DIAGNOSIS — C770 Secondary and unspecified malignant neoplasm of lymph nodes of head, face and neck: Secondary | ICD-10-CM | POA: Diagnosis not present

## 2019-06-15 DIAGNOSIS — E785 Hyperlipidemia, unspecified: Secondary | ICD-10-CM | POA: Diagnosis not present

## 2019-06-15 DIAGNOSIS — Z5189 Encounter for other specified aftercare: Secondary | ICD-10-CM | POA: Diagnosis not present

## 2019-06-15 DIAGNOSIS — J449 Chronic obstructive pulmonary disease, unspecified: Secondary | ICD-10-CM | POA: Diagnosis not present

## 2019-06-15 DIAGNOSIS — Z833 Family history of diabetes mellitus: Secondary | ICD-10-CM | POA: Diagnosis not present

## 2019-06-15 DIAGNOSIS — Z923 Personal history of irradiation: Secondary | ICD-10-CM | POA: Diagnosis not present

## 2019-06-15 DIAGNOSIS — Z5111 Encounter for antineoplastic chemotherapy: Secondary | ICD-10-CM | POA: Diagnosis not present

## 2019-06-15 DIAGNOSIS — Z79899 Other long term (current) drug therapy: Secondary | ICD-10-CM | POA: Diagnosis not present

## 2019-06-15 DIAGNOSIS — Z510 Encounter for antineoplastic radiation therapy: Secondary | ICD-10-CM | POA: Diagnosis not present

## 2019-06-15 DIAGNOSIS — Z8249 Family history of ischemic heart disease and other diseases of the circulatory system: Secondary | ICD-10-CM | POA: Diagnosis not present

## 2019-06-15 DIAGNOSIS — C099 Malignant neoplasm of tonsil, unspecified: Secondary | ICD-10-CM

## 2019-06-15 DIAGNOSIS — C090 Malignant neoplasm of tonsillar fossa: Secondary | ICD-10-CM | POA: Diagnosis not present

## 2019-06-15 DIAGNOSIS — Z95828 Presence of other vascular implants and grafts: Secondary | ICD-10-CM

## 2019-06-15 DIAGNOSIS — G2581 Restless legs syndrome: Secondary | ICD-10-CM | POA: Diagnosis not present

## 2019-06-15 DIAGNOSIS — Z800 Family history of malignant neoplasm of digestive organs: Secondary | ICD-10-CM | POA: Diagnosis not present

## 2019-06-15 LAB — CBC WITH DIFFERENTIAL/PLATELET
Abs Immature Granulocytes: 0.32 10*3/uL — ABNORMAL HIGH (ref 0.00–0.07)
Basophils Absolute: 0 10*3/uL (ref 0.0–0.1)
Basophils Relative: 0 %
Eosinophils Absolute: 0 10*3/uL (ref 0.0–0.5)
Eosinophils Relative: 1 %
HCT: 32.7 % — ABNORMAL LOW (ref 39.0–52.0)
Hemoglobin: 10.4 g/dL — ABNORMAL LOW (ref 13.0–17.0)
Immature Granulocytes: 10 %
Lymphocytes Relative: 15 %
Lymphs Abs: 0.5 10*3/uL — ABNORMAL LOW (ref 0.7–4.0)
MCH: 32.4 pg (ref 26.0–34.0)
MCHC: 31.8 g/dL (ref 30.0–36.0)
MCV: 101.9 fL — ABNORMAL HIGH (ref 80.0–100.0)
Monocytes Absolute: 0.2 10*3/uL (ref 0.1–1.0)
Monocytes Relative: 7 %
Neutro Abs: 2.1 10*3/uL (ref 1.7–7.7)
Neutrophils Relative %: 67 %
Platelets: 84 10*3/uL — ABNORMAL LOW (ref 150–400)
RBC: 3.21 MIL/uL — ABNORMAL LOW (ref 4.22–5.81)
RDW: 14.7 % (ref 11.5–15.5)
WBC: 3.1 10*3/uL — ABNORMAL LOW (ref 4.0–10.5)
nRBC: 0 % (ref 0.0–0.2)

## 2019-06-15 MED ORDER — SODIUM CHLORIDE 0.9 % IV SOLN
10.0000 mg | Freq: Once | INTRAVENOUS | Status: AC
  Administered 2019-06-15: 10 mg via INTRAVENOUS
  Filled 2019-06-15: qty 10

## 2019-06-15 MED ORDER — SODIUM CHLORIDE 0.9 % IV SOLN
32.0000 mg/m2 | Freq: Once | INTRAVENOUS | Status: AC
  Administered 2019-06-15: 62 mg via INTRAVENOUS
  Filled 2019-06-15: qty 62

## 2019-06-15 MED ORDER — SODIUM CHLORIDE 0.9 % IV SOLN
150.0000 mg | Freq: Once | INTRAVENOUS | Status: AC
  Administered 2019-06-15: 150 mg via INTRAVENOUS
  Filled 2019-06-15: qty 150

## 2019-06-15 MED ORDER — SODIUM CHLORIDE 0.9% FLUSH
10.0000 mL | INTRAVENOUS | Status: DC | PRN
  Administered 2019-06-15: 09:00:00 10 mL

## 2019-06-15 MED ORDER — PALONOSETRON HCL INJECTION 0.25 MG/5ML
0.2500 mg | Freq: Once | INTRAVENOUS | Status: AC
  Administered 2019-06-15: 0.25 mg via INTRAVENOUS
  Filled 2019-06-15: qty 5

## 2019-06-15 MED ORDER — POTASSIUM CHLORIDE 2 MEQ/ML IV SOLN
Freq: Once | INTRAVENOUS | Status: AC
  Filled 2019-06-15: qty 10

## 2019-06-15 MED ORDER — SODIUM CHLORIDE 0.9 % IV SOLN: Freq: Once | INTRAVENOUS | Status: AC

## 2019-06-15 MED ORDER — HEPARIN SOD (PORK) LOCK FLUSH 100 UNIT/ML IV SOLN
500.0000 [IU] | Freq: Once | INTRAVENOUS | Status: AC | PRN
  Administered 2019-06-15: 500 [IU]

## 2019-06-15 NOTE — Progress Notes (Signed)
Labs reviewed with MD today. ANC 2.1 and platelets were 84. Proceed as planned per Dr. Delton Coombes note from this week.   Treatment given per orders. Patient tolerated it well without problems. Vitals stable and discharged home from clinic ambulatory. Follow up as scheduled.

## 2019-06-15 NOTE — Patient Instructions (Signed)
[facility] Discharge Instructions for Patients Receiving Chemotherapy  Today you received the following chemotherapy agents   To help prevent nausea and vomiting after your treatment, we encourage you to take your nausea medication   If you develop nausea and vomiting that is not controlled by your nausea medication, call the clinic.   BELOW ARE SYMPTOMS THAT SHOULD BE REPORTED IMMEDIATELY:  *FEVER GREATER THAN 100.5 F  *CHILLS WITH OR WITHOUT FEVER  NAUSEA AND VOMITING THAT IS NOT CONTROLLED WITH YOUR NAUSEA MEDICATION  *UNUSUAL SHORTNESS OF BREATH  *UNUSUAL BRUISING OR BLEEDING  TENDERNESS IN MOUTH AND THROAT WITH OR WITHOUT PRESENCE OF ULCERS  *URINARY PROBLEMS  *BOWEL PROBLEMS  UNUSUAL RASH Items with * indicate a potential emergency and should be followed up as soon as possible.  Feel free to call the clinic should you have any questions or concerns. The clinic phone number is [phone].  Please show the CHEMO ALERT CARD at check-in to the Emergency Department and triage nurse.   

## 2019-06-15 NOTE — Progress Notes (Signed)
Using CMET from 06/12/19  Henreitta Leber, PharmD

## 2019-06-16 ENCOUNTER — Ambulatory Visit (HOSPITAL_COMMUNITY): Payer: Medicare Other

## 2019-06-16 ENCOUNTER — Telehealth (HOSPITAL_COMMUNITY): Payer: Self-pay

## 2019-06-16 DIAGNOSIS — Z510 Encounter for antineoplastic radiation therapy: Secondary | ICD-10-CM | POA: Diagnosis not present

## 2019-06-16 DIAGNOSIS — C090 Malignant neoplasm of tonsillar fossa: Secondary | ICD-10-CM | POA: Diagnosis not present

## 2019-06-16 NOTE — Telephone Encounter (Signed)
Nutrition Follow-up:   Patient returned RD's call.    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  Patient receiving chemotherapy and radiation therapy.   Spoke with patient via phone this afternoon.  Patient reports that he is mainly drinking ensure/boost shakes 7 per day and water.  Ritta Slot is better.  Reports little pain in back of throat and increased phelgm.      Medications: reviewed  Labs: reviewed  Anthropometrics:   Weight 158 lb on 4/22 decreased from 162 lb on 3.2 oz  170 lb on 3/22 172 lb on 3/10   NUTRITION DIAGNOSIS: Predicted suboptimal energy intake continues   INTERVENTION:  Continue ensure enlive/boost shakes daily, 6-7 to better meet estimated energy needs.  [id number] calories and 134 g protein Will provide another case of ensure enlive for patient to pick up next week.   Patient has contact information    MONITORING, EVALUATION, GOAL: Patient will consume adequate calories and protein to prevent weight loss   NEXT VISIT: May 7th phone f/u  Ashleyann Shoun B. Zenia Resides, [place], [place] Registered Dietitian [phone] (pager)

## 2019-06-16 NOTE — Progress Notes (Signed)
Nutrition  Called patient for follow-up.  No answer. Left message on voicemail.   Cory Foley B. Zenia Resides, [place], [place] Registered Dietitian [phone] (pager)

## 2019-06-19 ENCOUNTER — Ambulatory Visit (HOSPITAL_COMMUNITY): Payer: Medicare Other

## 2019-06-19 ENCOUNTER — Ambulatory Visit (HOSPITAL_COMMUNITY): Payer: Medicare Other | Admitting: Hematology

## 2019-06-19 ENCOUNTER — Other Ambulatory Visit (HOSPITAL_COMMUNITY): Payer: Medicare Other

## 2019-06-19 DIAGNOSIS — Z510 Encounter for antineoplastic radiation therapy: Secondary | ICD-10-CM | POA: Diagnosis not present

## 2019-06-19 DIAGNOSIS — C090 Malignant neoplasm of tonsillar fossa: Secondary | ICD-10-CM | POA: Diagnosis not present

## 2019-06-20 DIAGNOSIS — Z510 Encounter for antineoplastic radiation therapy: Secondary | ICD-10-CM | POA: Diagnosis not present

## 2019-06-20 DIAGNOSIS — C090 Malignant neoplasm of tonsillar fossa: Secondary | ICD-10-CM | POA: Diagnosis not present

## 2019-06-20 NOTE — Progress Notes (Signed)

## 2019-06-21 DIAGNOSIS — Z510 Encounter for antineoplastic radiation therapy: Secondary | ICD-10-CM | POA: Diagnosis not present

## 2019-06-21 DIAGNOSIS — C090 Malignant neoplasm of tonsillar fossa: Secondary | ICD-10-CM | POA: Diagnosis not present

## 2019-06-22 ENCOUNTER — Inpatient Hospital Stay (HOSPITAL_COMMUNITY): Payer: Medicare Other

## 2019-06-22 ENCOUNTER — Other Ambulatory Visit: Payer: Self-pay

## 2019-06-22 ENCOUNTER — Encounter (HOSPITAL_COMMUNITY): Payer: Self-pay | Admitting: Hematology

## 2019-06-22 ENCOUNTER — Encounter (HOSPITAL_COMMUNITY): Payer: Self-pay

## 2019-06-22 ENCOUNTER — Inpatient Hospital Stay (HOSPITAL_COMMUNITY): Payer: Medicare Other | Admitting: Hematology

## 2019-06-22 VITALS — BP 113/56 | HR 50 | Temp 97.5°F | Resp 18

## 2019-06-22 DIAGNOSIS — C090 Malignant neoplasm of tonsillar fossa: Secondary | ICD-10-CM | POA: Diagnosis not present

## 2019-06-22 DIAGNOSIS — C770 Secondary and unspecified malignant neoplasm of lymph nodes of head, face and neck: Secondary | ICD-10-CM | POA: Diagnosis not present

## 2019-06-22 DIAGNOSIS — Z79899 Other long term (current) drug therapy: Secondary | ICD-10-CM | POA: Diagnosis not present

## 2019-06-22 DIAGNOSIS — C099 Malignant neoplasm of tonsil, unspecified: Secondary | ICD-10-CM | POA: Diagnosis not present

## 2019-06-22 DIAGNOSIS — Z5189 Encounter for other specified aftercare: Secondary | ICD-10-CM | POA: Diagnosis not present

## 2019-06-22 DIAGNOSIS — Z923 Personal history of irradiation: Secondary | ICD-10-CM | POA: Diagnosis not present

## 2019-06-22 DIAGNOSIS — Z800 Family history of malignant neoplasm of digestive organs: Secondary | ICD-10-CM | POA: Diagnosis not present

## 2019-06-22 DIAGNOSIS — Z95828 Presence of other vascular implants and grafts: Secondary | ICD-10-CM

## 2019-06-22 DIAGNOSIS — Z9221 Personal history of antineoplastic chemotherapy: Secondary | ICD-10-CM | POA: Diagnosis not present

## 2019-06-22 DIAGNOSIS — Z7982 Long term (current) use of aspirin: Secondary | ICD-10-CM | POA: Diagnosis not present

## 2019-06-22 DIAGNOSIS — G2581 Restless legs syndrome: Secondary | ICD-10-CM | POA: Diagnosis not present

## 2019-06-22 DIAGNOSIS — Z801 Family history of malignant neoplasm of trachea, bronchus and lung: Secondary | ICD-10-CM | POA: Diagnosis not present

## 2019-06-22 DIAGNOSIS — Z5111 Encounter for antineoplastic chemotherapy: Secondary | ICD-10-CM | POA: Diagnosis not present

## 2019-06-22 DIAGNOSIS — Z8249 Family history of ischemic heart disease and other diseases of the circulatory system: Secondary | ICD-10-CM | POA: Diagnosis not present

## 2019-06-22 DIAGNOSIS — Z833 Family history of diabetes mellitus: Secondary | ICD-10-CM | POA: Diagnosis not present

## 2019-06-22 DIAGNOSIS — E785 Hyperlipidemia, unspecified: Secondary | ICD-10-CM | POA: Diagnosis not present

## 2019-06-22 DIAGNOSIS — Z510 Encounter for antineoplastic radiation therapy: Secondary | ICD-10-CM | POA: Diagnosis not present

## 2019-06-22 DIAGNOSIS — J449 Chronic obstructive pulmonary disease, unspecified: Secondary | ICD-10-CM | POA: Diagnosis not present

## 2019-06-22 LAB — COMPREHENSIVE METABOLIC PANEL
ALT: 15 U/L (ref 0–44)
AST: 17 U/L (ref 15–41)
Albumin: 4 g/dL (ref 3.5–5.0)
Alkaline Phosphatase: 49 U/L (ref 38–126)
Anion gap: 9 (ref 5–15)
BUN: 17 mg/dL (ref 8–23)
CO2: 24 mmol/L (ref 22–32)
Calcium: 8.8 mg/dL — ABNORMAL LOW (ref 8.9–10.3)
Chloride: 104 mmol/L (ref 98–111)
Creatinine, Ser: 0.93 mg/dL (ref 0.61–1.24)
GFR calc Af Amer: >60 mL/min (ref >60)
GFR calc non Af Amer: >60 mL/min (ref >60)
Glucose, Bld: 93 mg/dL (ref 70–99)
Potassium: 4 mmol/L (ref 3.5–5.1)
Sodium: 137 mmol/L (ref 135–145)
Total Bilirubin: 0.5 mg/dL (ref 0.3–1.2)
Total Protein: 6.4 g/dL — ABNORMAL LOW (ref 6.5–8.1)

## 2019-06-22 LAB — CBC WITH DIFFERENTIAL/PLATELET
Abs Immature Granulocytes: 0.01 10*3/uL (ref 0.00–0.07)
Basophils Absolute: 0 10*3/uL (ref 0.0–0.1)
Basophils Relative: 0 %
Eosinophils Absolute: 0 10*3/uL (ref 0.0–0.5)
Eosinophils Relative: 1 %
HCT: 31.5 % — ABNORMAL LOW (ref 39.0–52.0)
Hemoglobin: 10.3 g/dL — ABNORMAL LOW (ref 13.0–17.0)
Immature Granulocytes: 0 %
Lymphocytes Relative: 19 %
Lymphs Abs: 0.5 10*3/uL — ABNORMAL LOW (ref 0.7–4.0)
MCH: 33.2 pg (ref 26.0–34.0)
MCHC: 32.7 g/dL (ref 30.0–36.0)
MCV: 101.6 fL — ABNORMAL HIGH (ref 80.0–100.0)
Monocytes Absolute: 0.3 10*3/uL (ref 0.1–1.0)
Monocytes Relative: 14 %
Neutro Abs: 1.6 10*3/uL — ABNORMAL LOW (ref 1.7–7.7)
Neutrophils Relative %: 66 %
Platelets: 154 10*3/uL (ref 150–400)
RBC: 3.1 MIL/uL — ABNORMAL LOW (ref 4.22–5.81)
RDW: 15.1 % (ref 11.5–15.5)
WBC: 2.4 10*3/uL — ABNORMAL LOW (ref 4.0–10.5)
nRBC: 0 % (ref 0.0–0.2)

## 2019-06-22 LAB — MAGNESIUM: Magnesium: 2 mg/dL (ref 1.7–2.4)

## 2019-06-22 MED ORDER — PALONOSETRON HCL INJECTION 0.25 MG/5ML
0.2500 mg | Freq: Once | INTRAVENOUS | Status: AC
  Administered 2019-06-22: 0.25 mg via INTRAVENOUS
  Filled 2019-06-22: qty 5

## 2019-06-22 MED ORDER — POTASSIUM CHLORIDE 2 MEQ/ML IV SOLN
Freq: Once | INTRAVENOUS | Status: AC
  Filled 2019-06-22: qty 10

## 2019-06-22 MED ORDER — SODIUM CHLORIDE 0.9 % IV SOLN
150.0000 mg | Freq: Once | INTRAVENOUS | Status: AC
  Administered 2019-06-22: 150 mg via INTRAVENOUS
  Filled 2019-06-22: qty 150

## 2019-06-22 MED ORDER — FLUCONAZOLE 100 MG PO TABS
100.0000 mg | ORAL_TABLET | Freq: Every day | ORAL | 0 refills | Status: DC
Start: 2019-06-22 — End: 2019-06-29

## 2019-06-22 MED ORDER — SODIUM CHLORIDE 0.9% FLUSH
10.0000 mL | INTRAVENOUS | Status: DC | PRN
  Administered 2019-06-22: 10 mL

## 2019-06-22 MED ORDER — SODIUM CHLORIDE 0.9 % IV SOLN
40.0000 mg/m2 | Freq: Once | INTRAVENOUS | Status: AC
  Administered 2019-06-22: 13:00:00 77 mg via INTRAVENOUS
  Filled 2019-06-22: qty 77

## 2019-06-22 MED ORDER — SODIUM CHLORIDE 0.9 % IV SOLN: Freq: Once | INTRAVENOUS | Status: AC

## 2019-06-22 MED ORDER — MAGIC MOUTHWASH W/LIDOCAINE: 5.0000 mL | Freq: Four times a day (QID) | ORAL | 2 refills | Status: DC

## 2019-06-22 MED ORDER — SODIUM CHLORIDE 0.9 % IV SOLN
10.0000 mg | Freq: Once | INTRAVENOUS | Status: AC
  Administered 2019-06-22: 10 mg via INTRAVENOUS
  Filled 2019-06-22: qty 10

## 2019-06-22 MED ORDER — HEPARIN SOD (PORK) LOCK FLUSH 100 UNIT/ML IV SOLN
500.0000 [IU] | Freq: Once | INTRAVENOUS | Status: AC | PRN
  Administered 2019-06-22: 500 [IU]

## 2019-06-22 NOTE — Progress Notes (Signed)
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  CURRENT THERAPY: Chemoradiation therapy.  BRIEF ONCOLOGIC HISTORY:  Oncology History  Tonsillar cancer ([place])  04/07/2019 Initial Diagnosis   Tonsillar cancer ([place])   05/08/2019 -  Chemotherapy   The patient had palonosetron (ALOXI) injection 0.25 mg, 0.25 mg, Intravenous,  Once, 5 of 7 cycles Administration: 0.25 mg (05/08/2019), 0.25 mg (05/15/2019), 0.25 mg (05/22/2019), 0.25 mg (05/29/2019), 0.25 mg (06/15/2019) CISplatin (PLATINOL) 77 mg in sodium chloride 0.9 % 250 mL chemo infusion, 40 mg/m2 = 77 mg, Intravenous,  Once, 5 of 7 cycles Dose modification: 32 mg/m2 (80 % of original dose 40 mg/m2, Cycle 5, Reason: Other (see comments), Comment: cytopenia) Administration: 77 mg (05/08/2019), 77 mg (05/15/2019), 77 mg (05/22/2019), 77 mg (05/29/2019), 62 mg (06/15/2019) fosaprepitant (EMEND) 150 mg in sodium chloride 0.9 % 145 mL IVPB, 150 mg, Intravenous,  Once, 5 of 7 cycles Administration: 150 mg (05/08/2019), 150 mg (05/15/2019), 150 mg (05/22/2019), 150 mg (05/29/2019), 150 mg (06/15/2019)  for chemotherapy treatment.       CANCER STAGING: Cancer Staging Tonsillar cancer ([place]) Staging form: Pharynx - HPV-Mediated Oropharynx, AJCC 8th Edition - Clinical stage from 04/07/2019: Stage I (cT2, cN1, cM0, p16+) - Unsigned    INTERVAL HISTORY:  [name] 69 y.o. male seen for follow-up of tonsillar carcinoma and toxicity assessment prior to weekly cisplatin chemotherapy.  He lost about 4 pounds since last week.  He reports burning in the throat particularly on the left side while on swallowing.  Hence he decreased his Ensure to 2 to 3 cans/day.  He also reports some fatigue.  Denies any major vomiting although he has occasional  nausea.  Appetite and energy levels are 25%.  Denies any tingling or numbness in extremities.   REVIEW OF SYSTEMS:  Review of Systems  HENT:   Positive for trouble swallowing.   Gastrointestinal: Positive for nausea.  All other systems reviewed and are negative.    PAST MEDICAL/SURGICAL HISTORY:  Past Medical History:  Diagnosis Date  . Allergy   . Anxiety   . Arthritis   . BPH with obstruction/lower urinary tract symptoms   . COPD, mild ([place])    current smoker  . ED (erectile dysfunction)   . GERD (gastroesophageal reflux disease)   . Hyperlipidemia   . Nocturia   . Osteoporosis   . PAD (peripheral artery disease) ([place])   . Port-A-Cath in place 04/28/2019  . Prostate cancer ([place]) 01/11/2012   Adenocarcinoma, Gleason=4+4=8, & 4+5=9,PSA=17.24, Volume= 15.45cc  . PVD (peripheral vascular disease) ([place])   . Restless leg syndrome   . Tongue mass    SCC   Past Surgical History:  Procedure Laterality Date  . ABDOMINAL AORTOGRAM W/LOWER EXTREMITY N/A 08/16/2018   Procedure: ABDOMINAL AORTOGRAM W/LOWER EXTREMITY;  Surgeon: Serafina Mitchell, MD;  Location: [facility];  Service: Cardiovascular;  Laterality: N/A;  Bilateral  . COLONOSCOPY N/A 09/28/2012   Procedure: COLONOSCOPY;  Surgeon: Danie Binder, MD;  [facility];  Service: Endoscopy;  Laterality: N/A;  8:30  . DIRECT LARYNGOSCOPY N/A 04/03/2019   Procedure: DIRECT LARYNGOSCOPY;  Surgeon: Leta Baptist, MD;  Location: [facility];  Service: ENT;  Laterality: N/A;  . ESOPHAGOGASTRODUODENOSCOPY N/A 09/28/2012   Procedure:  ESOPHAGOGASTRODUODENOSCOPY (EGD);  Surgeon: Danie Binder, MD;  [facility];  Service: Endoscopy;  Laterality: N/A;  . Gold seed implatation  04/28/2012  . HERNIA REPAIR     Bilateral inguinal X2  . IR IMAGING GUIDED PORT INSERTION  05/03/2019  . JOINT REPLACEMENT     bilateral  . KNEE SURGERY     Left Knee X 2   and Right knee X1  . MALONEY DILATION N/A 09/28/2012    Procedure: MALONEY DILATION;  Surgeon: Danie Binder, MD;  [facility];  Service: Endoscopy;  Laterality: N/A;  . NOSE SURGERY    . PERIPHERAL VASCULAR INTERVENTION  08/16/2018   Procedure: PERIPHERAL VASCULAR INTERVENTION;  Surgeon: Serafina Mitchell, MD;  Location: [facility];  Service: Cardiovascular;;  LT Iliac  . PR VEIN BYPASS GRAFT,AORTO-FEM-POP  10/21/10   Left AK to BK popliteal BPG  . PROSTATE BIOPSY  01/11/2012   Adenocarcinoma  . PROSTATE SURGERY  2015   Chemo and  Radiation  . SAVORY DILATION N/A 09/28/2012   Procedure: SAVORY DILATION;  Surgeon: Danie Binder, MD;  [facility];  Service: Endoscopy;  Laterality: N/A;  . throat biopsy  03/20/2019  . TONSILLECTOMY Left 04/03/2019   Procedure: TONSILLECTOMY;  Surgeon: Leta Baptist, MD;  Location: [facility];  Service: ENT;  Laterality: Left;     SOCIAL HISTORY:  Social History   Socioeconomic History  . Marital status: Married    Spouse name: Holley Raring  . Number of children: 2  . Years of education: College  . 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 Smoking status: Current Every Day Smoker    Packs/day: 0.50    Years: 43.00    Pack years: 21.50    Types: Cigarettes  . Smokeless tobacco: Never Used  . Tobacco comment: 1/2 ppd  Substance and Sexual Activity  . Alcohol use: No    Comment: quit 1.5 years ago  . Drug use: No  . Sexual activity: Not Currently  Other Topics Concern  . Not on file  Social History Narrative   Patient lives at home with spouse.   Caffeine use: very little   Social Determinants of Health   Financial Resource Strain: Low Risk   . Difficulty of Paying Living Expenses: Not hard at all  Food Insecurity: No Food Insecurity  . Worried About Charity fundraiser in the Last Year: Never true  . Ran Out of Food in the Last  Year: Never true  Transportation Needs: No Transportation Needs  . Lack of Transportation (Medical): No  . Lack of Transportation (Non-Medical): No  Physical Activity: Sufficiently Active  . Days of Exercise per Week: 5 days  . Minutes of Exercise per Session: 30 min  Stress: Stress Concern Present  . Feeling of Stress : Rather much  Social Connections: Slightly Isolated  . Frequency of Communication with Friends and Family: Twice a week  . Frequency of Social Gatherings with Friends and Family: Twice a week  . Attends Religious Services: More than 4 times per year  . Active Member of Clubs or Organizations: No  . Attends Archivist Meetings: Never  . Marital Status: Married  Human resources officer Violence: Not At Risk  . Fear of Current or Ex-Partner: No  . Emotionally  Abused: No  . Physically Abused: No  . Sexually Abused: No    FAMILY HISTORY:  Family History  Problem Relation Age of Onset  . Heart disease Mother        Valve regurgitation and Pacemaker   . Diabetes Mother   . Hyperlipidemia Mother   . Heart disease Father        CABG x 5  . Hyperlipidemia Father   . Hypertension Father   . Heart attack Father   . Heart disease Sister        aortic valve replacement  . Cancer Sister 47       Colon cancer w/ metastasis  . Throat cancer Sister   . Hypertension Brother   . Heart disease Maternal Grandmother   . Heart disease Maternal Grandfather   . Heart disease Paternal Grandmother   . Cancer Paternal Grandfather   . Healthy Son   . Healthy Daughter     CURRENT MEDICATIONS:  Outpatient Encounter Medications as of 06/22/2019  Medication Sig Note  . HYDROcodone-acetaminophen (NORCO) 10-325 MG tablet TAKE 1 TABLET BY MOUTH EVERY FOUR HOURS AS NEEDED.   Marland Kitchen lidocaine-prilocaine (EMLA) cream Apply a pea-sized amount to port a cath site and cover with plastic wrap 1 hour prior to appointment   . prochlorperazine (COMPAZINE) 10 MG tablet TAKE (1) TABLET BY MOUTH  EVERY SIX HOURS AS NEEDED.   Marland Kitchen aspirin EC 81 MG tablet Take 81 mg by mouth daily.   . calcium carbonate (OS-CAL) 600 MG tablet Take 1 tablet (600 mg total) by mouth 2 (two) times daily with a meal. (Patient taking differently: Take 600 mg by mouth 2 (two) times daily. )   . Cholecalciferol (VITAMIN D) 50 MCG (2000 UT) tablet Take 2,000 Units by mouth in the morning and at bedtime.   Marland Kitchen CISPLATIN IV Inject into the vein once a week.   . clopidogrel (PLAVIX) 75 MG tablet Take 75 mg by mouth daily.    . cyclobenzaprine (FLEXERIL) 5 MG tablet Take 5 mg by mouth at bedtime as needed for muscle spasms.    . famotidine (PEPCID) 20 MG tablet TAKE 1 TABLET BY MOUTH AT BEDTIME.   . fluconazole (DIFLUCAN) 100 MG tablet Take 1 tablet (100 mg total) by mouth daily. Take 2 tablets on day 1, then take 1 tablet for 6 days   . magic mouthwash w/lidocaine SOLN Take 5 mLs by mouth 4 (four) times daily. Take 5 mLs 5 minutesprior to meals.   . methocarbamol (ROBAXIN) 500 MG tablet Take 500 mg by mouth every 6 (six) hours as needed for muscle spasms.    . mirtazapine (REMERON) 15 MG tablet Take 1 tablet (15 mg total) by mouth at bedtime.   . pantoprazole (PROTONIX) 40 MG tablet TAKE ONE TABLET BY MOUTH DAILY. (Patient taking differently: Take 40 mg by mouth daily. )   . rOPINIRole (REQUIP) 3 MG tablet TAKE (1) TABLET BY MOUTH AT BEDTIME.   . simvastatin (ZOCOR) 40 MG tablet TAKE (1) TABLET BY MOUTH AT BEDTIME. (Patient taking differently: Take 40 mg by mouth at bedtime. )   . topiramate (TOPAMAX) 25 MG tablet TAKE 3 TABLETS BY MOUTH DAILY. (Patient taking differently: Take 50-75 mg by mouth at bedtime. ) 04/27/2019: If pt is having frequent migraines he'll take 75 mg at night, if not he'll take 50 mg at night   No facility-administered encounter medications on file as of 06/22/2019.    ALLERGIES:  Allergies  Allergen Reactions  .  Codeine Other (See Comments)    Bad headache and sweats  . Morphine And Related Itching      sweats  . Percocet [Oxycodone-Acetaminophen] Other (See Comments)    Headache and sweats     PHYSICAL EXAM:  ECOG Performance status: 1  Vitals:   06/22/19 0818  BP: 100/69  Pulse: 60  Resp: 18  Temp: (!) 96.9 F (36.1 C)  SpO2: 100%   Filed Weights   06/22/19 0818  Weight: 154 lb 12.8 oz (70.2 kg)    Physical Exam Vitals reviewed.  Constitutional:      Appearance: Normal appearance.  HENT:     Mouth/Throat:     Mouth: Mucous membranes are moist.  Cardiovascular:     Rate and Rhythm: Normal rate and regular rhythm.     Heart sounds: Normal heart sounds.  Pulmonary:     Effort: Pulmonary effort is normal.     Breath sounds: Normal breath sounds.  Abdominal:     General: There is no distension.     Palpations: Abdomen is soft.  Lymphadenopathy:     Cervical: No cervical adenopathy.  Skin:    General: Skin is warm.  Neurological:     General: No focal deficit present.     Mental Status: He is alert and oriented to person, place, and time.  Psychiatric:        Mood and Affect: Mood normal.        Behavior: Behavior normal.    Oral thrush present on the tongue.  LABORATORY DATA:  I have reviewed the labs as listed.  CBC    Component Value Date/Time   WBC 2.4 (L) 06/22/2019 0820   RBC 3.10 (L) 06/22/2019 0820   HGB 10.3 (L) 06/22/2019 0820   HCT 31.5 (L) 06/22/2019 0820   PLT 154 06/22/2019 0820   MCV 101.6 (H) 06/22/2019 0820   MCH 33.2 06/22/2019 0820   MCHC 32.7 06/22/2019 0820   RDW 15.1 06/22/2019 0820   LYMPHSABS 0.5 (L) 06/22/2019 0820   MONOABS 0.3 06/22/2019 0820   EOSABS 0.0 06/22/2019 0820   BASOSABS 0.0 06/22/2019 0820   CMP Latest Ref Rng & Units 06/22/2019 06/12/2019 06/05/2019  Glucose 70 - 99 mg/dL 93 119(H) 98  BUN 8 - 23 mg/dL 17 15 15   Creatinine 0.61 - 1.24 mg/dL 0.93 0.94 0.88  Sodium 135 - 145 mmol/L 137 138 137  Potassium 3.5 - 5.1 mmol/L 4.0 4.2 4.0  Chloride 98 - 111 mmol/L 104 103 104  CO2 22 - 32 mmol/L 24 25 24    Calcium 8.9 - 10.3 mg/dL 8.8(L) 9.5 9.3  Total Protein 6.5 - 8.1 g/dL 6.4(L) 6.5 6.4(L)  Total Bilirubin 0.3 - 1.2 mg/dL 0.5 0.5 0.5  Alkaline Phos 38 - 126 U/L 49 48 49  AST 15 - 41 U/L 17 22 25   ALT 0 - 44 U/L 15 17 24        DIAGNOSTIC IMAGING:  I have reviewed the scans.   ASSESSMENT & PLAN:   Tonsillar cancer ([place]) 1.  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 -Chemoradiation therapy with weekly cisplatin started on 05/08/2019. -He reported severe pain on swallowing and occasional nausea. -I reviewed his CBC today.  White count is low but his ANC is 1600.  Platelet count is normal.  He will proceed with  his last cycle of chemotherapy today.  He will finish radiation on Monday. -We will reevaluate him in 1 week to see if he needs any fluids.  2.  Nutrition/weight loss: -He lost 4 pounds since last visit last week.  He reports pain on swallowing mostly on the left side of the tongue. -He is drinking only 2 to 3 cans of Ensure plus daily.  He was drinking 5 to 6 cans last week. -He has thrush on the tongue.  I will give him Diflucan 200 mg on day one followed by 100 mg daily for 7 days. -We will start him on Magic mouthwash with lidocaine 6 times a day prior to drinking Ensure.  3.  Prostate cancer: -Gleason 9 prostate cancer, diagnosed in 2013, status post XRT from April 2014 through May 2014 with 2 years of Lupron.  PSA normal.      Orders placed this encounter:  No orders of the defined types were placed in this encounter.     Derek Jack, MD [place] [phone]

## 2019-06-22 NOTE — Progress Notes (Signed)
Labs reviewed today with MD. Will proceed as planned for treatment today.   Treatment given per orders. Patient tolerated it well without problems. Vitals stable and discharged home from clinic ambulatory. Follow up as scheduled.

## 2019-06-22 NOTE — Progress Notes (Signed)
Patient has been assessed, vital signs and labs have been reviewed by Dr. Katragadda. ANC, Creatinine, LFTs, and Platelets are within treatment parameters per Dr. Katragadda. The patient is good to proceed with treatment at this time.  

## 2019-06-22 NOTE — Progress Notes (Signed)
06/22/19  Confirmed cisplatin dose at 40 mg/m2  T.O. Dr Beckey Downing, LPN/Nayib Remer Ronnald Ramp, PharmD

## 2019-06-22 NOTE — Patient Instructions (Signed)
[facility] Discharge Instructions for Patients Receiving Chemotherapy  Today you received the following chemotherapy agents   To help prevent nausea and vomiting after your treatment, we encourage you to take your nausea medication   If you develop nausea and vomiting that is not controlled by your nausea medication, call the clinic.   BELOW ARE SYMPTOMS THAT SHOULD BE REPORTED IMMEDIATELY:  *FEVER GREATER THAN 100.5 F  *CHILLS WITH OR WITHOUT FEVER  NAUSEA AND VOMITING THAT IS NOT CONTROLLED WITH YOUR NAUSEA MEDICATION  *UNUSUAL SHORTNESS OF BREATH  *UNUSUAL BRUISING OR BLEEDING  TENDERNESS IN MOUTH AND THROAT WITH OR WITHOUT PRESENCE OF ULCERS  *URINARY PROBLEMS  *BOWEL PROBLEMS  UNUSUAL RASH Items with * indicate a potential emergency and should be followed up as soon as possible.  Feel free to call the clinic should you have any questions or concerns. The clinic phone number is [phone].  Please show the CHEMO ALERT CARD at check-in to the Emergency Department and triage nurse.   

## 2019-06-22 NOTE — Assessment & Plan Note (Signed)
1.  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 -Chemoradiation therapy with weekly cisplatin started on 05/08/2019. -He reported severe pain on swallowing and occasional nausea. -I reviewed his CBC today.  White count is low but his ANC is 1600.  Platelet count is normal.  He will proceed with his last cycle of chemotherapy today.  He will finish radiation on Monday. -We will reevaluate him in 1 week to see if he needs any fluids.  2.  Nutrition/weight loss: -He lost 4 pounds since last visit last week.  He reports pain on swallowing mostly on the left side of the tongue. -He is drinking only 2 to 3 cans of Ensure plus daily.  He was drinking 5 to 6 cans last week. -He has thrush on the tongue.  I will give him Diflucan 200 mg on day one followed by 100 mg daily for 7 days. -We will start him on Magic mouthwash with lidocaine 6 times a day prior to drinking Ensure.  3.  Prostate cancer: -Gleason 9 prostate cancer, diagnosed in 2013, status post XRT from April 2014 through May 2014 with 2 years of Lupron.  PSA normal.

## 2019-06-22 NOTE — Patient Instructions (Addendum)
[facility] at [facility] Discharge Instructions  You were seen today by Dr. Katragadda. He went over your recent lab results. He will see you back in 1 week for labs and follow up.   Thank you for choosing [facility] at [facility] to provide your oncology and hematology care.  To afford each patient quality time with our provider, please arrive at least 15 minutes before your scheduled appointment time.   If you have a lab appointment with the Cancer Center please come in thru the  Main Entrance and check in at the main information desk  You need to re-schedule your appointment should you arrive 10 or more minutes late.  We strive to give you quality time with our providers, and arriving late affects you and other patients whose appointments are after yours.  Also, if you no show three or more times for appointments you may be dismissed from the clinic at the providers discretion.     Again, thank you for choosing [facility].  Our hope is that these requests will decrease the amount of time that you wait before being seen by our physicians.       _____________________________________________________________  Should you have questions after your visit to  Cancer Center, please contact our office at [phone] between the hours of 8:00 a.m. and 4:30 p.m.  Voicemails left after 4:00 p.m. will not be returned until the following business day.  For prescription refill requests, have your pharmacy contact our office and allow 72 hours.    Cancer Center Support Programs:   > Cancer Support Group  2nd Tuesday of the month 1pm-2pm, Journey Room    

## 2019-06-23 ENCOUNTER — Encounter (HOSPITAL_COMMUNITY): Payer: Medicare Other

## 2019-06-23 DIAGNOSIS — C090 Malignant neoplasm of tonsillar fossa: Secondary | ICD-10-CM | POA: Diagnosis not present

## 2019-06-23 DIAGNOSIS — Z510 Encounter for antineoplastic radiation therapy: Secondary | ICD-10-CM | POA: Diagnosis not present

## 2019-06-26 ENCOUNTER — Other Ambulatory Visit (HOSPITAL_COMMUNITY): Payer: Self-pay | Admitting: *Deleted

## 2019-06-26 ENCOUNTER — Encounter: Payer: Self-pay | Admitting: Family Medicine

## 2019-06-26 ENCOUNTER — Ambulatory Visit (INDEPENDENT_AMBULATORY_CARE_PROVIDER_SITE_OTHER): Payer: Medicare Other | Admitting: Family Medicine

## 2019-06-26 ENCOUNTER — Other Ambulatory Visit: Payer: Self-pay

## 2019-06-26 VITALS — BP 102/60 | HR 58 | Temp 97.9°F | Resp 16 | Ht 68.0 in | Wt 152.0 lb

## 2019-06-26 DIAGNOSIS — E440 Moderate protein-calorie malnutrition: Secondary | ICD-10-CM

## 2019-06-26 DIAGNOSIS — C099 Malignant neoplasm of tonsil, unspecified: Secondary | ICD-10-CM

## 2019-06-26 DIAGNOSIS — J42 Unspecified chronic bronchitis: Secondary | ICD-10-CM | POA: Diagnosis not present

## 2019-06-26 DIAGNOSIS — E7800 Pure hypercholesterolemia, unspecified: Secondary | ICD-10-CM

## 2019-06-26 DIAGNOSIS — Z510 Encounter for antineoplastic radiation therapy: Secondary | ICD-10-CM | POA: Diagnosis not present

## 2019-06-26 DIAGNOSIS — C090 Malignant neoplasm of tonsillar fossa: Secondary | ICD-10-CM | POA: Diagnosis not present

## 2019-06-26 MED ORDER — MAGIC MOUTHWASH W/LIDOCAINE
5.0000 mL | Freq: Every day | ORAL | 2 refills | Status: DC
Start: 2019-06-26 — End: 2019-07-19

## 2019-06-26 MED ORDER — HYDROCODONE-ACETAMINOPHEN 10-325 MG PO TABS: ORAL_TABLET | ORAL | 0 refills | Status: DC

## 2019-06-26 NOTE — Telephone Encounter (Signed)
Patient never received prescription for magic mouth wash.  New prescription printed and faxed to his pharmacy.

## 2019-06-26 NOTE — Progress Notes (Signed)
   Subjective:    Patient ID: Cory Foley, male    DOB: Mar 04, 1950, 69 y.o.   MRN: [id number]  Patient presents for Follow-up (is fasting)  Pt here to  follow-up chronic medical problems.  Medications reviewed.  He is currently in treatment for tonsillar carcinoma.  He completed a round of chemotherapy as well as radiation.  He is followed by oncology.  He has had weight loss in the setting of the chemo and radiation therapy to treat his cancer.  He has been on protein supplements of Ensure and boost daily. He has not had had solid food in 4 weeks, he has tried soft foods with nausea meds and still vomits afterwards  weight down 20lbs   He Bowels have slowed down, taking colace   Urinating fine   Currently being Treated for thrush by oncology with Diflucan as well as mixture of baking soda, warm sater and salt    Chronic back pain  Has not been an issue, he takes the norco more for the neck discomfort   Skin no sign of recurrence of his prostate cancer.  Off the topamax for for the past few weeks , has not had HA since treatment for his cancer     Review Of Systems:  GEN- + fatigue, fever, +weight loss,weakness, recent illness HEENT- denies eye drainage, change in vision, nasal discharge, CVS- denies chest pain, palpitations RESP- denies SOB, cough, wheeze ABD- denies N/V, change in stools, abd pain GU- denies dysuria, hematuria, dribbling, incontinence MSK- + joint pain, muscle aches, injury Neuro- denies headache, dizziness, syncope, seizure activity       Objective:    BP 102/60   Pulse (!) 58   Temp 97.9 F (36.6 C) (Temporal)   Resp 16   Ht 5\' 8"  (1.727 m)   Wt 152 lb (68.9 kg)   SpO2 93%   BMI 23.11 kg/m  GEN- NAD, alert and oriented x3,weak appearing  HEENT- PERRL, EOMI, non injected sclera, pink conjunctiva, MMM, oropharynx -thrush on tongue  Neck- Supple, no LAD, skin in tact  CVS- RRR, no murmur, cap refill 3 sec  RESP-CTAB ABD-NABS,soft,NT,ND EXT-  trace ankle edema Pulses- Radial, 2+        Assessment & Plan:      Problem List Items Addressed This Visit      Unprioritized   COPD (chronic obstructive pulmonary disease) ([place]) - Primary    No symptoms or exacerbations noted      Hyperlipidemia    Recheck lipids at next visit , tolerating statin drug       Protein-calorie malnutrition ([place])    In setting of cancer treatments  Remain off topamax as this may exacerbate issues       Tonsillar cancer ([place])    Reviewed recent oncology notes Has f/u AND LABS this week Encourage small amounts of soft foods, such as applesauce, yogurt consistency Take nausea medicine 15-20 minutes ahead of time          Note: This dictation was prepared with Dragon dictation along with smaller phrase technology. Any transcriptional errors that result from this process are unintentional.

## 2019-06-26 NOTE — Assessment & Plan Note (Signed)
No symptoms or exacerbations noted

## 2019-06-26 NOTE — Assessment & Plan Note (Signed)
Reviewed recent oncology notes Has f/u AND LABS this week Encourage small amounts of soft foods, such as applesauce, yogurt consistency Take nausea medicine 15-20 minutes ahead of time

## 2019-06-26 NOTE — Patient Instructions (Signed)
F/U 4 months Physical  

## 2019-06-26 NOTE — Assessment & Plan Note (Signed)
Recheck lipids at next visit , tolerating statin drug

## 2019-06-26 NOTE — Assessment & Plan Note (Signed)
In setting of cancer treatments  Remain off topamax as this may exacerbate issues

## 2019-06-29 ENCOUNTER — Encounter (HOSPITAL_COMMUNITY): Payer: Self-pay | Admitting: Hematology

## 2019-06-29 ENCOUNTER — Inpatient Hospital Stay (HOSPITAL_COMMUNITY): Payer: Medicare Other | Attending: Hematology | Admitting: Hematology

## 2019-06-29 ENCOUNTER — Inpatient Hospital Stay (HOSPITAL_COMMUNITY): Payer: Medicare Other

## 2019-06-29 ENCOUNTER — Other Ambulatory Visit: Payer: Self-pay

## 2019-06-29 VITALS — BP 125/69 | HR 76 | Temp 97.7°F | Resp 18 | Wt 148.3 lb

## 2019-06-29 DIAGNOSIS — B370 Candidal stomatitis: Secondary | ICD-10-CM | POA: Insufficient documentation

## 2019-06-29 DIAGNOSIS — E860 Dehydration: Secondary | ICD-10-CM

## 2019-06-29 DIAGNOSIS — F17210 Nicotine dependence, cigarettes, uncomplicated: Secondary | ICD-10-CM | POA: Diagnosis not present

## 2019-06-29 DIAGNOSIS — Z800 Family history of malignant neoplasm of digestive organs: Secondary | ICD-10-CM | POA: Insufficient documentation

## 2019-06-29 DIAGNOSIS — Z801 Family history of malignant neoplasm of trachea, bronchus and lung: Secondary | ICD-10-CM | POA: Diagnosis not present

## 2019-06-29 DIAGNOSIS — D709 Neutropenia, unspecified: Secondary | ICD-10-CM | POA: Insufficient documentation

## 2019-06-29 DIAGNOSIS — Z809 Family history of malignant neoplasm, unspecified: Secondary | ICD-10-CM | POA: Diagnosis not present

## 2019-06-29 DIAGNOSIS — R634 Abnormal weight loss: Secondary | ICD-10-CM | POA: Insufficient documentation

## 2019-06-29 DIAGNOSIS — E612 Magnesium deficiency: Secondary | ICD-10-CM | POA: Diagnosis not present

## 2019-06-29 DIAGNOSIS — C799 Secondary malignant neoplasm of unspecified site: Secondary | ICD-10-CM | POA: Diagnosis not present

## 2019-06-29 DIAGNOSIS — IMO0002 Reserved for concepts with insufficient information to code with codable children: Secondary | ICD-10-CM

## 2019-06-29 DIAGNOSIS — C099 Malignant neoplasm of tonsil, unspecified: Secondary | ICD-10-CM

## 2019-06-29 DIAGNOSIS — Z8546 Personal history of malignant neoplasm of prostate: Secondary | ICD-10-CM | POA: Insufficient documentation

## 2019-06-29 DIAGNOSIS — Z923 Personal history of irradiation: Secondary | ICD-10-CM | POA: Diagnosis not present

## 2019-06-29 DIAGNOSIS — N289 Disorder of kidney and ureter, unspecified: Secondary | ICD-10-CM | POA: Insufficient documentation

## 2019-06-29 DIAGNOSIS — Z9221 Personal history of antineoplastic chemotherapy: Secondary | ICD-10-CM | POA: Insufficient documentation

## 2019-06-29 LAB — CBC WITH DIFFERENTIAL/PLATELET
Abs Immature Granulocytes: 0.01 10*3/uL (ref 0.00–0.07)
Basophils Absolute: 0 10*3/uL (ref 0.0–0.1)
Basophils Relative: 1 %
Eosinophils Absolute: 0 10*3/uL (ref 0.0–0.5)
Eosinophils Relative: 0 %
HCT: 29.3 % — ABNORMAL LOW (ref 39.0–52.0)
Hemoglobin: 9.3 g/dL — ABNORMAL LOW (ref 13.0–17.0)
Immature Granulocytes: 0 %
Lymphocytes Relative: 22 %
Lymphs Abs: 0.5 10*3/uL — ABNORMAL LOW (ref 0.7–4.0)
MCH: 32.5 pg (ref 26.0–34.0)
MCHC: 31.7 g/dL (ref 30.0–36.0)
MCV: 102.4 fL — ABNORMAL HIGH (ref 80.0–100.0)
Monocytes Absolute: 0.5 10*3/uL (ref 0.1–1.0)
Monocytes Relative: 19 %
Neutro Abs: 1.4 10*3/uL — ABNORMAL LOW (ref 1.7–7.7)
Neutrophils Relative %: 58 %
Platelets: 160 10*3/uL (ref 150–400)
RBC: 2.86 MIL/uL — ABNORMAL LOW (ref 4.22–5.81)
RDW: 16 % — ABNORMAL HIGH (ref 11.5–15.5)
WBC: 2.4 10*3/uL — ABNORMAL LOW (ref 4.0–10.5)
nRBC: 0 % (ref 0.0–0.2)

## 2019-06-29 LAB — COMPREHENSIVE METABOLIC PANEL
ALT: 17 U/L (ref 0–44)
AST: 22 U/L (ref 15–41)
Albumin: 3.8 g/dL (ref 3.5–5.0)
Alkaline Phosphatase: 49 U/L (ref 38–126)
Anion gap: 12 (ref 5–15)
BUN: 21 mg/dL (ref 8–23)
CO2: 25 mmol/L (ref 22–32)
Calcium: 9 mg/dL (ref 8.9–10.3)
Chloride: 101 mmol/L (ref 98–111)
Creatinine, Ser: 1.5 mg/dL — ABNORMAL HIGH (ref 0.61–1.24)
GFR calc Af Amer: 54 mL/min — ABNORMAL LOW (ref >60)
GFR calc non Af Amer: 47 mL/min — ABNORMAL LOW (ref >60)
Glucose, Bld: 88 mg/dL (ref 70–99)
Potassium: 3.7 mmol/L (ref 3.5–5.1)
Sodium: 138 mmol/L (ref 135–145)
Total Bilirubin: 0.6 mg/dL (ref 0.3–1.2)
Total Protein: 6.4 g/dL — ABNORMAL LOW (ref 6.5–8.1)

## 2019-06-29 LAB — MAGNESIUM: Magnesium: 1.3 mg/dL — ABNORMAL LOW (ref 1.7–2.4)

## 2019-06-29 MED ORDER — HEPARIN SOD (PORK) LOCK FLUSH 100 UNIT/ML IV SOLN
500.0000 [IU] | Freq: Once | INTRAVENOUS | Status: AC | PRN
  Administered 2019-06-29: 500 [IU]

## 2019-06-29 MED ORDER — SODIUM CHLORIDE 0.9 % IV SOLN
Freq: Once | INTRAVENOUS | Status: AC
  Filled 2019-06-29: qty 1000

## 2019-06-29 MED ORDER — SODIUM CHLORIDE 0.9% FLUSH
10.0000 mL | Freq: Once | INTRAVENOUS | Status: AC | PRN
  Administered 2019-06-29: 10 mL

## 2019-06-29 NOTE — Patient Instructions (Addendum)
[place] at [facility] Discharge Instructions  You were seen today by Dr. Delton Coombes. He went over your recent lab results. Please try to drink boost and Ensure to help with your nutrition, try to drink at least 5 cans of this a day. He will give you fluids today. He will see you back in 2 weeks for labs and follow up.   Thank you for choosing [place] at [facility] to provide your oncology and hematology care.  To afford each patient quality time with our provider, please arrive at least 15 minutes before your scheduled appointment time.   If you have a lab appointment with the [place] please come in thru the  Main Entrance and check in at the main information desk  You need to re-schedule your appointment should you arrive 10 or more minutes late.  We strive to give you quality time with our providers, and arriving late affects you and other patients whose appointments are after yours.  Also, if you no show three or more times for appointments you may be dismissed from the clinic at the providers discretion.     Again, thank you for choosing [facility].  Our hope is that these requests will decrease the amount of time that you wait before being seen by our physicians.       _____________________________________________________________  Should you have questions after your visit to [facility], please contact our office at [phone] between the hours of 8:00 a.m. and 4:30 p.m.  Voicemails left after 4:00 p.m. will not be returned until the following business day.  For prescription refill requests, have your pharmacy contact our office and allow 72 hours.    Cancer Center Support Programs:   > Cancer Support Group  2nd Tuesday of the month 1pm-2pm, Journey Room

## 2019-06-29 NOTE — Progress Notes (Signed)
Cory Foley, [place] [address]   CLINIC:  Medical Oncology/Hematology  PCP:  Cory Rossetti, MD [address] [place] [address] [phone]   REASON FOR VISIT:  P16 positive left tonsillar squamous cell carcinoma.  CURRENT THERAPY: Chemoradiation therapy completed.  BRIEF ONCOLOGIC HISTORY:  Oncology History  Tonsillar cancer ([place])  04/07/2019 Initial Diagnosis   Tonsillar cancer ([place])   05/08/2019 -  Chemotherapy   The patient had palonosetron (ALOXI) injection 0.25 mg, 0.25 mg, Intravenous,  Once, 6 of 7 cycles Administration: 0.25 mg (05/08/2019), 0.25 mg (05/15/2019), 0.25 mg (05/22/2019), 0.25 mg (05/29/2019), 0.25 mg (06/15/2019), 0.25 mg (06/22/2019) CISplatin (PLATINOL) 77 mg in sodium chloride 0.9 % 250 mL chemo infusion, 40 mg/m2 = 77 mg, Intravenous,  Once, 6 of 7 cycles Dose modification: 32 mg/m2 (80 % of original dose 40 mg/m2, Cycle 5, Reason: Other (see comments), Comment: cytopenia) Administration: 77 mg (05/08/2019), 77 mg (05/15/2019), 77 mg (05/22/2019), 77 mg (05/29/2019), 62 mg (06/15/2019), 77 mg (06/22/2019) fosaprepitant (EMEND) 150 mg in sodium chloride 0.9 % 145 mL IVPB, 150 mg, Intravenous,  Once, 6 of 7 cycles Administration: 150 mg (05/08/2019), 150 mg (05/15/2019), 150 mg (05/22/2019), 150 mg (05/29/2019), 150 mg (06/15/2019), 150 mg (06/22/2019)  for chemotherapy treatment.       CANCER STAGING: Cancer Staging Tonsillar cancer ([place]) Staging form: Pharynx - HPV-Mediated Oropharynx, AJCC 8th Edition - Clinical stage from 04/07/2019: Stage I (cT2, cN1, cM0, p16+) - Unsigned    INTERVAL HISTORY:  Cory Foley 69 y.o. male seen for follow-up of tonsillar cancer.  He has completed radiation therapy this Monday.  Chemotherapy last dose was given last week.  Reports no appetite.  He lost 4 pounds.  Energy levels are 25%.  Shortness of breath on exertion is stable.  Reports that he has not been able to eat any solid foods.   He has nausea but denied any vomiting.   REVIEW OF SYSTEMS:  Review of Systems  HENT:   Positive for trouble swallowing.   Respiratory: Positive for shortness of breath.   Gastrointestinal: Positive for nausea.  Neurological: Positive for dizziness.  All other systems reviewed and are negative.    PAST MEDICAL/SURGICAL HISTORY:  Past Medical History:  Diagnosis Date  . Allergy   . Anxiety   . Arthritis   . BPH with obstruction/lower urinary tract symptoms   . COPD, mild ([place])    current smoker  . ED (erectile dysfunction)   . GERD (gastroesophageal reflux disease)   . Hyperlipidemia   . Nocturia   . Osteoporosis   . PAD (peripheral artery disease) ([place])   . Port-A-Cath in place 04/28/2019  . Prostate cancer ([place]) 01/11/2012   Adenocarcinoma, Gleason=4+4=8, & 4+5=9,PSA=17.24, Volume= 15.45cc  . PVD (peripheral vascular disease) ([place])   . Restless leg syndrome   . Tongue mass    SCC   Past Surgical History:  Procedure Laterality Date  . ABDOMINAL AORTOGRAM W/LOWER EXTREMITY N/A 08/16/2018   Procedure: ABDOMINAL AORTOGRAM W/LOWER EXTREMITY;  Surgeon: Serafina Mitchell, MD;  Location: [place] [facility];  Service: Cardiovascular;  Laterality: N/A;  Bilateral  . COLONOSCOPY N/A 09/28/2012   Procedure: COLONOSCOPY;  Surgeon: Danie Binder, MD;  [facility];  Service: Endoscopy;  Laterality: N/A;  8:30  . DIRECT LARYNGOSCOPY N/A 04/03/2019   Procedure: DIRECT LARYNGOSCOPY;  Surgeon: Leta Baptist, MD;  Location: [facility];  Service: ENT;  Laterality: N/A;  .  ESOPHAGOGASTRODUODENOSCOPY N/A 09/28/2012   Procedure: ESOPHAGOGASTRODUODENOSCOPY (EGD);  Surgeon: Danie Binder, MD;  [facility];  Service: Endoscopy;  Laterality: N/A;  . Gold seed implatation  04/28/2012  . HERNIA REPAIR     Bilateral inguinal X2  . IR IMAGING GUIDED PORT INSERTION  05/03/2019  . JOINT REPLACEMENT     bilateral  . KNEE SURGERY     Left Knee X 2   and Right knee X1  .  MALONEY DILATION N/A 09/28/2012   Procedure: MALONEY DILATION;  Surgeon: Danie Binder, MD;  [facility];  Service: Endoscopy;  Laterality: N/A;  . NOSE SURGERY    . PERIPHERAL VASCULAR INTERVENTION  08/16/2018   Procedure: PERIPHERAL VASCULAR INTERVENTION;  Surgeon: Serafina Mitchell, MD;  Location: [place] [facility];  Service: Cardiovascular;;  LT Iliac  . PR VEIN BYPASS GRAFT,AORTO-FEM-POP  10/21/10   Left AK to BK popliteal BPG  . PROSTATE BIOPSY  01/11/2012   Adenocarcinoma  . PROSTATE SURGERY  2015   Chemo and  Radiation  . SAVORY DILATION N/A 09/28/2012   Procedure: SAVORY DILATION;  Surgeon: Danie Binder, MD;  [facility];  Service: Endoscopy;  Laterality: N/A;  . throat biopsy  03/20/2019  . TONSILLECTOMY Left 04/03/2019   Procedure: TONSILLECTOMY;  Surgeon: Leta Baptist, MD;  Location: [facility];  Service: ENT;  Laterality: Left;     SOCIAL HISTORY:  Social History   Socioeconomic History  . Marital status: Married    Spouse name: Cory Foley  . Number of children: 2  . Years of education: College  . 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 Smoking status: Current Every Day Smoker    Packs/day: 0.50    Years: 43.00    Pack years: 21.50    Types: Cigarettes  . Smokeless tobacco: Never Used  . Tobacco comment: 1/2 ppd  Substance and Sexual Activity  . Alcohol use: No    Comment: quit 1.5 years ago  . Drug use: No  . Sexual activity: Not Currently  Other Topics Concern  . Not on file  Social History Narrative   Patient lives at home with spouse.   Caffeine use: very little   Social Determinants of Health   Financial Resource Strain: Low Risk   . Difficulty of Paying Living Expenses: Not hard at all  Food Insecurity: No Food Insecurity  . Worried About Charity fundraiser in the Last Year: Never  true  . Ran Out of Food in the Last Year: Never true  Transportation Needs: No Transportation Needs  . Lack of Transportation (Medical): No  . Lack of Transportation (Non-Medical): No  Physical Activity: Sufficiently Active  . Days of Exercise per Week: 5 days  . Minutes of Exercise per Session: 30 min  Stress: Stress Concern Present  . Feeling of Stress : Rather much  Social Connections: Slightly Isolated  . Frequency of Communication with Friends and Family: Twice a week  . Frequency of Social Gatherings with Friends and Family: Twice a week  . Attends Religious Services: More than 4 times per year  . Active Member of Clubs or Organizations: No  . Attends Archivist Meetings: Never  . Marital Status: Married  Human resources officer Violence: Not At Risk  . Fear of Current  or Ex-Partner: No  . Emotionally Abused: No  . Physically Abused: No  . Sexually Abused: No    FAMILY HISTORY:  Family History  Problem Relation Age of Onset  . Heart disease Mother        Valve regurgitation and Pacemaker   . Diabetes Mother   . Hyperlipidemia Mother   . Heart disease Father        CABG x 5  . Hyperlipidemia Father   . Hypertension Father   . Heart attack Father   . Heart disease Sister        aortic valve replacement  . Cancer Sister 76       Colon cancer w/ metastasis  . Throat cancer Sister   . Hypertension Brother   . Heart disease Maternal Grandmother   . Heart disease Maternal Grandfather   . Heart disease Paternal Grandmother   . Cancer Paternal Grandfather   . Healthy Son   . Healthy Daughter     CURRENT MEDICATIONS:  Outpatient Encounter Medications as of 06/29/2019  Medication Sig  . aspirin EC 81 MG tablet Take 81 mg by mouth daily.  . calcium carbonate (OS-CAL) 600 MG tablet Take 1 tablet (600 mg total) by mouth 2 (two) times daily with a meal. (Patient taking differently: Take 600 mg by mouth 2 (two) times daily. )  . Cholecalciferol (VITAMIN D) 50 MCG (2000  UT) tablet Take 2,000 Units by mouth in the morning and at bedtime.  Marland Kitchen CISPLATIN IV Inject into the vein once a week.  . clopidogrel (PLAVIX) 75 MG tablet Take 75 mg by mouth daily.   . famotidine (PEPCID) 20 MG tablet TAKE 1 TABLET BY MOUTH AT BEDTIME.  . methocarbamol (ROBAXIN) 500 MG tablet Take 500 mg by mouth every 6 (six) hours as needed for muscle spasms.   . mirtazapine (REMERON) 15 MG tablet Take 1 tablet (15 mg total) by mouth at bedtime.  . pantoprazole (PROTONIX) 40 MG tablet TAKE ONE TABLET BY MOUTH DAILY. (Patient taking differently: Take 40 mg by mouth daily. )  . rOPINIRole (REQUIP) 3 MG tablet TAKE (1) TABLET BY MOUTH AT BEDTIME.  . simvastatin (ZOCOR) 40 MG tablet TAKE (1) TABLET BY MOUTH AT BEDTIME. (Patient taking differently: Take 40 mg by mouth at bedtime. )  . [DISCONTINUED] fluconazole (DIFLUCAN) 100 MG tablet Take 1 tablet (100 mg total) by mouth daily. Take 2 tablets on day 1, then take 1 tablet for 6 days  . cyclobenzaprine (FLEXERIL) 5 MG tablet Take 5 mg by mouth at bedtime as needed for muscle spasms.   Marland Kitchen HYDROcodone-acetaminophen (NORCO) 10-325 MG tablet TAKE 1 TABLET BY MOUTH EVERY FOUR HOURS AS NEEDED. (Patient not taking: Reported on 06/29/2019)  . lidocaine-prilocaine (EMLA) cream Apply a pea-sized amount to port a cath site and cover with plastic wrap 1 hour prior to appointment (Patient not taking: Reported on 06/29/2019)  . magic mouthwash w/lidocaine SOLN Take 5 mLs by mouth 6 (six) times daily. Swish and swallow. (Patient not taking: Reported on 06/29/2019)  . prochlorperazine (COMPAZINE) 10 MG tablet TAKE (1) TABLET BY MOUTH EVERY SIX HOURS AS NEEDED. (Patient not taking: Reported on 06/29/2019)  . [EXPIRED] heparin lock flush 100 unit/mL   . [EXPIRED] sodium chloride 0.9 % 1,000 mL with potassium chloride 20 mEq, magnesium sulfate 2 g infusion   . [EXPIRED] sodium chloride flush (NS) 0.9 % injection 10 mL    No facility-administered encounter medications on file  as of 06/29/2019.  ALLERGIES:  Allergies  Allergen Reactions  . Codeine Other (See Comments)    Bad headache and sweats  . Morphine And Related Itching    sweats  . Percocet [Oxycodone-Acetaminophen] Other (See Comments)    Headache and sweats     PHYSICAL EXAM:  ECOG Performance status: 1  Vitals:   06/29/19 1523 06/29/19 1556  BP: 122/64 125/69  Pulse: (!) 55 76  Resp: 18 18  Temp:  97.7 F (36.5 C)  SpO2: 100% 97%   Filed Weights   06/29/19 1308  Weight: 148 lb 4.8 oz (67.3 kg)    Physical Exam Vitals reviewed.  Constitutional:      Appearance: Normal appearance.  HENT:     Mouth/Throat:     Mouth: Mucous membranes are moist.  Cardiovascular:     Rate and Rhythm: Normal rate and regular rhythm.     Heart sounds: Normal heart sounds.  Pulmonary:     Effort: Pulmonary effort is normal.     Breath sounds: Normal breath sounds.  Abdominal:     General: There is no distension.     Palpations: Abdomen is soft.  Lymphadenopathy:     Cervical: No cervical adenopathy.  Skin:    General: Skin is warm.  Neurological:     General: No focal deficit present.     Mental Status: He is alert and oriented to person, place, and time.  Psychiatric:        Mood and Affect: Mood normal.        Behavior: Behavior normal.      LABORATORY DATA:  I have reviewed the labs as listed.  CBC    Component Value Date/Time   WBC 2.4 (L) 06/29/2019 1208   RBC 2.86 (L) 06/29/2019 1208   HGB 9.3 (L) 06/29/2019 1208   HCT 29.3 (L) 06/29/2019 1208   PLT 160 06/29/2019 1208   MCV 102.4 (H) 06/29/2019 1208   MCH 32.5 06/29/2019 1208   MCHC 31.7 06/29/2019 1208   RDW 16.0 (H) 06/29/2019 1208   LYMPHSABS 0.5 (L) 06/29/2019 1208   MONOABS 0.5 06/29/2019 1208   EOSABS 0.0 06/29/2019 1208   BASOSABS 0.0 06/29/2019 1208   CMP Latest Ref Rng & Units 06/29/2019 06/22/2019 06/12/2019  Glucose 70 - 99 mg/dL 88 93 119(H)  BUN 8 - 23 mg/dL 21 17 15   Creatinine 0.61 - 1.24 mg/dL 1.50(H)  0.93 0.94  Sodium 135 - 145 mmol/L 138 137 138  Potassium 3.5 - 5.1 mmol/L 3.7 4.0 4.2  Chloride 98 - 111 mmol/L 101 104 103  CO2 22 - 32 mmol/L 25 24 25   Calcium 8.9 - 10.3 mg/dL 9.0 8.8(L) 9.5  Total Protein 6.5 - 8.1 g/dL 6.4(L) 6.4(L) 6.5  Total Bilirubin 0.3 - 1.2 mg/dL 0.6 0.5 0.5  Alkaline Phos 38 - 126 U/L 49 49 48  AST 15 - 41 U/L 22 17 22   ALT 0 - 44 U/L 17 15 17        DIAGNOSTIC IMAGING:  I have reviewed scans.   ASSESSMENT & PLAN:   Tonsillar cancer ([place]) 1.  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 -Chemoradiation therapy with weekly cisplatin from 05/08/2019 through 06/22/2019.  XRT completed on 06/26/2019. -I have reviewed her labs.  White count is low at 2.3.  ANC is barely around 1500.  Magnesium is severely low at 1.3. -I have recommended IV fluids with electrolytes today.  His creatinine has also gone up to 1.5.  We will not give any further chemotherapy. -He was encouraged to drink lots of fluids.  I will reevaluate him in 2 weeks.  I plan to repeat PET scan in 6 weeks.  2.  Nutrition/weight loss: -He lost another 4 pounds in the last 1 week.  He reports that he cannot eat any solid foods. -I have encouraged him to drink 5 cans of boost or Ensure Plus daily.  He will not be able to eat any solid foods until at least next 4 weeks.  3.  Prostate cancer: -Gleason 9 prostate cancer, diagnosed in 2013, status post XRT from April 2014 through May 2014 with 2 years of Lupron.  PSA is normal.  4.  Renal insufficiency: -His creatinine has gone up to 1.5 from 0.9 last week. -This is from cisplatin induced nephrotoxicity. -He will receive 1 L of fluid today.  He was encouraged to drink 2 to 3 L of water daily.      Orders placed this encounter:  No orders of the defined types were placed in this  encounter.     Derek Jack, MD [place] [phone]

## 2019-06-29 NOTE — Assessment & Plan Note (Addendum)
1.  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 -Chemoradiation therapy with weekly cisplatin from 05/08/2019 through 06/22/2019.  XRT completed on 06/26/2019. -I have reviewed her labs.  White count is low at 2.3.  ANC is barely around 1500.  Magnesium is severely low at 1.3. -I have recommended IV fluids with electrolytes today.  His creatinine has also gone up to 1.5.  We will not give any further chemotherapy. -He was encouraged to drink lots of fluids.  I will reevaluate him in 2 weeks.  I plan to repeat PET scan in 6 weeks.  2.  Nutrition/weight loss: -He lost another 4 pounds in the last 1 week.  He reports that he cannot eat any solid foods. -I have encouraged him to drink 5 cans of boost or Ensure Plus daily.  He will not be able to eat any solid foods until at least next 4 weeks.  3.  Prostate cancer: -Gleason 9 prostate cancer, diagnosed in 2013, status post XRT from April 2014 through May 2014 with 2 years of Lupron.  PSA is normal.  4.  Renal insufficiency: -His creatinine has gone up to 1.5 from 0.9 last week. -This is from cisplatin induced nephrotoxicity. -He will receive 1 L of fluid today.  He was encouraged to drink 2 to 3 L of water daily.

## 2019-06-29 NOTE — Progress Notes (Signed)
To treatment area for hydration verbal order Dr. Delton Coombes.  No s/s of distress noted.    1512-patient stated he was having a "burning, sharp, stabbing pain" in the right collar bone/lower neck area that lasted five seconds per patient.  Patients port tubing seen where the patient is pointing to for discomfort.  Patient denied chest pain, SOB, jaw pain and arm pain.  No bruising or swelling noted at site.  Francene Finders, NP, called to check the patient.  No s/s of distress noted.  See flow sheet for vitals.   1514-patient stated the discomfort is gone from his neck.  No s/s of distress noted.    1520-patient denied collarbone/neck discomfort and pain.  No s/s of distress noted.   1522-Randi Lockamy, NP, in to assess the patient.  Patient instructed if the pain should occur again and continues with no relief to report to the emergency room with understanding verbalized.    Patient tolerated hydration with no complaints voiced.  Denied pain with right shoulder or neck.  Stated some "tenderness or soreness" in the back of his neck but no complaints of pain.  Port clean and dry with good blood return noted.  Band aid applied.  VSS with discharge and left ambulatory with no s/s of distress noted.

## 2019-06-30 ENCOUNTER — Inpatient Hospital Stay (HOSPITAL_COMMUNITY): Payer: Medicare Other

## 2019-06-30 ENCOUNTER — Other Ambulatory Visit (HOSPITAL_COMMUNITY): Payer: Self-pay | Admitting: *Deleted

## 2019-06-30 MED ORDER — ONDANSETRON HCL 8 MG PO TABS
8.0000 mg | ORAL_TABLET | Freq: Three times a day (TID) | ORAL | 1 refills | Status: DC | PRN
Start: 2019-06-30 — End: 2019-07-19

## 2019-06-30 NOTE — Progress Notes (Signed)
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  Patient has completed chemotherapy and radiation therapy.   Spoke with patient via phone.  Patient is struggling with eating solid foods and has not really eaten much solid foods in the last 4 1/2 weeks.  Reports that he was mostly living on ensure, coffee and water, few bites of applesauce.  Reports that over the last week he has gotten tired of the ensure and just drinking 2-3 per day.  States that he wants to eat but when tries to eat takes a few bites and then feels like he is going to throw up.  Has been taking the compazine and getting it in system 30-40 minutes before eating but it does not seem to help.  States that he has no desire to eat.  Denies pain on swallowing that is effecting eating.      Medications: compazine  Labs: creatine 1.50  Anthropometrics:   Weight 148 lb 4.8 decreased from 158 lb on 4/22, 162 lb on 4/12  170 lb 3/22 172 lb on 3/10   NUTRITION DIAGNOSIS: Predicted suboptimal energy intake continues   INTERVENTION:  Spoke with triage RN, regarding other options for nausea medication for patient to try.  RN will follow-up with NP.  Will provide another case of ensure enlive to patient for pick up next week.   Patient knows to drink 6-7 per day ideally to better meet nutritional needs.  Patient has contact information     MONITORING, EVALUATION, GOAL: Patient will consume adequate calories and protein to prevent weight loss   NEXT VISIT: May 21 phone f/u  Cornie Mccomber B. Zenia Resides, [place], [place] Registered Dietitian [phone] (pager)

## 2019-07-03 ENCOUNTER — Telehealth: Payer: Self-pay | Admitting: Family Medicine

## 2019-07-03 NOTE — Chronic Care Management (AMB) (Signed)
  Chronic Care Management   Note  07/03/2019 Name: SKYELER GOODLUCK MRN: [id number] DOB: 22-Oct-1950  [name] is a 69 y.o. year old male who is a primary care patient of Risco, Modena Nunnery, MD. I reached out to [name] by phone today in response to a referral sent by Mr. Kevonta Sprehe Luttrull's PCP, Buelah Manis, Modena Nunnery, MD.   Mr. Choat was given information about Chronic Care Management services today including:  1. CCM service includes personalized support from designated clinical staff supervised by his physician, including individualized plan of care and coordination with other care providers 2. 24/7 contact phone numbers for assistance for urgent and routine care needs. 3. Service will only be billed when office clinical staff spend 20 minutes or more in a month to coordinate care. 4. Only one practitioner may furnish and bill the service in a calendar month. 5. The patient may stop CCM services at any time (effective at the end of the month) by phone call to the office staff.   Patient agreed to services and verbal consent obtained.   Follow up plan:   [place]

## 2019-07-07 ENCOUNTER — Other Ambulatory Visit: Payer: Self-pay | Admitting: Family Medicine

## 2019-07-14 ENCOUNTER — Ambulatory Visit (HOSPITAL_COMMUNITY): Payer: Medicare Other

## 2019-07-14 NOTE — Progress Notes (Signed)
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  Patient has completed chemotherapy and radiation therapy.    Spoke with patient via phone.  Patient is eating better and more solid foods.  Portions continue to be low. Drinking 2-3 ensure/boost shakes per day as well.  Reports that taste is coming back and nausea is better.  Reports for breakfast eating 1 1/2 eggs, sausage, hashbrowns, juice.  Reports has been trying to eat dinner meal as well (casserole, barbecue, slaw and few french fries one night this week).      Medications: zofran, protonix  Labs: no new  Anthropometrics:    No new weight.  Patient says on home scale weight keeps going down.    NUTRITION DIAGNOSIS: Predicted suboptimal energy intake continues   INTERVENTION:  Encouraged patient to not get frustrated with numbers on the scale and continue to eat high calorie, high protein foods.  Overall improvement in intake from 2 weeks ago.   Patient declines needing more ensure at this time.  Patient has contact information    MONITORING, EVALUATION, GOAL: weight trends, intake   NEXT VISIT: June 11th phone f/u  Cory Foley B. Zenia Resides, [place], [place] Registered Dietitian [phone] (pager)

## 2019-07-19 ENCOUNTER — Inpatient Hospital Stay (HOSPITAL_COMMUNITY): Payer: Medicare Other

## 2019-07-19 ENCOUNTER — Inpatient Hospital Stay (HOSPITAL_COMMUNITY): Payer: Medicare Other | Admitting: Hematology

## 2019-07-19 ENCOUNTER — Other Ambulatory Visit: Payer: Self-pay

## 2019-07-19 ENCOUNTER — Encounter (HOSPITAL_COMMUNITY): Payer: Self-pay | Admitting: Hematology

## 2019-07-19 VITALS — BP 101/54 | HR 58 | Temp 96.9°F | Resp 16

## 2019-07-19 DIAGNOSIS — Z800 Family history of malignant neoplasm of digestive organs: Secondary | ICD-10-CM | POA: Diagnosis not present

## 2019-07-19 DIAGNOSIS — Z9221 Personal history of antineoplastic chemotherapy: Secondary | ICD-10-CM | POA: Diagnosis not present

## 2019-07-19 DIAGNOSIS — E612 Magnesium deficiency: Secondary | ICD-10-CM | POA: Diagnosis not present

## 2019-07-19 DIAGNOSIS — C099 Malignant neoplasm of tonsil, unspecified: Secondary | ICD-10-CM

## 2019-07-19 DIAGNOSIS — Z809 Family history of malignant neoplasm, unspecified: Secondary | ICD-10-CM | POA: Diagnosis not present

## 2019-07-19 DIAGNOSIS — Z801 Family history of malignant neoplasm of trachea, bronchus and lung: Secondary | ICD-10-CM | POA: Diagnosis not present

## 2019-07-19 DIAGNOSIS — Z923 Personal history of irradiation: Secondary | ICD-10-CM | POA: Diagnosis not present

## 2019-07-19 DIAGNOSIS — D709 Neutropenia, unspecified: Secondary | ICD-10-CM | POA: Diagnosis not present

## 2019-07-19 DIAGNOSIS — N289 Disorder of kidney and ureter, unspecified: Secondary | ICD-10-CM | POA: Diagnosis not present

## 2019-07-19 DIAGNOSIS — B370 Candidal stomatitis: Secondary | ICD-10-CM | POA: Diagnosis not present

## 2019-07-19 LAB — CBC WITH DIFFERENTIAL/PLATELET
Abs Immature Granulocytes: 0.01 10*3/uL (ref 0.00–0.07)
Basophils Absolute: 0 10*3/uL (ref 0.0–0.1)
Basophils Relative: 0 %
Eosinophils Absolute: 0.1 10*3/uL (ref 0.0–0.5)
Eosinophils Relative: 2 %
HCT: 26.6 % — ABNORMAL LOW (ref 39.0–52.0)
Hemoglobin: 8.4 g/dL — ABNORMAL LOW (ref 13.0–17.0)
Immature Granulocytes: 0 %
Lymphocytes Relative: 16 %
Lymphs Abs: 0.5 10*3/uL — ABNORMAL LOW (ref 0.7–4.0)
MCH: 33.5 pg (ref 26.0–34.0)
MCHC: 31.6 g/dL (ref 30.0–36.0)
MCV: 106 fL — ABNORMAL HIGH (ref 80.0–100.0)
Monocytes Absolute: 0.3 10*3/uL (ref 0.1–1.0)
Monocytes Relative: 8 %
Neutro Abs: 2.5 10*3/uL (ref 1.7–7.7)
Neutrophils Relative %: 74 %
Platelets: 60 10*3/uL — ABNORMAL LOW (ref 150–400)
RBC: 2.51 MIL/uL — ABNORMAL LOW (ref 4.22–5.81)
RDW: 18.4 % — ABNORMAL HIGH (ref 11.5–15.5)
WBC: 3.4 10*3/uL — ABNORMAL LOW (ref 4.0–10.5)
nRBC: 0 % (ref 0.0–0.2)

## 2019-07-19 LAB — COMPREHENSIVE METABOLIC PANEL
ALT: 10 U/L (ref 0–44)
AST: 16 U/L (ref 15–41)
Albumin: 3.4 g/dL — ABNORMAL LOW (ref 3.5–5.0)
Alkaline Phosphatase: 50 U/L (ref 38–126)
Anion gap: 8 (ref 5–15)
BUN: 13 mg/dL (ref 8–23)
CO2: 27 mmol/L (ref 22–32)
Calcium: 9.2 mg/dL (ref 8.9–10.3)
Chloride: 101 mmol/L (ref 98–111)
Creatinine, Ser: 1.17 mg/dL (ref 0.61–1.24)
GFR calc Af Amer: >60 mL/min (ref >60)
GFR calc non Af Amer: >60 mL/min (ref >60)
Glucose, Bld: 149 mg/dL — ABNORMAL HIGH (ref 70–99)
Potassium: 4.1 mmol/L (ref 3.5–5.1)
Sodium: 136 mmol/L (ref 135–145)
Total Bilirubin: 0.6 mg/dL (ref 0.3–1.2)
Total Protein: 6.2 g/dL — ABNORMAL LOW (ref 6.5–8.1)

## 2019-07-19 LAB — MAGNESIUM: Magnesium: 1.5 mg/dL — ABNORMAL LOW (ref 1.7–2.4)

## 2019-07-19 MED ORDER — FLUCONAZOLE 100 MG PO TABS
100.0000 mg | ORAL_TABLET | Freq: Every day | ORAL | 0 refills | Status: DC
Start: 2019-07-19 — End: 2019-08-18

## 2019-07-19 MED ORDER — MAGNESIUM OXIDE 400 (241.3 MG) MG PO TABS: 400.0000 mg | ORAL_TABLET | Freq: Two times a day (BID) | ORAL | 1 refills | Status: AC

## 2019-07-19 NOTE — Progress Notes (Signed)
Patient seen by Dr. Delton Coombes today. Labs reviewed. No fluids today per Dr. Delton Coombes instant message.

## 2019-07-19 NOTE — Progress Notes (Signed)
[address], [place] [address]   CLINIC:  Medical Oncology/Hematology  PCP:  Alycia Rossetti, [place] / [place] [address] [phone]   REASON FOR VISIT:  Follow-up for left tonsillar squamous cell carcinoma  PRIOR THERAPY:  1.  Chemoradiation therapy with cisplatin from 05/08/2019 through 06/26/2019.   CURRENT THERAPY: Observation.  BRIEF ONCOLOGIC HISTORY:  Oncology History  Tonsillar cancer ([place])  04/07/2019 Initial Diagnosis   Tonsillar cancer ([place])   05/08/2019 -  Chemotherapy   The patient had palonosetron (ALOXI) injection 0.25 mg, 0.25 mg, Intravenous,  Once, 6 of 7 cycles Administration: 0.25 mg (05/08/2019), 0.25 mg (05/15/2019), 0.25 mg (05/22/2019), 0.25 mg (05/29/2019), 0.25 mg (06/15/2019), 0.25 mg (06/22/2019) CISplatin (PLATINOL) 77 mg in sodium chloride 0.9 % 250 mL chemo infusion, 40 mg/m2 = 77 mg, Intravenous,  Once, 6 of 7 cycles Dose modification: 32 mg/m2 (80 % of original dose 40 mg/m2, Cycle 5, Reason: Other (see comments), Comment: cytopenia) Administration: 77 mg (05/08/2019), 77 mg (05/15/2019), 77 mg (05/22/2019), 77 mg (05/29/2019), 62 mg (06/15/2019), 77 mg (06/22/2019) fosaprepitant (EMEND) 150 mg in sodium chloride 0.9 % 145 mL IVPB, 150 mg, Intravenous,  Once, 6 of 7 cycles Administration: 150 mg (05/08/2019), 150 mg (05/15/2019), 150 mg (05/22/2019), 150 mg (05/29/2019), 150 mg (06/15/2019), 150 mg (06/22/2019)  for chemotherapy treatment.      CANCER STAGING: Cancer Staging Tonsillar cancer ([place]) Staging form: Pharynx - HPV-Mediated Oropharynx, AJCC 8th Edition - Clinical stage from 04/07/2019: Stage I (cT2, cN1, cM0, p16+) - Unsigned   INTERVAL HISTORY:  [name], a 69 y.o. male, returns for routine follow-up of his left tonsillar squamous cell carcinoma. Kairyn was last seen on 06/29/2019.   He notes that most of his taste is back, but he doesn't have much of a taste for sweet things. He has  magic mouthwash at home for thrush. He confirms that he is drinking a lot of fluid each day, including at least 6 bottles of water and 4 bottles of ensure. He notes that he doesn't eat salt.    REVIEW OF SYSTEMS:  Review of Systems  Constitutional: Positive for appetite change (mildly decreased) and fatigue (moderate).  Neurological: Positive for dizziness and headaches.  All other systems reviewed and are negative.   PAST MEDICAL/SURGICAL HISTORY:  Past Medical History:  Diagnosis Date  . Allergy   . Anxiety   . Arthritis   . BPH with obstruction/lower urinary tract symptoms   . COPD, mild ([place])    current smoker  . ED (erectile dysfunction)   . GERD (gastroesophageal reflux disease)   . Hyperlipidemia   . Nocturia   . Osteoporosis   . PAD (peripheral artery disease) ([place])   . Port-A-Cath in place 04/28/2019  . Prostate cancer ([place]) 01/11/2012   Adenocarcinoma, Gleason=4+4=8, & 4+5=9,PSA=17.24, Volume= 15.45cc  . PVD (peripheral vascular disease) ([place])   . Restless leg syndrome   . Tongue mass    SCC   Past Surgical History:  Procedure Laterality Date  . ABDOMINAL AORTOGRAM W/LOWER EXTREMITY N/A 08/16/2018   Procedure: ABDOMINAL AORTOGRAM W/LOWER EXTREMITY;  Surgeon: Serafina Mitchell, MD;  Location: [facility];  Service: Cardiovascular;  Laterality: N/A;  Bilateral  . COLONOSCOPY N/A 09/28/2012   Procedure: COLONOSCOPY;  Surgeon: Danie Binder, MD;  [facility];  Service: Endoscopy;  Laterality: N/A;  8:30  . DIRECT LARYNGOSCOPY N/A 04/03/2019   Procedure:  DIRECT LARYNGOSCOPY;  Surgeon: Leta Baptist, MD;  Location: [facility];  Service: ENT;  Laterality: N/A;  . ESOPHAGOGASTRODUODENOSCOPY N/A 09/28/2012   Procedure: ESOPHAGOGASTRODUODENOSCOPY (EGD);  Surgeon: Danie Binder, MD;  [facility];  Service: Endoscopy;  Laterality: N/A;  . Gold seed implatation  04/28/2012  . HERNIA REPAIR     Bilateral inguinal X2  . IR IMAGING GUIDED PORT  INSERTION  05/03/2019  . JOINT REPLACEMENT     bilateral  . KNEE SURGERY     Left Knee X 2   and Right knee X1  . MALONEY DILATION N/A 09/28/2012   Procedure: MALONEY DILATION;  Surgeon: Danie Binder, MD;  [facility];  Service: Endoscopy;  Laterality: N/A;  . NOSE SURGERY    . PERIPHERAL VASCULAR INTERVENTION  08/16/2018   Procedure: PERIPHERAL VASCULAR INTERVENTION;  Surgeon: Serafina Mitchell, MD;  Location: [facility];  Service: Cardiovascular;;  LT Iliac  . PR VEIN BYPASS GRAFT,AORTO-FEM-POP  10/21/10   Left AK to BK popliteal BPG  . PROSTATE BIOPSY  01/11/2012   Adenocarcinoma  . PROSTATE SURGERY  2015   Chemo and  Radiation  . SAVORY DILATION N/A 09/28/2012   Procedure: SAVORY DILATION;  Surgeon: Danie Binder, MD;  [facility];  Service: Endoscopy;  Laterality: N/A;  . throat biopsy  03/20/2019  . TONSILLECTOMY Left 04/03/2019   Procedure: TONSILLECTOMY;  Surgeon: Leta Baptist, MD;  Location: [facility];  Service: ENT;  Laterality: Left;    SOCIAL HISTORY:  Social History   Socioeconomic History  . Marital status: Married    Spouse name: Holley Raring  . Number of children: 2  . Years of education: College  . 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 Smoking status: Current Every Day Smoker    Packs/day: 0.50    Years: 43.00    Pack years: 21.50    Types: Cigarettes  . Smokeless tobacco: Never Used  . Tobacco comment: 1/2 ppd  Substance and Sexual Activity  . Alcohol use: No    Comment: quit 1.5 years ago  . Drug use: No  . Sexual activity: Not Currently  Other Topics Concern  . Not on file  Social History Narrative   Patient lives at home with spouse.   Caffeine use: very little   Social Determinants of Health   Financial Resource Strain: Low Risk   . Difficulty of Paying Living Expenses: Not  hard at all  Food Insecurity: No Food Insecurity  . Worried About Charity fundraiser in the Last Year: Never true  . Ran Out of Food in the Last Year: Never true  Transportation Needs: No Transportation Needs  . Lack of Transportation (Medical): No  . Lack of Transportation (Non-Medical): No  Physical Activity: Sufficiently Active  . Days of Exercise per Week: 5 days  . Minutes of Exercise per Session: 30 min  Stress: Stress Concern Present  . Feeling of Stress : Rather much  Social Connections: Slightly Isolated  . Frequency of Communication with Friends and Family: Twice a week  . Frequency of Social Gatherings with Friends and Family: Twice a week  . Attends Religious Services: More than 4 times per year  . Active Member of Clubs or Organizations: No  . Attends Club or  Organization Meetings: Never  . Marital Status: Married  Human resources officer Violence: Not At Risk  . Fear of Current or Ex-Partner: No  . Emotionally Abused: No  . Physically Abused: No  . Sexually Abused: No    FAMILY HISTORY:  Family History  Problem Relation Age of Onset  . Heart disease Mother        Valve regurgitation and Pacemaker   . Diabetes Mother   . Hyperlipidemia Mother   . Heart disease Father        CABG x 5  . Hyperlipidemia Father   . Hypertension Father   . Heart attack Father   . Heart disease Sister        aortic valve replacement  . Cancer Sister 15       Colon cancer w/ metastasis  . Throat cancer Sister   . Hypertension Brother   . Heart disease Maternal Grandmother   . Heart disease Maternal Grandfather   . Heart disease Paternal Grandmother   . Cancer Paternal Grandfather   . Healthy Son   . Healthy Daughter     CURRENT MEDICATIONS:  Current Outpatient Medications  Medication Sig Dispense Refill  . aspirin EC 81 MG tablet Take 81 mg by mouth daily.    . calcium carbonate (OS-CAL) 600 MG tablet Take 1 tablet (600 mg total) by mouth 2 (two) times daily with a meal.  (Patient taking differently: Take 600 mg by mouth 2 (two) times daily. ) 30 tablet   . Cholecalciferol (VITAMIN D) 50 MCG (2000 UT) tablet Take 2,000 Units by mouth in the morning and at bedtime.    Marland Kitchen CISPLATIN IV Inject into the vein once a week.    . clopidogrel (PLAVIX) 75 MG tablet Take 75 mg by mouth daily.     . cyclobenzaprine (FLEXERIL) 5 MG tablet Take 5 mg by mouth at bedtime as needed for muscle spasms.     . famotidine (PEPCID) 20 MG tablet TAKE 1 TABLET BY MOUTH AT BEDTIME. 90 tablet 0  . HYDROcodone-acetaminophen (NORCO) 10-325 MG tablet TAKE 1 TABLET BY MOUTH EVERY FOUR HOURS AS NEEDED. (Patient not taking: Reported on 06/29/2019) 120 tablet 0  . lidocaine-prilocaine (EMLA) cream Apply a pea-sized amount to port a cath site and cover with plastic wrap 1 hour prior to appointment (Patient not taking: Reported on 06/29/2019) 30 g 3  . magic mouthwash w/lidocaine SOLN Take 5 mLs by mouth 6 (six) times daily. Swish and swallow. (Patient not taking: Reported on 06/29/2019) 900 mL 2  . methocarbamol (ROBAXIN) 500 MG tablet Take 500 mg by mouth every 6 (six) hours as needed for muscle spasms.     . mirtazapine (REMERON) 15 MG tablet Take 1 tablet (15 mg total) by mouth at bedtime. 30 tablet 3  . ondansetron (ZOFRAN) 8 MG tablet Take 1 tablet (8 mg total) by mouth every 8 (eight) hours as needed for nausea or vomiting. 20 tablet 1  . pantoprazole (PROTONIX) 40 MG tablet TAKE ONE TABLET BY MOUTH DAILY. 90 tablet 0  . prochlorperazine (COMPAZINE) 10 MG tablet TAKE (1) TABLET BY MOUTH EVERY SIX HOURS AS NEEDED. (Patient not taking: Reported on 06/29/2019) 30 tablet 0  . rOPINIRole (REQUIP) 3 MG tablet TAKE (1) TABLET BY MOUTH AT BEDTIME. 30 tablet 6  . simvastatin (ZOCOR) 40 MG tablet TAKE (1) TABLET BY MOUTH AT BEDTIME. (Patient taking differently: Take 40 mg by mouth at bedtime. ) 90 tablet 2   No current facility-administered medications for  this visit.    ALLERGIES:  Allergies  Allergen  Reactions  . Codeine Other (See Comments)    Bad headache and sweats  . Morphine And Related Itching    sweats  . Percocet [Oxycodone-Acetaminophen] Other (See Comments)    Headache and sweats    PHYSICAL EXAM:  Performance status (ECOG): 1 - Symptomatic but completely ambulatory  There were no vitals filed for this visit. Wt Readings from Last 3 Encounters:  06/29/19 148 lb 4.8 oz (67.3 kg)  06/26/19 152 lb (68.9 kg)  06/22/19 154 lb 12.8 oz (70.2 kg)   Physical Exam Vitals reviewed.  Constitutional:      Appearance: Normal appearance.  HENT:     Mouth/Throat:     Comments: Thrush on tongue present. Cardiovascular:     Rate and Rhythm: Normal rate and regular rhythm.     Heart sounds: Normal heart sounds.  Pulmonary:     Effort: Pulmonary effort is normal.     Breath sounds: Normal breath sounds.  Skin:    General: Skin is warm.  Neurological:     General: No focal deficit present.     Mental Status: He is alert and oriented to person, place, and time.  Psychiatric:        Mood and Affect: Mood normal.        Behavior: Behavior normal.      LABORATORY DATA:  I have reviewed the labs as listed.  CBC Latest Ref Rng & Units 06/29/2019 06/22/2019 06/15/2019  WBC 4.0 - 10.5 K/uL 2.4(L) 2.4(L) 3.1(L)  Hemoglobin 13.0 - 17.0 g/dL 9.3(L) 10.3(L) 10.4(L)  Hematocrit 39.0 - 52.0 % 29.3(L) 31.5(L) 32.7(L)  Platelets 150 - 400 K/uL 160 154 84(L)   CMP Latest Ref Rng & Units 06/29/2019 06/22/2019 06/12/2019  Glucose 70 - 99 mg/dL 88 93 119(H)  BUN 8 - 23 mg/dL 21 17 15   Creatinine 0.61 - 1.24 mg/dL 1.50(H) 0.93 0.94  Sodium 135 - 145 mmol/L 138 137 138  Potassium 3.5 - 5.1 mmol/L 3.7 4.0 4.2  Chloride 98 - 111 mmol/L 101 104 103  CO2 22 - 32 mmol/L 25 24 25   Calcium 8.9 - 10.3 mg/dL 9.0 8.8(L) 9.5  Total Protein 6.5 - 8.1 g/dL 6.4(L) 6.4(L) 6.5  Total Bilirubin 0.3 - 1.2 mg/dL 0.6 0.5 0.5  Alkaline Phos 38 - 126 U/L 49 49 48  AST 15 - 41 U/L 22 17 22   ALT 0 - 44 U/L 17 15  17     DIAGNOSTIC IMAGING:  I have independently reviewed the scans and discussed with the patient.   ASSESSMENT:  1.  T2N1 squamous cell carcinoma the left tonsil, p16 positive: -PET scan on 03/27/2019 shows uptake in the left tonsillar fossa, 1.5 cm lymph node at the level 2 on the left, no other metastatic disease. -Chemoradiation therapy with cisplatin weekly from 05/08/2019 through 06/26/2019.  2.  Prostate cancer: -Gleason 9 prostate cancer, diagnosed in 2013, status post XRT from April 2014 through May 2014 with 2 years of Lupron.  PSA is normal.   PLAN:  1.  Left tonsillar squamous cell carcinoma: -He is feeling better.  He is able to eat well.  I have reviewed his labs.  Creatinine is 1.17.  White count is low at 3.4.  Platelet count is also low at 60.  Hemoglobin is 8.4. -Physical exam did not reveal any palpable adenopathy. -I have recommended PET scan in 4 weeks.  We will see him back after the PET  scan.  2.  Nutrition/weight loss: -He is able to eat all sorts of foods.  He has lost 1 pound from last visit.  We will closely monitor.  3.  Renal insufficiency: -He had cisplatin induced elevated creatinine.  Today it has improved to 1.17. -He was encouraged to drink 5-6 bottles of water.  4.  Oral thrush: -We will call in Diflucan daily for 10 days.  5.  Hypomagnesemia: -Magnesium today is 1.5. -He was encouraged to continue magnesium twice daily.    Orders placed this encounter:  No orders of the defined types were placed in this encounter.    Derek Jack, MD [facility] [phone]   I, Jacqualyn Posey, am acting as a scribe for Dr. Sanda Linger.  I, Derek Jack MD, have reviewed the above documentation for accuracy and completeness, and I agree with the above.

## 2019-07-19 NOTE — Patient Instructions (Signed)
[place] at [facility] Discharge Instructions  You were seen today by Dr. Delton Coombes. He went over your recent lab results. We will prescribe you some Diflucan daily for 10 days to treat for thrush. We are also sending you in a prescription for magnesium twice per day. He will see you back in for labs and follow up.   Thank you for choosing [place] at [facility] to provide your oncology and hematology care.  To afford each patient quality time with our provider, please arrive at least 15 minutes before your scheduled appointment time.   If you have a lab appointment with the [facility] please come in thru the  Main Entrance and check in at the main information desk  You need to re-schedule your appointment should you arrive 10 or more minutes late.  We strive to give you quality time with our providers, and arriving late affects you and other patients whose appointments are after yours.  Also, if you no show three or more times for appointments you may be dismissed from the clinic at the providers discretion.     Again, thank you for choosing [facility].  Our hope is that these requests will decrease the amount of time that you wait before being seen by our physicians.       _____________________________________________________________  Should you have questions after your visit to [facility], please contact our office at [phone] between the hours of 8:00 a.m. and 4:30 p.m.  Voicemails left after 4:00 p.m. will not be returned until the following business day.  For prescription refill requests, have your pharmacy contact our office and allow 72 hours.    Cancer Center Support Programs:   > Cancer Support Group  2nd Tuesday of the month 1pm-2pm, Journey Room

## 2019-07-27 ENCOUNTER — Other Ambulatory Visit: Payer: Self-pay | Admitting: Family Medicine

## 2019-07-27 ENCOUNTER — Other Ambulatory Visit (HOSPITAL_COMMUNITY): Payer: Self-pay | Admitting: *Deleted

## 2019-07-27 ENCOUNTER — Other Ambulatory Visit: Payer: Self-pay | Admitting: Surgery

## 2019-07-27 DIAGNOSIS — C61 Malignant neoplasm of prostate: Secondary | ICD-10-CM

## 2019-07-27 DIAGNOSIS — C090 Malignant neoplasm of tonsillar fossa: Secondary | ICD-10-CM | POA: Diagnosis not present

## 2019-07-27 DIAGNOSIS — Z510 Encounter for antineoplastic radiation therapy: Secondary | ICD-10-CM | POA: Diagnosis not present

## 2019-08-02 NOTE — Chronic Care Management (AMB) (Signed)
Chronic Care Management Pharmacy  Name: Cory Foley  MRN: [id number] DOB: 24-Apr-1950  Chief Complaint/ HPI  Cory Foley,  69 y.o. , male presents for their Initial CCM visit with the clinical pharmacist via telephone.  PCP : Alycia Rossetti, MD  Their chronic conditions include: GERD, osteoarthritis, hyperlipidemia, RLS, depression.  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 aspirin EC 81 MG tablet Take 81 mg by mouth daily.  . calcium carbonate (OS-CAL) 600 MG tablet Take 1 tablet (600 mg total) by mouth 2 (two) times daily with a meal. (Patient taking differently: Take 600 mg by mouth 2 (two) times daily. )  . Cholecalciferol (VITAMIN D) 50 MCG (2000 UT) tablet Take 2,000 Units by mouth in the morning and at bedtime.  Marland Kitchen CISPLATIN IV Inject into the vein once a week.  . clopidogrel (PLAVIX) 75 MG tablet TAKE 1 TABLET BY MOUTH ONCE A DAY.  . cyclobenzaprine (FLEXERIL) 5 MG tablet Take 5 mg by mouth at bedtime as needed for muscle spasms.   . famotidine (PEPCID) 20 MG tablet TAKE 1 TABLET BY MOUTH AT BEDTIME.  Marland Kitchen HYDROcodone-acetaminophen (NORCO) 10-325 MG tablet TAKE 1 TABLET BY MOUTH EVERY FOUR HOURS AS NEEDED.  . methocarbamol (ROBAXIN) 500 MG tablet Take 500 mg by mouth every 6 (six) hours as needed for muscle spasms.  . mirtazapine (REMERON) 15 MG tablet Take 1 tablet (15 mg total)  by mouth at bedtime.  . pantoprazole (PROTONIX) 40 MG tablet TAKE ONE TABLET BY MOUTH DAILY.  Marland Kitchen rOPINIRole (REQUIP) 3 MG tablet TAKE (1) TABLET BY MOUTH AT BEDTIME.  . simvastatin (ZOCOR) 40 MG tablet TAKE (1) TABLET BY MOUTH AT BEDTIME.  . fluconazole (DIFLUCAN) 100 MG tablet Take 1 tablet (100 mg total) by mouth daily. (Patient not taking: Reported on 08/07/2019)  . lidocaine-prilocaine (EMLA) cream Apply a pea-sized amount to port a cath site and cover with plastic wrap 1 hour prior to appointment (Patient not taking: Reported on 08/07/2019)  . magnesium oxide (MAG-OX) 400 (241.3 Mg) MG tablet Take 1 tablet (400 mg total) by mouth 2 (two) times daily. (Patient not taking: Reported on 08/07/2019)   No facility-administered encounter medications on file as of 08/07/2019.     Current Diagnosis/Assessment:    Emergency planning/management officer Strain: Low Risk   . Difficulty of Paying Living Expenses: Not hard at all     Goals Addressed            This Visit's Progress   . Pharmacy Care Plan:       CARE PLAN ENTRY  Current Barriers:  . Chronic Disease Management support, education, and care coordination needs related to Hyperlipidemia, Osteoarthritis, and restless leg syndrome   Hyperlipidemia . Pharmacist Clinical Goal(s): o Over the next 180 days, patient will work with PharmD and providers to achieve LDL goal < 100 . Current regimen:  o Simvastatin 40mg  daily . Interventions: o Counseled on LDL dietary recommendations to  bring down . Patient self care activities - Over the next 180 days, patient will: o Focus on medication adherence by pill box o Continue to take medication as prescribed o Work to consume foods low in fats and limit processed foods  Restless Leg Syndrome . Pharmacist Clinical Goal(s) o Over the next 180 days, patient will work with PharmD and providers to optimize medication and minimize symptoms related to RLS. . Current regimen:  o Ropinirole 3mg  at  bedtime . Interventions: o Comprehensive medication review o Continue current therapy . Patient self care activities - Over the next 180 days, patient will: o Continue to focus on medication adherence by pill box o Contact PharmD or PCP with any medication related concerns or increase in symptoms  Osteoarthritis . Pharmacist Clinical Goal(s) o Over the next 180 days, patient will work with PharmD and providers to optimize medication and minimize symptoms related to osteoarthritis. . Current regimen:  o Hydrocodon/APA 10-325mg  every 4 hours as needed . Interventions: o Comprehensive medication review o Continue current therapy o Evaluated current pain levels . Patient self care activities - Over the next 180 days, patient will: o Continue to focus on medication adherence by pill box o Contact PharmD or PCP with any medication related concerns or increase in symptoms  Initial goal documentation       Osteoarthritis    Patient has failed these meds in past: none noted Patient is currently controlled on the following medications: Hydrocodone-APAP 10-325 mg q4h prn  Currently pain is controlled.  No concerns per patient.  Plan  Continue current medications  Hyperlipidemia   Lipid Panel     Component Value Date/Time   CHOL 174 12/26/2018 1015   TRIG 131 12/26/2018 1015   HDL 42 12/26/2018 1015   LDLCALC 107 (H) 12/26/2018 1015     The 10-year ASCVD risk score Mikey Bussing DC Jr., et al., 2013) is: 21.8%   Values used to calculate the score:     Age: 26 years     Sex: Male     Is Non-Hispanic African American: No     Diabetic: No     Tobacco smoker: Yes     Systolic Blood Pressure: 673 mmHg     Is BP treated: No     HDL Cholesterol: 42 mg/dL     Total Cholesterol: 174 mg/dL   Patient has failed these meds in past: none noted Patient is currently uncontrolled based on most recent LDL on the following medications:  . Simvastatin 40mg  daily  Currently trying to get  back to eating regular foods after surgery.  Was living off of mainly Ensure during recovery.  Reports appetite slowly returning and he is making good dietary choices.  Discussed importance of controlling LDL.  Plan  Continue current medications.  Recheck lipids at next follow up visit  RLS    Patient has failed these meds in past: ropinorole 0.5mg -2mg  Patient is currently controlled on the following medications: ropinirol 3mg  hs  Currently controlled, admits legs to start getting restless at night once he settles down, but calms a few hours after taking medication.  Plan  Continue current medications.  Depression    Patient has failed these meds in past: none noted Patient is currently controlled on the following medications: no medications currently  Reports stable mood lately with no concerns.  Plan  Continue current medications GERD    Patient has failed these meds in past: none noted Patient is currently controlled on the following  medications: famitidine 20mg , pantoprazole 40mg   Controlled, no acid reflux symptoms.  Plan  Continue current medications    Vaccines   Reviewed and discussed patient's vaccination history.    Immunization History  Administered Date(s) Administered  . Fluad Quad(high Dose 65+) 12/26/2018  . Influenza, High Dose Seasonal PF 01/25/2018  . Influenza,inj,Quad PF,6+ Mos 11/11/2015, 11/16/2016  . Pneumococcal Conjugate-13 07/14/2016  . Pneumococcal Polysaccharide-23 06/07/2013  . Td 02/23/2005  . Zoster Recombinat (Shingrix) 07/14/2016    Plan  Recommended patient receive shingles vaccine in office/pharmacy.   Medication Management   . Miscellaneous medications: methocarbamol 500mg , cyclobenzaprine 5mg , clopidogrel 75mg  . OTC's: magnesium oxide 400mg , vitamin d 44mcg, ASA 81mg , Os-cal 600mg  . Patient currently uses [facility].  Phone #  [phone] . Patient reports using pill box method to organize  medications and promote adherence. . Patient reports/denies missed doses of medication.   Beverly Milch, PharmD Clinical Pharmacist [place] [phone]

## 2019-08-04 ENCOUNTER — Ambulatory Visit (HOSPITAL_COMMUNITY): Payer: Medicare Other

## 2019-08-04 NOTE — Progress Notes (Signed)
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  Patient has completed chemotherapy and radiation therapy.    Spoke with patient via phone for nutrition follow-up.  Patient reports that his taste has come back and he can tolerate all consistencies of foods.  Frustrated that weight is not coming back.  States that he is eating 2-3 good meals per day and drinking about 2 ensure per day.  Has started eaten few more sweets recently.   Reports that portion sizes are smaller than before as feels full quicker.      Medications: reviewed  Labs: reviewed  Anthropometrics:   Weight 148 lb on 5/6 decreased from 158 lb on 4/22 172 lb 3/10  NUTRITION DIAGNOSIS: Predicted suboptimal energy intake continues   INTERVENTION:  Discussed ways to add calories and protein in diet.  Encouraged keeping log of calories eaten during the day and compare to goal.  Patient has set goal to get to 153 lb by next phone call with RD.   Patient has contact information    MONITORING, EVALUATION, GOAL: weight trends, intake   NEXT VISIT: July 2nd, phone f/u  Cory Foley, [place], [place] Registered Dietitian [phone] (pager)

## 2019-08-07 ENCOUNTER — Ambulatory Visit: Payer: Medicare Other | Admitting: Pharmacist

## 2019-08-07 ENCOUNTER — Other Ambulatory Visit: Payer: Self-pay

## 2019-08-07 DIAGNOSIS — M1991 Primary osteoarthritis, unspecified site: Secondary | ICD-10-CM

## 2019-08-07 DIAGNOSIS — G2581 Restless legs syndrome: Secondary | ICD-10-CM

## 2019-08-07 DIAGNOSIS — E7800 Pure hypercholesterolemia, unspecified: Secondary | ICD-10-CM

## 2019-08-07 NOTE — Patient Instructions (Addendum)
Visit Information Thank you for meeting with me today!  I look forward to working with you to help you meet all of your healthcare goals and answer any questions you may have.  Feel free to contact me anytime!  Goals Addressed            This Visit's Progress   . Pharmacy Care Plan:       CARE PLAN ENTRY  Current Barriers:  . Chronic Disease Management support, education, and care coordination needs related to Hyperlipidemia, Osteoarthritis, and restless leg syndrome   Hyperlipidemia . Pharmacist Clinical Goal(s): o Over the next 180 days, patient will work with PharmD and providers to achieve LDL goal < 100 . Current regimen:  o Simvastatin 40mg  daily . Interventions: o Counseled on LDL dietary recommendations to bring down . Patient self care activities - Over the next 180 days, patient will: o Focus on medication adherence by pill box o Continue to take medication as prescribed o Work to consume foods low in fats and limit processed foods  Restless Leg Syndrome . Pharmacist Clinical Goal(s) o Over the next 180 days, patient will work with PharmD and providers to optimize medication and minimize symptoms related to RLS. . Current regimen:  o Ropinirole 3mg  at bedtime . Interventions: o Comprehensive medication review o Continue current therapy . Patient self care activities - Over the next 180 days, patient will: o Continue to focus on medication adherence by pill box o Contact PharmD or PCP with any medication related concerns or increase in symptoms  Osteoarthritis . Pharmacist Clinical Goal(s) o Over the next 180 days, patient will work with PharmD and providers to optimize medication and minimize symptoms related to osteoarthritis. . Current regimen:  o Hydrocodon/APA 10-325mg  every 4 hours as needed . Interventions: o Comprehensive medication review o Continue current therapy o Evaluated current pain levels . Patient self care activities - Over the next 180  days, patient will: o Continue to focus on medication adherence by pill box o Contact PharmD or PCP with any medication related concerns or increase in symptoms  Initial goal documentation        Cory Foley was given information about Chronic Care Management services today including:  1. CCM service includes personalized support from designated clinical staff supervised by his physician, including individualized plan of care and coordination with other care providers 2. 24/7 contact phone numbers for assistance for urgent and routine care needs. 3. Standard insurance, coinsurance, copays and deductibles apply for chronic care management only during months in which we provide at least 20 minutes of these services. Most insurances cover these services at 100%, however patients may be responsible for any copay, coinsurance and/or deductible if applicable. This service may help you avoid the need for more expensive face-to-face services. 4. Only one practitioner may furnish and bill the service in a calendar month. 5. The patient may stop CCM services at any time (effective at the end of the month) by phone call to the office staff.  Patient agreed to services and verbal consent obtained.   The patient verbalized understanding of instructions provided today and agreed to receive a mailed copy of patient instruction and/or educational materials. Telephone follow up appointment with pharmacy team member scheduled for: 02/13/2020 @ 12:30 PM  Beverly Milch, PharmD Clinical Pharmacist [facility] [phone]   Dyslipidemia Dyslipidemia is an imbalance of waxy, fat-like substances (lipids) in the blood. The body needs lipids in small amounts. Dyslipidemia often involves  a high level of cholesterol or triglycerides, which are types of lipids. Common forms of dyslipidemia include:  High levels of LDL cholesterol. LDL is the type of cholesterol that causes fatty deposits  (plaques) to build up in the blood vessels that carry blood away from your heart (arteries).  Low levels of HDL cholesterol. HDL cholesterol is the type of cholesterol that protects against heart disease. High levels of HDL remove the LDL buildup from arteries.  High levels of triglycerides. Triglycerides are a fatty substance in the blood that is linked to a buildup of plaques in the arteries. What are the causes? Primary dyslipidemia is caused by changes (mutations) in genes that are passed down through families (inherited). These mutations cause several types of dyslipidemia. Secondary dyslipidemia is caused by lifestyle choices and diseases that lead to dyslipidemia, such as:  Eating a diet that is high in animal fat.  Not getting enough exercise.  Having diabetes, kidney disease, liver disease, or thyroid disease.  Drinking large amounts of alcohol.  Using certain medicines. What increases the risk? You are more likely to develop this condition if you are an older man or if you are a woman who has gone through menopause. Other risk factors include:  Having a family history of dyslipidemia.  Taking certain medicines, including birth control pills, steroids, some diuretics, and beta-blockers.  Smoking cigarettes.  Eating a high-fat diet.  Having certain medical conditions such as diabetes, polycystic ovary syndrome (PCOS), kidney disease, liver disease, or hypothyroidism.  Not exercising regularly.  Being overweight or obese with too much belly fat. What are the signs or symptoms? In most cases, dyslipidemia does not usually cause any symptoms. In severe cases, very high lipid levels can cause:  Fatty bumps under the skin (xanthomas).  White or gray ring around the black center (pupil) of the eye. Very high triglyceride levels can cause inflammation of the pancreas (pancreatitis). How is this diagnosed? Your health care provider may diagnose dyslipidemia based on a  routine blood test (fasting blood test). Because most people do not have symptoms of the condition, this blood testing (lipid profile) is done on adults age 9 and older and is repeated every 5 years. This test checks:  Total cholesterol. This measures the total amount of cholesterol in your blood, including LDL cholesterol, HDL cholesterol, and triglycerides. A healthy number is below 200.  LDL cholesterol. The target number for LDL cholesterol is different for each person, depending on individual risk factors. Ask your health care provider what your LDL cholesterol should be.  HDL cholesterol. An HDL level of 60 or higher is best because it helps to protect against heart disease. A number below 39 for men or below 7 for women increases the risk for heart disease.  Triglycerides. A healthy triglyceride number is below 150. If your lipid profile is abnormal, your health care provider may do other blood tests. How is this treated? Treatment depends on the type of dyslipidemia that you have and your other risk factors for heart disease and stroke. Your health care provider will have a target range for your lipid levels based on this information. For many people, this condition may be treated by lifestyle changes, such as diet and exercise. Your health care provider may recommend that you:  Get regular exercise.  Make changes to your diet.  Quit smoking if you smoke. If diet changes and exercise do not help you reach your goals, your health care provider may also prescribe medicine to  lower lipids. The most commonly prescribed type of medicine lowers your LDL cholesterol (statin drug). If you have a high triglyceride level, your provider may prescribe another type of drug (fibrate) or an omega-3 fish oil supplement, or both. Follow these instructions at home:  Eating and drinking  Follow instructions from your health care provider or dietitian about eating or drinking restrictions.  Eat a  healthy diet as told by your health care provider. This can help you reach and maintain a healthy weight, lower your LDL cholesterol, and raise your HDL cholesterol. This may include: ? Limiting your calories, if you are overweight. ? Eating more fruits, vegetables, whole grains, fish, and lean meats. ? Limiting saturated fat, trans fat, and cholesterol.  If you drink alcohol: ? Limit how much you use. ? Be aware of how much alcohol is in your drink. In the [place] one drink equals one 12 oz bottle of beer (355 mL), one 5 oz glass of wine (148 mL), or one 1 oz glass of hard liquor (44 mL).  Do not drink alcohol if: ? Your health care provider tells you not to drink. ? You are pregnant, may be pregnant, or are planning to become pregnant. Activity  Get regular exercise. Start an exercise and strength training program as told by your health care provider. Ask your health care provider what activities are safe for you. Your health care provider may recommend: ? 30 minutes of aerobic activity 4-6 days a week. Brisk walking is an example of aerobic activity. ? Strength training 2 days a week. General instructions  Do not use any products that contain nicotine or tobacco, such as cigarettes, e-cigarettes, and chewing tobacco. If you need help quitting, ask your health care provider.  Take over-the-counter and prescription medicines only as told by your health care provider. This includes supplements.  Keep all follow-up visits as told by your health care provider. Contact a health care provider if:  You are: ? Having trouble sticking to your exercise or diet plan. ? Struggling to quit smoking or control your use of alcohol. Summary  Dyslipidemia often involves a high level of cholesterol or triglycerides, which are types of lipids.  Treatment depends on the type of dyslipidemia that you have and your other risk factors for heart disease and stroke.  For many people, treatment starts with  lifestyle changes, such as diet and exercise.  Your health care provider may prescribe medicine to lower lipids. This information is not intended to replace advice given to you by your health care provider. Make sure you discuss any questions you have with your health care provider. Document Revised: 10/04/2017 Document Reviewed: 09/10/2017 Elsevier Patient Education  [place].

## 2019-08-08 ENCOUNTER — Other Ambulatory Visit: Payer: Self-pay | Admitting: Family Medicine

## 2019-08-08 DIAGNOSIS — E7800 Pure hypercholesterolemia, unspecified: Secondary | ICD-10-CM

## 2019-08-08 DIAGNOSIS — I739 Peripheral vascular disease, unspecified: Secondary | ICD-10-CM

## 2019-08-08 DIAGNOSIS — J42 Unspecified chronic bronchitis: Secondary | ICD-10-CM

## 2019-08-14 ENCOUNTER — Encounter (HOSPITAL_COMMUNITY)
Admission: RE | Admit: 2019-08-14 | Discharge: 2019-08-14 | Disposition: A | Discharge status: Home / Self Care | Payer: Medicare Other | Source: Ambulatory Visit | Attending: Hematology | Admitting: Hematology

## 2019-08-14 ENCOUNTER — Other Ambulatory Visit: Payer: Self-pay

## 2019-08-14 ENCOUNTER — Inpatient Hospital Stay (HOSPITAL_COMMUNITY): Payer: Medicare Other | Attending: Hematology

## 2019-08-14 DIAGNOSIS — C099 Malignant neoplasm of tonsil, unspecified: Secondary | ICD-10-CM

## 2019-08-14 DIAGNOSIS — Z808 Family history of malignant neoplasm of other organs or systems: Secondary | ICD-10-CM | POA: Insufficient documentation

## 2019-08-14 DIAGNOSIS — Z833 Family history of diabetes mellitus: Secondary | ICD-10-CM | POA: Diagnosis not present

## 2019-08-14 DIAGNOSIS — J449 Chronic obstructive pulmonary disease, unspecified: Secondary | ICD-10-CM | POA: Insufficient documentation

## 2019-08-14 DIAGNOSIS — E785 Hyperlipidemia, unspecified: Secondary | ICD-10-CM | POA: Insufficient documentation

## 2019-08-14 DIAGNOSIS — G2581 Restless legs syndrome: Secondary | ICD-10-CM | POA: Diagnosis not present

## 2019-08-14 DIAGNOSIS — Z79899 Other long term (current) drug therapy: Secondary | ICD-10-CM | POA: Diagnosis not present

## 2019-08-14 DIAGNOSIS — F17210 Nicotine dependence, cigarettes, uncomplicated: Secondary | ICD-10-CM | POA: Diagnosis not present

## 2019-08-14 DIAGNOSIS — I739 Peripheral vascular disease, unspecified: Secondary | ICD-10-CM | POA: Insufficient documentation

## 2019-08-14 DIAGNOSIS — Z800 Family history of malignant neoplasm of digestive organs: Secondary | ICD-10-CM | POA: Diagnosis not present

## 2019-08-14 DIAGNOSIS — Z8249 Family history of ischemic heart disease and other diseases of the circulatory system: Secondary | ICD-10-CM | POA: Diagnosis not present

## 2019-08-14 DIAGNOSIS — C760 Malignant neoplasm of head, face and neck: Secondary | ICD-10-CM | POA: Diagnosis not present

## 2019-08-14 DIAGNOSIS — C61 Malignant neoplasm of prostate: Secondary | ICD-10-CM

## 2019-08-14 DIAGNOSIS — Z8546 Personal history of malignant neoplasm of prostate: Secondary | ICD-10-CM | POA: Insufficient documentation

## 2019-08-14 DIAGNOSIS — Z83438 Family history of other disorder of lipoprotein metabolism and other lipidemia: Secondary | ICD-10-CM | POA: Insufficient documentation

## 2019-08-14 DIAGNOSIS — R42 Dizziness and giddiness: Secondary | ICD-10-CM | POA: Diagnosis not present

## 2019-08-14 DIAGNOSIS — R519 Headache, unspecified: Secondary | ICD-10-CM | POA: Diagnosis not present

## 2019-08-14 LAB — CBC WITH DIFFERENTIAL/PLATELET
Abs Immature Granulocytes: 0.01 10*3/uL (ref 0.00–0.07)
Basophils Absolute: 0 10*3/uL (ref 0.0–0.1)
Basophils Relative: 0 %
Eosinophils Absolute: 0 10*3/uL (ref 0.0–0.5)
Eosinophils Relative: 1 %
HCT: 26.3 % — ABNORMAL LOW (ref 39.0–52.0)
Hemoglobin: 8.3 g/dL — ABNORMAL LOW (ref 13.0–17.0)
Immature Granulocytes: 0 %
Lymphocytes Relative: 24 %
Lymphs Abs: 0.8 10*3/uL (ref 0.7–4.0)
MCH: 35 pg — ABNORMAL HIGH (ref 26.0–34.0)
MCHC: 31.6 g/dL (ref 30.0–36.0)
MCV: 111 fL — ABNORMAL HIGH (ref 80.0–100.0)
Monocytes Absolute: 0.4 10*3/uL (ref 0.1–1.0)
Monocytes Relative: 12 %
Neutro Abs: 2.1 10*3/uL (ref 1.7–7.7)
Neutrophils Relative %: 63 %
Platelets: 201 10*3/uL (ref 150–400)
RBC: 2.37 MIL/uL — ABNORMAL LOW (ref 4.22–5.81)
RDW: 21.1 % — ABNORMAL HIGH (ref 11.5–15.5)
WBC: 3.4 10*3/uL — ABNORMAL LOW (ref 4.0–10.5)
nRBC: 0 % (ref 0.0–0.2)

## 2019-08-14 LAB — COMPREHENSIVE METABOLIC PANEL
ALT: 10 U/L (ref 0–44)
AST: 14 U/L — ABNORMAL LOW (ref 15–41)
Albumin: 3.7 g/dL (ref 3.5–5.0)
Alkaline Phosphatase: 46 U/L (ref 38–126)
Anion gap: 10 (ref 5–15)
BUN: 15 mg/dL (ref 8–23)
CO2: 23 mmol/L (ref 22–32)
Calcium: 9 mg/dL (ref 8.9–10.3)
Chloride: 104 mmol/L (ref 98–111)
Creatinine, Ser: 1.11 mg/dL (ref 0.61–1.24)
GFR calc Af Amer: >60 mL/min (ref >60)
GFR calc non Af Amer: >60 mL/min (ref >60)
Glucose, Bld: 92 mg/dL (ref 70–99)
Potassium: 3.8 mmol/L (ref 3.5–5.1)
Sodium: 137 mmol/L (ref 135–145)
Total Bilirubin: 0.5 mg/dL (ref 0.3–1.2)
Total Protein: 6.3 g/dL — ABNORMAL LOW (ref 6.5–8.1)

## 2019-08-14 LAB — MAGNESIUM: Magnesium: 1.7 mg/dL (ref 1.7–2.4)

## 2019-08-14 LAB — PSA: Prostatic Specific Antigen: <0.01 ng/mL (ref 0.00–4.00)

## 2019-08-14 MED ORDER — FLUDEOXYGLUCOSE F - 18 (FDG) INJECTION
8.8000 | Freq: Once | INTRAVENOUS | Status: AC | PRN
  Administered 2019-08-14: 8.8 via INTRAVENOUS

## 2019-08-16 ENCOUNTER — Ambulatory Visit (HOSPITAL_COMMUNITY): Payer: Medicare Other | Admitting: Oncology

## 2019-08-16 ENCOUNTER — Other Ambulatory Visit: Payer: Self-pay

## 2019-08-16 ENCOUNTER — Inpatient Hospital Stay (HOSPITAL_COMMUNITY): Payer: Medicare Other | Admitting: Nurse Practitioner

## 2019-08-16 VITALS — BP 101/69 | HR 73 | Temp 98.5°F | Resp 20 | Wt 141.3 lb

## 2019-08-16 DIAGNOSIS — J189 Pneumonia, unspecified organism: Secondary | ICD-10-CM

## 2019-08-16 DIAGNOSIS — E785 Hyperlipidemia, unspecified: Secondary | ICD-10-CM | POA: Diagnosis not present

## 2019-08-16 DIAGNOSIS — Z79899 Other long term (current) drug therapy: Secondary | ICD-10-CM | POA: Diagnosis not present

## 2019-08-16 DIAGNOSIS — Z8249 Family history of ischemic heart disease and other diseases of the circulatory system: Secondary | ICD-10-CM | POA: Diagnosis not present

## 2019-08-16 DIAGNOSIS — I739 Peripheral vascular disease, unspecified: Secondary | ICD-10-CM | POA: Diagnosis not present

## 2019-08-16 DIAGNOSIS — C099 Malignant neoplasm of tonsil, unspecified: Secondary | ICD-10-CM | POA: Diagnosis not present

## 2019-08-16 DIAGNOSIS — J449 Chronic obstructive pulmonary disease, unspecified: Secondary | ICD-10-CM | POA: Diagnosis not present

## 2019-08-16 DIAGNOSIS — G2581 Restless legs syndrome: Secondary | ICD-10-CM | POA: Diagnosis not present

## 2019-08-16 DIAGNOSIS — Z800 Family history of malignant neoplasm of digestive organs: Secondary | ICD-10-CM | POA: Diagnosis not present

## 2019-08-16 DIAGNOSIS — Z83438 Family history of other disorder of lipoprotein metabolism and other lipidemia: Secondary | ICD-10-CM | POA: Diagnosis not present

## 2019-08-16 DIAGNOSIS — Z833 Family history of diabetes mellitus: Secondary | ICD-10-CM | POA: Diagnosis not present

## 2019-08-16 DIAGNOSIS — R519 Headache, unspecified: Secondary | ICD-10-CM | POA: Diagnosis not present

## 2019-08-16 DIAGNOSIS — Z808 Family history of malignant neoplasm of other organs or systems: Secondary | ICD-10-CM | POA: Diagnosis not present

## 2019-08-16 DIAGNOSIS — R42 Dizziness and giddiness: Secondary | ICD-10-CM | POA: Diagnosis not present

## 2019-08-16 MED ORDER — LEVOFLOXACIN 250 MG PO TABS: 500.0000 mg | ORAL_TABLET | Freq: Every day | ORAL | 0 refills | Status: DC

## 2019-08-16 NOTE — Progress Notes (Signed)
Cory Foley, [place] [address]   CLINIC:  Medical Oncology/Hematology  PCP:  Alycia Rossetti, MD [address] [place] [address] [phone]   REASON FOR VISIT: Follow-up for tonsillar cancer   CURRENT THERAPY: Observation  BRIEF ONCOLOGIC HISTORY:  Oncology History  Tonsillar cancer ([name])  04/07/2019 Initial Diagnosis   Tonsillar cancer ([name])   05/08/2019 -  Chemotherapy   The patient had palonosetron (ALOXI) injection 0.25 mg, 0.25 mg, Intravenous,  Once, 6 of 7 cycles Administration: 0.25 mg (05/08/2019), 0.25 mg (05/15/2019), 0.25 mg (05/22/2019), 0.25 mg (05/29/2019), 0.25 mg (06/15/2019), 0.25 mg (06/22/2019) CISplatin (PLATINOL) 77 mg in sodium chloride 0.9 % 250 mL chemo infusion, 40 mg/m2 = 77 mg, Intravenous,  Once, 6 of 7 cycles Dose modification: 32 mg/m2 (80 % of original dose 40 mg/m2, Cycle 5, Reason: Other (see comments), Comment: cytopenia) Administration: 77 mg (05/08/2019), 77 mg (05/15/2019), 77 mg (05/22/2019), 77 mg (05/29/2019), 62 mg (06/15/2019), 77 mg (06/22/2019) fosaprepitant (EMEND) 150 mg in sodium chloride 0.9 % 145 mL IVPB, 150 mg, Intravenous,  Once, 6 of 7 cycles Administration: 150 mg (05/08/2019), 150 mg (05/15/2019), 150 mg (05/22/2019), 150 mg (05/29/2019), 150 mg (06/15/2019), 150 mg (06/22/2019)  for chemotherapy treatment.      CANCER STAGING: Cancer Staging Tonsillar cancer ([name]) Staging form: Pharynx - HPV-Mediated Oropharynx, AJCC 8th Edition - Clinical stage from 04/07/2019: Stage I (cT2, cN1, cM0, p16+) - Unsigned    INTERVAL HISTORY:  Cory Foley 69 y.o. male returns for routine follow-up for tonsillar cancer.  Patient reports he is doing well since his last visit.  He reports that his taste has returned and he is starting to eat better.  He is still drinking Ensure daily.  His swallowing has improved. Denies any nausea, vomiting, or diarrhea. Denies any new pains. Had not noticed any recent bleeding  such as epistaxis, hematuria or hematochezia. Denies recent chest pain on exertion, shortness of breath on minimal exertion, pre-syncopal episodes, or palpitations. Denies any numbness or tingling in hands or feet. Denies any recent fevers, infections, or recent hospitalizations. Patient reports appetite at 75% and energy level at 50%.    REVIEW OF SYSTEMS:  Review of Systems  Neurological: Positive for dizziness and headaches.  All other systems reviewed and are negative.    PAST MEDICAL/SURGICAL HISTORY:  Past Medical History:  Diagnosis Date  . Allergy   . Anxiety   . Arthritis   . BPH with obstruction/lower urinary tract symptoms   . COPD, mild ([facility])    current smoker  . ED (erectile dysfunction)   . GERD (gastroesophageal reflux disease)   . Hyperlipidemia   . Nocturia   . Osteoporosis   . PAD (peripheral artery disease) ([facility])   . Port-A-Cath in place 04/28/2019  . Prostate cancer ([facility]) 01/11/2012   Adenocarcinoma, Gleason=4+4=8, & 4+5=9,PSA=17.24, Volume= 15.45cc  . PVD (peripheral vascular disease) ([facility])   . Restless leg syndrome   . Tongue mass    SCC   Past Surgical History:  Procedure Laterality Date  . ABDOMINAL AORTOGRAM W/LOWER EXTREMITY N/A 08/16/2018   Procedure: ABDOMINAL AORTOGRAM W/LOWER EXTREMITY;  Surgeon: Serafina Mitchell, MD;  Location: [facility];  Service: Cardiovascular;  Laterality: N/A;  Bilateral  . COLONOSCOPY N/A 09/28/2012   Procedure: COLONOSCOPY;  Surgeon: Danie Binder, MD;  [facility];  Service: Endoscopy;  Laterality: N/A;  8:30  . DIRECT LARYNGOSCOPY N/A 04/03/2019  Procedure: DIRECT LARYNGOSCOPY;  Surgeon: Leta Baptist, MD;  Location: [facility];  Service: ENT;  Laterality: N/A;  . ESOPHAGOGASTRODUODENOSCOPY N/A 09/28/2012   Procedure: ESOPHAGOGASTRODUODENOSCOPY (EGD);  Surgeon: Danie Binder, MD;  [facility];  Service: Endoscopy;  Laterality: N/A;  . Gold seed implatation  04/28/2012  . HERNIA  REPAIR     Bilateral inguinal X2  . IR IMAGING GUIDED PORT INSERTION  05/03/2019  . JOINT REPLACEMENT     bilateral  . KNEE SURGERY     Left Knee X 2   and Right knee X1  . MALONEY DILATION N/A 09/28/2012   Procedure: MALONEY DILATION;  Surgeon: Danie Binder, MD;  [facility];  Service: Endoscopy;  Laterality: N/A;  . NOSE SURGERY    . PERIPHERAL VASCULAR INTERVENTION  08/16/2018   Procedure: PERIPHERAL VASCULAR INTERVENTION;  Surgeon: Serafina Mitchell, MD;  Location: [facility];  Service: Cardiovascular;;  LT Iliac  . PR VEIN BYPASS GRAFT,AORTO-FEM-POP  10/21/10   Left AK to BK popliteal BPG  . PROSTATE BIOPSY  01/11/2012   Adenocarcinoma  . PROSTATE SURGERY  2015   Chemo and  Radiation  . SAVORY DILATION N/A 09/28/2012   Procedure: SAVORY DILATION;  Surgeon: Danie Binder, MD;  [facility];  Service: Endoscopy;  Laterality: N/A;  . throat biopsy  03/20/2019  . TONSILLECTOMY Left 04/03/2019   Procedure: TONSILLECTOMY;  Surgeon: Leta Baptist, MD;  Location: [facility];  Service: ENT;  Laterality: Left;     SOCIAL HISTORY:  Social History   Socioeconomic History  . Marital status: Married    Spouse name: Holley Raring  . Number of children: 2  . Years of education: College  . 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 Smoking status: Current Every Day Smoker    Packs/day: 0.50    Years: 43.00    Pack years: 21.50    Types: Cigarettes  . Smokeless tobacco: Never Used  . Tobacco comment: 1/2 ppd  Substance and Sexual Activity  . Alcohol use: No    Comment: quit 1.5 years ago  . Drug use: No  . Sexual activity: Not Currently  Other Topics Concern  . Not on file  Social History Narrative   Patient lives at home with spouse.   Caffeine use: very little   Social Determinants of Health   Financial Resource  Strain: Low Risk   . Difficulty of Paying Living Expenses: Not hard at all  Food Insecurity: No Food Insecurity  . Worried About Charity fundraiser in the Last Year: Never true  . Ran Out of Food in the Last Year: Never true  Transportation Needs: No Transportation Needs  . Lack of Transportation (Medical): No  . Lack of Transportation (Non-Medical): No  Physical Activity: Sufficiently Active  . Days of Exercise per Week: 5 days  . Minutes of Exercise per Session: 30 min  Stress: Stress Concern Present  . Feeling of Stress : Rather much  Social Connections: Moderately Integrated  . Frequency of Communication with Friends and Family: Twice a week  . Frequency of Social Gatherings with Friends and Family: Twice a week  . Attends Religious Services: More than 4 times per year  . Active Member of Clubs or Organizations: No  . Attends  Club or Organization Meetings: Never  . Marital Status: Married  Human resources officer Violence: Not At Risk  . Fear of Current or Ex-Partner: No  . Emotionally Abused: No  . Physically Abused: No  . Sexually Abused: No    FAMILY HISTORY:  Family History  Problem Relation Age of Onset  . Heart disease Mother        Valve regurgitation and Pacemaker   . Diabetes Mother   . Hyperlipidemia Mother   . Heart disease Father        CABG x 5  . Hyperlipidemia Father   . Hypertension Father   . Heart attack Father   . Heart disease Sister        aortic valve replacement  . Cancer Sister 33       Colon cancer w/ metastasis  . Throat cancer Sister   . Hypertension Brother   . Heart disease Maternal Grandmother   . Heart disease Maternal Grandfather   . Heart disease Paternal Grandmother   . Cancer Paternal Grandfather   . Healthy Son   . Healthy Daughter     CURRENT MEDICATIONS:  Outpatient Encounter Medications as of 08/16/2019  Medication Sig  . aspirin EC 81 MG tablet Take 81 mg by mouth daily.  . calcium carbonate (OS-CAL) 600 MG tablet Take 1  tablet (600 mg total) by mouth 2 (two) times daily with a meal. (Patient taking differently: Take 600 mg by mouth 2 (two) times daily. )  . Cholecalciferol (VITAMIN D) 50 MCG (2000 UT) tablet Take 2,000 Units by mouth in the morning and at bedtime.  . clopidogrel (PLAVIX) 75 MG tablet TAKE 1 TABLET BY MOUTH ONCE A DAY.  . famotidine (PEPCID) 20 MG tablet TAKE 1 TABLET BY MOUTH AT BEDTIME.  . fluconazole (DIFLUCAN) 100 MG tablet Take 1 tablet (100 mg total) by mouth daily.  Marland Kitchen HYDROcodone-acetaminophen (NORCO) 10-325 MG tablet TAKE 1 TABLET BY MOUTH EVERY FOUR HOURS AS NEEDED.  . magnesium oxide (MAG-OX) 400 (241.3 Mg) MG tablet Take 1 tablet (400 mg total) by mouth 2 (two) times daily.  . mirtazapine (REMERON) 15 MG tablet Take 1 tablet (15 mg total) by mouth at bedtime.  . pantoprazole (PROTONIX) 40 MG tablet TAKE ONE TABLET BY MOUTH DAILY.  Marland Kitchen rOPINIRole (REQUIP) 3 MG tablet TAKE (1) TABLET BY MOUTH AT BEDTIME.  . simvastatin (ZOCOR) 40 MG tablet TAKE (1) TABLET BY MOUTH AT BEDTIME.  . [DISCONTINUED] CISPLATIN IV Inject into the vein once a week.  . [DISCONTINUED] lidocaine-prilocaine (EMLA) cream Apply a pea-sized amount to port a cath site and cover with plastic wrap 1 hour prior to appointment  . cyclobenzaprine (FLEXERIL) 5 MG tablet Take 5 mg by mouth at bedtime as needed for muscle spasms.  (Patient not taking: Reported on 08/16/2019)  . methocarbamol (ROBAXIN) 500 MG tablet Take 500 mg by mouth every 6 (six) hours as needed for muscle spasms. (Patient not taking: Reported on 08/16/2019)   No facility-administered encounter medications on file as of 08/16/2019.    ALLERGIES:  Allergies  Allergen Reactions  . Codeine Other (See Comments)    Bad headache and sweats  . Morphine And Related Itching    sweats  . Percocet [Oxycodone-Acetaminophen] Other (See Comments)    Headache and sweats     PHYSICAL EXAM:  ECOG Performance status: 1  Vitals:   08/16/19 0943  BP: 101/69  Pulse:  73  Resp: 20  Temp: 98.5 F (36.9 C)  SpO2: 98%  Filed Weights   08/16/19 0943  Weight: 141 lb 4.8 oz (64.1 kg)   Physical Exam Constitutional:      Appearance: Normal appearance. He is normal weight.  Cardiovascular:     Rate and Rhythm: Normal rate and regular rhythm.     Heart sounds: Normal heart sounds.  Pulmonary:     Effort: Pulmonary effort is normal.     Breath sounds: Normal breath sounds.  Abdominal:     General: Bowel sounds are normal.     Palpations: Abdomen is soft.  Musculoskeletal:        General: Normal range of motion.  Skin:    General: Skin is warm.  Neurological:     Mental Status: He is alert and oriented to person, place, and time. Mental status is at baseline.  Psychiatric:        Mood and Affect: Mood normal.        Behavior: Behavior normal.        Thought Content: Thought content normal.        Judgment: Judgment normal.      LABORATORY DATA:  I have reviewed the labs as listed.  CBC    Component Value Date/Time   WBC 3.4 (L) 08/14/2019 1520   RBC 2.37 (L) 08/14/2019 1520   HGB 8.3 (L) 08/14/2019 1520   HCT 26.3 (L) 08/14/2019 1520   PLT 201 08/14/2019 1520   MCV 111.0 (H) 08/14/2019 1520   MCH 35.0 (H) 08/14/2019 1520   MCHC 31.6 08/14/2019 1520   RDW 21.1 (H) 08/14/2019 1520   LYMPHSABS 0.8 08/14/2019 1520   MONOABS 0.4 08/14/2019 1520   EOSABS 0.0 08/14/2019 1520   BASOSABS 0.0 08/14/2019 1520   CMP Latest Ref Rng & Units 08/14/2019 07/19/2019 06/29/2019  Glucose 70 - 99 mg/dL 92 149(H) 88  BUN 8 - 23 mg/dL 15 13 21   Creatinine 0.61 - 1.24 mg/dL 1.11 1.17 1.50(H)  Sodium 135 - 145 mmol/L 137 136 138  Potassium 3.5 - 5.1 mmol/L 3.8 4.1 3.7  Chloride 98 - 111 mmol/L 104 101 101  CO2 22 - 32 mmol/L 23 27 25   Calcium 8.9 - 10.3 mg/dL 9.0 9.2 9.0  Total Protein 6.5 - 8.1 g/dL 6.3(L) 6.2(L) 6.4(L)  Total Bilirubin 0.3 - 1.2 mg/dL 0.5 0.6 0.6  Alkaline Phos 38 - 126 U/L 46 50 49  AST 15 - 41 U/L 14(L) 16 22  ALT 0 - 44 U/L 10  10 17     DIAGNOSTIC IMAGING:  I have independently reviewed the PET scans and discussed with the patient.  ASSESSMENT & PLAN:  Tonsillar cancer ([facility]) 1. 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 -Chemoradiation therapy with cisplatin weekly from 05/08/2019 through 06/26/2019. -Physical examination today did not reveal any palpable adenopathy. -PET scan done on 08/14/2019 showed no evidence of residual head neck carcinoma of the oropharynx or hypopharynx.  No metabolic lymph nodes in the neck.  Did show a focus of consolidation in the right lower lobe with mild metabolic activity favored infectious.  Repeat CT scan in 3 months. -We will give him a dose of Levaquin for 7 days and repeat CT scan. -Labs done on 08/14/2019 showed WBC 3.4, hemoglobin 8.3, platelets 201. -He is eating well and maintain his weight.  He reports he regained his taste and has  been eating great this week. -We will see him back in 4 weeks with repeat labs.  2.  Prostate cancer: -Gleason 9 prostate cancer, diagnosed in 2013, status post XRT from April 2014 through May 2014 with 2 years of Lupron. -PSA is normal.  3.  Nutrition/weight loss: -Patient reports he has regained his taste and has eating much better this week -However he has lost 7 pounds since his last visit. -She will start drinking Ensure daily.  4.  Hypomagnesemia: -Magnesium today is 1.7. -He is still taking magnesium twice daily.     Orders placed this encounter:  Orders Placed This Encounter  Procedures  . Lactate dehydrogenase  . CBC with Differential/Platelet  . Comprehensive metabolic panel  . Ferritin  . Iron and TIBC  . Vitamin B12  . VITAMIN D 25 Hydroxy (Vit-D Deficiency, Fractures)  . Folate  . TSH      Francene Finders, FNP-C [employer] [phone]

## 2019-08-16 NOTE — Assessment & Plan Note (Addendum)
1. 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 -Chemoradiation therapy with cisplatin weekly from 05/08/2019 through 06/26/2019. -Physical examination today did not reveal any palpable adenopathy. -PET scan done on 08/14/2019 showed no evidence of residual head neck carcinoma of the oropharynx or hypopharynx.  No metabolic lymph nodes in the neck.  Did show a focus of consolidation in the right lower lobe with mild metabolic activity favored infectious.  Repeat CT scan in 3 months. -We will give him a dose of Levaquin for 7 days and repeat CT scan. -Labs done on 08/14/2019 showed WBC 3.4, hemoglobin 8.3, platelets 201. -He is eating well and maintain his weight.  He reports he regained his taste and has been eating great this week. -We will see him back in 4 weeks with repeat labs.  2.  Prostate cancer: -Gleason 9 prostate cancer, diagnosed in 2013, status post XRT from April 2014 through May 2014 with 2 years of Lupron. -PSA is normal.  3.  Nutrition/weight loss: -Patient reports he has regained his taste and has eating much better this week -However he has lost 7 pounds since his last visit. -She will start drinking Ensure daily.  4.  Hypomagnesemia: -Magnesium today is 1.7. -He is still taking magnesium twice daily.

## 2019-08-18 ENCOUNTER — Other Ambulatory Visit: Payer: Self-pay

## 2019-08-18 ENCOUNTER — Encounter: Payer: Self-pay | Admitting: Family Medicine

## 2019-08-18 ENCOUNTER — Ambulatory Visit (INDEPENDENT_AMBULATORY_CARE_PROVIDER_SITE_OTHER): Payer: Medicare Other | Admitting: Family Medicine

## 2019-08-18 VITALS — BP 110/62 | HR 66 | Temp 97.9°F | Resp 14 | Ht 68.0 in | Wt 142.0 lb

## 2019-08-18 DIAGNOSIS — E440 Moderate protein-calorie malnutrition: Secondary | ICD-10-CM | POA: Diagnosis not present

## 2019-08-18 DIAGNOSIS — G2581 Restless legs syndrome: Secondary | ICD-10-CM | POA: Diagnosis not present

## 2019-08-18 DIAGNOSIS — G43709 Chronic migraine without aura, not intractable, without status migrainosus: Secondary | ICD-10-CM

## 2019-08-18 MED ORDER — HYDROCODONE-ACETAMINOPHEN 10-325 MG PO TABS: ORAL_TABLET | ORAL | 0 refills | Status: DC

## 2019-08-18 MED ORDER — PREDNISONE 10 MG PO TABS
ORAL_TABLET | ORAL | 0 refills | Status: DC
Start: 2019-08-18 — End: 2019-09-15

## 2019-08-18 NOTE — Progress Notes (Signed)
Subjective:    Patient ID: Cory Foley, male    DOB: December 21, 1950, 69 y.o.   MRN: [id number]  Patient presents for Frequent Headaches (x10 days- temporal HA on R side- no relief from IBU) and RLS (worsened Sx- only relieved when up and moving)  Patient here with complaint headaches. He has been treated for migraine headaches in the past with topamax. He   MRI performed does not show any evidence of cancer in setting of his known tonsillar carcinoma which he recently has completed chemotherapy and radiation for.   Topamax was stopped due to acute decreasing appetite. But he also noticed during this treatments for cancer he never had any HA. His last visit in May was HA free. In the past 10 days he has noticed a right temporal headache, has a burning sensaton then intense pain typically in the evening about 6pm. This HA feels different from his previous. He also gets a pressure behind the right eye but no change in vision and a burning sensation in his nostril. Denies any fever, sinus pressuer, drainage associated He is currently on antibiotics due to probable PNA picked up on his recent PET Scan by his oncologist.  He has been taking ibuprofen but minimal improvement in HA, also takes his norco..  Other concern is ongoing restless legs, he is on REquip 3mg  , feels like legs need to move all the time. He has been evaluated by vascular already for PVD.   he is also on magnesium 400mg  BID     His appetite in general has improved since he has completed his cancer treatments.      Review Of Systems:  GEN- denies fatigue, fever, weight loss,weakness, recent illness HEENT- denies eye drainage, change in vision, nasal discharge, CVS- denies chest pain, palpitations RESP- denies SOB, cough, wheeze ABD- denies N/V, change in stools, abd pain GU- denies dysuria, hematuria, dribbling, incontinence MSK- denies joint pain, muscle aches, injury Neuro- + headache, no dizziness, syncope, seizure  activity       Objective:    BP 110/62   Pulse 66   Temp 97.9 F (36.6 C) (Temporal)   Resp 14   Ht 5\' 8"  (1.727 m)   Wt 142 lb (64.4 kg)   SpO2 99%   BMI 21.59 kg/m  GEN- NAD, alert and oriented x3 HEENT- PERRL, EOMI, non injected sclera, pink conjunctiva, MMM, oropharynx clear Neck- Supple, no thyromegaly CVS- RRR, no murmur RESP-CTAB NEURO-CNII-XII grossly  In tact, TTP over right temporal region EXT- No edema Pulses- Radial2+        Assessment & Plan:      Problem List Items Addressed This Visit      Unprioritized   Migraines - Primary    Known migraine history , but with different HA pain today Other differentials an arteritis over the temporal region Sinus infection or migraine variant He is on antibiotics, will check CRP, go ahead and start prednisone taper to cover arteritis  Referral to Neurology Nothing on skin suggestive of shingles         Relevant Medications   HYDROcodone-acetaminophen (NORCO) 10-325 MG tablet   Other Relevant Orders   C-reactive protein (Completed)   Sedimentation Rate (Completed)   Ambulatory referral to Neurology   Protein-calorie malnutrition ([facility])    Appetite improving but weight has not improved very much  He has added protein shakes  No recurrence of cancer on recent PET       RLS (restless legs syndrome)  He is on on high dose requip Magnesium  Referral to neurology       Relevant Orders   Ambulatory referral to Neurology      Note: This dictation was prepared with Dragon dictation along with smaller phrase technology. Any transcriptional errors that result from this process are unintentional.

## 2019-08-18 NOTE — Patient Instructions (Signed)
F/U AS previous Referral to neurology Start steroids for the headache  Pain medicaton refilled

## 2019-08-19 LAB — SEDIMENTATION RATE: Sed Rate: 25 mm/h — ABNORMAL HIGH (ref 0–20)

## 2019-08-19 LAB — C-REACTIVE PROTEIN: CRP: 1.5 mg/L (ref <8.0)

## 2019-08-20 ENCOUNTER — Encounter: Payer: Self-pay | Admitting: Family Medicine

## 2019-08-20 NOTE — Assessment & Plan Note (Signed)
He is on on high dose requip Magnesium  Referral to neurology

## 2019-08-20 NOTE — Assessment & Plan Note (Signed)
Known migraine history , but with different HA pain today Other differentials an arteritis over the temporal region Sinus infection or migraine variant He is on antibiotics, will check CRP, go ahead and start prednisone taper to cover arteritis  Referral to Neurology Nothing on skin suggestive of shingles

## 2019-08-20 NOTE — Assessment & Plan Note (Signed)
Appetite improving but weight has not improved very much  He has added protein shakes  No recurrence of cancer on recent PET

## 2019-08-25 ENCOUNTER — Encounter (HOSPITAL_COMMUNITY): Payer: Medicare Other

## 2019-08-25 ENCOUNTER — Encounter (HOSPITAL_COMMUNITY): Payer: Self-pay

## 2019-08-25 NOTE — Progress Notes (Signed)
Nutrition  Called patient for nutrition follow-up.  No answer.  Left message for patient with call back number.  Sadee Osland B. Zenia Resides, [place], [place] Registered Dietitian [phone] (pager)

## 2019-08-30 ENCOUNTER — Telehealth: Payer: Self-pay | Admitting: *Deleted

## 2019-08-30 NOTE — Telephone Encounter (Signed)
-----   Message from Alycia Rossetti, MD sent at 08/29/2019  2:08 PM EDT ----- Regarding: FW: Recalst Infusion Reclast can be scheduled for Sept due to recent Radiation for his Cancer ----- Message ----- From: Derek Jack, MD Sent: 08/29/2019   2:04 PM EDT To: Alycia Rossetti, MD Subject: RE: Recalst Infusion                           Dr. Buelah Manis, As he has finished her radiation on 06/26/2019, you can delay his Prolia for the next 2 to 3 months.Thanks,Sreedhar ----- Message ----- From: Alycia Rossetti, MD Sent: 08/23/2019   3:44 PM EDT To: Derek Jack, MD Subject: Recalst Infusion                                 Hello,    [name] is due for reclast infusion for Osteoporosis. Do you think this is safe in setting of his recent throat cancer/chemo or should we post pone another 6 months        Dr.Centuria

## 2019-08-30 NOTE — Telephone Encounter (Signed)
Call placed to patient and patient made aware.   Infusion cancelled with [facility].

## 2019-09-06 ENCOUNTER — Encounter (HOSPITAL_COMMUNITY): Payer: Medicare Other

## 2019-09-13 ENCOUNTER — Inpatient Hospital Stay (HOSPITAL_COMMUNITY): Payer: Medicare Other | Attending: Hematology

## 2019-09-13 ENCOUNTER — Other Ambulatory Visit: Payer: Self-pay

## 2019-09-13 DIAGNOSIS — R5383 Other fatigue: Secondary | ICD-10-CM | POA: Insufficient documentation

## 2019-09-13 DIAGNOSIS — J449 Chronic obstructive pulmonary disease, unspecified: Secondary | ICD-10-CM | POA: Diagnosis not present

## 2019-09-13 DIAGNOSIS — F17210 Nicotine dependence, cigarettes, uncomplicated: Secondary | ICD-10-CM | POA: Insufficient documentation

## 2019-09-13 DIAGNOSIS — M1990 Unspecified osteoarthritis, unspecified site: Secondary | ICD-10-CM | POA: Diagnosis not present

## 2019-09-13 DIAGNOSIS — Z83438 Family history of other disorder of lipoprotein metabolism and other lipidemia: Secondary | ICD-10-CM | POA: Diagnosis not present

## 2019-09-13 DIAGNOSIS — E785 Hyperlipidemia, unspecified: Secondary | ICD-10-CM | POA: Insufficient documentation

## 2019-09-13 DIAGNOSIS — Z79899 Other long term (current) drug therapy: Secondary | ICD-10-CM | POA: Insufficient documentation

## 2019-09-13 DIAGNOSIS — Z800 Family history of malignant neoplasm of digestive organs: Secondary | ICD-10-CM | POA: Diagnosis not present

## 2019-09-13 DIAGNOSIS — Z809 Family history of malignant neoplasm, unspecified: Secondary | ICD-10-CM | POA: Insufficient documentation

## 2019-09-13 DIAGNOSIS — C099 Malignant neoplasm of tonsil, unspecified: Secondary | ICD-10-CM | POA: Insufficient documentation

## 2019-09-13 DIAGNOSIS — Z8546 Personal history of malignant neoplasm of prostate: Secondary | ICD-10-CM | POA: Insufficient documentation

## 2019-09-13 DIAGNOSIS — E538 Deficiency of other specified B group vitamins: Secondary | ICD-10-CM | POA: Insufficient documentation

## 2019-09-13 DIAGNOSIS — D509 Iron deficiency anemia, unspecified: Secondary | ICD-10-CM | POA: Insufficient documentation

## 2019-09-13 DIAGNOSIS — Z833 Family history of diabetes mellitus: Secondary | ICD-10-CM | POA: Insufficient documentation

## 2019-09-13 DIAGNOSIS — Z8249 Family history of ischemic heart disease and other diseases of the circulatory system: Secondary | ICD-10-CM | POA: Insufficient documentation

## 2019-09-13 DIAGNOSIS — G2581 Restless legs syndrome: Secondary | ICD-10-CM | POA: Diagnosis not present

## 2019-09-13 LAB — CBC WITH DIFFERENTIAL/PLATELET
Abs Immature Granulocytes: 0.01 10*3/uL (ref 0.00–0.07)
Basophils Absolute: 0 10*3/uL (ref 0.0–0.1)
Basophils Relative: 0 %
Eosinophils Absolute: 0.1 10*3/uL (ref 0.0–0.5)
Eosinophils Relative: 3 %
HCT: 30.3 % — ABNORMAL LOW (ref 39.0–52.0)
Hemoglobin: 9.7 g/dL — ABNORMAL LOW (ref 13.0–17.0)
Immature Granulocytes: 0 %
Lymphocytes Relative: 23 %
Lymphs Abs: 0.7 10*3/uL (ref 0.7–4.0)
MCH: 37.2 pg — ABNORMAL HIGH (ref 26.0–34.0)
MCHC: 32 g/dL (ref 30.0–36.0)
MCV: 116.1 fL — ABNORMAL HIGH (ref 80.0–100.0)
Monocytes Absolute: 0.3 10*3/uL (ref 0.1–1.0)
Monocytes Relative: 9 %
Neutro Abs: 1.9 10*3/uL (ref 1.7–7.7)
Neutrophils Relative %: 65 %
Platelets: 164 10*3/uL (ref 150–400)
RBC: 2.61 MIL/uL — ABNORMAL LOW (ref 4.22–5.81)
RDW: 14.7 % (ref 11.5–15.5)
WBC: 3 10*3/uL — ABNORMAL LOW (ref 4.0–10.5)
nRBC: 0 % (ref 0.0–0.2)

## 2019-09-13 LAB — COMPREHENSIVE METABOLIC PANEL
ALT: 10 U/L (ref 0–44)
AST: 13 U/L — ABNORMAL LOW (ref 15–41)
Albumin: 3.7 g/dL (ref 3.5–5.0)
Alkaline Phosphatase: 48 U/L (ref 38–126)
Anion gap: 9 (ref 5–15)
BUN: 15 mg/dL (ref 8–23)
CO2: 26 mmol/L (ref 22–32)
Calcium: 9.3 mg/dL (ref 8.9–10.3)
Chloride: 105 mmol/L (ref 98–111)
Creatinine, Ser: 0.96 mg/dL (ref 0.61–1.24)
GFR calc Af Amer: >60 mL/min (ref >60)
GFR calc non Af Amer: >60 mL/min (ref >60)
Glucose, Bld: 99 mg/dL (ref 70–99)
Potassium: 4.5 mmol/L (ref 3.5–5.1)
Sodium: 140 mmol/L (ref 135–145)
Total Bilirubin: 0.5 mg/dL (ref 0.3–1.2)
Total Protein: 6.6 g/dL (ref 6.5–8.1)

## 2019-09-13 LAB — VITAMIN D 25 HYDROXY (VIT D DEFICIENCY, FRACTURES): Vit D, 25-Hydroxy: 95.44 ng/mL (ref 30–100)

## 2019-09-13 LAB — FERRITIN: Ferritin: 150 ng/mL (ref 24–336)

## 2019-09-13 LAB — LACTATE DEHYDROGENASE: LDH: 111 U/L (ref 98–192)

## 2019-09-13 LAB — IRON AND TIBC
Iron: 70 ug/dL (ref 45–182)
Saturation Ratios: 23 % (ref 17.9–39.5)
TIBC: 309 ug/dL (ref 250–450)
UIBC: 239 ug/dL

## 2019-09-13 LAB — FOLATE: Folate: 7.2 ng/mL (ref >5.9)

## 2019-09-13 LAB — TSH: TSH: 0.527 u[IU]/mL (ref 0.350–4.500)

## 2019-09-13 LAB — VITAMIN B12: Vitamin B-12: 128 pg/mL — ABNORMAL LOW (ref 180–914)

## 2019-09-15 ENCOUNTER — Inpatient Hospital Stay (HOSPITAL_COMMUNITY): Payer: Medicare Other | Admitting: Nurse Practitioner

## 2019-09-15 ENCOUNTER — Ambulatory Visit (HOSPITAL_COMMUNITY): Payer: Medicare Other | Admitting: Nurse Practitioner

## 2019-09-15 ENCOUNTER — Other Ambulatory Visit: Payer: Self-pay

## 2019-09-15 VITALS — BP 88/57 | HR 60 | Temp 98.6°F | Resp 18 | Wt 139.4 lb

## 2019-09-15 DIAGNOSIS — D509 Iron deficiency anemia, unspecified: Secondary | ICD-10-CM | POA: Diagnosis not present

## 2019-09-15 DIAGNOSIS — G2581 Restless legs syndrome: Secondary | ICD-10-CM | POA: Diagnosis not present

## 2019-09-15 DIAGNOSIS — R5383 Other fatigue: Secondary | ICD-10-CM | POA: Diagnosis not present

## 2019-09-15 DIAGNOSIS — M1990 Unspecified osteoarthritis, unspecified site: Secondary | ICD-10-CM | POA: Diagnosis not present

## 2019-09-15 DIAGNOSIS — Z800 Family history of malignant neoplasm of digestive organs: Secondary | ICD-10-CM | POA: Diagnosis not present

## 2019-09-15 DIAGNOSIS — Z833 Family history of diabetes mellitus: Secondary | ICD-10-CM | POA: Diagnosis not present

## 2019-09-15 DIAGNOSIS — Z79899 Other long term (current) drug therapy: Secondary | ICD-10-CM | POA: Diagnosis not present

## 2019-09-15 DIAGNOSIS — E538 Deficiency of other specified B group vitamins: Secondary | ICD-10-CM

## 2019-09-15 DIAGNOSIS — Z8249 Family history of ischemic heart disease and other diseases of the circulatory system: Secondary | ICD-10-CM | POA: Diagnosis not present

## 2019-09-15 DIAGNOSIS — Z83438 Family history of other disorder of lipoprotein metabolism and other lipidemia: Secondary | ICD-10-CM | POA: Diagnosis not present

## 2019-09-15 DIAGNOSIS — C099 Malignant neoplasm of tonsil, unspecified: Secondary | ICD-10-CM

## 2019-09-15 DIAGNOSIS — J449 Chronic obstructive pulmonary disease, unspecified: Secondary | ICD-10-CM | POA: Diagnosis not present

## 2019-09-15 DIAGNOSIS — E785 Hyperlipidemia, unspecified: Secondary | ICD-10-CM | POA: Diagnosis not present

## 2019-09-15 DIAGNOSIS — Z809 Family history of malignant neoplasm, unspecified: Secondary | ICD-10-CM | POA: Diagnosis not present

## 2019-09-15 MED ORDER — CYANOCOBALAMIN 1000 MCG/ML IJ SOLN
1000.0000 ug | Freq: Once | INTRAMUSCULAR | Status: AC
  Administered 2019-09-15: 1000 ug via INTRAMUSCULAR

## 2019-09-15 NOTE — Assessment & Plan Note (Addendum)
1. 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 -Chemoradiation therapy with cisplatin weekly from 05/08/2019 through 06/26/2019. -Physical examination today did not reveal any palpable adenopathy. -PET scan done on 08/14/2019 showed no evidence of residual head neck carcinoma of the oropharynx or hypopharynx.  No metabolic lymph nodes in the neck.  Did show a focus of consolidation in the right lower lobe with mild metabolic activity favored infectious.  Repeat CT scan in 3 months. -We will give him a dose of Levaquin for 7 days and repeat CT scan. He has completed his dose of Levaquin and feels great. -Labs done on 09/13/2019 showed WBC 3.0, hemoglobin 9.7, platelets 164. -He is eating well and maintain his weight.  He reports he regained his taste and has been eating great this week. -We will see him back in 2 months with repeat labs and CT scan.  2.  Prostate cancer: -Gleason 9 prostate cancer, diagnosed in 2013, status post XRT from April 2014 through May 2014 with 2 years of Lupron. -PSA is normal.  3.  Nutrition/weight loss: -Patient reports he has regained his taste and has eating much better this week -However he has lost 7 pounds since his last visit. -She will start drinking Ensure daily.  4.  Hypomagnesemia: -Magnesium today is 1.7. -He is still taking magnesium twice daily.  5. B12 deficiency: -Labs done on 09/13/2019 showed his B12 128 -We will give him injection today. -We will start him on 1 mg daily  6. Iron deficiency anemia: -Labs done on 09/13/2019 showed hemoglobin 9.7, ferritin 150, percent saturation 23 -Patient wanted to see if B12 will help him at all first and we will be visit iron pills at his next visit.

## 2019-09-15 NOTE — Progress Notes (Signed)
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[place])   05/08/2019 -  Chemotherapy   The patient had palonosetron (ALOXI) injection 0.25 mg, 0.25 mg, Intravenous,  Once, 6 of 7 cycles Administration: 0.25 mg (05/08/2019), 0.25 mg (05/15/2019), 0.25 mg (05/22/2019), 0.25 mg (05/29/2019), 0.25 mg (06/15/2019), 0.25 mg (06/22/2019) CISplatin (PLATINOL) 77 mg in sodium chloride 0.9 % 250 mL chemo infusion, 40 mg/m2 = 77 mg, Intravenous,  Once, 6 of 7 cycles Dose modification: 32 mg/m2 (80 % of original dose 40 mg/m2, Cycle 5, Reason: Other (see comments), Comment: cytopenia) Administration: 77 mg (05/08/2019), 77 mg (05/15/2019), 77 mg (05/22/2019), 77 mg (05/29/2019), 62 mg (06/15/2019), 77 mg (06/22/2019) fosaprepitant (EMEND) 150 mg in sodium chloride 0.9 % 145 mL IVPB, 150 mg, Intravenous,  Once, 6 of 7 cycles Administration: 150 mg (05/08/2019), 150 mg (05/15/2019), 150 mg (05/22/2019), 150 mg (05/29/2019), 150 mg (06/15/2019), 150 mg (06/22/2019)  for chemotherapy treatment.      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 Handley 69 y.o. male returns for routine follow-up for tonsillar cancer. Patient reports he is feeling well since his last visit. He denies any new cough or trouble swallowing. He does report increased fatigue however he is taking care of his father that is on hospice. Denies any nausea, vomiting, or diarrhea. Denies any new pains. Had not noticed any recent bleeding  such as epistaxis, hematuria or hematochezia. Denies recent chest pain on exertion, shortness of breath on minimal exertion, pre-syncopal episodes, or palpitations. Denies any numbness or tingling in hands or feet. Denies any recent fevers, infections, or recent hospitalizations. Patient reports appetite at 75% and energy level at 75%.    REVIEW OF SYSTEMS:  Review of Systems  Constitutional: Positive for fatigue.  All other systems reviewed and are negative.    PAST MEDICAL/SURGICAL HISTORY:  Past Medical History:  Diagnosis Date  . Allergy   . Anxiety   . Arthritis   . BPH with obstruction/lower urinary tract symptoms   . COPD, mild ([place])    current smoker  . ED (erectile dysfunction)   . GERD (gastroesophageal reflux disease)   . Hyperlipidemia   . Nocturia   . Osteoporosis   . PAD (peripheral artery disease) ([place])   . Port-A-Cath in place 04/28/2019  . Prostate cancer ([place]) 01/11/2012   Adenocarcinoma, Gleason=4+4=8, & 4+5=9,PSA=17.24, Volume= 15.45cc  . PVD (peripheral vascular disease) ([place])   . Restless leg syndrome   . Tongue mass    SCC   Past Surgical History:  Procedure Laterality Date  . ABDOMINAL AORTOGRAM W/LOWER EXTREMITY N/A 08/16/2018   Procedure: ABDOMINAL AORTOGRAM W/LOWER EXTREMITY;  Surgeon: Serafina Mitchell, MD;  Location: [facility];  Service: Cardiovascular;  Laterality: N/A;  Bilateral  . COLONOSCOPY N/A 09/28/2012   Procedure: COLONOSCOPY;  Surgeon: Danie Binder, MD;  [facility];  Service: Endoscopy;  Laterality: N/A;  8:30  . DIRECT LARYNGOSCOPY N/A 04/03/2019   Procedure: DIRECT LARYNGOSCOPY;  Surgeon:  Leta Baptist, MD;  Location: [facility];  Service: ENT;  Laterality: N/A;  . ESOPHAGOGASTRODUODENOSCOPY N/A 09/28/2012   Procedure: ESOPHAGOGASTRODUODENOSCOPY (EGD);  Surgeon: Danie Binder, MD;  [facility];  Service: Endoscopy;  Laterality: N/A;  . Gold seed implatation  04/28/2012  . HERNIA REPAIR      Bilateral inguinal X2  . IR IMAGING GUIDED PORT INSERTION  05/03/2019  . JOINT REPLACEMENT     bilateral  . KNEE SURGERY     Left Knee X 2   and Right knee X1  . MALONEY DILATION N/A 09/28/2012   Procedure: MALONEY DILATION;  Surgeon: Danie Binder, MD;  [facility];  Service: Endoscopy;  Laterality: N/A;  . NOSE SURGERY    . PERIPHERAL VASCULAR INTERVENTION  08/16/2018   Procedure: PERIPHERAL VASCULAR INTERVENTION;  Surgeon: Serafina Mitchell, MD;  Location: [facility];  Service: Cardiovascular;;  LT Iliac  . PR VEIN BYPASS GRAFT,AORTO-FEM-POP  10/21/10   Left AK to BK popliteal BPG  . PROSTATE BIOPSY  01/11/2012   Adenocarcinoma  . PROSTATE SURGERY  2015   Chemo and  Radiation  . SAVORY DILATION N/A 09/28/2012   Procedure: SAVORY DILATION;  Surgeon: Danie Binder, MD;  [facility];  Service: Endoscopy;  Laterality: N/A;  . throat biopsy  03/20/2019  . TONSILLECTOMY Left 04/03/2019   Procedure: TONSILLECTOMY;  Surgeon: Leta Baptist, MD;  Location: [facility];  Service: ENT;  Laterality: Left;     SOCIAL HISTORY:  Social History   Socioeconomic History  . Marital status: Married    Spouse name: Holley Raring  . Number of children: 2  . Years of education: College  . 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 Smoking status: Current Every Day Smoker    Packs/day: 0.50    Years: 43.00    Pack years: 21.50    Types: Cigarettes  . Smokeless tobacco: Never Used  . Tobacco comment: 1/2 ppd  Substance and Sexual Activity  . Alcohol use: No    Comment: quit 1.5 years ago  . Drug use: No  . Sexual activity: Not Currently  Other Topics Concern  . Not on file  Social History Narrative   Patient lives at home with spouse.   Caffeine use: very little   Social Determinants of Health   Financial Resource Strain: Low  Risk   . Difficulty of Paying Living Expenses: Not hard at all  Food Insecurity: No Food Insecurity  . Worried About Charity fundraiser in the Last Year: Never true  . Ran Out of Food in the Last Year: Never true  Transportation Needs: No Transportation Needs  . Lack of Transportation (Medical): No  . Lack of Transportation (Non-Medical): No  Physical Activity: Sufficiently Active  . Days of Exercise per Week: 5 days  . Minutes of Exercise per Session: 30 min  Stress: Stress Concern Present  . Feeling of Stress : Rather much  Social Connections: Moderately Integrated  . Frequency of Communication with Friends and Family: Twice a week  . Frequency of Social Gatherings with Friends and Family: Twice a week  . Attends Religious Services: More than 4 times per year  . Active Member of Clubs or Organizations: No  . Attends Archivist Meetings: Never  .  Marital Status: Married  Human resources officer Violence: Not At Risk  . Fear of Current or Ex-Partner: No  . Emotionally Abused: No  . Physically Abused: No  . Sexually Abused: No    FAMILY HISTORY:  Family History  Problem Relation Age of Onset  . Heart disease Mother        Valve regurgitation and Pacemaker   . Diabetes Mother   . Hyperlipidemia Mother   . Heart disease Father        CABG x 5  . Hyperlipidemia Father   . Hypertension Father   . Heart attack Father   . Heart disease Sister        aortic valve replacement  . Cancer Sister 11       Colon cancer w/ metastasis  . Throat cancer Sister   . Hypertension Brother   . Heart disease Maternal Grandmother   . Heart disease Maternal Grandfather   . Heart disease Paternal Grandmother   . Cancer Paternal Grandfather   . Healthy Son   . Healthy Daughter     CURRENT MEDICATIONS:  Outpatient Encounter Medications as of 09/15/2019  Medication Sig  . aspirin EC 81 MG tablet Take 81 mg by mouth daily.  . calcium carbonate (OS-CAL) 600 MG tablet Take 1 tablet (600 mg  total) by mouth 2 (two) times daily with a meal. (Patient taking differently: Take 600 mg by mouth 2 (two) times daily. )  . Cholecalciferol (VITAMIN D) 50 MCG (2000 UT) tablet Take 2,000 Units by mouth in the morning and at bedtime.  . clopidogrel (PLAVIX) 75 MG tablet TAKE 1 TABLET BY MOUTH ONCE A DAY.  . famotidine (PEPCID) 20 MG tablet TAKE 1 TABLET BY MOUTH AT BEDTIME.  . magnesium oxide (MAG-OX) 400 (241.3 Mg) MG tablet Take 1 tablet (400 mg total) by mouth 2 (two) times daily.  . mirtazapine (REMERON) 15 MG tablet Take 1 tablet (15 mg total) by mouth at bedtime.  . pantoprazole (PROTONIX) 40 MG tablet TAKE ONE TABLET BY MOUTH DAILY.  Marland Kitchen rOPINIRole (REQUIP) 3 MG tablet TAKE (1) TABLET BY MOUTH AT BEDTIME.  . simvastatin (ZOCOR) 40 MG tablet TAKE (1) TABLET BY MOUTH AT BEDTIME.  Marland Kitchen HYDROcodone-acetaminophen (NORCO) 10-325 MG tablet TAKE 1 TABLET BY MOUTH EVERY FOUR HOURS AS NEEDED. (Patient not taking: Reported on 09/15/2019)  . methocarbamol (ROBAXIN) 500 MG tablet Take 500 mg by mouth every 6 (six) hours as needed for muscle spasms.  (Patient not taking: Reported on 09/15/2019)  . [DISCONTINUED] cyclobenzaprine (FLEXERIL) 5 MG tablet Take 5 mg by mouth at bedtime as needed for muscle spasms.   . [DISCONTINUED] levofloxacin (LEVAQUIN) 250 MG tablet Take 2 tablets (500 mg total) by mouth daily.  . [DISCONTINUED] predniSONE (DELTASONE) 10 MG tablet Take 40mg  x 3 days,20mg  x 3 days, 10mg  x 3 days  . [EXPIRED] cyanocobalamin ((VITAMIN B-12)) injection 1,000 mcg    No facility-administered encounter medications on file as of 09/15/2019.    ALLERGIES:  Allergies  Allergen Reactions  . Codeine Other (See Comments)    Bad headache and sweats  . Morphine And Related Itching    sweats  . Percocet [Oxycodone-Acetaminophen] Other (See Comments)    Headache and sweats     PHYSICAL EXAM:  ECOG Performance status: 1  Vitals:   09/15/19 1154 09/15/19 1155  BP: (!) 85/54 (!) 88/57  Pulse: 60     Resp: 18   Temp: 98.6 F (37 C)   SpO2: 100%  Filed Weights   09/15/19 1154  Weight: 139 lb 6.4 oz (63.2 kg)   Physical Exam Constitutional:      Appearance: Normal appearance. He is normal weight.  Cardiovascular:     Rate and Rhythm: Normal rate and regular rhythm.     Heart sounds: Normal heart sounds.  Pulmonary:     Effort: Pulmonary effort is normal.     Breath sounds: Normal breath sounds.  Abdominal:     General: Bowel sounds are normal.     Palpations: Abdomen is soft.  Musculoskeletal:        General: Normal range of motion.  Skin:    General: Skin is warm.  Neurological:     Mental Status: He is alert and oriented to person, place, and time. Mental status is at baseline.  Psychiatric:        Mood and Affect: Mood normal.        Behavior: Behavior normal.        Thought Content: Thought content normal.        Judgment: Judgment normal.      LABORATORY DATA:  I have reviewed the labs as listed.  CBC    Component Value Date/Time   WBC 3.0 (L) 09/13/2019 0927   RBC 2.61 (L) 09/13/2019 0927   HGB 9.7 (L) 09/13/2019 0927   HCT 30.3 (L) 09/13/2019 0927   PLT 164 09/13/2019 0927   MCV 116.1 (H) 09/13/2019 0927   MCH 37.2 (H) 09/13/2019 0927   MCHC 32.0 09/13/2019 0927   RDW 14.7 09/13/2019 0927   LYMPHSABS 0.7 09/13/2019 0927   MONOABS 0.3 09/13/2019 0927   EOSABS 0.1 09/13/2019 0927   BASOSABS 0.0 09/13/2019 0927   CMP Latest Ref Rng & Units 09/13/2019 08/14/2019 07/19/2019  Glucose 70 - 99 mg/dL 99 92 149(H)  BUN 8 - 23 mg/dL 15 15 13   Creatinine 0.61 - 1.24 mg/dL 0.96 1.11 1.17  Sodium 135 - 145 mmol/L 140 137 136  Potassium 3.5 - 5.1 mmol/L 4.5 3.8 4.1  Chloride 98 - 111 mmol/L 105 104 101  CO2 22 - 32 mmol/L 26 23 27   Calcium 8.9 - 10.3 mg/dL 9.3 9.0 9.2  Total Protein 6.5 - 8.1 g/dL 6.6 6.3(L) 6.2(L)  Total Bilirubin 0.3 - 1.2 mg/dL 0.5 0.5 0.6  Alkaline Phos 38 - 126 U/L 48 46 50  AST 15 - 41 U/L 13(L) 14(L) 16  ALT 0 - 44 U/L 10 10 10      All questions were answered to patient's stated satisfaction. Encouraged patient to call with any new concerns or questions before his next visit to the cancer center and we can certain see him sooner, if needed.     ASSESSMENT & PLAN:  Tonsillar cancer ([place]) 1. 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 -Chemoradiation therapy with cisplatin weekly from 05/08/2019 through 06/26/2019. -Physical examination today did not reveal any palpable adenopathy. -PET scan done on 08/14/2019 showed no evidence of residual head neck carcinoma of the oropharynx or hypopharynx.  No metabolic lymph nodes in the neck.  Did show a focus of consolidation in the right lower lobe with mild metabolic activity favored infectious.  Repeat CT scan in 3 months. -We will give him a dose of Levaquin for 7 days and repeat CT scan. He has completed his dose of  Levaquin and feels great. -Labs done on 09/13/2019 showed WBC 3.0, hemoglobin 9.7, platelets 164. -He is eating well and maintain his weight.  He reports he regained his taste and has been eating great this week. -We will see him back in 2 months with repeat labs and CT scan.  2.  Prostate cancer: -Gleason 9 prostate cancer, diagnosed in 2013, status post XRT from April 2014 through May 2014 with 2 years of Lupron. -PSA is normal.  3.  Nutrition/weight loss: -Patient reports he has regained his taste and has eating much better this week -However he has lost 7 pounds since his last visit. -She will start drinking Ensure daily.  4.  Hypomagnesemia: -Magnesium today is 1.7. -He is still taking magnesium twice daily.  5. B12 deficiency: -Labs done on 09/13/2019 showed his B12 128 -We will give him injection today. -We will start him on 1 mg daily  6. Iron deficiency anemia: -Labs done on 09/13/2019 showed  hemoglobin 9.7, ferritin 150, percent saturation 23 -Patient wanted to see if B12 will help him at all first and we will be visit iron pills at his next visit.     Orders placed this encounter:  Orders Placed This Encounter  Procedures  . Lactate dehydrogenase  . CBC with Differential/Platelet  . Comprehensive metabolic panel  . Ferritin  . Iron and TIBC  . Vitamin B12  . VITAMIN D 25 Hydroxy (Vit-D Deficiency, Fractures)  . TSH     Francene Finders, FNP-C [employer] [phone]

## 2019-09-18 ENCOUNTER — Other Ambulatory Visit: Payer: Self-pay | Admitting: Family Medicine

## 2019-09-27 DIAGNOSIS — C090 Malignant neoplasm of tonsillar fossa: Secondary | ICD-10-CM | POA: Diagnosis not present

## 2019-09-27 DIAGNOSIS — C099 Malignant neoplasm of tonsil, unspecified: Secondary | ICD-10-CM | POA: Diagnosis not present

## 2019-09-27 DIAGNOSIS — Z510 Encounter for antineoplastic radiation therapy: Secondary | ICD-10-CM | POA: Diagnosis not present

## 2019-09-28 ENCOUNTER — Other Ambulatory Visit: Payer: Self-pay | Admitting: Family Medicine

## 2019-09-28 NOTE — Telephone Encounter (Signed)
Ok to refill??  Last office visit/ refill 08/18/2019.

## 2019-10-09 ENCOUNTER — Other Ambulatory Visit: Payer: Self-pay | Admitting: Family Medicine

## 2019-10-26 ENCOUNTER — Other Ambulatory Visit: Payer: Self-pay | Admitting: Vascular Surgery

## 2019-10-27 ENCOUNTER — Ambulatory Visit (INDEPENDENT_AMBULATORY_CARE_PROVIDER_SITE_OTHER): Payer: Medicare Other | Admitting: Family Medicine

## 2019-10-27 ENCOUNTER — Other Ambulatory Visit: Payer: Self-pay

## 2019-10-27 ENCOUNTER — Encounter: Payer: Self-pay | Admitting: Family Medicine

## 2019-10-27 VITALS — BP 108/62 | HR 56 | Temp 97.9°F | Resp 14 | Ht 68.0 in | Wt 134.0 lb

## 2019-10-27 DIAGNOSIS — E440 Moderate protein-calorie malnutrition: Secondary | ICD-10-CM

## 2019-10-27 DIAGNOSIS — Z0001 Encounter for general adult medical examination with abnormal findings: Secondary | ICD-10-CM | POA: Diagnosis not present

## 2019-10-27 DIAGNOSIS — Z23 Encounter for immunization: Secondary | ICD-10-CM | POA: Diagnosis not present

## 2019-10-27 DIAGNOSIS — Z0000 Encounter for general adult medical examination without abnormal findings: Secondary | ICD-10-CM

## 2019-10-27 DIAGNOSIS — E7800 Pure hypercholesterolemia, unspecified: Secondary | ICD-10-CM | POA: Diagnosis not present

## 2019-10-27 DIAGNOSIS — M810 Age-related osteoporosis without current pathological fracture: Secondary | ICD-10-CM

## 2019-10-27 DIAGNOSIS — K219 Gastro-esophageal reflux disease without esophagitis: Secondary | ICD-10-CM | POA: Diagnosis not present

## 2019-10-27 DIAGNOSIS — J42 Unspecified chronic bronchitis: Secondary | ICD-10-CM

## 2019-10-27 MED ORDER — MIRTAZAPINE 30 MG PO TABS: 30.0000 mg | ORAL_TABLET | Freq: Every day | ORAL | 3 refills | Status: DC

## 2019-10-27 NOTE — Progress Notes (Signed)
Subjective:   Patient presents for Medicare Annual/Subsequent preventive examination.     He continues to lose weight He has CT scan on 21st on his on his chest   He is swallowing good, history  Head and neck cancer. Last PET scan showed the lung nodule, therefore CTscan being done  He doesn't feel very hungry and feels full after a few bites of food, but not pain with eating, he still drinks, ensure, taking remeron 15mg  at bedtime   Only needs pain for when things flare of up   He is on B12 tablets for deficiency and magnesium   Osteoprosois due for treatment  - RECLAST      Review Past Medical/Family/Social: per EMR   Risk Factors  Current exercise habits: none,does active things around the house  Dietary issues discussed: Yes  Cardiac risk factors:  PVD, hyperlipidemia, COPD  Depression Screen  (Note: if answer to either of the following is "Yes", a more complete depression screening is indicated)  Over the past two weeks, have you felt down, depressed or hopeless? No Over the past two weeks, have you felt little interest or pleasure in doing things? No Have you lost interest or pleasure in daily life? No Do you often feel hopeless? No Do you cry easily over simple problems? No   Activities of Daily Living  In your present state of health, do you have any difficulty performing the following activities?:  Driving? No  Managing money? No  Feeding yourself? No  Getting from bed to chair? No  Climbing a flight of stairs? No  Preparing food and eating?: No  Bathing or showering? No  Getting dressed: No  Getting to the toilet? No  Using the toilet:No  Moving around from place to place: No  In the past year have you fallen or had a near fall?:No  Are you sexually active? No  Do you have more than one partner? No   Hearing Difficulties: No  Do you often ask people to speak up or repeat themselves? No  Do you experience ringing or noises in your ears? No Do you have  difficulty understanding soft or whispered voices? No  Do you feel that you have a problem with memory? No Do you often misplace items? No  Do you feel safe at home? Yes  Cognitive Testing  Alert? Yes Normal Appearance?Yes  Oriented to person? Yes Place? Yes  Time? Yes  Recall of three objects? Yes  Can perform simple calculations? Yes  Displays appropriate judgment?Yes  Can read the correct time from a watch face?Yes   List the Names of Other Physician/Practitioners you currently use:  Oncology, urology  Screening Tests / Date Colonoscopy UTD                    PNA- UTD Influenza Vaccine Due  Tetanus/tdap- declines  PNA- UTD  COVID-19 Declines  ROS: GEN- + fatigue, fever, +weight loss,weakness, recent illness HEENT- denies eye drainage, change in vision, nasal discharge, CVS- denies chest pain, palpitations RESP- denies SOB, cough, wheeze ABD- denies N/V, change in stools, abd pain GU- denies dysuria, hematuria, dribbling, incontinence MSK- denies joint pain, muscle aches, injury Neuro- denies headache, dizziness, syncope, seizure activity  PHYSICAL: vitals reviewed, weight down another 8lbs since June visit  GEN- NAD, alert and oriented x3 HEENT- PERRL, EOMI, non injected sclera, pink conjunctiva, MMM, oropharynx clear Neck- Supple, no thryomegaly CVS- RRR, no murmur RESP-CTAB ABD-NABS,soft,NT,ND EXT- No edema Pulses- Radial, DP- 2+  FALL/DEPRESSION/AUDIT C neg   Assessment:    Annual wellness medicare exam   Plan:    During the course of the visit the patient was educated and counseled about appropriate screening and preventive services including:    Immunizations- flu shot given, declines covid -19vaccine understands risk  Osteoporosis- due for relcast infusion, started treatment in 2017  COPDcurrently stable  Protein malnutrition with ongoing weight loss- he recently had head and neck cancer treatment contributing to difficullty eating and weight  loss, now with abnormality in lungs, being worked up  increase remeron to 30mg  once a dayfor weight   continue daily boost/ensure               Diet review for nutrition referral? Yes ____ Not Indicated __x__  Patient Instructions (the written plan) was given to the patient.  Medicare Attestation  I have personally reviewed:  The patient's medical and social history  Their use of alcohol, tobacco or illicit drugs  Their current medications and supplements  The patient's functional ability including ADLs,fall risks, home safety risks, cognitive, and hearing and visual impairment  Diet and physical activities  Evidence for depression or mood disorders  The patient's weight, height, BMI, and visual acuity have been recorded in the chart. I have made referrals, counseling, and provided education to the patient based on review of the above and I have provided the patient with a written personalized care plan for preventive services.

## 2019-10-27 NOTE — Patient Instructions (Signed)
Infusion to be set up for your bones F/U 4 months

## 2019-10-28 LAB — CBC WITH DIFFERENTIAL/PLATELET
Absolute Monocytes: 287 cells/uL (ref 200–950)
Basophils Absolute: 21 cells/uL (ref 0–200)
Basophils Relative: 0.6 %
Eosinophils Absolute: 81 cells/uL (ref 15–500)
Eosinophils Relative: 2.3 %
HCT: 32.3 % — ABNORMAL LOW (ref 38.5–50.0)
Hemoglobin: 10.6 g/dL — ABNORMAL LOW (ref 13.2–17.1)
Lymphs Abs: 840 cells/uL — ABNORMAL LOW (ref 850–3900)
MCH: 35.9 pg — ABNORMAL HIGH (ref 27.0–33.0)
MCHC: 32.8 g/dL (ref 32.0–36.0)
MCV: 109.5 fL — ABNORMAL HIGH (ref 80.0–100.0)
MPV: 11.1 fL (ref 7.5–12.5)
Monocytes Relative: 8.2 %
Neutro Abs: 2272 cells/uL (ref 1500–7800)
Neutrophils Relative %: 64.9 %
Platelets: 187 10*3/uL (ref 140–400)
RBC: 2.95 10*6/uL — ABNORMAL LOW (ref 4.20–5.80)
RDW: 11.7 % (ref 11.0–15.0)
Total Lymphocyte: 24 %
WBC: 3.5 10*3/uL — ABNORMAL LOW (ref 3.8–10.8)

## 2019-10-28 LAB — COMPREHENSIVE METABOLIC PANEL
AG Ratio: 2 (calc) (ref 1.0–2.5)
ALT: 9 U/L (ref 9–46)
AST: 15 U/L (ref 10–35)
Albumin: 4.1 g/dL (ref 3.6–5.1)
Alkaline phosphatase (APISO): 48 U/L (ref 35–144)
BUN: 13 mg/dL (ref 7–25)
CO2: 27 mmol/L (ref 20–32)
Calcium: 9.4 mg/dL (ref 8.6–10.3)
Chloride: 105 mmol/L (ref 98–110)
Creat: 0.96 mg/dL (ref 0.70–1.25)
Globulin: 2.1 g/dL (calc) (ref 1.9–3.7)
Glucose, Bld: 85 mg/dL (ref 65–99)
Potassium: 4.8 mmol/L (ref 3.5–5.3)
Sodium: 141 mmol/L (ref 135–146)
Total Bilirubin: 0.3 mg/dL (ref 0.2–1.2)
Total Protein: 6.2 g/dL (ref 6.1–8.1)

## 2019-10-28 LAB — LIPID PANEL
Cholesterol: 140 mg/dL (ref <200)
HDL: 47 mg/dL (ref >=40)
LDL Cholesterol (Calc): 73 mg/dL (calc)
Non-HDL Cholesterol (Calc): 93 mg/dL (calc) (ref <130)
Total CHOL/HDL Ratio: 3 (calc) (ref <5.0)
Triglycerides: 113 mg/dL (ref <150)

## 2019-10-29 ENCOUNTER — Encounter: Payer: Self-pay | Admitting: Family Medicine

## 2019-11-06 ENCOUNTER — Other Ambulatory Visit: Payer: Self-pay

## 2019-11-06 ENCOUNTER — Encounter (HOSPITAL_COMMUNITY): Payer: Self-pay

## 2019-11-06 ENCOUNTER — Encounter (HOSPITAL_COMMUNITY)
Admission: RE | Admit: 2019-11-06 | Discharge: 2019-11-06 | Disposition: A | Discharge status: Home / Self Care | Payer: Medicare Other | Attending: Family Medicine | Admitting: Family Medicine

## 2019-11-06 DIAGNOSIS — M810 Age-related osteoporosis without current pathological fracture: Secondary | ICD-10-CM | POA: Insufficient documentation

## 2019-11-06 DIAGNOSIS — K219 Gastro-esophageal reflux disease without esophagitis: Secondary | ICD-10-CM | POA: Diagnosis not present

## 2019-11-06 MED ORDER — SODIUM CHLORIDE 0.9 % IV SOLN: Freq: Once | INTRAVENOUS | Status: AC

## 2019-11-06 MED ORDER — ZOLEDRONIC ACID 5 MG/100ML IV SOLN
5.0000 mg | Freq: Once | INTRAVENOUS | Status: AC
  Administered 2019-11-06: 5 mg via INTRAVENOUS

## 2019-11-14 ENCOUNTER — Ambulatory Visit (HOSPITAL_COMMUNITY)
Admission: RE | Admit: 2019-11-14 | Discharge: 2019-11-14 | Disposition: A | Discharge status: Home / Self Care | Payer: Medicare Other | Source: Ambulatory Visit | Attending: Nurse Practitioner | Admitting: Nurse Practitioner

## 2019-11-14 ENCOUNTER — Inpatient Hospital Stay (HOSPITAL_COMMUNITY): Payer: Medicare Other | Attending: Hematology

## 2019-11-14 ENCOUNTER — Other Ambulatory Visit: Payer: Self-pay

## 2019-11-14 DIAGNOSIS — D509 Iron deficiency anemia, unspecified: Secondary | ICD-10-CM | POA: Insufficient documentation

## 2019-11-14 DIAGNOSIS — Z833 Family history of diabetes mellitus: Secondary | ICD-10-CM | POA: Diagnosis not present

## 2019-11-14 DIAGNOSIS — C099 Malignant neoplasm of tonsil, unspecified: Secondary | ICD-10-CM | POA: Insufficient documentation

## 2019-11-14 DIAGNOSIS — R634 Abnormal weight loss: Secondary | ICD-10-CM | POA: Diagnosis not present

## 2019-11-14 DIAGNOSIS — Z8546 Personal history of malignant neoplasm of prostate: Secondary | ICD-10-CM | POA: Diagnosis not present

## 2019-11-14 DIAGNOSIS — F17210 Nicotine dependence, cigarettes, uncomplicated: Secondary | ICD-10-CM | POA: Diagnosis not present

## 2019-11-14 DIAGNOSIS — N289 Disorder of kidney and ureter, unspecified: Secondary | ICD-10-CM | POA: Diagnosis not present

## 2019-11-14 DIAGNOSIS — Z800 Family history of malignant neoplasm of digestive organs: Secondary | ICD-10-CM | POA: Diagnosis not present

## 2019-11-14 DIAGNOSIS — Z8249 Family history of ischemic heart disease and other diseases of the circulatory system: Secondary | ICD-10-CM | POA: Insufficient documentation

## 2019-11-14 DIAGNOSIS — Z79899 Other long term (current) drug therapy: Secondary | ICD-10-CM | POA: Insufficient documentation

## 2019-11-14 DIAGNOSIS — J189 Pneumonia, unspecified organism: Secondary | ICD-10-CM | POA: Insufficient documentation

## 2019-11-14 DIAGNOSIS — Z809 Family history of malignant neoplasm, unspecified: Secondary | ICD-10-CM | POA: Insufficient documentation

## 2019-11-14 DIAGNOSIS — R5383 Other fatigue: Secondary | ICD-10-CM | POA: Diagnosis not present

## 2019-11-14 DIAGNOSIS — J432 Centrilobular emphysema: Secondary | ICD-10-CM | POA: Diagnosis not present

## 2019-11-14 DIAGNOSIS — Z83438 Family history of other disorder of lipoprotein metabolism and other lipidemia: Secondary | ICD-10-CM | POA: Insufficient documentation

## 2019-11-14 DIAGNOSIS — I700 Atherosclerosis of aorta: Secondary | ICD-10-CM | POA: Diagnosis not present

## 2019-11-14 DIAGNOSIS — M47814 Spondylosis without myelopathy or radiculopathy, thoracic region: Secondary | ICD-10-CM | POA: Diagnosis not present

## 2019-11-14 DIAGNOSIS — I2510 Atherosclerotic heart disease of native coronary artery without angina pectoris: Secondary | ICD-10-CM | POA: Diagnosis not present

## 2019-11-14 LAB — COMPREHENSIVE METABOLIC PANEL
ALT: 13 U/L (ref 0–44)
AST: 17 U/L (ref 15–41)
Albumin: 3.7 g/dL (ref 3.5–5.0)
Alkaline Phosphatase: 44 U/L (ref 38–126)
Anion gap: 7 (ref 5–15)
BUN: 13 mg/dL (ref 8–23)
CO2: 27 mmol/L (ref 22–32)
Calcium: 9.6 mg/dL (ref 8.9–10.3)
Chloride: 106 mmol/L (ref 98–111)
Creatinine, Ser: 1.26 mg/dL — ABNORMAL HIGH (ref 0.61–1.24)
GFR calc Af Amer: >60 mL/min (ref >60)
GFR calc non Af Amer: 58 mL/min — ABNORMAL LOW (ref >60)
Glucose, Bld: 96 mg/dL (ref 70–99)
Potassium: 4.6 mmol/L (ref 3.5–5.1)
Sodium: 140 mmol/L (ref 135–145)
Total Bilirubin: 0.5 mg/dL (ref 0.3–1.2)
Total Protein: 6.3 g/dL — ABNORMAL LOW (ref 6.5–8.1)

## 2019-11-14 LAB — CBC WITH DIFFERENTIAL/PLATELET
Abs Immature Granulocytes: 0 10*3/uL (ref 0.00–0.07)
Basophils Absolute: 0 10*3/uL (ref 0.0–0.1)
Basophils Relative: 1 %
Eosinophils Absolute: 0.1 10*3/uL (ref 0.0–0.5)
Eosinophils Relative: 3 %
HCT: 32.2 % — ABNORMAL LOW (ref 39.0–52.0)
Hemoglobin: 10.2 g/dL — ABNORMAL LOW (ref 13.0–17.0)
Immature Granulocytes: 0 %
Lymphocytes Relative: 21 %
Lymphs Abs: 0.7 10*3/uL (ref 0.7–4.0)
MCH: 34.9 pg — ABNORMAL HIGH (ref 26.0–34.0)
MCHC: 31.7 g/dL (ref 30.0–36.0)
MCV: 110.3 fL — ABNORMAL HIGH (ref 80.0–100.0)
Monocytes Absolute: 0.4 10*3/uL (ref 0.1–1.0)
Monocytes Relative: 10 %
Neutro Abs: 2.3 10*3/uL (ref 1.7–7.7)
Neutrophils Relative %: 65 %
Platelets: 185 10*3/uL (ref 150–400)
RBC: 2.92 MIL/uL — ABNORMAL LOW (ref 4.22–5.81)
RDW: 13.8 % (ref 11.5–15.5)
WBC: 3.5 10*3/uL — ABNORMAL LOW (ref 4.0–10.5)
nRBC: 0 % (ref 0.0–0.2)

## 2019-11-14 LAB — IRON AND TIBC
Iron: 53 ug/dL (ref 45–182)
Saturation Ratios: 18 % (ref 17.9–39.5)
TIBC: 289 ug/dL (ref 250–450)
UIBC: 236 ug/dL

## 2019-11-14 LAB — TSH: TSH: 1.118 u[IU]/mL (ref 0.350–4.500)

## 2019-11-14 LAB — VITAMIN B12: Vitamin B-12: 2015 pg/mL — ABNORMAL HIGH (ref 180–914)

## 2019-11-14 LAB — VITAMIN D 25 HYDROXY (VIT D DEFICIENCY, FRACTURES): Vit D, 25-Hydroxy: 107.07 ng/mL — ABNORMAL HIGH (ref 30–100)

## 2019-11-14 LAB — LACTATE DEHYDROGENASE: LDH: 131 U/L (ref 98–192)

## 2019-11-14 LAB — FERRITIN: Ferritin: 90 ng/mL (ref 24–336)

## 2019-11-14 MED ORDER — IOHEXOL 300 MG/ML  SOLN
75.0000 mL | Freq: Once | INTRAMUSCULAR | Status: AC | PRN
  Administered 2019-11-14: 75 mL via INTRAVENOUS

## 2019-11-16 ENCOUNTER — Encounter (HOSPITAL_COMMUNITY): Payer: Self-pay | Admitting: Hematology

## 2019-11-16 ENCOUNTER — Inpatient Hospital Stay (HOSPITAL_COMMUNITY): Payer: Medicare Other | Admitting: Hematology

## 2019-11-16 ENCOUNTER — Other Ambulatory Visit (HOSPITAL_COMMUNITY): Payer: Self-pay | Admitting: *Deleted

## 2019-11-16 ENCOUNTER — Other Ambulatory Visit: Payer: Self-pay

## 2019-11-16 VITALS — BP 92/57 | HR 59 | Temp 97.1°F | Resp 17 | Wt 132.3 lb

## 2019-11-16 DIAGNOSIS — C099 Malignant neoplasm of tonsil, unspecified: Secondary | ICD-10-CM

## 2019-11-16 DIAGNOSIS — Z833 Family history of diabetes mellitus: Secondary | ICD-10-CM | POA: Diagnosis not present

## 2019-11-16 DIAGNOSIS — F17210 Nicotine dependence, cigarettes, uncomplicated: Secondary | ICD-10-CM | POA: Diagnosis not present

## 2019-11-16 DIAGNOSIS — R634 Abnormal weight loss: Secondary | ICD-10-CM | POA: Diagnosis not present

## 2019-11-16 DIAGNOSIS — Z79899 Other long term (current) drug therapy: Secondary | ICD-10-CM | POA: Diagnosis not present

## 2019-11-16 DIAGNOSIS — Z800 Family history of malignant neoplasm of digestive organs: Secondary | ICD-10-CM | POA: Diagnosis not present

## 2019-11-16 DIAGNOSIS — R5383 Other fatigue: Secondary | ICD-10-CM | POA: Diagnosis not present

## 2019-11-16 DIAGNOSIS — Z83438 Family history of other disorder of lipoprotein metabolism and other lipidemia: Secondary | ICD-10-CM | POA: Diagnosis not present

## 2019-11-16 DIAGNOSIS — Z809 Family history of malignant neoplasm, unspecified: Secondary | ICD-10-CM | POA: Diagnosis not present

## 2019-11-16 DIAGNOSIS — N289 Disorder of kidney and ureter, unspecified: Secondary | ICD-10-CM | POA: Diagnosis not present

## 2019-11-16 DIAGNOSIS — D509 Iron deficiency anemia, unspecified: Secondary | ICD-10-CM | POA: Diagnosis not present

## 2019-11-16 DIAGNOSIS — Z8249 Family history of ischemic heart disease and other diseases of the circulatory system: Secondary | ICD-10-CM | POA: Diagnosis not present

## 2019-11-16 MED ORDER — DRONABINOL 5 MG PO CAPS: 5.0000 mg | ORAL_CAPSULE | Freq: Two times a day (BID) | ORAL | 0 refills | Status: DC

## 2019-11-16 NOTE — Progress Notes (Signed)
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  NGS Results: Not done  CURRENT THERAPY: Observation  BRIEF ONCOLOGIC HISTORY:  Oncology History  Tonsillar cancer ([facility])  04/07/2019 Initial Diagnosis   Tonsillar cancer ([facility])   05/08/2019 -  Chemotherapy   The patient had palonosetron (ALOXI) injection 0.25 mg, 0.25 mg, Intravenous,  Once, 6 of 7 cycles Administration: 0.25 mg (05/08/2019), 0.25 mg (05/15/2019), 0.25 mg (05/22/2019), 0.25 mg (05/29/2019), 0.25 mg (06/15/2019), 0.25 mg (06/22/2019) CISplatin (PLATINOL) 77 mg in sodium chloride 0.9 % 250 mL chemo infusion, 40 mg/m2 = 77 mg, Intravenous,  Once, 6 of 7 cycles Dose modification: 32 mg/m2 (80 % of original dose 40 mg/m2, Cycle 5, Reason: Other (see comments), Comment: cytopenia) Administration: 77 mg (05/08/2019), 77 mg (05/15/2019), 77 mg (05/22/2019), 77 mg (05/29/2019), 62 mg (06/15/2019), 77 mg (06/22/2019) fosaprepitant (EMEND) 150 mg in sodium chloride 0.9 % 145 mL IVPB, 150 mg, Intravenous,  Once, 6 of 7 cycles Administration: 150 mg (05/08/2019), 150 mg (05/15/2019), 150 mg (05/22/2019), 150 mg (05/29/2019), 150 mg (06/15/2019), 150 mg (06/22/2019)  for chemotherapy treatment.      CANCER STAGING: Cancer Staging Tonsillar cancer ([place]) Staging form: Pharynx - HPV-Mediated Oropharynx, AJCC 8th Edition - Clinical stage from 04/07/2019: Stage I (cT2, cN1, cM0, p16+) - Unsigned   INTERVAL HISTORY:  [name], a 69 y.o. male, returns for routine follow-up of his left tonsillar squamous cell carcinoma. [name] was last seen on 07/19/2019.  Today he reports feeling good. He denies having any cough. He reports having early satiety, eating a half  portion BID and he drinks 1 can of Ensure High Protein daily. He denies N/V, food sticking in his throat or odynophagia; his taste is intact. He started Remeron 30 mg on 9/3 for his appetite since his appetite is depleted.   REVIEW OF SYSTEMS:  Review of Systems  Constitutional: Positive for appetite change (depleted) and fatigue (mild).  HENT:   Negative for trouble swallowing.   Respiratory: Negative for cough.   Gastrointestinal: Negative for nausea and vomiting.  All other systems reviewed and are negative.   PAST MEDICAL/SURGICAL HISTORY:  Past Medical History:  Diagnosis Date  . Allergy   . Anxiety   . Arthritis   . BPH with obstruction/lower urinary tract symptoms   . COPD, mild ([facility])    current smoker  . ED (erectile dysfunction)   . GERD (gastroesophageal reflux disease)   . Hyperlipidemia   . Nocturia   . Osteoporosis   . PAD (peripheral artery disease) ([facility])   . Port-A-Cath in place 04/28/2019   Right  . Prostate cancer ([facility]) 01/11/2012   Adenocarcinoma, Gleason=4+4=8, & 4+5=9,PSA=17.24, Volume= 15.45cc  . PVD (peripheral vascular disease) ([facility])   . Restless leg syndrome   . Tongue mass    SCC   Past Surgical History:  Procedure Laterality Date  . ABDOMINAL AORTOGRAM W/LOWER EXTREMITY N/A 08/16/2018   Procedure: ABDOMINAL AORTOGRAM W/LOWER EXTREMITY;  Surgeon: Serafina Mitchell, MD;  Location: [facility];  Service: Cardiovascular;  Laterality: N/A;  Bilateral  . COLONOSCOPY N/A 09/28/2012   Procedure: COLONOSCOPY;  Surgeon: Danie Binder, MD;  [facility];  Service: Endoscopy;  Laterality: N/A;  8:30  . DIRECT LARYNGOSCOPY N/A 04/03/2019   Procedure: DIRECT LARYNGOSCOPY;  Surgeon: Leta Baptist, MD;  Location: [facility];  Service: ENT;  Laterality: N/A;  . ESOPHAGOGASTRODUODENOSCOPY N/A 09/28/2012   Procedure: ESOPHAGOGASTRODUODENOSCOPY (EGD);  Surgeon: Danie Binder, MD;  [facility];  Service: Endoscopy;  Laterality: N/A;    . Gold seed implatation  04/28/2012  . HERNIA REPAIR     Bilateral inguinal X2  . IR IMAGING GUIDED PORT INSERTION  05/03/2019   Right  . JOINT REPLACEMENT     bilateral  . KNEE SURGERY     Left Knee X 2   and Right knee X1  . MALONEY DILATION N/A 09/28/2012   Procedure: MALONEY DILATION;  Surgeon: Danie Binder, MD;  [facility];  Service: Endoscopy;  Laterality: N/A;  . NOSE SURGERY    . PERIPHERAL VASCULAR INTERVENTION  08/16/2018   Procedure: PERIPHERAL VASCULAR INTERVENTION;  Surgeon: Serafina Mitchell, MD;  Location: [facility];  Service: Cardiovascular;;  LT Iliac  . PR VEIN BYPASS GRAFT,AORTO-FEM-POP  10/21/10   Left AK to BK popliteal BPG  . PROSTATE BIOPSY  01/11/2012   Adenocarcinoma  . PROSTATE SURGERY  2015   Chemo and  Radiation  . SAVORY DILATION N/A 09/28/2012   Procedure: SAVORY DILATION;  Surgeon: Danie Binder, MD;  [facility];  Service: Endoscopy;  Laterality: N/A;  . throat biopsy  03/20/2019  . TONSILLECTOMY Left 04/03/2019   Procedure: TONSILLECTOMY;  Surgeon: Leta Baptist, MD;  Location: [facility];  Service: ENT;  Laterality: Left;    SOCIAL HISTORY:  Social History   Socioeconomic History  . Marital status: Married    Spouse name: Holley Raring  . Number of children: 2  . Years of education: College  . 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 Smoking status: Current Every Day Smoker    Packs/day: 0.50    Years: 43.00    Pack years: 21.50    Types: Cigarettes  . Smokeless tobacco: Never Used  . Tobacco comment: 1/2 ppd  Vaping Use  . Vaping Use: Never used  Substance and Sexual Activity  . Alcohol use: No    Comment: quit 1.5 years ago  . Drug use: No  . Sexual activity: Not Currently  Other Topics Concern  . Not on file  Social History Narrative   Patient lives at home  with spouse.   Caffeine use: very little   Social Determinants of Health   Financial Resource Strain: Low Risk   . Difficulty of Paying Living Expenses: Not hard at all  Food Insecurity: No Food Insecurity  . Worried About Charity fundraiser in the Last Year: Never true  . Ran Out of Food in the Last Year: Never true  Transportation Needs: No Transportation Needs  . Lack of Transportation (Medical): No  . Lack of Transportation (Non-Medical): No  Physical Activity: Sufficiently Active  . Days of Exercise per Week: 5 days  . Minutes of Exercise per Session: 30 min  Stress: Stress Concern Present  . Feeling of Stress : Rather much  Social Connections: Moderately Integrated  . Frequency of Communication with Friends and Family: Twice a week  . Frequency of Social Gatherings with Friends and  Family: Twice a week  . Attends Religious Services: More than 4 times per year  . Active Member of Clubs or Organizations: No  . Attends Archivist Meetings: Never  . Marital Status: Married  Human resources officer Violence: Not At Risk  . Fear of Current or Ex-Partner: No  . Emotionally Abused: No  . Physically Abused: No  . Sexually Abused: No    FAMILY HISTORY:  Family History  Problem Relation Age of Onset  . Heart disease Mother        Valve regurgitation and Pacemaker   . Diabetes Mother   . Hyperlipidemia Mother   . Heart disease Father        CABG x 5  . Hyperlipidemia Father   . Hypertension Father   . Heart attack Father   . Heart disease Sister        aortic valve replacement  . Cancer Sister 31       Colon cancer w/ metastasis  . Throat cancer Sister   . Hypertension Brother   . Heart disease Maternal Grandmother   . Heart disease Maternal Grandfather   . Heart disease Paternal Grandmother   . Cancer Paternal Grandfather   . Healthy Son   . Healthy Daughter     CURRENT MEDICATIONS:  Current Outpatient Medications  Medication Sig Dispense Refill  . aspirin  EC 81 MG tablet Take 81 mg by mouth daily.    . calcium carbonate (OS-CAL) 600 MG tablet Take 1 tablet (600 mg total) by mouth 2 (two) times daily with a meal. (Patient taking differently: Take 600 mg by mouth 2 (two) times daily. ) 30 tablet   . Cholecalciferol (VITAMIN D) 50 MCG (2000 UT) tablet Take 2,000 Units by mouth in the morning and at bedtime.    . clopidogrel (PLAVIX) 75 MG tablet TAKE 1 TABLET BY MOUTH ONCE A DAY. 30 tablet 0  . cyanocobalamin 1000 MCG tablet Take 1,000 mcg by mouth 2 (two) times daily.    . famotidine (PEPCID) 20 MG tablet TAKE 1 TABLET BY MOUTH AT BEDTIME. 90 tablet 0  . HYDROcodone-acetaminophen (NORCO) 10-325 MG tablet TAKE 1 TABLET BY MOUTH EVERY FOUR HOURS AS NEEDED. 120 tablet 0  . magnesium oxide (MAG-OX) 400 (241.3 Mg) MG tablet Take 1 tablet (400 mg total) by mouth 2 (two) times daily. 60 tablet 1  . methocarbamol (ROBAXIN) 500 MG tablet Take 500 mg by mouth every 6 (six) hours as needed for muscle spasms.     . mirtazapine (REMERON) 30 MG tablet Take 1 tablet (30 mg total) by mouth at bedtime. 30 tablet 3  . pantoprazole (PROTONIX) 40 MG tablet TAKE ONE TABLET BY MOUTH DAILY. 90 tablet 0  . rOPINIRole (REQUIP) 3 MG tablet TAKE (1) TABLET BY MOUTH AT BEDTIME. 30 tablet 6  . simvastatin (ZOCOR) 40 MG tablet TAKE (1) TABLET BY MOUTH AT BEDTIME. 90 tablet 3   No current facility-administered medications for this visit.    ALLERGIES:  Allergies  Allergen Reactions  . Codeine Other (See Comments)    Bad headache and sweats  . Morphine And Related Itching    sweats  . Percocet [Oxycodone-Acetaminophen] Other (See Comments)    Headache and sweats    PHYSICAL EXAM:  Performance status (ECOG): 1 - Symptomatic but completely ambulatory  Vitals:   11/16/19 1105  BP: (!) 92/57  Pulse: (!) 59  Resp: 17  Temp: (!) 97.1 F (36.2 C)  SpO2: 100%   Wt Readings  from Last 3 Encounters:  11/16/19 132 lb 4.8 oz (60 kg)  10/27/19 134 lb (60.8 kg)  09/15/19  139 lb 6.4 oz (63.2 kg)   Physical Exam Vitals reviewed.  Constitutional:      Appearance: Normal appearance.  HENT:     Mouth/Throat:     Lips: No lesions.     Mouth: No oral lesions.     Dentition: No gum lesions.     Tongue: No lesions.     Palate: No mass.     Tonsils: No tonsillar exudate or tonsillar abscesses.  Cardiovascular:     Rate and Rhythm: Normal rate and regular rhythm.     Pulses: Normal pulses.     Heart sounds: Normal heart sounds.  Pulmonary:     Effort: Pulmonary effort is normal.     Breath sounds: Normal breath sounds.  Abdominal:     Palpations: Abdomen is soft. There is no hepatomegaly, splenomegaly or mass.     Tenderness: There is no abdominal tenderness.     Hernia: No hernia is present.  Lymphadenopathy:     Head:     Right side of head: No submental or submandibular adenopathy.     Left side of head: No submental or submandibular adenopathy.     Cervical: No cervical adenopathy.     Upper Body:     Right upper body: No supraclavicular, axillary or pectoral adenopathy.     Left upper body: No supraclavicular, axillary or pectoral adenopathy.     Lower Body: No right inguinal adenopathy. No left inguinal adenopathy.  Neurological:     General: No focal deficit present.     Mental Status: He is alert and oriented to person, place, and time.  Psychiatric:        Mood and Affect: Mood normal.        Behavior: Behavior normal.      LABORATORY DATA:  I have reviewed the labs as listed.  CBC Latest Ref Rng & Units 11/14/2019 10/27/2019 09/13/2019  WBC 4.0 - 10.5 K/uL 3.5(L) 3.5(L) 3.0(L)  Hemoglobin 13.0 - 17.0 g/dL 10.2(L) 10.6(L) 9.7(L)  Hematocrit 39 - 52 % 32.2(L) 32.3(L) 30.3(L)  Platelets 150 - 400 K/uL 185 187 164   CMP Latest Ref Rng & Units 11/14/2019 10/27/2019 09/13/2019  Glucose 70 - 99 mg/dL 96 85 99  BUN 8 - 23 mg/dL 13 13 15   Creatinine 0.61 - 1.24 mg/dL 1.26(H) 0.96 0.96  Sodium 135 - 145 mmol/L 140 141 140  Potassium 3.5 - 5.1  mmol/L 4.6 4.8 4.5  Chloride 98 - 111 mmol/L 106 105 105  CO2 22 - 32 mmol/L 27 27 26   Calcium 8.9 - 10.3 mg/dL 9.6 9.4 9.3  Total Protein 6.5 - 8.1 g/dL 6.3(L) 6.2 6.6  Total Bilirubin 0.3 - 1.2 mg/dL 0.5 0.3 0.5  Alkaline Phos 38 - 126 U/L 44 - 48  AST 15 - 41 U/L 17 15 13(L)  ALT 0 - 44 U/L 13 9 10    Lab Results  Component Value Date   LDH 131 11/14/2019   LDH 111 09/13/2019   Lab Results  Component Value Date   VD25OH 107.07 (H) 11/14/2019   VD25OH 95.44 09/13/2019   VD25OH 49 07/14/2016   Lab Results  Component Value Date   TIBC 289 11/14/2019   TIBC 309 09/13/2019   FERRITIN 90 11/14/2019   FERRITIN 150 09/13/2019   IRONPCTSAT 18 11/14/2019   IRONPCTSAT 23 09/13/2019    DIAGNOSTIC IMAGING:  I have independently reviewed the scans and discussed with the patient. CT CHEST W CONTRAST  Result Date: 11/14/2019 CLINICAL DATA:  Head neck cancer status post chemotherapy. Follow-up pulmonary nodule. EXAM: CT CHEST WITH CONTRAST TECHNIQUE: Multidetector CT imaging of the chest was performed during intravenous contrast administration. CONTRAST:  94mL OMNIPAQUE IOHEXOL 300 MG/ML  SOLN COMPARISON:  08/14/2019 PET-CT.  02/23/2019 chest CT. FINDINGS: Cardiovascular: Normal heart size. No significant pericardial effusion/thickening. Left anterior descending and right coronary atherosclerosis. Right internal jugular Port-A-Cath terminates in the lower third of the SVC. Atherosclerotic nonaneurysmal thoracic aorta. Normal caliber pulmonary arteries. No central pulmonary emboli. Mediastinum/Nodes: No discrete thyroid nodules. Unremarkable esophagus. No pathologically enlarged axillary, mediastinal or hilar lymph nodes. Lungs/Pleura: No pneumothorax. No pleural effusion. Mild centrilobular and paraseptal emphysema with mild diffuse bronchial wall thickening. Previously visualized 1.6 cm posterior right lower lobe pulmonary nodule on 08/14/2019 PET-CT study has resolved with thin residual linear  scar in this location (series 4/image 99). Minimal adjacent patchy tree-in-bud opacities compatible with nonspecific bronchiolitis, also potentially postinflammatory. A few scattered small solid pulmonary nodules in both lungs, largest 5 mm in the peripheral left lower lobe (series 4/image 111), all stable since 02/23/2019 chest CT. No new significant pulmonary nodules. No acute consolidative airspace disease or lung masses. Upper abdomen: Right adrenal 2.3 cm nodule with density 49 HU, stable, previously non hypermetabolic, compatible with an adenoma. Musculoskeletal: No aggressive appearing focal osseous lesions. Moderate thoracic spondylosis. IMPRESSION: 1. No findings suspicious for metastatic disease in the chest. 2. Previously visualized 1.6 cm posterior right lower lobe pulmonary nodule on 08/14/2019 PET-CT study has resolved. 3. Scattered small solid pulmonary nodules in both lungs are all stable since 02/23/2019 chest CT, probably benign. 4. Stable right adrenal adenoma. 5. Aortic Atherosclerosis (ICD10-I70.0) and Emphysema (ICD10-J43.9). Electronically Signed   By: Ilona Sorrel M.D.   On: 11/14/2019 12:23     ASSESSMENT:  1.  T2N1 squamous cell carcinoma the left tonsil, p16 positive: -PET scan on 03/27/2019 shows uptake in the left tonsillar fossa, 1.5 cm lymph node at the level 2 on the left, no other metastatic disease. -Chemoradiation therapy with cisplatin weekly from 05/08/2019 through 06/26/2019.  2.  Prostate cancer: -Gleason 9 prostate cancer, diagnosed in 2013, status post XRT from April 2014 through May 2014 with 2 years of Lupron.  PSA is normal.   PLAN:  1.  Left tonsillar squamous cell carcinoma: -Physical exam today did not reveal any palpable adenopathy.  No masses in the oropharyngeal area. -CT of the chest from 11/14/2019 reviewed by me showed previously visualized 1.6 cm posterior right lower lobe pulmonary nodule is resolved.  Scattered small solid pulmonary nodules in both  lungs all stable from December 2020 chest CT.  No other findings of metastatic disease. -RTC in the 1st week of December with CT soft tissue neck.  2.  Nutrition/weight loss: -He is eating twice daily, half portions.  He is drinking 1 can of Ensure Plus.  He has been taking Remeron for the past 3 years.  Dose was increased 2 weeks ago but he did not notice any improvement in appetite. -He lost some weight.  We will add Marinol 5 mg twice daily.  Recommended increasing Ensure to 2 cans/day.  3.  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  Creatinine today is 1.6.  4.  Normocytic anemia: -Hemoglobin today is 10.2.  Ferritin is 98 and percent saturation is 18.  B12 is normal. -We will consider parenteral iron therapy if hemoglobin  drops below 10.  5.  Hypomagnesemia: -Continue magnesium supplements.   Orders placed this encounter:  No orders of the defined types were placed in this encounter.    Derek Jack, MD [place] [phone]   I, Milinda Antis, am acting as a scribe for Dr. Sanda Linger.  I, Derek Jack MD, have reviewed the above documentation for accuracy and completeness, and I agree with the above.

## 2019-11-16 NOTE — Patient Instructions (Signed)
[place] at [facility] Discharge Instructions  You were seen today by Dr. Delton Coombes. He went over your recent results and scans. You will be prescribed Marinol 5 mg twice daily to improve your appetite. Drink at least 2 cans of Ensure daily. You will be scheduled for a CT scan of your neck before your next visit. Dr. Delton Coombes will see you back the first week of December for labs and follow up.   Thank you for choosing [place] at [facility] to provide your oncology and hematology care.  To afford each patient quality time with our provider, please arrive at least 15 minutes before your scheduled appointment time.   If you have a lab appointment with the [facility] please come in thru the Main Entrance and check in at the main information desk  You need to re-schedule your appointment should you arrive 10 or more minutes late.  We strive to give you quality time with our providers, and arriving late affects you and other patients whose appointments are after yours.  Also, if you no show three or more times for appointments you may be dismissed from the clinic at the providers discretion.     Again, thank you for choosing [facility].  Our hope is that these requests will decrease the amount of time that you wait before being seen by our physicians.       _____________________________________________________________  Should you have questions after your visit to [facility], please contact our office at [phone] between the hours of 8:00 a.m. and 4:30 p.m.  Voicemails left after 4:00 p.m. will not be returned until the following business day.  For prescription refill requests, have your pharmacy contact our office and allow 72 hours.    Cancer Center Support Programs:   > Cancer Support Group  2nd Tuesday of the month 1pm-2pm, Journey Room

## 2019-11-21 ENCOUNTER — Ambulatory Visit (HOSPITAL_COMMUNITY): Payer: Medicare Other | Admitting: Hematology

## 2019-11-24 ENCOUNTER — Other Ambulatory Visit: Payer: Self-pay | Admitting: Vascular Surgery

## 2019-11-24 ENCOUNTER — Other Ambulatory Visit: Payer: Self-pay | Admitting: Family Medicine

## 2019-11-24 NOTE — Telephone Encounter (Signed)
Ok to refill Hydrocodone/APAP??  Last office visit 10/27/2019.  Last refill 09/29/2019.

## 2019-12-14 ENCOUNTER — Other Ambulatory Visit: Payer: Self-pay | Admitting: Family Medicine

## 2019-12-27 ENCOUNTER — Other Ambulatory Visit: Payer: Self-pay | Admitting: Family Medicine

## 2019-12-27 NOTE — Telephone Encounter (Signed)
Please Advise

## 2019-12-29 ENCOUNTER — Telehealth (HOSPITAL_COMMUNITY): Payer: Self-pay

## 2019-12-29 NOTE — Telephone Encounter (Signed)
Nutrition  Patient identified on Malnutrition screening tool for weight loss and poor appetite.   Phone call to patient but no answer.  Left message with call back number.   RD last contact was on 7/2 and message was left with call back number.   Jaime Grizzell B. Zenia Resides, [place], [place] Registered Dietitian [phone] (mobile)

## 2020-01-09 ENCOUNTER — Other Ambulatory Visit: Payer: Self-pay | Admitting: Family Medicine

## 2020-01-22 ENCOUNTER — Other Ambulatory Visit: Payer: Self-pay

## 2020-01-22 ENCOUNTER — Ambulatory Visit (HOSPITAL_COMMUNITY)
Admission: RE | Admit: 2020-01-22 | Discharge: 2020-01-22 | Disposition: A | Discharge status: Home / Self Care | Payer: Medicare Other | Source: Ambulatory Visit | Attending: Hematology | Admitting: Hematology

## 2020-01-22 ENCOUNTER — Inpatient Hospital Stay (HOSPITAL_COMMUNITY): Payer: Medicare Other | Attending: Hematology

## 2020-01-22 DIAGNOSIS — C099 Malignant neoplasm of tonsil, unspecified: Secondary | ICD-10-CM | POA: Diagnosis not present

## 2020-01-22 DIAGNOSIS — F17210 Nicotine dependence, cigarettes, uncomplicated: Secondary | ICD-10-CM | POA: Insufficient documentation

## 2020-01-22 DIAGNOSIS — Z800 Family history of malignant neoplasm of digestive organs: Secondary | ICD-10-CM | POA: Diagnosis not present

## 2020-01-22 DIAGNOSIS — N289 Disorder of kidney and ureter, unspecified: Secondary | ICD-10-CM | POA: Insufficient documentation

## 2020-01-22 DIAGNOSIS — R5383 Other fatigue: Secondary | ICD-10-CM | POA: Insufficient documentation

## 2020-01-22 DIAGNOSIS — D509 Iron deficiency anemia, unspecified: Secondary | ICD-10-CM | POA: Diagnosis not present

## 2020-01-22 DIAGNOSIS — Z79899 Other long term (current) drug therapy: Secondary | ICD-10-CM | POA: Diagnosis not present

## 2020-01-22 DIAGNOSIS — M5020 Other cervical disc displacement, unspecified cervical region: Secondary | ICD-10-CM | POA: Diagnosis not present

## 2020-01-22 DIAGNOSIS — Z809 Family history of malignant neoplasm, unspecified: Secondary | ICD-10-CM | POA: Insufficient documentation

## 2020-01-22 DIAGNOSIS — Z833 Family history of diabetes mellitus: Secondary | ICD-10-CM | POA: Diagnosis not present

## 2020-01-22 DIAGNOSIS — Z8249 Family history of ischemic heart disease and other diseases of the circulatory system: Secondary | ICD-10-CM | POA: Insufficient documentation

## 2020-01-22 DIAGNOSIS — M50323 Other cervical disc degeneration at C6-C7 level: Secondary | ICD-10-CM | POA: Diagnosis not present

## 2020-01-22 DIAGNOSIS — R634 Abnormal weight loss: Secondary | ICD-10-CM | POA: Diagnosis not present

## 2020-01-22 DIAGNOSIS — R600 Localized edema: Secondary | ICD-10-CM | POA: Diagnosis not present

## 2020-01-22 DIAGNOSIS — Z83438 Family history of other disorder of lipoprotein metabolism and other lipidemia: Secondary | ICD-10-CM | POA: Insufficient documentation

## 2020-01-22 DIAGNOSIS — Z8546 Personal history of malignant neoplasm of prostate: Secondary | ICD-10-CM | POA: Insufficient documentation

## 2020-01-22 LAB — CBC WITH DIFFERENTIAL/PLATELET
Abs Immature Granulocytes: 0 10*3/uL (ref 0.00–0.07)
Basophils Absolute: 0 10*3/uL (ref 0.0–0.1)
Basophils Relative: 1 %
Eosinophils Absolute: 0.2 10*3/uL (ref 0.0–0.5)
Eosinophils Relative: 6 %
HCT: 33.8 % — ABNORMAL LOW (ref 39.0–52.0)
Hemoglobin: 10.7 g/dL — ABNORMAL LOW (ref 13.0–17.0)
Immature Granulocytes: 0 %
Lymphocytes Relative: 25 %
Lymphs Abs: 1 10*3/uL (ref 0.7–4.0)
MCH: 33.8 pg (ref 26.0–34.0)
MCHC: 31.7 g/dL (ref 30.0–36.0)
MCV: 106.6 fL — ABNORMAL HIGH (ref 80.0–100.0)
Monocytes Absolute: 0.4 10*3/uL (ref 0.1–1.0)
Monocytes Relative: 10 %
Neutro Abs: 2.3 10*3/uL (ref 1.7–7.7)
Neutrophils Relative %: 58 %
Platelets: 191 10*3/uL (ref 150–400)
RBC: 3.17 MIL/uL — ABNORMAL LOW (ref 4.22–5.81)
RDW: 17.2 % — ABNORMAL HIGH (ref 11.5–15.5)
WBC: 4 10*3/uL (ref 4.0–10.5)
nRBC: 0 % (ref 0.0–0.2)

## 2020-01-22 LAB — COMPREHENSIVE METABOLIC PANEL
ALT: 13 U/L (ref 0–44)
AST: 16 U/L (ref 15–41)
Albumin: 3.8 g/dL (ref 3.5–5.0)
Alkaline Phosphatase: 42 U/L (ref 38–126)
Anion gap: 8 (ref 5–15)
BUN: 15 mg/dL (ref 8–23)
CO2: 27 mmol/L (ref 22–32)
Calcium: 9.1 mg/dL (ref 8.9–10.3)
Chloride: 101 mmol/L (ref 98–111)
Creatinine, Ser: 1.09 mg/dL (ref 0.61–1.24)
GFR, Estimated: >60 mL/min (ref >60)
Glucose, Bld: 118 mg/dL — ABNORMAL HIGH (ref 70–99)
Potassium: 3.8 mmol/L (ref 3.5–5.1)
Sodium: 136 mmol/L (ref 135–145)
Total Bilirubin: 0.5 mg/dL (ref 0.3–1.2)
Total Protein: 6.6 g/dL (ref 6.5–8.1)

## 2020-01-22 LAB — MAGNESIUM: Magnesium: 2 mg/dL (ref 1.7–2.4)

## 2020-01-22 MED ORDER — IOHEXOL 300 MG/ML  SOLN
75.0000 mL | Freq: Once | INTRAMUSCULAR | Status: AC | PRN
  Administered 2020-01-22: 75 mL via INTRAVENOUS

## 2020-01-25 ENCOUNTER — Other Ambulatory Visit: Payer: Self-pay

## 2020-01-25 ENCOUNTER — Inpatient Hospital Stay (HOSPITAL_COMMUNITY): Payer: Medicare Other | Attending: Hematology | Admitting: Hematology

## 2020-01-25 VITALS — BP 93/63 | HR 65 | Resp 16 | Wt 136.2 lb

## 2020-01-25 DIAGNOSIS — Z800 Family history of malignant neoplasm of digestive organs: Secondary | ICD-10-CM | POA: Insufficient documentation

## 2020-01-25 DIAGNOSIS — F17210 Nicotine dependence, cigarettes, uncomplicated: Secondary | ICD-10-CM | POA: Diagnosis not present

## 2020-01-25 DIAGNOSIS — Z79899 Other long term (current) drug therapy: Secondary | ICD-10-CM | POA: Diagnosis not present

## 2020-01-25 DIAGNOSIS — C099 Malignant neoplasm of tonsil, unspecified: Secondary | ICD-10-CM | POA: Diagnosis not present

## 2020-01-25 DIAGNOSIS — Z83438 Family history of other disorder of lipoprotein metabolism and other lipidemia: Secondary | ICD-10-CM | POA: Diagnosis not present

## 2020-01-25 DIAGNOSIS — R634 Abnormal weight loss: Secondary | ICD-10-CM | POA: Insufficient documentation

## 2020-01-25 DIAGNOSIS — D509 Iron deficiency anemia, unspecified: Secondary | ICD-10-CM | POA: Diagnosis not present

## 2020-01-25 DIAGNOSIS — Z833 Family history of diabetes mellitus: Secondary | ICD-10-CM | POA: Insufficient documentation

## 2020-01-25 DIAGNOSIS — Z923 Personal history of irradiation: Secondary | ICD-10-CM | POA: Insufficient documentation

## 2020-01-25 DIAGNOSIS — Z8249 Family history of ischemic heart disease and other diseases of the circulatory system: Secondary | ICD-10-CM | POA: Insufficient documentation

## 2020-01-25 DIAGNOSIS — Z809 Family history of malignant neoplasm, unspecified: Secondary | ICD-10-CM | POA: Insufficient documentation

## 2020-01-25 DIAGNOSIS — Z9221 Personal history of antineoplastic chemotherapy: Secondary | ICD-10-CM | POA: Insufficient documentation

## 2020-01-25 DIAGNOSIS — Z8546 Personal history of malignant neoplasm of prostate: Secondary | ICD-10-CM | POA: Insufficient documentation

## 2020-01-25 NOTE — Progress Notes (Signed)
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)   05/08/2019 -  Chemotherapy   The patient had palonosetron (ALOXI) injection 0.25 mg, 0.25 mg, Intravenous,  Once, 6 of 7 cycles Administration: 0.25 mg (05/08/2019), 0.25 mg (05/15/2019), 0.25 mg (05/22/2019), 0.25 mg (05/29/2019), 0.25 mg (06/15/2019), 0.25 mg (06/22/2019) CISplatin (PLATINOL) 77 mg in sodium chloride 0.9 % 250 mL chemo infusion, 40 mg/m2 = 77 mg, Intravenous,  Once, 6 of 7 cycles Dose modification: 32 mg/m2 (80 % of original dose 40 mg/m2, Cycle 5, Reason: Other (see comments), Comment: cytopenia) Administration: 77 mg (05/08/2019), 77 mg (05/15/2019), 77 mg (05/22/2019), 77 mg (05/29/2019), 62 mg (06/15/2019), 77 mg (06/22/2019) fosaprepitant (EMEND) 150 mg in sodium chloride 0.9 % 145 mL IVPB, 150 mg, Intravenous,  Once, 6 of 7 cycles Administration: 150 mg (05/08/2019), 150 mg (05/15/2019), 150 mg (05/22/2019), 150 mg (05/29/2019), 150 mg (06/15/2019), 150 mg (06/22/2019)  for chemotherapy treatment.      CANCER STAGING: Cancer Staging Tonsillar cancer ([place]) Staging form: Pharynx - HPV-Mediated Oropharynx, AJCC 8th Edition - Clinical stage from 04/07/2019: Stage I (cT2, cN1, cM0, p16+) - Unsigned   INTERVAL HISTORY:  [name], a 69 y.o. male, returns for routine follow-up of his left tonsillar squamous cell carcinoma. Cory Foley was last seen on 11/16/2019.   Today he is accompanied by his wife and he reports feeling well. He denies having any numbness or tingling,  though he continues having occasional tinnitus. He denies having any nosebleeds, hematochezia or hematuria. His appetite is improving and he eating 3 small meals and drinking 2 cans of Ensure daily.  He saw Dr. Benjamine Foley in [place].   REVIEW OF SYSTEMS:  Review of Systems  Constitutional: Positive for appetite change (85%) and fatigue (50%).  HENT:   Positive for tinnitus (occasional).   Genitourinary: Positive for frequency.   Neurological: Positive for headaches (occasional).  Psychiatric/Behavioral: Positive for sleep disturbance.  All other systems reviewed and are negative.   PAST MEDICAL/SURGICAL HISTORY:  Past Medical History:  Diagnosis Date  . Allergy   . Anxiety   . Arthritis   . BPH with obstruction/lower urinary tract symptoms   . COPD, mild ([facility])    current smoker  . ED (erectile dysfunction)   . GERD (gastroesophageal reflux disease)   . Hyperlipidemia   . Nocturia   . Osteoporosis   . PAD (peripheral artery disease) ([facility])   . Port-A-Cath in place 04/28/2019   Right  . Prostate cancer ([facility]) 01/11/2012   Adenocarcinoma, Gleason=4+4=8, & 4+5=9,PSA=17.24, Volume= 15.45cc  . PVD (peripheral vascular disease) ([facility])   . Restless leg syndrome   . Tongue mass    SCC   Past Surgical History:  Procedure Laterality Date  . ABDOMINAL AORTOGRAM W/LOWER EXTREMITY N/A 08/16/2018   Procedure: ABDOMINAL AORTOGRAM W/LOWER EXTREMITY;  Surgeon: Cory Mitchell, MD;  Location: [facility];  Service: Cardiovascular;  Laterality: N/A;  Bilateral  . COLONOSCOPY N/A 09/28/2012   Procedure: COLONOSCOPY;  Surgeon: Cory Binder, MD;  [facility];  Service: Endoscopy;  Laterality: N/A;  8:30  . DIRECT LARYNGOSCOPY N/A 04/03/2019   Procedure: DIRECT LARYNGOSCOPY;  Surgeon: Cory Baptist, MD;  Location: [facility];  Service: ENT;  Laterality: N/A;  . ESOPHAGOGASTRODUODENOSCOPY N/A 09/28/2012   Procedure: ESOPHAGOGASTRODUODENOSCOPY (EGD);  Surgeon: Cory Binder,  MD;  [facility];  Service: Endoscopy;  Laterality: N/A;  . Gold seed implatation  04/28/2012  . HERNIA REPAIR     Bilateral inguinal X2  . IR IMAGING GUIDED PORT INSERTION  05/03/2019   Right  . JOINT REPLACEMENT     bilateral  . KNEE SURGERY     Left Knee X 2   and Right knee X1  . MALONEY DILATION N/A 09/28/2012   Procedure: MALONEY DILATION;  Surgeon: Cory Binder, MD;  [facility];  Service: Endoscopy;  Laterality: N/A;  . NOSE SURGERY    . PERIPHERAL VASCULAR INTERVENTION  08/16/2018   Procedure: PERIPHERAL VASCULAR INTERVENTION;  Surgeon: Cory Mitchell, MD;  Location: [facility];  Service: Cardiovascular;;  LT Iliac  . PR VEIN BYPASS GRAFT,AORTO-FEM-POP  10/21/10   Left AK to BK popliteal BPG  . PROSTATE BIOPSY  01/11/2012   Adenocarcinoma  . PROSTATE SURGERY  2015   Chemo and  Radiation  . SAVORY DILATION N/A 09/28/2012   Procedure: SAVORY DILATION;  Surgeon: Cory Binder, MD;  [facility];  Service: Endoscopy;  Laterality: N/A;  . throat biopsy  03/20/2019  . TONSILLECTOMY Left 04/03/2019   Procedure: TONSILLECTOMY;  Surgeon: Cory Baptist, MD;  Location: [facility];  Service: ENT;  Laterality: Left;    SOCIAL HISTORY:  Social History   Socioeconomic History  . Marital status: Married    Spouse name: Cory Foley  . Number of children: 2  . Years of education: College  . 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 Smoking status: Current Every Day Smoker    Packs/day: 0.50    Years: 43.00    Pack years: 21.50    Types: Cigarettes  . Smokeless tobacco: Never Used  . Tobacco comment: 1/2 ppd  Vaping Use  . Vaping Use: Never used  Substance and Sexual Activity  . Alcohol use: No    Comment: quit 1.5 years ago  . Drug use: No  . Sexual activity: Not Currently  Other Topics Concern    . Not on file  Social History Narrative   Patient lives at home with spouse.   Caffeine use: very little   Social Determinants of Health   Financial Resource Strain: Low Risk   . Difficulty of Paying Living Expenses: Not hard at all  Food Insecurity: No Food Insecurity  . Worried About Charity fundraiser in the Last Year: Never true  . Ran Out of Food in the Last Year: Never true  Transportation Needs: No Transportation Needs  . Lack of Transportation (Medical): No  . Lack of Transportation (Non-Medical): No  Physical Activity: Sufficiently Active  . Days of Exercise per Week: 5 days  . Minutes of Exercise per Session: 30 min  Stress: Stress Concern Present  . Feeling of Stress : Rather much  Social Connections: Moderately Integrated  . Frequency of Communication with Friends and Family: Twice a week  . Frequency of Social  Gatherings with Friends and Family: Twice a week  . Attends Religious Services: More than 4 times per year  . Active Member of Clubs or Organizations: No  . Attends Archivist Meetings: Never  . Marital Status: Married  Human resources officer Violence: Not At Risk  . Fear of Current or Ex-Partner: No  . Emotionally Abused: No  . Physically Abused: No  . Sexually Abused: No    FAMILY HISTORY:  Family History  Problem Relation Age of Onset  . Heart disease Mother        Valve regurgitation and Pacemaker   . Diabetes Mother   . Hyperlipidemia Mother   . Heart disease Father        CABG x 5  . Hyperlipidemia Father   . Hypertension Father   . Heart attack Father   . Heart disease Sister        aortic valve replacement  . Cancer Sister 8       Colon cancer w/ metastasis  . Throat cancer Sister   . Hypertension Brother   . Heart disease Maternal Grandmother   . Heart disease Maternal Grandfather   . Heart disease Paternal Grandmother   . Cancer Paternal Grandfather   . Healthy Son   . Healthy Daughter     CURRENT MEDICATIONS:  Current  Outpatient Medications  Medication Sig Dispense Refill  . aspirin EC 81 MG tablet Take 81 mg by mouth daily.    . calcium carbonate (OS-CAL) 600 MG tablet Take 1 tablet (600 mg total) by mouth 2 (two) times daily with a meal. (Patient taking differently: Take 600 mg by mouth 2 (two) times daily. ) 30 tablet   . Cholecalciferol (VITAMIN D) 50 MCG (2000 UT) tablet Take 2,000 Units by mouth in the morning and at bedtime.    . clopidogrel (PLAVIX) 75 MG tablet TAKE 1 TABLET BY MOUTH ONCE A DAY. 30 tablet 6  . cyanocobalamin 1000 MCG tablet Take 1,000 mcg by mouth 2 (two) times daily.    Marland Kitchen dronabinol (MARINOL) 5 MG capsule Take 1 capsule (5 mg total) by mouth 2 (two) times daily before a meal. 60 capsule 0  . famotidine (PEPCID) 20 MG tablet TAKE 1 TABLET BY MOUTH AT BEDTIME. 90 tablet 0  . HYDROcodone-acetaminophen (NORCO) 10-325 MG tablet TAKE 1 TABLET BY MOUTH EVERY FOUR HOURS AS NEEDED. 120 tablet 0  . magnesium oxide (MAG-OX) 400 (241.3 Mg) MG tablet Take 1 tablet (400 mg total) by mouth 2 (two) times daily. 60 tablet 1  . methocarbamol (ROBAXIN) 500 MG tablet Take 500 mg by mouth every 6 (six) hours as needed for muscle spasms.     . mirtazapine (REMERON) 30 MG tablet Take 1 tablet (30 mg total) by mouth at bedtime. 30 tablet 3  . pantoprazole (PROTONIX) 40 MG tablet TAKE ONE TABLET BY MOUTH DAILY. 90 tablet 0  . rOPINIRole (REQUIP) 3 MG tablet TAKE (1) TABLET BY MOUTH AT BEDTIME. 30 tablet 6  . simvastatin (ZOCOR) 40 MG tablet TAKE (1) TABLET BY MOUTH AT BEDTIME. 90 tablet 3   No current facility-administered medications for this visit.    ALLERGIES:  Allergies  Allergen Reactions  . Codeine Other (See Comments)    Bad headache and sweats  . Morphine And Related Itching    sweats  . Percocet [Oxycodone-Acetaminophen] Other (See Comments)    Headache and sweats    PHYSICAL EXAM:  Performance status (ECOG): 1 - Symptomatic but completely ambulatory  Vitals:  01/25/20 1208  BP:  93/63  Pulse: 65  Resp: 16  SpO2: 98%   Wt Readings from Last 3 Encounters:  01/25/20 136 lb 3.9 oz (61.8 kg)  11/16/19 132 lb 4.8 oz (60 kg)  10/27/19 134 lb (60.8 kg)   Physical Exam Vitals reviewed.  Constitutional:      Appearance: Normal appearance.  HENT:     Mouth/Throat:     Lips: No lesions.     Mouth: No oral lesions.     Dentition: No gum lesions.     Tongue: No lesions.     Palate: No mass.     Pharynx: No pharyngeal swelling or posterior oropharyngeal erythema.     Tonsils: No tonsillar abscesses.  Cardiovascular:     Rate and Rhythm: Normal rate and regular rhythm.     Pulses: Normal pulses.     Heart sounds: Normal heart sounds.  Pulmonary:     Effort: Pulmonary effort is normal.     Breath sounds: Normal breath sounds.  Chest:     Comments: Port-a-Cath in R chest Abdominal:     Palpations: Abdomen is soft. There is no mass.     Tenderness: There is no abdominal tenderness.  Musculoskeletal:     Right lower leg: No edema.     Left lower leg: No edema.  Lymphadenopathy:     Cervical: No cervical adenopathy.  Neurological:     General: No focal deficit present.     Mental Status: He is alert and oriented to person, place, and time.  Psychiatric:        Mood and Affect: Mood normal.        Behavior: Behavior normal.      LABORATORY DATA:  I have reviewed the labs as listed.  CBC Latest Ref Rng & Units 01/22/2020 11/14/2019 10/27/2019  WBC 4.0 - 10.5 K/uL 4.0 3.5(L) 3.5(L)  Hemoglobin 13.0 - 17.0 g/dL 10.7(L) 10.2(L) 10.6(L)  Hematocrit 39 - 52 % 33.8(L) 32.2(L) 32.3(L)  Platelets 150 - 400 K/uL 191 185 187   CMP Latest Ref Rng & Units 01/22/2020 11/14/2019 10/27/2019  Glucose 70 - 99 mg/dL 118(H) 96 85  BUN 8 - 23 mg/dL 15 13 13   Creatinine 0.61 - 1.24 mg/dL 1.09 1.26(H) 0.96  Sodium 135 - 145 mmol/L 136 140 141  Potassium 3.5 - 5.1 mmol/L 3.8 4.6 4.8  Chloride 98 - 111 mmol/L 101 106 105  CO2 22 - 32 mmol/L 27 27 27   Calcium 8.9 - 10.3 mg/dL 9.1  9.6 9.4  Total Protein 6.5 - 8.1 g/dL 6.6 6.3(L) 6.2  Total Bilirubin 0.3 - 1.2 mg/dL 0.5 0.5 0.3  Alkaline Phos 38 - 126 U/L 42 44 -  AST 15 - 41 U/L 16 17 15   ALT 0 - 44 U/L 13 13 9     DIAGNOSTIC IMAGING:  I have independently reviewed the scans and discussed with the patient. CT SOFT TISSUE NECK W CONTRAST  Result Date: 01/22/2020 CLINICAL DATA:  Tonsillar cancer. Head/neck cancer, surveillance. Additional provided: Follow-up tonsil cancer, prior chemo radiation. EXAM: CT NECK WITH CONTRAST TECHNIQUE: Multidetector CT imaging of the neck was performed using the standard protocol following the bolus administration of intravenous contrast. CONTRAST:  59mL OMNIPAQUE IOHEXOL 300 MG/ML  SOLN COMPARISON:  PET-CT 08/14/2019. FINDINGS: Pharynx and larynx: The patient is edentulous. No appreciable residual/recurrent mass within the left glossotonsillar sulcus. Nonspecific asymmetric mucosal/submucosal edema of the left aryepiglottic fold, possibly with adjacent small volume secretions (series 2, image 54).  The patient is edentulous. Salivary glands: No inflammation, mass, or stone. Thyroid: Unremarkable. Lymph nodes: None enlarged or abnormal density. Vascular: The right internal jugular vein is patent within the lower neck and just beneath the level of the skull base. However, there is segmental non opacification of the mid to distal right IJ. The major vascular structures of the neck are otherwise patent. Limited intracranial: No acute intracranial abnormality identified. Visualized orbits: Incompletely imaged. No visible mass or acute finding. Mastoids and visualized paranasal sinuses: Trace ethmoid sinus mucosal thickening at the imaged levels. No significant mastoid effusion. Skeleton: No acute bony abnormality or aggressive osseous lesion. Advanced cervical spondylosis with multilevel disc space narrowing, disc bulges, posterior disc osteophytes and uncovertebral hypertrophy. Degenerative fusion across  the C6-C7 disc space. Upper chest: No apical lung mass or nodule. Paraseptal emphysema. Partially imaged right chest infusion port catheter. These results will be called to the ordering clinician or representative by the Radiologist Assistant, and communication documented in the PACS or [employer]. IMPRESSION: Asymmetric mucosal/submucosal edema of the left aryepiglottic fold. Findings are nonspecific and may be treatment related. While no discrete enhancing is identified at this site, this is a new finding as compared to the PET-CT of 08/14/2019. Consider short-interval CT follow-up or direct visualization for further assessment. No appreciable residual/recurrent left glossotonsillar sulcus mass. No pathologically enlarged cervical chain lymph nodes. Partial non-opacification of the mid to distal right internal jugular vein, likely reflecting partial thrombosis. Electronically Signed   By: Kellie Simmering DO   On: 01/22/2020 12:47     ASSESSMENT:  1. T2N1 squamous cell carcinoma the left tonsil, p16 positive: -PET scan on 03/27/2019 shows uptake in the left tonsillar fossa, 1.5 cm lymph node at the level 2 on the left, no other metastatic disease. -Chemoradiation therapy with cisplatin weekly from 05/08/2019 through 06/26/2019.  2. Prostate cancer: -Gleason 9 prostate cancer, diagnosed in 2013, status post XRT from April 2014 through May 2014 with 2 years of Lupron. PSA is normal.   PLAN:  1. Left tonsillar squamous cell carcinoma: -Physical exam did not reveal any palpable adenopathy or oropharyngeal masses. -Reviewed CT scan from 01/22/2020 which showed mucosal edema of the left aryepiglottic fold. -Recommend follow-up with Dr. Benjamine Foley.  Needs direct visualization. -RTC 6 months with repeat labs and scan.  Will check TSH.  2. Nutrition/weight loss: -Drinks Ensure 2 cans/day.  Eating three meals per day.  Weight is improving.  3. Renal insufficiency: -He developed cisplatin induced renal  insufficiency.  However creatinine improved to 1.09.  4.  Normocytic anemia: -Hemoglobin today is 10.7 and slightly improved.    Orders placed this encounter:  Orders Placed This Encounter  Procedures  . CT SOFT TISSUE NECK W CONTRAST  . TSH  . CBC with Differential/Platelet  . Comprehensive metabolic panel     Derek Jack, MD [place] [phone]   I, Milinda Antis, am acting as a scribe for Dr. Sanda Linger.  I, Derek Jack MD, have reviewed the above documentation for accuracy and completeness, and I agree with the above.

## 2020-01-25 NOTE — Patient Instructions (Signed)
[facility] at [facility] Discharge Instructions  You were seen today by Dr. Delton Coombes. He went over your recent results and scans. You will have a follow-up appointment with Dr. Benjamine Mola for your cancer. You will be scheduled for a CT scan of your neck before your next visit. Dr. Delton Coombes will see you back in 6 months for labs and follow up.   Thank you for choosing [facility] at [facility] to provide your oncology and hematology care.  To afford each patient quality time with our provider, please arrive at least 15 minutes before your scheduled appointment time.   If you have a lab appointment with the [facility] please come in thru the Main Entrance and check in at the main information desk  You need to re-schedule your appointment should you arrive 10 or more minutes late.  We strive to give you quality time with our providers, and arriving late affects you and other patients whose appointments are after yours.  Also, if you no show three or more times for appointments you may be dismissed from the clinic at the providers discretion.     Again, thank you for choosing [facility].  Our hope is that these requests will decrease the amount of time that you wait before being seen by our physicians.       _____________________________________________________________  Should you have questions after your visit to [facility], please contact our office at [phone] between the hours of 8:00 a.m. and 4:30 p.m.  Voicemails left after 4:00 p.m. will not be returned until the following business day.  For prescription refill requests, have your pharmacy contact our office and allow 72 hours.    Cancer Center Support Programs:   > Cancer Support Group  2nd Tuesday of the month 1pm-2pm, Journey Room

## 2020-01-29 ENCOUNTER — Other Ambulatory Visit: Payer: Self-pay | Admitting: *Deleted

## 2020-01-29 MED ORDER — HYDROCODONE-ACETAMINOPHEN 10-325 MG PO TABS
1.0000 | ORAL_TABLET | ORAL | 0 refills | Status: DC | PRN
Start: 2020-01-29 — End: 2020-02-28

## 2020-01-29 NOTE — Telephone Encounter (Signed)
Received call from patient.   Requested refill on Hydrocodone/APAP.   Ok to refill??  Last office visit 10/27/2019.  Last refill 12/27/2019.

## 2020-02-02 NOTE — Chronic Care Management (AMB) (Signed)
Chronic Care Management Pharmacy  Name: Cory Foley  MRN: [id number] DOB: [birth year]  Chief Complaint/ HPI  Cory Foley,  69 y.o. , male presents for their Follow-Up CCM visit with the clinical pharmacist via telephone.  PCP : Alycia Rossetti, MD  Their chronic conditions include: GERD, osteoarthritis, hyperlipidemia, RLS, depression.  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 aspirin EC 81 MG tablet Take 81 mg by mouth daily.  . calcium carbonate (OS-CAL) 600 MG tablet Take 1 tablet (600 mg total) by mouth 2 (two) times daily with a meal. (Patient taking differently: Take 600 mg by mouth 2 (two) times daily.)  . Cholecalciferol (VITAMIN D) 50 MCG (2000 UT) tablet Take 2,000 Units by mouth in the morning and at bedtime.  . clopidogrel (PLAVIX) 75 MG tablet TAKE 1 TABLET BY MOUTH ONCE A DAY.  . cyanocobalamin 1000 MCG tablet Take 1,000 mcg by mouth 2 (two) times daily.  . famotidine (PEPCID) 20 MG tablet TAKE 1 TABLET BY MOUTH AT BEDTIME.  Marland Kitchen HYDROcodone-acetaminophen (NORCO) 10-325 MG tablet Take 1 tablet by mouth every 4 (four) hours as needed for moderate pain or severe pain.  . magnesium oxide (MAG-OX) 400 (241.3 Mg) MG tablet Take 1 tablet  (400 mg total) by mouth 2 (two) times daily.  . methocarbamol (ROBAXIN) 500 MG tablet Take 500 mg by mouth every 6 (six) hours as needed for muscle spasms.   . mirtazapine (REMERON) 30 MG tablet Take 1 tablet (30 mg total) by mouth at bedtime.  . pantoprazole (PROTONIX) 40 MG tablet TAKE ONE TABLET BY MOUTH DAILY.  Marland Kitchen rOPINIRole (REQUIP) 3 MG tablet TAKE (1) TABLET BY MOUTH AT BEDTIME.  . simvastatin (ZOCOR) 40 MG tablet TAKE (1) TABLET BY MOUTH AT BEDTIME.  . [DISCONTINUED] dronabinol (MARINOL) 5 MG capsule Take 1 capsule (5 mg total) by mouth 2 (two) times daily before a meal.   No facility-administered encounter medications on file as of 02/13/2020.     Current Diagnosis/Assessment:  Emergency planning/management officer Strain: Low Risk   . Difficulty of Paying Living Expenses: Not hard at all     Goals Addressed            This Visit's Progress   . Pharmacy Care Plan:       CARE PLAN ENTRY  Current Barriers:  . Chronic Disease Management support, education, and care coordination needs related to Hyperlipidemia, Osteoarthritis, and restless leg syndrome   Hyperlipidemia . Pharmacist Clinical Goal(s): o Over the next 120 days, patient will work with PharmD and providers to maintain LDL goal < 100 . Current regimen:  o Simvastatin 40mg  daily . Interventions: o Congratulated on success getting LDL to goal o Discussed incidence of leg pain . Patient self care activities - Over the next 120 days, patient will:  o Focus on medication adherence by pill box o Continue to take medication as prescribed   Restless Leg Syndrome . Pharmacist Clinical Goal(s) o Over the next 120 days, patient will work with PharmD and providers to optimize medication and minimize symptoms related to RLS. . Current regimen:  o Ropinirole 3mg  at bedtime . Interventions: o Comprehensive medication review o Continue current therapy o Move mirtazapine 30mg  closer to bed time to see if this helps sleeping . Patient  self care activities - Over the next 120 days, patient will: o Continue to focus on medication adherence by pill box o Contact PharmD or PCP with any medication related concerns or increase in symptoms  Osteoarthritis . Pharmacist Clinical Goal(s) o Over the next 120 days, patient will work with PharmD and providers to optimize medication and minimize symptoms related to osteoarthritis. . Current regimen:  o Hydrocodon/APA 10-325mg  every 4 hours as needed . Interventions: o Comprehensive medication review o Continue current therapy o Evaluated current pain levels . Patient self care activities - Over the next 120 days, patient will: o Continue to focus on medication adherence by pill box o Contact PharmD or PCP with any medication related concerns or increase in symptoms  Please see past updates related to this goal by clicking on the "Past Updates" button in the selected goal        Osteoarthritis    Patient has failed these meds in past: none noted Patient is currently controlled on the following medications: Hydrocodone-APAP 10-325 mg q4h prn  Currently pain is controlled.  No concerns per patient.  Plan  Continue current medications  Hyperlipidemia   Lipid Panel     Component Value Date/Time   CHOL 140 10/27/2019 1011   TRIG 113 10/27/2019 1011   HDL 47 10/27/2019 1011   LDLCALC 73 10/27/2019 1011     The 10-year ASCVD risk score Mikey Bussing DC Jr., et al., 2013) is: 10.6%   Values used to calculate the score:     Age: 21 years     Sex: Male     Is Non-Hispanic African American: No     Diabetic: No     Tobacco smoker: Yes     Systolic Blood Pressure: 93 mmHg     Is BP treated: No     HDL Cholesterol: 47 mg/dL     Total Cholesterol: 140 mg/dL   Patient has failed these meds in past: none noted Patient is currently controlled based on most recent LDL on the following medications:  . Simvastatin 40mg  daily  We discussed:  Lipids well controlled  Patient is  adherent to medication, he does report some leg aching at night time  Recommended he switch he mirtazapine closer to the evening to see if this helps him get some sleep  If this does not help would recommend trial off of simvastatin 40mg  to see if this helps his leg soreness.  Has follow up with Dr. Buelah Manis in early January and will recommend assessing this at that visit.   Plan  Continue current medications.  Recheck lipids at next follow up visit  RLS    Patient has failed these meds in past: ropinorole 0.5mg -2mg  Patient is currently controlled on the following medications: ropinirol 3mg  hs  We discussed:  Reports leg soreness at night time.  He does not notice it during the day because he is active.  Reports that he typically sleeps from about 7 at night until 11pm and then is up again until  about 5 am because his legs are sore.  Recommended he move his mirtazapine closer to the evening/bedtime so maybe it will help him sleep throughout the night.  If this doesn't help would recommend trial off of statin medication.  Plan  Continue current medications.  Depression    Patient has failed these meds in past: none noted Patient is currently controlled on the following medications: no medications currently  Reports stable mood lately with no concerns.  Plan  Continue current medications GERD    Patient has failed these meds in past: none noted Patient is currently controlled on the following medications: famitidine 20mg , pantoprazole 40mg   Controlled, no acid reflux symptoms.  Plan  Continue current medications    Vaccines   Reviewed and discussed patient's vaccination history.    Immunization History  Administered Date(s) Administered  . Fluad Quad(high Dose 65+) 12/26/2018, 10/27/2019  . Influenza, High Dose Seasonal PF 01/25/2018  . Influenza,inj,Quad PF,6+ Mos 11/11/2015, 11/16/2016  . Pneumococcal Conjugate-13 07/14/2016  . Pneumococcal  Polysaccharide-23 06/07/2013  . Td 02/23/2005  . Zoster Recombinat (Shingrix) 07/14/2016    Plan  Recommended patient receive shingles vaccine in office/pharmacy.   Medication Management   . Miscellaneous medications: methocarbamol 500mg , cyclobenzaprine 5mg , clopidogrel 75mg  . OTC's: magnesium oxide 400mg , vitamin d 50mcg, ASA 81mg , Os-cal 600mg  . Patient currently uses [facility].  Phone #  [phone] . Patient reports using pill box method to organize medications and promote adherence. . Patient reports/denies missed doses of medication.   Beverly Milch, PharmD Clinical Pharmacist [place] [phone]

## 2020-02-13 ENCOUNTER — Ambulatory Visit: Payer: Medicare Other | Admitting: Pharmacist

## 2020-02-13 NOTE — Patient Instructions (Addendum)
Visit Information  Goals Addressed            This Visit's Progress   . Pharmacy Care Plan:       CARE PLAN ENTRY  Current Barriers:  . Chronic Disease Management support, education, and care coordination needs related to Hyperlipidemia, Osteoarthritis, and restless leg syndrome   Hyperlipidemia . Pharmacist Clinical Goal(s): o Over the next 120 days, patient will work with PharmD and providers to maintain LDL goal < 100 . Current regimen:  o Simvastatin 40mg  daily . Interventions: o Congratulated on success getting LDL to goal o Discussed incidence of leg pain . Patient self care activities - Over the next 120 days, patient will: o Focus on medication adherence by pill box o Continue to take medication as prescribed   Restless Leg Syndrome . Pharmacist Clinical Goal(s) o Over the next 120 days, patient will work with PharmD and providers to optimize medication and minimize symptoms related to RLS. . Current regimen:  o Ropinirole 3mg  at bedtime . Interventions: o Comprehensive medication review o Continue current therapy o Move mirtazapine 30mg  closer to bed time to see if this helps sleeping . Patient self care activities - Over the next 120 days, patient will: o Continue to focus on medication adherence by pill box o Contact PharmD or PCP with any medication related concerns or increase in symptoms  Osteoarthritis . Pharmacist Clinical Goal(s) o Over the next 120 days, patient will work with PharmD and providers to optimize medication and minimize symptoms related to osteoarthritis. . Current regimen:  o Hydrocodon/APA 10-325mg  every 4 hours as needed . Interventions: o Comprehensive medication review o Continue current therapy o Evaluated current pain levels . Patient self care activities - Over the next 120 days, patient will: o Continue to focus on medication adherence by pill box o Contact PharmD or PCP with any medication related concerns or increase in  symptoms  Please see past updates related to this goal by clicking on the "Past Updates" button in the selected goal         The patient verbalized understanding of instructions, educational materials, and care plan provided today and agreed to receive a mailed copy of patient instructions, educational materials, and care plan.   Telephone follow up appointment with pharmacy team member scheduled for: 4 months  Cory Foley, PharmD Clinical Pharmacist [facility] [phone]   Chronic Pain, Adult Chronic pain is a type of pain that lasts or keeps coming back (recurs) for at least six months. You may have chronic headaches, abdominal pain, or body pain. Chronic pain may be related to an illness, such as fibromyalgia or complex regional pain syndrome. Sometimes the cause of chronic pain is not known. Chronic pain can make it hard for you to do daily activities. If not treated, chronic pain can lead to other health problems, including anxiety and depression. Treatment depends on the cause and severity of your pain. You may need to work with a pain specialist to come up with a treatment plan. The plan may include medicine, counseling, and physical therapy. Many people benefit from a combination of two or more types of treatment to control their pain. Follow these instructions at home: Lifestyle  Consider keeping a pain diary to share with your health care providers.  Consider talking with a mental health care provider (psychologist) about how to cope with chronic pain.  Consider joining a chronic pain support group.  Try to control or lower your stress  levels. Talk to your health care provider about strategies to do this. General instructions   Take over-the-counter and prescription medicines only as told by your health care provider.  Follow your treatment plan as told by your health care provider. This may include: ? Gentle, regular exercise. ? Eating a  healthy diet that includes foods such as vegetables, fruits, fish, and lean meats. ? Cognitive or behavioral therapy. ? Working with a Community education officer. ? Meditation or yoga. ? Acupuncture or massage therapy. ? Aroma, color, light, or sound therapy. ? Local electrical stimulation. ? Shots (injections) of numbing or pain-relieving medicines into the spine or the area of pain.  Check your pain level as told by your health care provider. Ask your health care provider if you should use a pain scale.  Learn as much as you can about how to manage your chronic pain. Ask your health care provider if an intensive pain rehabilitation program or a chronic pain specialist would be helpful.  Keep all follow-up visits as told by your health care provider. This is important. Contact a health care provider if:  Your pain gets worse.  You have new pain.  You have trouble sleeping.  You have trouble doing your normal activities.  Your pain is not controlled with treatment.  Your have side effects from pain medicine.  You feel weak. Get help right away if:  You lose feeling or have numbness in your body.  You lose control of bowel or bladder function.  Your pain suddenly gets much worse.  You develop shaking or chills.  You develop confusion.  You develop chest pain.  You have trouble breathing or shortness of breath.  You pass out.  You have thoughts about hurting yourself or others. This information is not intended to replace advice given to you by your health care provider. Make sure you discuss any questions you have with your health care provider. Document Revised: 01/22/2017 Document Reviewed: 07/30/2015 Elsevier Patient Education  [place].

## 2020-02-26 ENCOUNTER — Ambulatory Visit: Payer: Medicare Other | Admitting: Family Medicine

## 2020-02-27 ENCOUNTER — Ambulatory Visit: Payer: Medicare Other | Admitting: General Surgery

## 2020-02-27 ENCOUNTER — Other Ambulatory Visit: Payer: Self-pay

## 2020-02-27 ENCOUNTER — Encounter: Payer: Self-pay | Admitting: General Surgery

## 2020-02-27 ENCOUNTER — Ambulatory Visit: Payer: Medicare Other | Admitting: Family Medicine

## 2020-02-27 VITALS — BP 104/72 | HR 58 | Temp 97.9°F | Resp 12 | Ht 68.0 in | Wt 135.0 lb

## 2020-02-27 DIAGNOSIS — C099 Malignant neoplasm of tonsil, unspecified: Secondary | ICD-10-CM | POA: Diagnosis not present

## 2020-02-27 DIAGNOSIS — Z95828 Presence of other vascular implants and grafts: Secondary | ICD-10-CM | POA: Diagnosis not present

## 2020-02-27 NOTE — Patient Instructions (Signed)
Do not take your plavix on 03/01/2020, Friday and the following days until told to resume after surgery.   Implanted Port Removal  Implanted port removal is a procedure to remove the port and catheter that are implanted under your skin. The port is a small disc under your skin that can be punctured with a needle. It is connected to a vein in your chest or neck by a small flexible tube (catheter). The implanted port is used to give medicines for treatments, and it may also be used to take blood samples. Your health care provider will remove the implanted port if:  You no longer need it for treatment.  It is not working properly.  The area around it gets infected. Tell a health care provider about:  Any allergies you have.  All medicines you are taking, including vitamins, herbs, eye drops, creams, and over-the-counter medicines.  Any problems you or family members have had with anesthetic medicines.  Any blood disorders you have.  Any surgeries you have had.  Any medical conditions you have.  Whether you are pregnant or may be pregnant. What are the risks? Generally, this is a safe procedure. However, problems may occur, including:  Infection.  Bleeding.  Allergic reactions to anesthetic medicines.  Damage to nerves or blood vessels. What happens before the procedure? Medicines  Ask your health care provider about: ? Changing or stopping your regular medicines. This is especially important if you are taking diabetes medicines or blood thinners. ? Taking medicines such as aspirin and ibuprofen. These medicines can thin your blood. Do not take these medicines unless your health care provider tells you to take them. ? Taking over-the-counter medicines, vitamins, herbs, and supplements. General instructions  You will have: ? A physical exam. ? Blood tests. ? Imaging tests, including a chest X-ray.  Follow instructions from your health care provider about eating or drinking  restrictions.  Ask your health care provider how your surgical site will be marked or identified.  Ask your health care provider what steps will be taken to help prevent infection. These may include: ? Removing hair at the surgery site. ? Washing skin with a germ-killing soap. ? Antibiotic medicine.  Plan to have someone take you home from the hospital or clinic.  If you will be going home right after the procedure, plan to have a responsible adult care for you for at least 24 hours after you leave the hospital or clinic. This is important. What happens during the procedure?  You may be given one or more of the following: ? A medicine to help you relax (sedative). ? A medicine to numb the area (local anesthetic).  A small incision will be made at the site of your implanted port.  The implanted port and the catheter that has been inside your vein will be gently removed.  The port and catheter will be inspected to make sure that all the parts have been removed. Part of the catheter may be tested for bacteria.  The incision will be closed with stitches (sutures), adhesive strips, or skin glue.  A bandage (dressing) will be placed over the incision. The health care provider may apply gentle pressure over the dressing for about 5 minutes. The procedure may vary among health care providers and hospitals. What happens after the procedure?  Your blood pressure, heart rate, breathing rate, and blood oxygen level will be monitored until you leave the hospital or clinic.  You will be monitored to make sure  that there is no bleeding from the site where the port was removed.  Do not drive for 24 hours if you were given a sedative during your procedure. Summary  Implanted port removal is a procedure to remove the port and catheter that are implanted under your skin.  Before the procedure, follow your health care provider's instructions about changing or stopping your regular medicines. This  is especially important if you are taking diabetes medicines or blood thinners.  If you will be going home right after the procedure, plan to have a responsible adult care for you for at least 24 hours after you leave the hospital or clinic. This information is not intended to replace advice given to you by your health care provider. Make sure you discuss any questions you have with your health care provider. Document Revised: 03/25/2017 Document Reviewed: 03/25/2017 Elsevier Patient Education  2020 [employer].

## 2020-02-27 NOTE — Progress Notes (Signed)
[facility] History and Physical  Reason for Referral: Port in place  Referring Physician:  Dr. Delton Coombes   Chief Complaint    Follow-up      Cory Foley is a 70 y.o. male.  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  He was referred to get his port removed. He says that he does not want it any more. His left tonsil has not been directly visualized since treatement but he does not want any more chemotherapy per his report.   He is here to discuss removal. He takes Plavix.   Past Medical History:  Diagnosis Date  . Allergy   . Anxiety   . Arthritis   . BPH with obstruction/lower urinary tract symptoms   . COPD, mild ([place])    current smoker  . ED (erectile dysfunction)   . GERD (gastroesophageal reflux disease)   . Hyperlipidemia   . Nocturia   . Osteoporosis   . PAD (peripheral artery disease) ([place])   . Port-A-Cath in place 04/28/2019   Right  . Prostate cancer ([place]) 01/11/2012   Adenocarcinoma, Gleason=4+4=8, & 4+5=9,PSA=17.24, Volume= 15.45cc  . PVD (peripheral vascular disease) ([place])   . Restless leg syndrome   . Tongue mass    SCC    Past Surgical History:  Procedure Laterality Date  . ABDOMINAL AORTOGRAM W/LOWER EXTREMITY N/A 08/16/2018   Procedure: ABDOMINAL AORTOGRAM W/LOWER EXTREMITY;  Surgeon: Serafina Mitchell, MD;  Location: [place] [facility];  Service: Cardiovascular;  Laterality: N/A;  Bilateral  . COLONOSCOPY N/A 09/28/2012   Procedure: COLONOSCOPY;  Surgeon: Danie Binder, MD;  [facility];  Service: Endoscopy;  Laterality: N/A;  8:30  . DIRECT LARYNGOSCOPY N/A 04/03/2019   Procedure: DIRECT LARYNGOSCOPY;  Surgeon: Leta Baptist, MD;  Location: [place];  Service: ENT;  Laterality: N/A;  . ESOPHAGOGASTRODUODENOSCOPY N/A 09/28/2012   Procedure: ESOPHAGOGASTRODUODENOSCOPY (EGD);  Surgeon: Danie Binder, MD;  [facility];  Service: Endoscopy;  Laterality: N/A;   . Gold seed implatation  04/28/2012  . HERNIA REPAIR     Bilateral inguinal X2  . IR IMAGING GUIDED PORT INSERTION  05/03/2019   Right  . JOINT REPLACEMENT     bilateral  . KNEE SURGERY     Left Knee X 2   and Right knee X1  . MALONEY DILATION N/A 09/28/2012   Procedure: MALONEY DILATION;  Surgeon: Danie Binder, MD;  [facility];  Service: Endoscopy;  Laterality: N/A;  . NOSE SURGERY    . PERIPHERAL VASCULAR INTERVENTION  08/16/2018   Procedure: PERIPHERAL VASCULAR INTERVENTION;  Surgeon: Serafina Mitchell, MD;  Location: [place] [facility];  Service: Cardiovascular;;  LT Iliac  . PR VEIN BYPASS GRAFT,AORTO-FEM-POP  10/21/10   Left AK to BK popliteal BPG  . PROSTATE BIOPSY  01/11/2012   Adenocarcinoma  . PROSTATE SURGERY  2015   Chemo and  Radiation  . SAVORY DILATION N/A 09/28/2012   Procedure: SAVORY DILATION;  Surgeon: Danie Binder, MD;  [facility];  Service: Endoscopy;  Laterality: N/A;  . throat biopsy  03/20/2019  . TONSILLECTOMY Left 04/03/2019   Procedure: TONSILLECTOMY;  Surgeon: Leta Baptist, MD;  Location: [place];  Service: ENT;  Laterality: Left;    Family History  Problem Relation Age of Onset  . Heart disease Mother        Valve regurgitation and  Pacemaker   . Diabetes Mother   . Hyperlipidemia Mother   . Heart disease Father        CABG x 5  . Hyperlipidemia Father   . Hypertension Father   . Heart attack Father   . Heart disease Sister        aortic valve replacement  . Cancer Sister 60       Colon cancer w/ metastasis  . Throat cancer Sister   . Hypertension Brother   . Heart disease Maternal Grandmother   . Heart disease Maternal Grandfather   . Heart disease Paternal Grandmother   . Cancer Paternal Grandfather   . Healthy Son   . Healthy Daughter     Social History   Tobacco Use  . Smoking status: Current Every Day Smoker    Packs/day: 0.50    Years: 43.00    Pack years: 21.50    Types: Cigarettes  .  Smokeless tobacco: Never Used  . Tobacco comment: 1/2 ppd  Vaping Use  . Vaping Use: Never used  Substance Use Topics  . Alcohol use: No    Comment: quit 1.5 years ago  . Drug use: No    Medications: I have reviewed the patient's current medications. Allergies as of 02/27/2020      Reactions   Codeine Other (See Comments)   Bad headache and sweats   Morphine And Related Itching   sweats   Percocet [oxycodone-acetaminophen] Other (See Comments)   Headache and sweats      Medication List       Accurate as of February 27, 2020 11:24 AM. If you have any questions, ask your nurse or doctor.        aspirin EC 81 MG tablet Take 81 mg by mouth daily.   calcium carbonate 600 MG tablet Commonly known as: OS-CAL Take 1 tablet (600 mg total) by mouth 2 (two) times daily with a meal. What changed: when to take this   clopidogrel 75 MG tablet Commonly known as: PLAVIX TAKE 1 TABLET BY MOUTH ONCE A DAY.   cyanocobalamin 1000 MCG tablet Take 1,000 mcg by mouth 2 (two) times daily.   famotidine 20 MG tablet Commonly known as: PEPCID TAKE 1 TABLET BY MOUTH AT BEDTIME.   HYDROcodone-acetaminophen 10-325 MG tablet Commonly known as: NORCO Take 1 tablet by mouth every 4 (four) hours as needed for moderate pain or severe pain.   magnesium oxide 400 (241.3 Mg) MG tablet Commonly known as: MAG-OX Take 1 tablet (400 mg total) by mouth 2 (two) times daily.   methocarbamol 500 MG tablet Commonly known as: ROBAXIN Take 500 mg by mouth every 6 (six) hours as needed for muscle spasms.   mirtazapine 30 MG tablet Commonly known as: REMERON Take 1 tablet (30 mg total) by mouth at bedtime.   pantoprazole 40 MG tablet Commonly known as: PROTONIX TAKE ONE TABLET BY MOUTH DAILY.   rOPINIRole 3 MG tablet Commonly known as: REQUIP TAKE (1) TABLET BY MOUTH AT BEDTIME.   simvastatin 40 MG tablet Commonly known as: ZOCOR TAKE (1) TABLET BY MOUTH AT BEDTIME.   Vitamin D 50 MCG (2000 UT)  tablet Take 2,000 Units by mouth in the morning and at bedtime.        ROS:  A comprehensive review of systems was negative except for: Respiratory: positive for SOB, COPD Genitourinary: positive for frequency Musculoskeletal: positive for back pain, neck pain and joint pain  Blood pressure 104/72, pulse (!) 58, temperature  97.9 F (36.6 C), temperature source Oral, resp. rate 12, height 5\' 8"  (1.727 m), weight 135 lb (61.2 kg), SpO2 93 %. Physical Exam Vitals reviewed.  Constitutional:      Appearance: Normal appearance.  HENT:     Head: Normocephalic.     Nose: Nose normal.     Mouth/Throat:     Mouth: Mucous membranes are moist.  Eyes:     Extraocular Movements: Extraocular movements intact.  Cardiovascular:     Rate and Rhythm: Normal rate and regular rhythm.  Pulmonary:     Effort: Pulmonary effort is normal.     Breath sounds: Normal breath sounds.  Abdominal:     General: There is no distension.     Palpations: Abdomen is soft.     Tenderness: There is no abdominal tenderness.  Musculoskeletal:        General: Normal range of motion.     Cervical back: Normal range of motion.  Skin:    General: Skin is warm.  Neurological:     General: No focal deficit present.     Mental Status: He is alert and oriented to person, place, and time.  Psychiatric:        Mood and Affect: Mood normal.        Behavior: Behavior normal.        Thought Content: Thought content normal.        Judgment: Judgment normal.     Results: None   Assessment & Plan:  Cory Foley is a 70 y.o. male with a right internal jugular port placed 04/2019 with IR. He no longer wants the port. We discussed removal with local and the risk of bleeding, infection, pneumothorax, and incomplete removal. Discussed CXR after removal. We will do this in the procedure room with local.   He will hold his plavix for 5 days prior.   All questions were answered to the satisfaction of the  patient.   05/2019 02/27/2020, 11:24 AM

## 2020-02-28 ENCOUNTER — Other Ambulatory Visit: Payer: Self-pay | Admitting: Family Medicine

## 2020-02-28 NOTE — Telephone Encounter (Signed)
Ok to refill??  Last office visit 10/27/2019.  Last refill 01/29/2020.

## 2020-03-01 NOTE — H&P (Signed)
[facility] History and Physical  Reason for Referral: Port in place  Referring Physician:  Dr. Delton Coombes   Chief Complaint    Follow-up      Cory Foley is a 70 y.o. male.  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  He was referred to get his port removed. He says that he does not want it any more. His left tonsil has not been directly visualized since treatement but he does not want any more chemotherapy per his report.   He is here to discuss removal. He takes Plavix.   Past Medical History:  Diagnosis Date  . Allergy   . Anxiety   . Arthritis   . BPH with obstruction/lower urinary tract symptoms   . COPD, mild ([place])    current smoker  . ED (erectile dysfunction)   . GERD (gastroesophageal reflux disease)   . Hyperlipidemia   . Nocturia   . Osteoporosis   . PAD (peripheral artery disease) ([place])   . Port-A-Cath in place 04/28/2019   Right  . Prostate cancer ([place]) 01/11/2012   Adenocarcinoma, Gleason=4+4=8, & 4+5=9,PSA=17.24, Volume= 15.45cc  . PVD (peripheral vascular disease) ([place])   . Restless leg syndrome   . Tongue mass    SCC    Past Surgical History:  Procedure Laterality Date  . ABDOMINAL AORTOGRAM W/LOWER EXTREMITY N/A 08/16/2018   Procedure: ABDOMINAL AORTOGRAM W/LOWER EXTREMITY;  Surgeon: Serafina Mitchell, MD;  Location: [place] [facility];  Service: Cardiovascular;  Laterality: N/A;  Bilateral  . COLONOSCOPY N/A 09/28/2012   Procedure: COLONOSCOPY;  Surgeon: Danie Binder, MD;  [facility];  Service: Endoscopy;  Laterality: N/A;  8:30  . DIRECT LARYNGOSCOPY N/A 04/03/2019   Procedure: DIRECT LARYNGOSCOPY;  Surgeon: Leta Baptist, MD;  Location: [place];  Service: ENT;  Laterality: N/A;  . ESOPHAGOGASTRODUODENOSCOPY N/A 09/28/2012   Procedure: ESOPHAGOGASTRODUODENOSCOPY (EGD);  Surgeon: Danie Binder, MD;  [facility];  Service: Endoscopy;  Laterality: N/A;   . Gold seed implatation  04/28/2012  . HERNIA REPAIR     Bilateral inguinal X2  . IR IMAGING GUIDED PORT INSERTION  05/03/2019   Right  . JOINT REPLACEMENT     bilateral  . KNEE SURGERY     Left Knee X 2   and Right knee X1  . MALONEY DILATION N/A 09/28/2012   Procedure: MALONEY DILATION;  Surgeon: Danie Binder, MD;  [facility];  Service: Endoscopy;  Laterality: N/A;  . NOSE SURGERY    . PERIPHERAL VASCULAR INTERVENTION  08/16/2018   Procedure: PERIPHERAL VASCULAR INTERVENTION;  Surgeon: Serafina Mitchell, MD;  Location: [place] [facility];  Service: Cardiovascular;;  LT Iliac  . PR VEIN BYPASS GRAFT,AORTO-FEM-POP  10/21/10   Left AK to BK popliteal BPG  . PROSTATE BIOPSY  01/11/2012   Adenocarcinoma  . PROSTATE SURGERY  2015   Chemo and  Radiation  . SAVORY DILATION N/A 09/28/2012   Procedure: SAVORY DILATION;  Surgeon: Danie Binder, MD;  [facility];  Service: Endoscopy;  Laterality: N/A;  . throat biopsy  03/20/2019  . TONSILLECTOMY Left 04/03/2019   Procedure: TONSILLECTOMY;  Surgeon: Leta Baptist, MD;  Location: [place];  Service: ENT;  Laterality: Left;    Family History  Problem Relation Age of Onset  . Heart disease Mother        Valve regurgitation and  Pacemaker   . Diabetes Mother   . Hyperlipidemia Mother   . Heart disease Father        CABG x 5  . Hyperlipidemia Father   . Hypertension Father   . Heart attack Father   . Heart disease Sister        aortic valve replacement  . Cancer Sister 60       Colon cancer w/ metastasis  . Throat cancer Sister   . Hypertension Brother   . Heart disease Maternal Grandmother   . Heart disease Maternal Grandfather   . Heart disease Paternal Grandmother   . Cancer Paternal Grandfather   . Healthy Son   . Healthy Daughter     Social History   Tobacco Use  . Smoking status: Current Every Day Smoker    Packs/day: 0.50    Years: 43.00    Pack years: 21.50    Types: Cigarettes  .  Smokeless tobacco: Never Used  . Tobacco comment: 1/2 ppd  Vaping Use  . Vaping Use: Never used  Substance Use Topics  . Alcohol use: No    Comment: quit 1.5 years ago  . Drug use: No    Medications: I have reviewed the patient's current medications. Allergies as of 02/27/2020      Reactions   Codeine Other (See Comments)   Bad headache and sweats   Morphine And Related Itching   sweats   Percocet [oxycodone-acetaminophen] Other (See Comments)   Headache and sweats      Medication List       Accurate as of February 27, 2020 11:24 AM. If you have any questions, ask your nurse or doctor.        aspirin EC 81 MG tablet Take 81 mg by mouth daily.   calcium carbonate 600 MG tablet Commonly known as: OS-CAL Take 1 tablet (600 mg total) by mouth 2 (two) times daily with a meal. What changed: when to take this   clopidogrel 75 MG tablet Commonly known as: PLAVIX TAKE 1 TABLET BY MOUTH ONCE A DAY.   cyanocobalamin 1000 MCG tablet Take 1,000 mcg by mouth 2 (two) times daily.   famotidine 20 MG tablet Commonly known as: PEPCID TAKE 1 TABLET BY MOUTH AT BEDTIME.   HYDROcodone-acetaminophen 10-325 MG tablet Commonly known as: NORCO Take 1 tablet by mouth every 4 (four) hours as needed for moderate pain or severe pain.   magnesium oxide 400 (241.3 Mg) MG tablet Commonly known as: MAG-OX Take 1 tablet (400 mg total) by mouth 2 (two) times daily.   methocarbamol 500 MG tablet Commonly known as: ROBAXIN Take 500 mg by mouth every 6 (six) hours as needed for muscle spasms.   mirtazapine 30 MG tablet Commonly known as: REMERON Take 1 tablet (30 mg total) by mouth at bedtime.   pantoprazole 40 MG tablet Commonly known as: PROTONIX TAKE ONE TABLET BY MOUTH DAILY.   rOPINIRole 3 MG tablet Commonly known as: REQUIP TAKE (1) TABLET BY MOUTH AT BEDTIME.   simvastatin 40 MG tablet Commonly known as: ZOCOR TAKE (1) TABLET BY MOUTH AT BEDTIME.   Vitamin D 50 MCG (2000 UT)  tablet Take 2,000 Units by mouth in the morning and at bedtime.        ROS:  A comprehensive review of systems was negative except for: Respiratory: positive for SOB, COPD Genitourinary: positive for frequency Musculoskeletal: positive for back pain, neck pain and joint pain  Blood pressure 104/72, pulse (!) 58, temperature  97.9 F (36.6 C), temperature source Oral, resp. rate 12, height 5' 8" (1.727 m), weight 135 lb (61.2 kg), SpO2 93 %. Physical Exam Vitals reviewed.  Constitutional:      Appearance: Normal appearance.  HENT:     Head: Normocephalic.     Nose: Nose normal.     Mouth/Throat:     Mouth: Mucous membranes are moist.  Eyes:     Extraocular Movements: Extraocular movements intact.  Cardiovascular:     Rate and Rhythm: Normal rate and regular rhythm.  Pulmonary:     Effort: Pulmonary effort is normal.     Breath sounds: Normal breath sounds.  Abdominal:     General: There is no distension.     Palpations: Abdomen is soft.     Tenderness: There is no abdominal tenderness.  Musculoskeletal:        General: Normal range of motion.     Cervical back: Normal range of motion.  Skin:    General: Skin is warm.  Neurological:     General: No focal deficit present.     Mental Status: He is alert and oriented to person, place, and time.  Psychiatric:        Mood and Affect: Mood normal.        Behavior: Behavior normal.        Thought Content: Thought content normal.        Judgment: Judgment normal.     Results: None   Assessment & Plan:  Cory Foley is a 69 y.o. male with a right internal jugular port placed 04/2019 with IR. He no longer wants the port. We discussed removal with local and the risk of bleeding, infection, pneumothorax, and incomplete removal. Discussed CXR after removal. We will do this in the procedure room with local.   He will hold his plavix for 5 days prior.   All questions were answered to the satisfaction of the  patient.   Cory Foley 02/27/2020, 11:24 AM       

## 2020-03-04 ENCOUNTER — Telehealth (INDEPENDENT_AMBULATORY_CARE_PROVIDER_SITE_OTHER): Payer: Medicare Other | Admitting: Family Medicine

## 2020-03-04 ENCOUNTER — Encounter: Payer: Self-pay | Admitting: Family Medicine

## 2020-03-04 ENCOUNTER — Other Ambulatory Visit: Payer: Self-pay

## 2020-03-04 DIAGNOSIS — E440 Moderate protein-calorie malnutrition: Secondary | ICD-10-CM

## 2020-03-04 DIAGNOSIS — G2581 Restless legs syndrome: Secondary | ICD-10-CM | POA: Diagnosis not present

## 2020-03-04 DIAGNOSIS — I739 Peripheral vascular disease, unspecified: Secondary | ICD-10-CM | POA: Diagnosis not present

## 2020-03-04 DIAGNOSIS — M5136 Other intervertebral disc degeneration, lumbar region: Secondary | ICD-10-CM | POA: Diagnosis not present

## 2020-03-04 DIAGNOSIS — F321 Major depressive disorder, single episode, moderate: Secondary | ICD-10-CM | POA: Diagnosis not present

## 2020-03-04 MED ORDER — FAMOTIDINE 20 MG PO TABS
20.0000 mg | ORAL_TABLET | Freq: Every day | ORAL | 1 refills | Status: DC
Start: 2020-03-04 — End: 2020-10-02

## 2020-03-04 MED ORDER — IRON 325 (65 FE) MG PO TABS: ORAL_TABLET | ORAL | 0 refills | Status: AC

## 2020-03-04 MED ORDER — SIMVASTATIN 40 MG PO TABS
40.0000 mg | ORAL_TABLET | Freq: Every day | ORAL | 1 refills | Status: DC
Start: 2020-03-04 — End: 2020-08-12

## 2020-03-04 MED ORDER — ROPINIROLE HCL 3 MG PO TABS
ORAL_TABLET | ORAL | 2 refills | Status: DC
Start: 2020-03-04 — End: 2020-08-01

## 2020-03-04 MED ORDER — PANTOPRAZOLE SODIUM 40 MG PO TBEC
40.0000 mg | DELAYED_RELEASE_TABLET | Freq: Every day | ORAL | 1 refills | Status: DC
Start: 2020-03-04 — End: 2020-10-02

## 2020-03-04 MED ORDER — GABAPENTIN 100 MG PO CAPS: ORAL_CAPSULE | ORAL | 3 refills | Status: DC

## 2020-03-04 NOTE — Progress Notes (Signed)
Virtual Visit via Video Note  I connected with Cory Foley on 03/04/20 at 8:12am by a video enabled telemedicine application and verified that I am speaking with the correct person using two identifiers.  Location: Patient: at Home  Provider: In office    I discussed the limitations of evaluation and management by telemedicine and the availability of in person appointments. The patient expressed understanding and agreed to proceed.  History of Present Illness:  Weight 135lbs, recent BP 104/72 surgery note  Virtual visit  in the setting of COVID-19 pandemic.  Medications reviewed.  He is currently being followed by oncology secondary to history of tonsillar carcinoma.  He has completed his chemo and radiation but states that he has chronic pain daily as well as fatigue he feels worse after going through treatments.  I reviewed the last oncology note while patient was on the phone.  It was recommended that he follow-up with ENT laryngoscopic but he does not want to proceed with anything he states that he does have recurrence of cancer he does not want any further treatment.  He is scheduled to have a repeat CT scan in 6 months along with labs.  He is scheduled to have his port removed by his general surgeon on Wednesday of this week.  He is holding his Plavix and his aspirin.  His appetite is on and off.  He still drinking Ensure twice a day but does seem like it is giving him a loose stool forcing in the morning.  He does not have any abdominal pain nausea or vomiting.  He has been maintaining his weight 535 pounds he states that he was up to 139 but it came right back down.  Chronic pain he has chronic back pain but also restless leg pain he has peripheral vascular disease.  States that he has now been experiencing a burning sensation in his legs and feet for the past couple months.  He cannot sleep because of the discomfort.   Observations/Objective: No acute distress noted over video.  Normal  work of breathing. Psych normal affect and mood   Assessment and Plan: Chronic pain with peripheral neuropathy symptoms.  I think the neuropathy symptoms could be related to his recent chemoradiation therapy.  But he also has underlying vascular disease and degenerative disc disease.  We will try gabapentin 100 mg 1 to 2 capsules at bedtime.  Continue his current hydrocodone as prescribed.  He is due for port removal he will stay off of the aspirin and Plavix as directed by the surgeon.  Chronic diarrhea though not severe.  This may be related to the insurance so because he only has 1 loose stool in the morning.  He had labs done in November which I reviewed with him which were fairly unremarkable.  He is taking supplements for some mild chronic anemia as well as B12 magnesium and calcium and vitamin D.  Protein malnutrition in the setting of his cancer diagnosis.  This also helps with his depressed mood.  He is on mirtazapine 30 mg at bedtime I think we can maintain his weight between 135 and 140 we are good.   Follow Up Instructions: Feb 14, 8am, WILL REpeat labs    I discussed the assessment and treatment plan with the patient. The patient was provided an opportunity to ask questions and all were answered. The patient agreed with the plan and demonstrated an understanding of the instructions.   The patient was advised to call back  or seek an in-person evaluation if the symptoms worsen or if the condition fails to improve as anticipated.  I provided 20 minutes of non-face-to-face time during this encounter. End Time 8:32am   Vic Blackbird, MD

## 2020-03-06 ENCOUNTER — Ambulatory Visit (HOSPITAL_COMMUNITY)
Admission: RE | Admit: 2020-03-06 | Discharge: 2020-03-06 | Disposition: A | Discharge status: Home / Self Care | Payer: Medicare Other | Attending: General Surgery | Admitting: General Surgery

## 2020-03-06 ENCOUNTER — Encounter (HOSPITAL_COMMUNITY)
Admission: RE | Disposition: A | Discharge status: Home / Self Care | Payer: Self-pay | Source: Home / Self Care | Attending: General Surgery

## 2020-03-06 ENCOUNTER — Encounter (HOSPITAL_COMMUNITY): Payer: Self-pay | Admitting: General Surgery

## 2020-03-06 ENCOUNTER — Ambulatory Visit (HOSPITAL_COMMUNITY): Payer: Medicare Other

## 2020-03-06 DIAGNOSIS — Z8249 Family history of ischemic heart disease and other diseases of the circulatory system: Secondary | ICD-10-CM | POA: Diagnosis not present

## 2020-03-06 DIAGNOSIS — Z8546 Personal history of malignant neoplasm of prostate: Secondary | ICD-10-CM | POA: Insufficient documentation

## 2020-03-06 DIAGNOSIS — Z8349 Family history of other endocrine, nutritional and metabolic diseases: Secondary | ICD-10-CM | POA: Insufficient documentation

## 2020-03-06 DIAGNOSIS — Z833 Family history of diabetes mellitus: Secondary | ICD-10-CM | POA: Diagnosis not present

## 2020-03-06 DIAGNOSIS — Z7982 Long term (current) use of aspirin: Secondary | ICD-10-CM | POA: Diagnosis not present

## 2020-03-06 DIAGNOSIS — Z800 Family history of malignant neoplasm of digestive organs: Secondary | ICD-10-CM | POA: Insufficient documentation

## 2020-03-06 DIAGNOSIS — Z9221 Personal history of antineoplastic chemotherapy: Secondary | ICD-10-CM | POA: Diagnosis not present

## 2020-03-06 DIAGNOSIS — Z885 Allergy status to narcotic agent status: Secondary | ICD-10-CM | POA: Insufficient documentation

## 2020-03-06 DIAGNOSIS — Z7902 Long term (current) use of antithrombotics/antiplatelets: Secondary | ICD-10-CM | POA: Insufficient documentation

## 2020-03-06 DIAGNOSIS — R918 Other nonspecific abnormal finding of lung field: Secondary | ICD-10-CM | POA: Diagnosis not present

## 2020-03-06 DIAGNOSIS — F17210 Nicotine dependence, cigarettes, uncomplicated: Secondary | ICD-10-CM | POA: Insufficient documentation

## 2020-03-06 DIAGNOSIS — Z95828 Presence of other vascular implants and grafts: Secondary | ICD-10-CM | POA: Diagnosis not present

## 2020-03-06 DIAGNOSIS — Z452 Encounter for adjustment and management of vascular access device: Secondary | ICD-10-CM

## 2020-03-06 DIAGNOSIS — C099 Malignant neoplasm of tonsil, unspecified: Secondary | ICD-10-CM | POA: Diagnosis not present

## 2020-03-06 DIAGNOSIS — I700 Atherosclerosis of aorta: Secondary | ICD-10-CM | POA: Diagnosis not present

## 2020-03-06 DIAGNOSIS — Z79899 Other long term (current) drug therapy: Secondary | ICD-10-CM | POA: Insufficient documentation

## 2020-03-06 HISTORY — PX: PORT-A-CATH REMOVAL: SHX5289

## 2020-03-06 SURGERY — MINOR REMOVAL PORT-A-CATH
Anesthesia: LOCAL | Site: Chest | Laterality: Right

## 2020-03-06 MED ORDER — CHLORHEXIDINE GLUCONATE CLOTH 2 % EX PADS: 6.0000 | MEDICATED_PAD | Freq: Once | CUTANEOUS | Status: DC

## 2020-03-06 MED ORDER — LIDOCAINE HCL (PF) 1 % IJ SOLN
INTRAMUSCULAR | Status: DC | PRN
  Administered 2020-03-06: 5 mL

## 2020-03-06 MED ORDER — LIDOCAINE HCL (PF) 1 % IJ SOLN
INTRAMUSCULAR | Status: AC
  Filled 2020-03-06: qty 30

## 2020-03-06 SURGICAL SUPPLY — 19 items
ADH SKN CLS APL DERMABOND .7 (GAUZE/BANDAGES/DRESSINGS) ×1
APL PRP STRL LF ISPRP CHG 10.5 (MISCELLANEOUS) ×1
APPLICATOR CHLORAPREP 10.5 ORG (MISCELLANEOUS) ×2 IMPLANT
CLOTH BEACON ORANGE TIMEOUT ST (SAFETY) ×2 IMPLANT
COVER WAND RF STERILE (DRAPES) ×2 IMPLANT
DECANTER SPIKE VIAL GLASS SM (MISCELLANEOUS) ×2 IMPLANT
DERMABOND ADVANCED (GAUZE/BANDAGES/DRESSINGS) ×1
DERMABOND ADVANCED .7 DNX12 (GAUZE/BANDAGES/DRESSINGS) ×1 IMPLANT
DRAPE HALF SHEET 40X57 (DRAPES) ×2 IMPLANT
GLOVE BIO SURGEON STRL SZ 6.5 (GLOVE) IMPLANT
GLOVE BIOGEL PI IND STRL 6.5 (GLOVE) ×1 IMPLANT
GLOVE BIOGEL PI IND STRL 7.0 (GLOVE) ×1 IMPLANT
GLOVE BIOGEL PI INDICATOR 6.5 (GLOVE) ×1
GLOVE BIOGEL PI INDICATOR 7.0 (GLOVE) ×1
GOWN STRL REUS W/TWL LRG LVL3 (GOWN DISPOSABLE) ×2 IMPLANT
SPONGE GAUZE 2X2 8PLY STRL LF (GAUZE/BANDAGES/DRESSINGS) ×2 IMPLANT
SUT MNCRL AB 4-0 PS2 18 (SUTURE) ×2 IMPLANT
SUT VIC AB 3-0 SH 27 (SUTURE) ×2
SUT VIC AB 3-0 SH 27X BRD (SUTURE) ×1 IMPLANT

## 2020-03-06 NOTE — Discharge Instructions (Signed)
Keep area clean and dry. You can take a shower in 24 hours. Do not submerge in water.  Take tylenol and ibuprofen for pain control and Norco for severe pain. Restart Plavix on 03/09/2020- Saturday.    Implanted Port Removal, Care After This sheet gives you information about how to care for yourself after your procedure. Your health care provider may also give you more specific instructions. If you have problems or questions, contact your health care provider. What can I expect after the procedure? After the procedure, it is common to have:  Soreness or pain near your incision.  Some swelling or bruising near your incision. Follow these instructions at home: Medicines  Take over-the-counter and prescription medicines only as told by your health care provider.  If you were prescribed an antibiotic medicine, take it as told by your health care provider. Do not stop taking the antibiotic even if you start to feel better. Bathing  Do not take baths, swim, or use a hot tub until your health care provider approves.   You may shower. Incision care  Follow instructions from your health care provider about how to take care of your incision. Make sure you: ? Wash your hands with soap and water before you change your bandage (dressing). If soap and water are not available, use hand sanitizer. ? Change your dressing as told by your health care provider. ? Keep your dressing dry. ? Leave stitches (sutures), skin glue, or adhesive strips in place. These skin closures may need to stay in place for 2 weeks or longer. If adhesive strip edges start to loosen and curl up, you may trim the loose edges. Do not remove adhesive strips completely unless your health care provider tells you to do that.  Check your incision area every day for signs of infection. Check for: ? More redness, swelling, or pain. ? More fluid or blood. ? Warmth. ? Pus or a bad smell.   Driving  Do not drive for 24 hours if you  were given a medicine to help you relax (sedative) during your procedure.  If you did not receive a sedative, ask your health care provider when it is safe to drive.   Activity  Return to your normal activities as told by your health care provider. Ask your health care provider what activities are safe for you.  Do not lift anything that is heavier than 10 lb (4.5 kg), or the limit that you are told, until your health care provider says that it is safe.  Do not do activities that involve lifting your arms over your head. General instructions  Do not use any products that contain nicotine or tobacco, such as cigarettes and e-cigarettes. These can delay healing. If you need help quitting, ask your health care provider.  Keep all follow-up visits as told by your health care provider. This is important. Contact a health care provider if:  You have more redness, swelling, or pain around your incision.  You have more fluid or blood coming from your incision.  Your incision feels warm to the touch.  You have pus or a bad smell coming from your incision.  You have pain that is not relieved by your pain medicine. Get help right away if you have:  A fever or chills.  Chest pain.  Difficulty breathing. Summary  After the procedure, it is common to have pain, soreness, swelling, or bruising near your incision.  If you were prescribed an antibiotic medicine, take it  as told by your health care provider. Do not stop taking the antibiotic even if you start to feel better.  Do not drive for 24 hours if you were given a sedative during your procedure.  Return to your normal activities as told by your health care provider. Ask your health care provider what activities are safe for you. This information is not intended to replace advice given to you by your health care provider. Make sure you discuss any questions you have with your health care provider. Document Revised: 03/25/2017 Document  Reviewed: 03/25/2017 Elsevier Patient Education  2021 [employer].

## 2020-03-06 NOTE — Interval H&P Note (Signed)
History and Physical Interval Note:  03/06/2020 11:53 AM  Cory Foley  has presented today for surgery, with the diagnosis of Port in place.  The various methods of treatment have been discussed with the patient and family. After consideration of risks, benefits and other options for treatment, the patient has consented to  Procedure(s): MINOR REMOVAL PORT-A-CATH (Right) as a surgical intervention.  The patient's history has been reviewed, patient examined, no change in status, stable for surgery.  I have reviewed the patient's chart and labs.  Questions were answered to the patient's satisfaction.     [name]

## 2020-03-06 NOTE — Op Note (Signed)
[facility] Operative Note  03/06/20  Preoperative Diagnosis: Port in place, right internal jugular    Postoperative Diagnosis: Same   Procedure(s) Performed: Removal of port a catheter   Surgeon: Lanell Matar. Constance Haw, Cory Foley   Assistants: No qualified resident was available    Anesthesia: 1% lidocaine    Specimens: None   Estimated Blood Loss: Minimal   Blood Replacement: None    Complications: None   Wound Class: Clean    Operative Indications: [name] is a 70 yo with a right sided port in place who no longer wants it. He has received chemotherapy for tonsillar cancer. We discussed excision under local and risk of bleeding, infection, incomplete removal, pneumothorax, and he opted to proceed. He held his plavix prior to the procedure.    Procedure: The patient was taken to the procedure room and placed semi upright. The right chest and neck were prepared and draped in the usual sterile fashion.   Lidocaine 1% was injected over the port incision and in the right internal jugular incision site.  The incision was opened and carried down through to the capsule. The port was encountered and removed in its entirety. My assistant held pressure on the right neck with removal and held pressure for 10 minutes.    The cavity was irrigated and made hemostatic. The cavity was closed with 3-0 interrupted Vicryl and 4-0 running subcuticular Monocryl.   Final inspection revealed acceptable hemostasis. All counts were correct at the end of the case. The patient tolerated the procedure and a CXR was done to confirm removal.    Cory Labrum, Cory Foley [facility] [address] [place],  [id number] [phone] (office)

## 2020-03-08 ENCOUNTER — Encounter (HOSPITAL_COMMUNITY): Payer: Self-pay | Admitting: General Surgery

## 2020-03-27 ENCOUNTER — Telehealth: Payer: Self-pay | Admitting: Pharmacist

## 2020-03-27 NOTE — Progress Notes (Addendum)
Chronic Care Management Pharmacy Assistant   Name: Cory Foley  MRN: [id number] DOB: May 08, 1950  Reason for Encounter: General Disease State Call  Patient Questions:  1.  Have you seen any other providers since your last visit? Yes.   2.  Any changes in your medicines or health? No.   PCP : Alycia Rossetti, MD   Their chronic conditions include: GERD, osteoarthritis, hyperlipidemia, RLS, depression.  Office Visits:  03/04/20 (Video Visits)  Dr. Buelah Manis. For restless leg. STARTED Ferrous Sulfate 325 mg and Gabapentin 100 mg 1-2 capsules at bedtime. STOPPED Dronabinol 5 mg.  Consults: 02/27/20 Gen Surgery [name], MD. Discussed port removal, informed to hold Plavix for 5 days for minor surgery.  Hospital:  03/06/20 [name], MD. Removal of port-a-cath. No medication changes.  Allergies:   Allergies  Allergen Reactions   Codeine Other (See Comments)    Bad headache and sweats   Morphine And Related Itching    sweats   Percocet [Oxycodone-Acetaminophen] Other (See Comments)    Headache and sweats    Medications: Outpatient Encounter Medications as of 03/27/2020  Medication Sig   aspirin EC 81 MG tablet Take 81 mg by mouth daily.   calcium carbonate (OS-CAL) 600 MG tablet Take 1 tablet (600 mg total) by mouth 2 (two) times daily with a meal. (Patient taking differently: Take 600 mg by mouth 2 (two) times daily.)   Cholecalciferol (VITAMIN D) 50 MCG (2000 UT) tablet Take 2,000 Units by mouth in the morning and at bedtime.   clopidogrel (PLAVIX) 75 MG tablet TAKE 1 TABLET BY MOUTH ONCE A DAY. (Patient taking differently: Take 75 mg by mouth daily.)   cyanocobalamin 1000 MCG tablet Take 1,000 mcg by mouth 2 (two) times daily.   famotidine (PEPCID) 20 MG tablet Take 1 tablet (20 mg total) by mouth at bedtime.   Ferrous Sulfate (IRON) 325 (65 Fe) MG TABS 1 tablet daily   gabapentin (NEURONTIN) 100 MG capsule Take 1-2 capsules at bedtime for nerve pain    HYDROcodone-acetaminophen (NORCO) 10-325 MG tablet TAKE 1 TABLET BY MOUTH EVERY FOUR HOURS AS NEEDED. (Patient taking differently: Take 1 tablet by mouth every 4 (four) hours as needed (pain).)   magnesium oxide (MAG-OX) 400 (241.3 Mg) MG tablet Take 1 tablet (400 mg total) by mouth 2 (two) times daily.   mirtazapine (REMERON) 30 MG tablet TAKE ONE TABLET BY MOUTH ONCE EVERY EVENING AT BEDTIME (Patient taking differently: Take 30 mg by mouth at bedtime.)   pantoprazole (PROTONIX) 40 MG tablet Take 1 tablet (40 mg total) by mouth daily.   rOPINIRole (REQUIP) 3 MG tablet TAKE (1) TABLET BY MOUTH AT BEDTIME.   simvastatin (ZOCOR) 40 MG tablet Take 1 tablet (40 mg total) by mouth at bedtime.   No facility-administered encounter medications on file as of 03/27/2020.    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   Metastatic squamous cell carcinoma ([place]) 03/20/2019   Migraines 12/26/2018   MDD (major depressive disorder) 07/13/2018   GERD (gastroesophageal reflux disease) 04/08/2018   Osteoporosis 05/01/2015   DDD (degenerative disc disease), lumbar 12/10/2013   Dizziness and giddiness 08/18/2013   Aftercare following surgery of the circulatory system, NEC 06/26/2013   Pain in limb-Bilateral foot 06/26/2013   Insomnia 06/07/2013   COPD (chronic obstructive pulmonary disease) ([place]) 12/07/2012   Back pain 12/07/2012  RLS (restless legs syndrome) 12/07/2012   Adrenal adenoma 10/09/2012   Chronic fatigue 10/09/2012   Chronic epigastric pain 09/15/2012   Protein-calorie malnutrition ([place]) 08/24/2012   Prostate cancer ([place]) 01/11/2012   Coccyxdynia 11/11/2011   PVD (peripheral vascular disease) ([place]) 10/09/2010   Hyperlipidemia 07/20/2008   TOBACCO ABUSE 07/20/2008   Osteoarthritis 07/20/2008    Goals Addressed   None    Patient stated his port removal area is  completely healed from the surgery. He stated he is very active, he has a work shop outside and he's out there daily. He started gabapentin 100 mg and he stated the medication has helped a lot. Patient stated he has a big appetite even though he's not gaining weight. He stated he usually eats lunch and dinner he's not a big breakfast person. Patient stated he very rarely he eats processed foods and his wife never makes fired foods. Patient stated he doesn't have any concerns about his medication at this time.  Follow-Up:  Pharmacist Review   Charlann Lange, RMA Clinical Pharmacist Assistant [phone]  6 minutes spent in review, coordination, and documentation.  Reviewed by: Beverly Milch, PharmD Clinical Pharmacist [facility] [phone]

## 2020-03-29 ENCOUNTER — Other Ambulatory Visit: Payer: Self-pay | Admitting: Family Medicine

## 2020-04-08 ENCOUNTER — Encounter: Payer: Self-pay | Admitting: Family Medicine

## 2020-04-08 ENCOUNTER — Other Ambulatory Visit: Payer: Self-pay

## 2020-04-08 ENCOUNTER — Ambulatory Visit (INDEPENDENT_AMBULATORY_CARE_PROVIDER_SITE_OTHER): Payer: Medicare Other | Admitting: Family Medicine

## 2020-04-08 VITALS — BP 114/80 | HR 60 | Temp 97.4°F | Ht 68.0 in | Wt 144.0 lb

## 2020-04-08 DIAGNOSIS — I739 Peripheral vascular disease, unspecified: Secondary | ICD-10-CM

## 2020-04-08 DIAGNOSIS — R5382 Chronic fatigue, unspecified: Secondary | ICD-10-CM

## 2020-04-08 DIAGNOSIS — J42 Unspecified chronic bronchitis: Secondary | ICD-10-CM | POA: Diagnosis not present

## 2020-04-08 DIAGNOSIS — E7800 Pure hypercholesterolemia, unspecified: Secondary | ICD-10-CM | POA: Diagnosis not present

## 2020-04-08 DIAGNOSIS — G2581 Restless legs syndrome: Secondary | ICD-10-CM | POA: Diagnosis not present

## 2020-04-08 DIAGNOSIS — E440 Moderate protein-calorie malnutrition: Secondary | ICD-10-CM

## 2020-04-08 LAB — CBC WITH DIFFERENTIAL/PLATELET
Absolute Monocytes: 406 cells/uL (ref 200–950)
Basophils Absolute: 29 cells/uL (ref 0–200)
Basophils Relative: 0.7 %
Eosinophils Absolute: 123 cells/uL (ref 15–500)
Eosinophils Relative: 3 %
HCT: 35.3 % — ABNORMAL LOW (ref 38.5–50.0)
Hemoglobin: 11.9 g/dL — ABNORMAL LOW (ref 13.2–17.1)
Lymphs Abs: 1054 cells/uL (ref 850–3900)
MCH: 34.2 pg — ABNORMAL HIGH (ref 27.0–33.0)
MCHC: 33.7 g/dL (ref 32.0–36.0)
MCV: 101.4 fL — ABNORMAL HIGH (ref 80.0–100.0)
MPV: 10.2 fL (ref 7.5–12.5)
Monocytes Relative: 9.9 %
Neutro Abs: 2489 cells/uL (ref 1500–7800)
Neutrophils Relative %: 60.7 %
Platelets: 208 10*3/uL (ref 140–400)
RBC: 3.48 10*6/uL — ABNORMAL LOW (ref 4.20–5.80)
RDW: 13.2 % (ref 11.0–15.0)
Total Lymphocyte: 25.7 %
WBC: 4.1 10*3/uL (ref 3.8–10.8)

## 2020-04-08 LAB — COMPREHENSIVE METABOLIC PANEL
AG Ratio: 1.9 (calc) (ref 1.0–2.5)
ALT: 11 U/L (ref 9–46)
AST: 14 U/L (ref 10–35)
Albumin: 4.2 g/dL (ref 3.6–5.1)
Alkaline phosphatase (APISO): 45 U/L (ref 35–144)
BUN: 12 mg/dL (ref 7–25)
CO2: 31 mmol/L (ref 20–32)
Calcium: 9.7 mg/dL (ref 8.6–10.3)
Chloride: 104 mmol/L (ref 98–110)
Creat: 1 mg/dL (ref 0.70–1.25)
Globulin: 2.2 g/dL (calc) (ref 1.9–3.7)
Glucose, Bld: 86 mg/dL (ref 65–99)
Potassium: 4.8 mmol/L (ref 3.5–5.3)
Sodium: 139 mmol/L (ref 135–146)
Total Bilirubin: 0.3 mg/dL (ref 0.2–1.2)
Total Protein: 6.4 g/dL (ref 6.1–8.1)

## 2020-04-08 LAB — LIPID PANEL
Cholesterol: 185 mg/dL (ref <200)
HDL: 58 mg/dL (ref >=40)
LDL Cholesterol (Calc): 106 mg/dL (calc) — ABNORMAL HIGH
Non-HDL Cholesterol (Calc): 127 mg/dL (calc) (ref <130)
Total CHOL/HDL Ratio: 3.2 (calc) (ref <5.0)
Triglycerides: 112 mg/dL (ref <150)

## 2020-04-08 NOTE — Assessment & Plan Note (Signed)
He is maintaining his weight with mirtazapine.

## 2020-04-08 NOTE — Assessment & Plan Note (Signed)
Continue on Requip. 

## 2020-04-08 NOTE — Patient Instructions (Signed)
We will call with results

## 2020-04-08 NOTE — Assessment & Plan Note (Signed)
No recent exacerbations.  

## 2020-04-08 NOTE — Assessment & Plan Note (Signed)
He has been seen by vascular.  He is on statin drug.  He has underlying neuropathy symptoms this is controlled with gabapentin.

## 2020-04-08 NOTE — Progress Notes (Signed)
Subjective:    Patient ID: Cory Foley, male    DOB: Sep 30, 1950, 70 y.o.   MRN: [id number]  Patient presents for Follow-up (F/u on gabapentin, me is working fine , no side effect)  Follow-up chronic medical problems.  Medications reviewed.  He had telehealth visit back in January.  He has completed his chemo and radiation but states that he has chronic pain daily as well as fatigue he feels worse after going through treatments.  I reviewed the last oncology note while patient was on the phone.  It was recommended that he follow-up with ENT laryngoscopic but he does not want to proceed with anything he states that he does have recurrence of cancer he does not want any further treatment.  He is scheduled to have a repeat CT scan in 6 months along with labs.  He did have his port removed since her last visit.    Protein malnutrition he tries to maintain his appetite but has difficulty.  He does drink Ensure but it gives him some loose stools.  Discontinued on mirtazapine 30 mg at bedtime with a goal to keep his weight between 1 35-1 40  Chronic pain he has chronic back pain but also restless leg pain he has peripheral vascular disease.    At the last visit he was also experiencing some burning sensation in his feet and legs consistent with neuropathy the setting of his restless leg.Marland Kitchen He was started on gabapentin 100 mg 1 to 2 capsules at bedtime and continued on his hydrocodone.  The gabapentin has helped with his neuropathy symptoms.  He is due for repeat labs today.  He is going to establish with Dr. Posey Pronto  COPD he has not had any exacerbations. Review Of Systems:  GEN- denies fatigue, fever, weight loss,weakness, recent illness HEENT- denies eye drainage, change in vision, nasal discharge, CVS- denies chest pain, palpitations RESP- denies SOB, cough, wheeze ABD- denies N/V, change in stools, abd pain GU- denies dysuria, hematuria, dribbling, incontinence MSK- denies joint pain,  muscle aches, injury Neuro- denies headache, dizziness, syncope, seizure activity       Objective:    BP 114/80   Pulse 60   Temp (!) 97.4 F (36.3 C)   Ht 5\' 8"  (1.727 m)   Wt 144 lb (65.3 kg)   SpO2 98%   BMI 21.90 kg/m  GEN- NAD, alert and oriented x3 HEENT- PERRL, EOMI, non injected sclera, pink conjunctiva, MMM, oropharynx clear Neck- Supple, no thyromegaly CVS- RRR, no murmur RESP-CTAB ABD-NABS,soft,NT,ND EXT- No edema Pulses- Radial, DP- 2+        Assessment & Plan:      Problem List Items Addressed This Visit      Unprioritized   Chronic fatigue   COPD (chronic obstructive pulmonary disease) ([place]) - Primary    No recent exacerbations.      Hyperlipidemia   Relevant Orders   CBC with Differential/Platelet   Comprehensive metabolic panel   Lipid panel   Protein-calorie malnutrition ([place])    He is maintaining his weight with mirtazapine.      Relevant Orders   CBC with Differential/Platelet   Comprehensive metabolic panel   PVD (peripheral vascular disease) ([place])    He has been seen by vascular.  He is on statin drug.  He has underlying neuropathy symptoms this is controlled with gabapentin.      RLS (restless legs syndrome)    Continue on Requip.  Note: This dictation was prepared with Dragon dictation along with smaller phrase technology. Any transcriptional errors that result from this process are unintentional.

## 2020-04-16 ENCOUNTER — Other Ambulatory Visit: Payer: Self-pay | Admitting: *Deleted

## 2020-04-16 MED ORDER — GABAPENTIN 100 MG PO CAPS: ORAL_CAPSULE | ORAL | 3 refills | Status: DC

## 2020-04-16 NOTE — Telephone Encounter (Signed)
Received call from patient.   Reports that Gabapentin. Reports that medication is helping his neuropathy. States that he is currently taking (2) caps at bedtime.    Prescription sent to pharmacy for increased quantity.

## 2020-04-22 ENCOUNTER — Telehealth: Payer: Self-pay | Admitting: Pharmacist

## 2020-04-22 NOTE — Progress Notes (Signed)
    Chronic Care Management Pharmacy Assistant   Name: Cory Foley  MRN: [id number] DOB: 05-Jun-1950  Reason for Encounter: Adherence Review  PCP : Alycia Rossetti, MD  Verified Adherence Gap Information. Per insurance data, the patient is 90-99% compliant with the CHOL (Simvastatin) medication. Per insurance data patient has met their wellness bundle and annual wellness visit screening. The patient colorectal cancer screening is current. Their most recent A1C was 5.5 on 03/13/16 and their most recent blood pressure was 92/57 on 11/16/19. The patient met their blood pressure goal by keeping their blood pressure below 140/90.  Follow-Up:  Pharmacist Review   Charlann Lange, [place] Pharmacist Assistant [phone]

## 2020-04-25 ENCOUNTER — Other Ambulatory Visit: Payer: Self-pay | Admitting: Family Medicine

## 2020-04-25 NOTE — Telephone Encounter (Signed)
Ok to refill??  Last office visit 04/08/2020.  Last refill 03/29/2020.

## 2020-05-13 ENCOUNTER — Other Ambulatory Visit: Payer: Self-pay

## 2020-05-13 ENCOUNTER — Ambulatory Visit (INDEPENDENT_AMBULATORY_CARE_PROVIDER_SITE_OTHER): Payer: Medicare Other | Admitting: Internal Medicine

## 2020-05-13 ENCOUNTER — Encounter: Payer: Self-pay | Admitting: Internal Medicine

## 2020-05-13 VITALS — BP 119/71 | HR 87 | Temp 98.0°F | Resp 18 | Ht 68.0 in | Wt 150.1 lb

## 2020-05-13 DIAGNOSIS — I739 Peripheral vascular disease, unspecified: Secondary | ICD-10-CM | POA: Diagnosis not present

## 2020-05-13 DIAGNOSIS — G2581 Restless legs syndrome: Secondary | ICD-10-CM | POA: Diagnosis not present

## 2020-05-13 DIAGNOSIS — M810 Age-related osteoporosis without current pathological fracture: Secondary | ICD-10-CM | POA: Diagnosis not present

## 2020-05-13 DIAGNOSIS — G6289 Other specified polyneuropathies: Secondary | ICD-10-CM | POA: Diagnosis not present

## 2020-05-13 DIAGNOSIS — H538 Other visual disturbances: Secondary | ICD-10-CM | POA: Diagnosis not present

## 2020-05-13 DIAGNOSIS — Z7689 Persons encountering health services in other specified circumstances: Secondary | ICD-10-CM

## 2020-05-13 DIAGNOSIS — C61 Malignant neoplasm of prostate: Secondary | ICD-10-CM

## 2020-05-13 DIAGNOSIS — F17200 Nicotine dependence, unspecified, uncomplicated: Secondary | ICD-10-CM | POA: Diagnosis not present

## 2020-05-13 DIAGNOSIS — K219 Gastro-esophageal reflux disease without esophagitis: Secondary | ICD-10-CM | POA: Diagnosis not present

## 2020-05-13 DIAGNOSIS — C099 Malignant neoplasm of tonsil, unspecified: Secondary | ICD-10-CM | POA: Diagnosis not present

## 2020-05-13 DIAGNOSIS — F17210 Nicotine dependence, cigarettes, uncomplicated: Secondary | ICD-10-CM | POA: Diagnosis not present

## 2020-05-13 DIAGNOSIS — G629 Polyneuropathy, unspecified: Secondary | ICD-10-CM | POA: Insufficient documentation

## 2020-05-13 MED ORDER — GABAPENTIN 300 MG PO CAPS: 300.0000 mg | ORAL_CAPSULE | Freq: Every day | ORAL | 2 refills | Status: DC

## 2020-05-13 NOTE — Assessment & Plan Note (Addendum)
S/p  common iliac artery stent placement  On Plavix and statin

## 2020-05-13 NOTE — Assessment & Plan Note (Signed)
Care established Previous chart reviewed History and medications reviewed with the patient 

## 2020-05-13 NOTE — Progress Notes (Signed)
New Patient Office Visit  Subjective:  Patient ID: Cory Foley, male    DOB: [birth year]  Age: 70 y.o. MRN: [id number]  CC:  Chief Complaint  Patient presents with  . New Patient (Initial Visit)    New patient has problems out of legs like nerves dr Buelah Manis put on gabapentin but they still ache and burn     HPI Cory Foley is a 70 year old male with PMH of palatine tonsil ca. s/p chemotherapy, prostate ca. s/p  radiotherapy, PVD s/p common iliac artery stent placement, osteoporosis, chronic low back pain, restless legs syndrome and insomnia who  presents for establishing care. He is a former patient of Dr Buelah Manis.  He follows up with Oncologist for h/o tonsil ca. He has had radiotherapy and is going to get follow up imaging. Denies any dysphagia or odynophagia. No speech difficulty.  He has h/o peripheral neuropathy, for which he has been taking Gabapentin 200 mg qHS. He still c/o burning pain and tingling in the feet. He also takes Norco for chronic low back pain.  He takes Remeron for insomnia.  He has h/o PVD and has had common iliac artery stent placement. He is on DAPT and statin.  He has h/o prostate ca. s/p radiotherapy and has been in remission since. Denies any urinary hesitancy, dysuria or hematuria.  He c/o right sided blurry vision for last 2 months. Denies any floaters. Denies any redness, but has been having watery eyes. He had Ophthalmology visit scheduled, but could not attend it and now he needs to reschedule appointment.  Past Medical History:  Diagnosis Date  . Allergy   . Anxiety   . Arthritis   . BPH with obstruction/lower urinary tract symptoms   . COPD, mild ([place])    current smoker  . ED (erectile dysfunction)   . GERD (gastroesophageal reflux disease)   . Hyperlipidemia   . Nocturia   . Osteoporosis   . PAD (peripheral artery disease) ([place])   . Port-A-Cath in place 04/28/2019   Right  . Prostate cancer ([place]) 01/11/2012   Adenocarcinoma,  Gleason=4+4=8, & 4+5=9,PSA=17.24, Volume= 15.45cc  . PVD (peripheral vascular disease) ([place])   . Restless leg syndrome   . Tongue mass    SCC    Past Surgical History:  Procedure Laterality Date  . ABDOMINAL AORTOGRAM W/LOWER EXTREMITY N/A 08/16/2018   Procedure: ABDOMINAL AORTOGRAM W/LOWER EXTREMITY;  Surgeon: Serafina Mitchell, MD;  Location: [place] [facility];  Service: Cardiovascular;  Laterality: N/A;  Bilateral  . COLONOSCOPY N/A 09/28/2012   Procedure: COLONOSCOPY;  Surgeon: Danie Binder, MD;  [facility];  Service: Endoscopy;  Laterality: N/A;  8:30  . DIRECT LARYNGOSCOPY N/A 04/03/2019   Procedure: DIRECT LARYNGOSCOPY;  Surgeon: Leta Baptist, MD;  Location: [facility];  Service: ENT;  Laterality: N/A;  . ESOPHAGOGASTRODUODENOSCOPY N/A 09/28/2012   Procedure: ESOPHAGOGASTRODUODENOSCOPY (EGD);  Surgeon: Danie Binder, MD;  [facility];  Service: Endoscopy;  Laterality: N/A;  . Gold seed implatation  04/28/2012  . HERNIA REPAIR     Bilateral inguinal X2  . IR IMAGING GUIDED PORT INSERTION  05/03/2019   Right  . JOINT REPLACEMENT     bilateral  . KNEE SURGERY     Left Knee X 2   and Right knee X1  . MALONEY DILATION N/A 09/28/2012   Procedure: MALONEY DILATION;  Surgeon: Danie Binder, MD;  [facility];  Service: Endoscopy;  Laterality: N/A;  .  NOSE SURGERY    . PERIPHERAL VASCULAR INTERVENTION  08/16/2018   Procedure: PERIPHERAL VASCULAR INTERVENTION;  Surgeon: Serafina Mitchell, MD;  Location: [facility];  Service: Cardiovascular;;  LT Iliac  . PORT-A-CATH REMOVAL Right 03/06/2020   Procedure: MINOR REMOVAL PORT-A-CATH;  Surgeon: [name], MD;  Location: [facility];  Service: General;  Laterality: Right;  . PR VEIN BYPASS GRAFT,AORTO-FEM-POP  10/21/10   Left AK to BK popliteal BPG  . PROSTATE BIOPSY  01/11/2012   Adenocarcinoma  . PROSTATE SURGERY  2015   Chemo and  Radiation  . SAVORY DILATION N/A 09/28/2012    Procedure: SAVORY DILATION;  Surgeon: Danie Binder, MD;  [facility];  Service: Endoscopy;  Laterality: N/A;  . throat biopsy  03/20/2019  . TONSILLECTOMY Left 04/03/2019   Procedure: TONSILLECTOMY;  Surgeon: Leta Baptist, MD;  Location: [facility];  Service: ENT;  Laterality: Left;    Family History  Problem Relation Age of Onset  . Heart disease Mother        Valve regurgitation and Pacemaker   . Diabetes Mother   . Hyperlipidemia Mother   . Heart disease Father        CABG x 5  . Hyperlipidemia Father   . Hypertension Father   . Heart attack Father   . Heart disease Sister        aortic valve replacement  . Cancer Sister 92       Colon cancer w/ metastasis  . Throat cancer Sister   . Hypertension Brother   . Heart disease Maternal Grandmother   . Heart disease Maternal Grandfather   . Heart disease Paternal Grandmother   . Cancer Paternal Grandfather   . Healthy Son   . Healthy Daughter     Social History   Socioeconomic History  . Marital status: Married    Spouse name: Holley Raring  . Number of children: 2  . Years of education: College  . 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 Smoking status: Current Every Day Smoker    Packs/day: 0.50    Years: 43.00    Pack years: 21.50    Types: Cigarettes  . Smokeless tobacco: Never Used  . Tobacco comment: 1/2 ppd  Vaping Use  . Vaping Use: Never used  Substance and Sexual Activity  . Alcohol use: No    Comment: quit 1.5 years ago  . Drug use: No  . Sexual activity: Not Currently  Other Topics Concern  . Not on file  Social History Narrative   Patient lives at home with spouse.   Caffeine use: very little   Social Determinants of Radio broadcast assistant Strain: Not on file  Food Insecurity: Not on file  Transportation Needs: Not on file  Physical  Activity: Not on file  Stress: Not on file  Social Connections: Not on file  Intimate Partner Violence: Not on file    ROS Review of Systems  Constitutional: Negative for chills and fever.  HENT: Negative for congestion and sore throat.   Eyes: Positive for discharge and visual disturbance. Negative for pain.  Respiratory: Negative for cough and shortness of breath.   Cardiovascular: Negative for chest pain and palpitations.  Gastrointestinal: Negative for constipation, diarrhea, nausea and vomiting.  Endocrine: Negative for polydipsia and  polyuria.  Genitourinary: Negative for dysuria and hematuria.  Musculoskeletal: Positive for arthralgias and back pain. Negative for neck pain and neck stiffness.  Skin: Negative for rash.  Neurological: Negative for dizziness, weakness, numbness and headaches.  Psychiatric/Behavioral: Negative for agitation and behavioral problems.    Objective:   Today's Vitals: BP 119/71 (BP Location: Right Arm, Patient Position: Sitting, Cuff Size: Normal)   Pulse 87   Temp 98 F (36.7 C) (Oral)   Resp 18   Ht 5\' 8"  (1.727 m)   Wt 150 lb 1.9 oz (68.1 kg)   SpO2 99%   BMI 22.83 kg/m   Physical Exam Vitals reviewed.  Constitutional:      General: He is not in acute distress.    Appearance: He is not diaphoretic.  HENT:     Head: Normocephalic and atraumatic.     Nose: Nose normal.     Mouth/Throat:     Mouth: Mucous membranes are moist.  Eyes:     General: No scleral icterus.    Extraocular Movements: Extraocular movements intact.     Pupils: Pupils are equal, round, and reactive to light.  Cardiovascular:     Rate and Rhythm: Normal rate and regular rhythm.     Pulses: Normal pulses.     Heart sounds: Normal heart sounds. No murmur heard.   Pulmonary:     Breath sounds: Normal breath sounds. No wheezing or rales.  Musculoskeletal:     Cervical back: Neck supple. No tenderness.     Right lower leg: No edema.     Left lower leg: No  edema.  Skin:    General: Skin is warm.     Findings: No rash.  Neurological:     General: No focal deficit present.     Mental Status: He is alert and oriented to person, place, and time.     Sensory: No sensory deficit.     Motor: No weakness.  Psychiatric:        Mood and Affect: Mood normal.        Behavior: Behavior normal.     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[facility])    S/p radiation Follows up with Oncologist        Digestive   GERD (gastroesophageal reflux disease)    On Pantoprazole        Nervous and Auditory   Peripheral neuropathy    Uncontrolled with Gabapentin 200 mg QD Increased to Gabapentin 300 mg qHS      Relevant Medications   gabapentin (NEURONTIN) 300 MG capsule     Musculoskeletal and Integument   Osteoporosis    Was on Reclast infusions Takes Vitamin D and calcium supplements        Genitourinary   Prostate cancer ([place])    S/p radiation therapy, in remission        Other   TOBACCO ABUSE    Smokes about 0.5 pack/day  Asked about quitting: confirms that he currently smokes cigarettes Advise to quit smoking: Educated about QUITTING to reduce the risk of cancer, cardio and cerebrovascular disease. Assess willingness: Unwilling to quit at this time, but is working on cutting back. Assist with counseling and pharmacotherapy: Counseled  for 5 minutes and literature provided. Arrange for follow up: Follow up in 3 months and continue to offer help.      RLS (restless legs syndrome)    Well-controlled with Ropinirole 3 mg qHS      Encounter to establish care - Primary    Care established Previous chart reviewed History and medications reviewed with the patient       Other Visit Diagnoses    Blurry  vision, right eye  No warning signs for now No conjunctival erythema, headache or sudden onset monocular vision loss Advised to follow up with Ophthalmology         Outpatient Encounter Medications as of 05/13/2020  Medication Sig  . aspirin EC 81 MG tablet Take 81 mg by mouth daily.  . calcium carbonate (OS-CAL) 600 MG tablet Take 1 tablet (600 mg total) by mouth 2 (two) times daily with a meal. (Patient taking differently: Take 600 mg by mouth 2 (two) times daily.)  . Cholecalciferol (VITAMIN D) 50 MCG (2000 UT) tablet Take 2,000 Units by mouth daily.  . clopidogrel (PLAVIX) 75 MG tablet TAKE 1 TABLET BY MOUTH ONCE A DAY. (Patient taking differently: Take 75 mg by mouth daily.)  . cyanocobalamin 1000 MCG tablet Take 1,000 mcg by mouth 2 (two) times daily.  . famotidine (PEPCID) 20 MG tablet Take 1 tablet (20 mg total) by mouth at bedtime.  . Ferrous Sulfate (IRON) 325 (65 Fe) MG TABS 1 tablet daily  . HYDROcodone-acetaminophen (NORCO) 10-325 MG tablet TAKE 1 TABLET BY MOUTH EVERY FOUR HOURS AS NEEDED.  . magnesium oxide (MAG-OX) 400 (241.3 Mg) MG tablet Take 1 tablet (400 mg total) by mouth 2 (two) times daily.  . mirtazapine (REMERON) 30 MG tablet TAKE ONE TABLET BY MOUTH ONCE EVERY EVENING AT BEDTIME (Patient taking differently: Take 30 mg by mouth at bedtime.)  . pantoprazole (PROTONIX) 40 MG tablet Take 1 tablet (40 mg total) by mouth daily.  Marland Kitchen rOPINIRole (REQUIP) 3 MG tablet TAKE (1) TABLET BY MOUTH AT BEDTIME.  . simvastatin (ZOCOR) 40 MG tablet Take 1 tablet (40 mg total) by mouth at bedtime.  . [DISCONTINUED] gabapentin (NEURONTIN) 100 MG capsule Take 2 capsules at bedtime for nerve pain  . gabapentin (NEURONTIN) 300 MG capsule Take 1 capsule (300 mg total) by mouth at bedtime.  . [DISCONTINUED] gabapentin (NEURONTIN) 300 MG capsule Take 1 capsule (300 mg total) by mouth at bedtime. Take 2 capsules at bedtime for nerve pain (Patient taking differently: Take 300 mg by mouth at  bedtime.)   No facility-administered encounter medications on file as of 05/13/2020.    Follow-up: Return in about 4 months (around 09/12/2020).   Lindell Spar, MD

## 2020-05-13 NOTE — Assessment & Plan Note (Signed)
Well-controlled with Ropinirole 3 mg qHS

## 2020-05-13 NOTE — Assessment & Plan Note (Signed)
Was on Reclast infusions Takes Vitamin D and calcium supplements

## 2020-05-13 NOTE — Patient Instructions (Signed)
Please start taking Gabapentin 300 mg at nighttime instead of 200 mg.  Please cut down Vitamin D to 2000 once daily instead of twice daily.  Continue to take other medications as prescribed.  Please continue your efforts to cut down smoking.

## 2020-05-13 NOTE — Assessment & Plan Note (Signed)
Smokes about 0.5 pack/day ? ?Asked about quitting: confirms that he currently smokes cigarettes ?Advise to quit smoking: Educated about QUITTING to reduce the risk of cancer, cardio and cerebrovascular disease. ?Assess willingness: Unwilling to quit at this time, but is working on cutting back. ?Assist with counseling and pharmacotherapy: Counseled for 5 minutes and literature provided. ?Arrange for follow up: Follow up in 3 months and continue to offer help. ?

## 2020-05-13 NOTE — Assessment & Plan Note (Signed)
Uncontrolled with Gabapentin 200 mg QD Increased to Gabapentin 300 mg qHS

## 2020-05-13 NOTE — Assessment & Plan Note (Signed)
S/p radiation Follows up with Oncologist

## 2020-05-13 NOTE — Assessment & Plan Note (Signed)
S/p radiation therapy, in remission

## 2020-05-13 NOTE — Assessment & Plan Note (Signed)
On Pantoprazole 

## 2020-06-03 ENCOUNTER — Other Ambulatory Visit: Payer: Self-pay | Admitting: *Deleted

## 2020-06-03 ENCOUNTER — Telehealth: Payer: Self-pay

## 2020-06-03 MED ORDER — CLOPIDOGREL BISULFATE 75 MG PO TABS: 1.0000 | ORAL_TABLET | Freq: Every day | ORAL | 6 refills | Status: DC

## 2020-06-03 NOTE — Telephone Encounter (Signed)
Please advise 

## 2020-06-03 NOTE — Telephone Encounter (Signed)
Patient called he is needing a refill on his Plavix and is curious if he can take the 300 mg gabapentin 3 times daily ph# [phone]

## 2020-06-03 NOTE — Telephone Encounter (Signed)
Plavix sent to pharmacy pt advised not to take gabapentin three times a day only once as prescribed with verbal understanding

## 2020-06-03 NOTE — Telephone Encounter (Signed)
Please advise him to avoid taking it 3 times in a day. Please continue the prescribed dose for now. Okay to refill Plavix.

## 2020-06-12 NOTE — Progress Notes (Deleted)
Chronic Care Management Pharmacy Note  06/12/2020 Name:  Cory Foley MRN:  [id number] DOB:  07-20-1950  Subjective: Cory Foley is an 70 y.o. year old male who is a primary patient of Lindell Spar, MD.  The CCM team was consulted for assistance with disease management and care coordination needs.    Engaged with patient by telephone for follow up visit in response to provider referral for pharmacy case management and/or care coordination services.   Consent to Services:  The patient was given the following information about Chronic Care Management services today, agreed to services, and gave verbal consent: 1. CCM service includes personalized support from designated clinical staff supervised by the primary care provider, including individualized plan of care and coordination with other care providers 2. 24/7 contact phone numbers for assistance for urgent and routine care needs. 3. Service will only be billed when office clinical staff spend 20 minutes or more in a month to coordinate care. 4. Only one practitioner may furnish and bill the service in a calendar month. 5.The patient may stop CCM services at any time (effective at the end of the month) by phone call to the office staff. 6. The patient will be responsible for cost sharing (co-pay) of up to 20% of the service fee (after annual deductible is met). Patient agreed to services and consent obtained.  Patient Care Team: Lindell Spar, MD as PCP - General (Internal Medicine) Bensimhon, Shaune Pascal, MD (Cardiology) Derek Jack, MD as Consulting Physician (Medical Oncology) Donetta Potts, RN as Oncology Nurse Navigator (Oncology) Edythe Clarity, [facility] as Pharmacist (Pharmacist)  Recent office visits: ***  Recent consult visits: [facility] visits: {Hospital DC Yes/No:25215}  Objective:  Lab Results  Component Value Date   CREATININE 1.00 04/08/2020   BUN 12 04/08/2020   GFRNONAA >60 01/22/2020   GFRAA  >60 11/14/2019   NA 139 04/08/2020   K 4.8 04/08/2020   CALCIUM 9.7 04/08/2020   CO2 31 04/08/2020   GLUCOSE 86 04/08/2020    Lab Results  Component Value Date/Time   HGBA1C 5.5 03/13/2016 11:37 AM   HGBA1C 5.7 (H) 09/03/2015 08:40 AM    Last diabetic Eye exam: No results found for: HMDIABEYEEXA  Last diabetic Foot exam: No results found for: HMDIABFOOTEX   Lab Results  Component Value Date   CHOL 185 04/08/2020   HDL 58 04/08/2020   LDLCALC 106 (H) 04/08/2020   TRIG 112 04/08/2020   CHOLHDL 3.2 04/08/2020    Hepatic Function Latest Ref Rng & Units 04/08/2020 01/22/2020 11/14/2019  Total Protein 6.1 - 8.1 g/dL 6.4 6.6 6.3(L)  Albumin 3.5 - 5.0 g/dL - 3.8 3.7  AST 10 - 35 U/L '14 16 17  ' ALT 9 - 46 U/L '11 13 13  ' Alk Phosphatase 38 - 126 U/L - 42 44  Total Bilirubin 0.2 - 1.2 mg/dL 0.3 0.5 0.5    Lab Results  Component Value Date/Time   TSH 1.118 11/14/2019 10:04 AM   TSH 0.527 09/13/2019 09:27 AM   TSH 1.12 07/21/2018 09:15 AM   TSH 0.33 (L) 07/13/2018 10:40 AM   FREET4 1.1 07/21/2018 09:15 AM    CBC Latest Ref Rng & Units 04/08/2020 01/22/2020 11/14/2019  WBC 3.8 - 10.8 Thousand/uL 4.1 4.0 3.5(L)  Hemoglobin 13.2 - 17.1 g/dL 11.9(L) 10.7(L) 10.2(L)  Hematocrit 38.5 - 50.0 % 35.3(L) 33.8(L) 32.2(L)  Platelets 140 - 400 Thousand/uL 208 191 185    Lab Results  Component Value  Date/Time   VD25OH 107.07 (H) 11/14/2019 10:04 AM   VD25OH 95.44 09/13/2019 09:27 AM    Clinical ASCVD: {YES/NO:21197} The 10-year ASCVD risk score Mikey Bussing DC Jr., et al., 2013) is: 17.6%   Values used to calculate the score:     Age: 45 years     Sex: Male     Is Non-Hispanic African American: No     Diabetic: No     Tobacco smoker: Yes     Systolic Blood Pressure: 003 mmHg     Is BP treated: No     HDL Cholesterol: 58 mg/dL     Total Cholesterol: 185 mg/dL    Depression screen [facility] 2/9 05/13/2020 10/27/2019 06/26/2019  Decreased Interest 0 0 1  Down, Depressed, Hopeless 0 0 0  PHQ - 2  Score 0 0 1  Altered sleeping 3 0 0  Tired, decreased energy 0 0 0  Change in appetite 0 0 0  Feeling bad or failure about yourself  0 0 0  Trouble concentrating 0 0 0  Moving slowly or fidgety/restless 0 0 0  Suicidal thoughts 0 0 0  PHQ-9 Score 3 0 1  Difficult doing work/chores Somewhat difficult Not difficult at all Not difficult at all  Some recent data might be hidden     ***Other: (CHADS2VASc if Afib, MMRC or CAT for COPD, ACT, DEXA)  Social History   Tobacco Use  Smoking Status Current Every Day Smoker  . Packs/day: 0.50  . Years: 43.00  . Pack years: 21.50  . Types: Cigarettes  Smokeless Tobacco Never Used  Tobacco Comment   1/2 ppd   BP Readings from Last 3 Encounters:  05/13/20 119/71  04/08/20 114/80  03/06/20 131/83   Pulse Readings from Last 3 Encounters:  05/13/20 87  04/08/20 60  03/06/20 (!) 50   Wt Readings from Last 3 Encounters:  05/13/20 150 lb 1.9 oz (68.1 kg)  04/08/20 144 lb (65.3 kg)  02/27/20 135 lb (61.2 kg)   BMI Readings from Last 3 Encounters:  05/13/20 22.83 kg/m  04/08/20 21.90 kg/m  02/27/20 20.53 kg/m    Assessment/Interventions: Review of patient past medical history, allergies, medications, health status, including review of consultants reports, laboratory and other test data, was performed as part of comprehensive evaluation and provision of chronic care management services.   SDOH:  (Social Determinants of Health) assessments and interventions performed: {yes/no:20286}  SDOH Screenings   Alcohol Screen: Low Risk   . Last Alcohol Screening Score (AUDIT): 0  Depression (PHQ2-9): Low Risk   . PHQ-2 Score: 3  Financial Resource Strain: Not on file  Food Insecurity: Not on file  Housing: Not on file  Physical Activity: Not on file  Social Connections: Not on file  Stress: Not on file  Tobacco Use: High Risk  . Smoking Tobacco Use: Current Every Day Smoker  . Smokeless Tobacco Use: Never Used  Transportation Needs:  Not on file    CCM Care Plan  Allergies  Allergen Reactions  . Codeine Other (See Comments)    Bad headache and sweats  . Morphine And Related Itching    sweats  . Percocet [Oxycodone-Acetaminophen] Other (See Comments)    Headache and sweats    Medications Reviewed Today    Reviewed by Shelda Altes, CMA (Certified Medical Assistant) on 05/13/20 at [place] List Status: <None>  Medication Order Taking? Sig Documenting Provider Last Dose Status Informant  aspirin EC 81 MG tablet [id number]  Take 81  mg by mouth daily. [provider]  Active Self  calcium carbonate (OS-CAL) 600 MG tablet [id number]  Take 1 tablet (600 mg total) by mouth 2 (two) times daily with a meal.  Patient taking differently: Take 600 mg by mouth 2 (two) times daily.   [place], Modena Nunnery, MD  Active   Cholecalciferol (VITAMIN D) 50 MCG (2000 UT) tablet [id number]  Take 2,000 Units by mouth in the morning and at bedtime. [provider]  Active Self  clopidogrel (PLAVIX) 75 MG tablet [id number]  TAKE 1 TABLET BY MOUTH ONCE A DAY.  Patient taking differently: Take 75 mg by mouth daily.   Waynetta Sandy, MD  Active Self  cyanocobalamin 1000 MCG tablet [id number]  Take 1,000 mcg by mouth 2 (two) times daily. [provider]  Active Self  famotidine (PEPCID) 20 MG tablet [id number]  Take 1 tablet (20 mg total) by mouth at bedtime. Alycia Rossetti, MD  Active   Ferrous Sulfate (IRON) 325 (65 Fe) MG TABS [id number]  1 tablet daily [place], Modena Nunnery, MD  Active   gabapentin (NEURONTIN) 100 MG capsule [id number]  Take 2 capsules at bedtime for nerve pain Alycia Rossetti, MD  Active   HYDROcodone-acetaminophen [facility]) 10-325 MG tablet [id number]  TAKE 1 TABLET BY MOUTH EVERY FOUR HOURS AS NEEDED. [place], Modena Nunnery, MD  Active   magnesium oxide (MAG-OX) 400 (241.3 Mg) MG tablet [id number]  Take 1 tablet (400 mg total) by mouth 2 (two) times daily. Derek Jack, MD  Active Self   mirtazapine (REMERON) 30 MG tablet [id number]  TAKE ONE TABLET BY MOUTH ONCE EVERY EVENING AT BEDTIME  Patient taking differently: Take 30 mg by mouth at bedtime.   [place], Modena Nunnery, MD  Active Self  pantoprazole (PROTONIX) 40 MG tablet [id number]  Take 1 tablet (40 mg total) by mouth daily. [place], Modena Nunnery, MD  Active   rOPINIRole (REQUIP) 3 MG tablet [id number]  TAKE (1) TABLET BY MOUTH AT BEDTIME. , Modena Nunnery, MD  Active   simvastatin (ZOCOR) 40 MG tablet [id number]  Take 1 tablet (40 mg total) by mouth at bedtime. Alycia Rossetti, MD  Active           Patient Active Problem List   Diagnosis Date Noted  . Encounter to establish care 05/13/2020  . Peripheral neuropathy 05/13/2020  . Radiation dermatitis 05/29/2019  . Dehydration 05/09/2019  . Port-A-Cath in place 04/28/2019  . 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 Metastatic squamous cell carcinoma ([place]) 03/20/2019  . Migraines 12/26/2018  . GERD (gastroesophageal reflux disease) 04/08/2018  . Osteoporosis 05/01/2015  . DDD (degenerative disc disease), lumbar 12/10/2013  . Aftercare following surgery of the circulatory system, [place] 06/26/2013  . Pain in limb-Bilateral foot 06/26/2013  . Insomnia 06/07/2013  . COPD (chronic obstructive pulmonary disease) ([place]) 12/07/2012  . Back pain 12/07/2012  . RLS (restless legs syndrome) 12/07/2012  . Adrenal adenoma 10/09/2012  . Prostate cancer ([place]) 01/11/2012  . Coccyxdynia 11/11/2011  . PVD (peripheral vascular disease) ([place]) 10/09/2010  . Hyperlipidemia 07/20/2008  . TOBACCO ABUSE 07/20/2008  . Osteoarthritis 07/20/2008    Immunization History  Administered Date(s) Administered  . Fluad Quad(high Dose 65+) 12/26/2018, 10/27/2019  . Influenza, High Dose Seasonal PF 01/25/2018  . Influenza,inj,Quad PF,6+ Mos 11/11/2015, 11/16/2016  . Pneumococcal Conjugate-13 07/14/2016  . Pneumococcal Polysaccharide-23 06/07/2013  . Td 02/23/2005  . Zoster  Recombinat (Shingrix) 07/14/2016    Conditions to be addressed/monitored:  {USCCMDZASSESSMENTOPTIONS:23563}  There are no care plans that you recently modified to display for this patient.    Medication Assistance: {MEDASSISTANCEINFO:25044}  Patient's preferred pharmacy is:  [place], [address] [place] [address] Phone: [phone] Fax: [phone]  Uses pill box? {Yes or If no, why not?:20788} Pt endorses ***% compliance  We discussed: {Pharmacy options:24294} Patient decided to: {US Pharmacy Plan:23885}  Care Plan and Follow Up Patient Decision:  {FOLLOWUP:24991}  Plan: {CM FOLLOW UP PLAN:25073}  ***

## 2020-06-17 ENCOUNTER — Telehealth: Payer: Self-pay

## 2020-06-18 ENCOUNTER — Telehealth: Payer: Self-pay

## 2020-07-25 ENCOUNTER — Other Ambulatory Visit: Payer: Self-pay

## 2020-07-25 ENCOUNTER — Ambulatory Visit (HOSPITAL_COMMUNITY)
Admission: RE | Admit: 2020-07-25 | Discharge: 2020-07-25 | Disposition: A | Discharge status: Home / Self Care | Payer: Medicare Other | Source: Ambulatory Visit | Attending: Hematology | Admitting: Hematology

## 2020-07-25 ENCOUNTER — Inpatient Hospital Stay (HOSPITAL_COMMUNITY): Payer: Medicare Other | Attending: Hematology

## 2020-07-25 DIAGNOSIS — Z9221 Personal history of antineoplastic chemotherapy: Secondary | ICD-10-CM | POA: Insufficient documentation

## 2020-07-25 DIAGNOSIS — Z85818 Personal history of malignant neoplasm of other sites of lip, oral cavity, and pharynx: Secondary | ICD-10-CM | POA: Diagnosis not present

## 2020-07-25 DIAGNOSIS — Z79899 Other long term (current) drug therapy: Secondary | ICD-10-CM | POA: Insufficient documentation

## 2020-07-25 DIAGNOSIS — D509 Iron deficiency anemia, unspecified: Secondary | ICD-10-CM | POA: Insufficient documentation

## 2020-07-25 DIAGNOSIS — Z800 Family history of malignant neoplasm of digestive organs: Secondary | ICD-10-CM | POA: Diagnosis not present

## 2020-07-25 DIAGNOSIS — Z8249 Family history of ischemic heart disease and other diseases of the circulatory system: Secondary | ICD-10-CM | POA: Diagnosis not present

## 2020-07-25 DIAGNOSIS — F17210 Nicotine dependence, cigarettes, uncomplicated: Secondary | ICD-10-CM | POA: Diagnosis not present

## 2020-07-25 DIAGNOSIS — Z8546 Personal history of malignant neoplasm of prostate: Secondary | ICD-10-CM | POA: Diagnosis not present

## 2020-07-25 DIAGNOSIS — C099 Malignant neoplasm of tonsil, unspecified: Secondary | ICD-10-CM | POA: Insufficient documentation

## 2020-07-25 DIAGNOSIS — Z83438 Family history of other disorder of lipoprotein metabolism and other lipidemia: Secondary | ICD-10-CM | POA: Diagnosis not present

## 2020-07-25 DIAGNOSIS — Z833 Family history of diabetes mellitus: Secondary | ICD-10-CM | POA: Insufficient documentation

## 2020-07-25 DIAGNOSIS — J387 Other diseases of larynx: Secondary | ICD-10-CM | POA: Diagnosis not present

## 2020-07-25 DIAGNOSIS — Z923 Personal history of irradiation: Secondary | ICD-10-CM | POA: Diagnosis not present

## 2020-07-25 DIAGNOSIS — Z809 Family history of malignant neoplasm, unspecified: Secondary | ICD-10-CM | POA: Diagnosis not present

## 2020-07-25 DIAGNOSIS — C760 Malignant neoplasm of head, face and neck: Secondary | ICD-10-CM | POA: Diagnosis not present

## 2020-07-25 DIAGNOSIS — Z981 Arthrodesis status: Secondary | ICD-10-CM | POA: Diagnosis not present

## 2020-07-25 LAB — TSH: TSH: 1.982 u[IU]/mL (ref 0.350–4.500)

## 2020-07-25 LAB — CBC WITH DIFFERENTIAL/PLATELET
Abs Immature Granulocytes: 0.02 10*3/uL (ref 0.00–0.07)
Basophils Absolute: 0 10*3/uL (ref 0.0–0.1)
Basophils Relative: 0 %
Eosinophils Absolute: 0.1 10*3/uL (ref 0.0–0.5)
Eosinophils Relative: 3 %
HCT: 38.4 % — ABNORMAL LOW (ref 39.0–52.0)
Hemoglobin: 12.1 g/dL — ABNORMAL LOW (ref 13.0–17.0)
Immature Granulocytes: 1 %
Lymphocytes Relative: 27 %
Lymphs Abs: 1 10*3/uL (ref 0.7–4.0)
MCH: 33.9 pg (ref 26.0–34.0)
MCHC: 31.5 g/dL (ref 30.0–36.0)
MCV: 107.6 fL — ABNORMAL HIGH (ref 80.0–100.0)
Monocytes Absolute: 0.4 10*3/uL (ref 0.1–1.0)
Monocytes Relative: 12 %
Neutro Abs: 2.1 10*3/uL (ref 1.7–7.7)
Neutrophils Relative %: 57 %
Platelets: 166 10*3/uL (ref 150–400)
RBC: 3.57 MIL/uL — ABNORMAL LOW (ref 4.22–5.81)
RDW: 14.4 % (ref 11.5–15.5)
WBC: 3.7 10*3/uL — ABNORMAL LOW (ref 4.0–10.5)
nRBC: 0 % (ref 0.0–0.2)

## 2020-07-25 LAB — COMPREHENSIVE METABOLIC PANEL
ALT: 12 U/L (ref 0–44)
AST: 15 U/L (ref 15–41)
Albumin: 3.8 g/dL (ref 3.5–5.0)
Alkaline Phosphatase: 51 U/L (ref 38–126)
Anion gap: 6 (ref 5–15)
BUN: 12 mg/dL (ref 8–23)
CO2: 29 mmol/L (ref 22–32)
Calcium: 9.1 mg/dL (ref 8.9–10.3)
Chloride: 103 mmol/L (ref 98–111)
Creatinine, Ser: 1.11 mg/dL (ref 0.61–1.24)
GFR, Estimated: >60 mL/min (ref >60)
Glucose, Bld: 97 mg/dL (ref 70–99)
Potassium: 4.5 mmol/L (ref 3.5–5.1)
Sodium: 138 mmol/L (ref 135–145)
Total Bilirubin: 0.4 mg/dL (ref 0.3–1.2)
Total Protein: 6.6 g/dL (ref 6.5–8.1)

## 2020-07-25 MED ORDER — IOHEXOL 300 MG/ML  SOLN
75.0000 mL | Freq: Once | INTRAMUSCULAR | Status: AC | PRN
  Administered 2020-07-25: 75 mL via INTRAVENOUS

## 2020-07-31 NOTE — Progress Notes (Signed)
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  PRIOR THERAPY: Cisplatin x 6 cycles from 05/08/2019 to 06/22/2019  NGS Results: not done  CURRENT THERAPY: surveillance  BRIEF ONCOLOGIC HISTORY:  Oncology History  Malignant neoplasm of palatine tonsil ([place])  04/07/2019 Initial Diagnosis   Tonsillar cancer ([place])   05/08/2019 -  Chemotherapy   The patient had palonosetron (ALOXI) injection 0.25 mg, 0.25 mg, Intravenous,  Once, 6 of 7 cycles Administration: 0.25 mg (05/08/2019), 0.25 mg (05/15/2019), 0.25 mg (05/22/2019), 0.25 mg (05/29/2019), 0.25 mg (06/15/2019), 0.25 mg (06/22/2019) CISplatin (PLATINOL) 77 mg in sodium chloride 0.9 % 250 mL chemo infusion, 40 mg/m2 = 77 mg, Intravenous,  Once, 6 of 7 cycles Dose modification: 32 mg/m2 (80 % of original dose 40 mg/m2, Cycle 5, Reason: Other (see comments), Comment: cytopenia) Administration: 77 mg (05/08/2019), 77 mg (05/15/2019), 77 mg (05/22/2019), 77 mg (05/29/2019), 62 mg (06/15/2019), 77 mg (06/22/2019) fosaprepitant (EMEND) 150 mg in sodium chloride 0.9 % 145 mL IVPB, 150 mg, Intravenous,  Once, 6 of 7 cycles Administration: 150 mg (05/08/2019), 150 mg (05/15/2019), 150 mg (05/22/2019), 150 mg (05/29/2019), 150 mg (06/15/2019), 150 mg (06/22/2019)  for chemotherapy treatment.      CANCER STAGING: Cancer Staging Malignant neoplasm of palatine tonsil ([place]) Staging form: Pharynx - HPV-Mediated Oropharynx, AJCC 8th Edition - Clinical stage from 04/07/2019: Stage I (cT2, cN1, cM0, p16+) - Unsigned   INTERVAL HISTORY:  [name], a 70 y.o. male, returns for routine follow-up of his left tonsillar squamous cell carcinoma. Cory Foley was last seen on 01/25/2020.   Today he reports feeling well. He denies trouble swallowing and any  recent fevers or infections. He reports pain across his pelvic region. He had prostate cancer which was treated with radiation therapy and achieved remission. He is occasionally drinking Ensure and has gained weight. He reports increase in energy levels, but complains of sleep disturbance.   REVIEW OF SYSTEMS:  Review of Systems  Constitutional:  Positive for appetite change (70%) and fatigue (60%). Negative for fever.  HENT:   Negative for trouble swallowing.   Genitourinary:  Positive for difficulty urinating (frequency and decreased flow) and frequency.   Musculoskeletal:  Positive for arthralgias (7/10 pelvis).  Psychiatric/Behavioral:  Positive for sleep disturbance.   All other systems reviewed and are negative.  PAST MEDICAL/SURGICAL HISTORY:  Past Medical History:  Diagnosis Date   Allergy    Anxiety    Arthritis    BPH with obstruction/lower urinary tract symptoms    COPD, mild (HCC)    current smoker   ED (erectile dysfunction)    GERD (gastroesophageal reflux disease)    Hyperlipidemia    Nocturia    Osteoporosis    PAD (peripheral artery disease) ([place])    Port-A-Cath in place 04/28/2019   Right   Prostate cancer ([place]) 01/11/2012   Adenocarcinoma, Gleason=4+4=8, & 4+5=9,PSA=17.24, Volume= 15.45cc   PVD (peripheral vascular disease) ([place])    Restless leg syndrome    Tongue mass    SCC   Past Surgical History:  Procedure Laterality Date   ABDOMINAL AORTOGRAM W/LOWER EXTREMITY N/A 08/16/2018   Procedure: ABDOMINAL AORTOGRAM W/LOWER EXTREMITY;  Surgeon: Serafina Mitchell, MD;  Location: [facility];  Service: Cardiovascular;  Laterality: N/A;  Bilateral  COLONOSCOPY N/A 09/28/2012   Procedure: COLONOSCOPY;  Surgeon: Danie Binder, MD;  [facility];  Service: Endoscopy;  Laterality: N/A;  8:30   DIRECT LARYNGOSCOPY N/A 04/03/2019   Procedure: DIRECT LARYNGOSCOPY;  Surgeon: Leta Baptist, MD;  Location: [facility];  Service: ENT;  Laterality:  N/A;   ESOPHAGOGASTRODUODENOSCOPY N/A 09/28/2012   Procedure: ESOPHAGOGASTRODUODENOSCOPY (EGD);  Surgeon: Danie Binder, MD;  [facility];  Service: Endoscopy;  Laterality: N/A;   Gold seed implatation  04/28/2012   HERNIA REPAIR     Bilateral inguinal X2   IR IMAGING GUIDED PORT INSERTION  05/03/2019   Right   JOINT REPLACEMENT     bilateral   KNEE SURGERY     Left Knee X 2   and Right knee X1   MALONEY DILATION N/A 09/28/2012   Procedure: MALONEY DILATION;  Surgeon: Danie Binder, MD;  [facility];  Service: Endoscopy;  Laterality: N/A;   NOSE SURGERY     PERIPHERAL VASCULAR INTERVENTION  08/16/2018   Procedure: PERIPHERAL VASCULAR INTERVENTION;  Surgeon: Serafina Mitchell, MD;  Location: [facility];  Service: Cardiovascular;;  LT Iliac   PORT-A-CATH REMOVAL Right 03/06/2020   Procedure: MINOR REMOVAL PORT-A-CATH;  Surgeon: [name], MD;  Location: [facility];  Service: General;  Laterality: Right;   PR VEIN BYPASS GRAFT,AORTO-FEM-POP  10/21/10   Left AK to BK popliteal BPG   PROSTATE BIOPSY  01/11/2012   Adenocarcinoma   PROSTATE SURGERY  2015   Chemo and  Radiation   SAVORY DILATION N/A 09/28/2012   Procedure: SAVORY DILATION;  Surgeon: Danie Binder, MD;  [facility];  Service: Endoscopy;  Laterality: N/A;   throat biopsy  03/20/2019   TONSILLECTOMY Left 04/03/2019   Procedure: TONSILLECTOMY;  Surgeon: Leta Baptist, MD;  Location: [facility];  Service: ENT;  Laterality: Left;    SOCIAL HISTORY:  Social History   Socioeconomic History   Marital status: Married    Spouse name: Glenda   Number of children: 2   Years of education: [employer] education level: Not on file  Occupational History    Employer: [employer]    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   Caffeine use: very little   Social Determinants of Radio broadcast assistant Strain: Not on file  Food Insecurity: Not on file  Transportation Needs: Not on file  Physical Activity: Not on file  Stress: Not on file  Social Connections: Not on file  Intimate Partner Violence: Not on file    FAMILY HISTORY:  Family History  Problem Relation Age of Onset   Heart disease Mother        Valve regurgitation and Pacemaker    Diabetes Mother    Hyperlipidemia Mother  Heart disease Father        CABG x 5   Hyperlipidemia Father    Hypertension Father    Heart attack Father    Heart disease Sister        aortic valve replacement   Cancer Sister 108       Colon cancer w/ metastasis   Throat cancer Sister    Hypertension Brother    Heart disease Maternal Grandmother    Heart disease Maternal Grandfather    Heart disease Paternal Grandmother    Cancer Paternal Grandfather    Healthy Son    Healthy Daughter     CURRENT MEDICATIONS:  Current Outpatient Medications  Medication Sig Dispense Refill   aspirin EC 81 MG tablet Take 81 mg by mouth daily.     calcium carbonate (OS-CAL) 600 MG tablet Take 1 tablet (600 mg total) by mouth 2 (two) times daily with a meal. (Patient taking differently: Take 600 mg by mouth 2 (two) times daily.) 30 tablet    Cholecalciferol (VITAMIN D) 50 MCG (2000 UT) tablet Take 2,000 Units by mouth daily.     clopidogrel (PLAVIX) 75 MG tablet Take 1 tablet (75 mg total) by mouth daily. 30 tablet 6   cyanocobalamin 1000 MCG tablet Take 1,000 mcg by mouth 2 (two) times daily.     famotidine (PEPCID) 20 MG tablet Take 1  tablet (20 mg total) by mouth at bedtime. 90 tablet 1   Ferrous Sulfate (IRON) 325 (65 Fe) MG TABS 1 tablet daily 30 tablet 0   gabapentin (NEURONTIN) 300 MG capsule Take 1 capsule (300 mg total) by mouth at bedtime. 30 capsule 2   HYDROcodone-acetaminophen (NORCO) 10-325 MG tablet TAKE 1 TABLET BY MOUTH EVERY FOUR HOURS AS NEEDED. 120 tablet 0   magnesium oxide (MAG-OX) 400 (241.3 Mg) MG tablet Take 1 tablet (400 mg total) by mouth 2 (two) times daily. 60 tablet 1   mirtazapine (REMERON) 30 MG tablet TAKE ONE TABLET BY MOUTH ONCE EVERY EVENING AT BEDTIME (Patient taking differently: Take 30 mg by mouth at bedtime.) 90 tablet 2   pantoprazole (PROTONIX) 40 MG tablet Take 1 tablet (40 mg total) by mouth daily. 90 tablet 1   rOPINIRole (REQUIP) 3 MG tablet TAKE (1) TABLET BY MOUTH AT BEDTIME. 90 tablet 2   simvastatin (ZOCOR) 40 MG tablet Take 1 tablet (40 mg total) by mouth at bedtime. 90 tablet 1   No current facility-administered medications for this visit.    ALLERGIES:  Allergies  Allergen Reactions   Codeine Other (See Comments)    Bad headache and sweats   Morphine And Related Itching    sweats   Percocet [Oxycodone-Acetaminophen] Other (See Comments)    Headache and sweats    PHYSICAL EXAM:  Performance status (ECOG): 1 - Symptomatic but completely ambulatory  There were no vitals filed for this visit. Wt Readings from Last 3 Encounters:  05/13/20 150 lb 1.9 oz (68.1 kg)  04/08/20 144 lb (65.3 kg)  02/27/20 135 lb (61.2 kg)   Physical Exam Vitals reviewed.  Constitutional:      Appearance: Normal appearance.  HENT:     Mouth/Throat:     Mouth: Mucous membranes are moist. No oral lesions.     Dentition: No gum lesions.     Tongue: No lesions.  Cardiovascular:     Rate and Rhythm: Normal rate and regular rhythm.     Pulses: Normal pulses.     Heart sounds: Normal heart sounds.  Pulmonary:     Effort: Pulmonary effort is normal.     Breath sounds: Normal breath  sounds.  Chest:  Breasts:    Right: No supraclavicular adenopathy.     Left: No supraclavicular adenopathy.  Lymphadenopathy:     Upper Body:     Right upper body: No supraclavicular adenopathy.     Left upper body: No supraclavicular adenopathy.  Neurological:     General: No focal deficit present.     Mental Status: He is alert and oriented to person, place, and time.  Psychiatric:        Mood and Affect: Mood normal.        Behavior: Behavior normal.     LABORATORY DATA:  I have reviewed the labs as listed.  CBC Latest Ref Rng & Units 07/25/2020 04/08/2020 01/22/2020  WBC 4.0 - 10.5 K/uL 3.7(L) 4.1 4.0  Hemoglobin 13.0 - 17.0 g/dL 12.1(L) 11.9(L) 10.7(L)  Hematocrit 39.0 - 52.0 % 38.4(L) 35.3(L) 33.8(L)  Platelets 150 - 400 K/uL 166 208 191   CMP Latest Ref Rng & Units 07/25/2020 04/08/2020 01/22/2020  Glucose 70 - 99 mg/dL 97 86 118(H)  BUN 8 - 23 mg/dL 12 12 15   Creatinine 0.61 - 1.24 mg/dL 1.11 1.00 1.09  Sodium 135 - 145 mmol/L 138 139 136  Potassium 3.5 - 5.1 mmol/L 4.5 4.8 3.8  Chloride 98 - 111 mmol/L 103 104 101  CO2 22 - 32 mmol/L 29 31 27   Calcium 8.9 - 10.3 mg/dL 9.1 9.7 9.1  Total Protein 6.5 - 8.1 g/dL 6.6 6.4 6.6  Total Bilirubin 0.3 - 1.2 mg/dL 0.4 0.3 0.5  Alkaline Phos 38 - 126 U/L 51 - 42  AST 15 - 41 U/L 15 14 16   ALT 0 - 44 U/L 12 11 13     DIAGNOSTIC IMAGING:  I have independently reviewed the scans and discussed with the patient. CT SOFT TISSUE NECK W CONTRAST  Result Date: 07/25/2020 CLINICAL DATA:  Head neck cancer surveillance. Follow-up left tonsil squamous cell carcinoma EXAM: CT NECK WITH CONTRAST TECHNIQUE: Multidetector CT imaging of the neck was performed using the standard protocol following the bolus administration of intravenous contrast. CONTRAST:  49mL OMNIPAQUE IOHEXOL 300 MG/ML  SOLN COMPARISON:  01/22/2020 FINDINGS: Pharynx and larynx: Regression of the asymmetric thickening at the left aryepiglottic fold on prior. No asymmetric  enhancement or soft tissue mass. Salivary glands: No inflammation, mass, or stone. Thyroid: Normal. Lymph nodes: None enlarged or abnormal density. Vascular: Atheromatous plaque. Limited intracranial: Negative Visualized orbits: Negative Mastoids and visualized paranasal sinuses: Clear Skeleton: Advanced generalized cervical spine degeneration with C6-7 solid arthrodesis. No evidence of fracture or bone lesion Upper chest: Negative IMPRESSION: Benign neck CT.  No evidence of recurrent disease. Electronically Signed   By: Monte Fantasia M.D.   On: 07/25/2020 11:47     ASSESSMENT:  1.  T2N1 squamous cell carcinoma the left tonsil, p16 positive: -PET scan on 03/27/2019 shows uptake in the left tonsillar fossa, 1.5 cm lymph node at the level 2 on the left, no other metastatic disease. -Chemoradiation therapy with cisplatin weekly from 05/08/2019 through 06/26/2019.   2.  Prostate cancer: -Gleason 9 prostate cancer, diagnosed in 2013, status post XRT from April 2014 through May 2014 with 2 years of Lupron.  PSA is normal.  PLAN:  1.  Left tonsillar squamous cell carcinoma: - Physical examination today did not reveal any oropharyngeal masses.  No palpable neck adenopathy. - Reviewed labs which showed normal TSH  of 1.9. - CT soft tissue neck on 07/25/2020 was negative for any evidence of recurrence. - He is reportedly not following up with Dr. Benjamine Mola.  I have encouraged him to follow-up. - He is reportedly having problems with urination and occasional suprapubic pain.  He was asked to follow-up with Dr. Alyson Ingles due to his history of prostate cancer.  His last PSA was in December 2020. - RTC 6 months with scans and labs.   2.  Nutrition/weight loss: - He is eating well and continues to gain weight.  Not requiring any nutritional supplements.   3.  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 - Latest labs show creatinine improved to 1.11.    4.  Normocytic anemia: - Hemoglobin has improved to 12.1 today.   Orders placed this encounter:  No orders of the defined types were placed in this encounter.    Derek Jack, MD [facility] [phone]   I, Thana Ates, am acting as a scribe for Dr. Derek Jack.  I, Derek Jack MD, have reviewed the above documentation for accuracy and completeness, and I agree with the above.

## 2020-08-01 ENCOUNTER — Inpatient Hospital Stay (HOSPITAL_BASED_OUTPATIENT_CLINIC_OR_DEPARTMENT_OTHER): Payer: Medicare Other | Admitting: Hematology

## 2020-08-01 VITALS — BP 100/65 | HR 69 | Temp 98.4°F | Resp 16 | Wt 150.4 lb

## 2020-08-01 DIAGNOSIS — Z833 Family history of diabetes mellitus: Secondary | ICD-10-CM | POA: Diagnosis not present

## 2020-08-01 DIAGNOSIS — Z923 Personal history of irradiation: Secondary | ICD-10-CM | POA: Diagnosis not present

## 2020-08-01 DIAGNOSIS — Z8249 Family history of ischemic heart disease and other diseases of the circulatory system: Secondary | ICD-10-CM | POA: Diagnosis not present

## 2020-08-01 DIAGNOSIS — Z79899 Other long term (current) drug therapy: Secondary | ICD-10-CM | POA: Diagnosis not present

## 2020-08-01 DIAGNOSIS — Z85818 Personal history of malignant neoplasm of other sites of lip, oral cavity, and pharynx: Secondary | ICD-10-CM | POA: Diagnosis not present

## 2020-08-01 DIAGNOSIS — F17210 Nicotine dependence, cigarettes, uncomplicated: Secondary | ICD-10-CM | POA: Diagnosis not present

## 2020-08-01 DIAGNOSIS — Z809 Family history of malignant neoplasm, unspecified: Secondary | ICD-10-CM | POA: Diagnosis not present

## 2020-08-01 DIAGNOSIS — Z9221 Personal history of antineoplastic chemotherapy: Secondary | ICD-10-CM | POA: Diagnosis not present

## 2020-08-01 DIAGNOSIS — Z800 Family history of malignant neoplasm of digestive organs: Secondary | ICD-10-CM | POA: Diagnosis not present

## 2020-08-01 DIAGNOSIS — Z83438 Family history of other disorder of lipoprotein metabolism and other lipidemia: Secondary | ICD-10-CM | POA: Diagnosis not present

## 2020-08-01 DIAGNOSIS — C099 Malignant neoplasm of tonsil, unspecified: Secondary | ICD-10-CM | POA: Diagnosis not present

## 2020-08-01 DIAGNOSIS — D509 Iron deficiency anemia, unspecified: Secondary | ICD-10-CM | POA: Diagnosis not present

## 2020-08-01 NOTE — Patient Instructions (Signed)
[place] at [facility] Discharge Instructions  You were seen today by Dr. Delton Coombes. You will be scheduled for a CT scan of your neck prior to your next visit. He went over your recent results. Dr. Delton Coombes will see you back in 6 months for labs and follow up.   Thank you for choosing [facility] at [facility] to provide your oncology and hematology care.  To afford each patient quality time with our provider, please arrive at least 15 minutes before your scheduled appointment time.   If you have a lab appointment with the [facility] please come in thru the Main Entrance and check in at the main information desk  You need to re-schedule your appointment should you arrive 10 or more minutes late.  We strive to give you quality time with our providers, and arriving late affects you and other patients whose appointments are after yours.  Also, if you no show three or more times for appointments you may be dismissed from the clinic at the providers discretion.     Again, thank you for choosing [facility].  Our hope is that these requests will decrease the amount of time that you wait before being seen by our physicians.       _____________________________________________________________  Should you have questions after your visit to [facility], please contact our office at [phone] between the hours of 8:00 a.m. and 4:30 p.m.  Voicemails left after 4:00 p.m. will not be returned until the following business day.  For prescription refill requests, have your pharmacy contact our office and allow 72 hours.    Cancer Center Support Programs:   > Cancer Support Group  2nd Tuesday of the month 1pm-2pm, Journey Room

## 2020-08-02 ENCOUNTER — Other Ambulatory Visit: Payer: Self-pay | Admitting: Internal Medicine

## 2020-08-02 ENCOUNTER — Other Ambulatory Visit: Payer: Self-pay | Admitting: Vascular Surgery

## 2020-08-02 NOTE — Telephone Encounter (Signed)
Send to [facility]

## 2020-08-02 NOTE — Telephone Encounter (Signed)
Needs to be filled by [facility]

## 2020-08-12 ENCOUNTER — Telehealth: Payer: Self-pay

## 2020-08-12 ENCOUNTER — Other Ambulatory Visit: Payer: Self-pay | Admitting: *Deleted

## 2020-08-12 DIAGNOSIS — G6289 Other specified polyneuropathies: Secondary | ICD-10-CM

## 2020-08-12 MED ORDER — GABAPENTIN 300 MG PO CAPS: 300.0000 mg | ORAL_CAPSULE | Freq: Every day | ORAL | 2 refills | Status: DC

## 2020-08-12 MED ORDER — SIMVASTATIN 40 MG PO TABS: 40.0000 mg | ORAL_TABLET | Freq: Every day | ORAL | 1 refills | Status: DC

## 2020-08-12 NOTE — Telephone Encounter (Signed)
This medication has been sent to pt pharmacy  

## 2020-08-12 NOTE — Telephone Encounter (Signed)
Simvastatin & Gabapentin  please send to [facility]

## 2020-09-03 ENCOUNTER — Other Ambulatory Visit: Payer: Self-pay | Admitting: Internal Medicine

## 2020-09-10 ENCOUNTER — Ambulatory Visit: Payer: Medicare Other | Admitting: Internal Medicine

## 2020-10-02 ENCOUNTER — Telehealth: Payer: Self-pay

## 2020-10-02 ENCOUNTER — Other Ambulatory Visit: Payer: Self-pay | Admitting: *Deleted

## 2020-10-02 MED ORDER — FAMOTIDINE 20 MG PO TABS: 20.0000 mg | ORAL_TABLET | Freq: Every day | ORAL | 1 refills | Status: AC

## 2020-10-02 MED ORDER — PANTOPRAZOLE SODIUM 40 MG PO TBEC: 40.0000 mg | DELAYED_RELEASE_TABLET | Freq: Every day | ORAL | 1 refills | Status: DC

## 2020-10-02 NOTE — Telephone Encounter (Signed)
Patient need med refill  Famotidine 20 mg  Pantoprazole  40 mg   Pharmacy: [facility]

## 2020-10-02 NOTE — Telephone Encounter (Signed)
Pt medication sent to pharmacy  

## 2020-10-03 ENCOUNTER — Other Ambulatory Visit: Payer: Self-pay | Admitting: Internal Medicine

## 2020-10-11 ENCOUNTER — Other Ambulatory Visit: Payer: Self-pay | Admitting: Internal Medicine

## 2020-10-11 DIAGNOSIS — G6289 Other specified polyneuropathies: Secondary | ICD-10-CM

## 2020-10-15 ENCOUNTER — Other Ambulatory Visit: Payer: Self-pay

## 2020-10-15 ENCOUNTER — Ambulatory Visit (INDEPENDENT_AMBULATORY_CARE_PROVIDER_SITE_OTHER): Payer: Medicare Other | Admitting: Internal Medicine

## 2020-10-15 ENCOUNTER — Encounter: Payer: Self-pay | Admitting: Internal Medicine

## 2020-10-15 VITALS — BP 125/74 | HR 62 | Temp 98.4°F | Resp 20 | Ht 68.0 in | Wt 162.0 lb

## 2020-10-15 DIAGNOSIS — M5136 Other intervertebral disc degeneration, lumbar region: Secondary | ICD-10-CM

## 2020-10-15 DIAGNOSIS — Z23 Encounter for immunization: Secondary | ICD-10-CM

## 2020-10-15 DIAGNOSIS — G6289 Other specified polyneuropathies: Secondary | ICD-10-CM | POA: Diagnosis not present

## 2020-10-15 DIAGNOSIS — K219 Gastro-esophageal reflux disease without esophagitis: Secondary | ICD-10-CM

## 2020-10-15 DIAGNOSIS — E782 Mixed hyperlipidemia: Secondary | ICD-10-CM | POA: Diagnosis not present

## 2020-10-15 DIAGNOSIS — H538 Other visual disturbances: Secondary | ICD-10-CM | POA: Insufficient documentation

## 2020-10-15 MED ORDER — GABAPENTIN 300 MG PO CAPS: 300.0000 mg | ORAL_CAPSULE | Freq: Two times a day (BID) | ORAL | 1 refills | Status: DC

## 2020-10-15 NOTE — Assessment & Plan Note (Addendum)
Was referred to Orthopedic surgeon in the past by previous PCP No signs of bony metastasis Followed by Oncology for prostate cancer history. Continue Gabapentin for neuropathic pain

## 2020-10-15 NOTE — Patient Instructions (Signed)
Please start taking Gabapentin twice daily instead of once daily for back pain.  Please take Famotidine only as needed at nighttime for acid reflux. Continue taking Pantoprazole.  Please avoid hot and spicy food. Avoid eating 2 hours before bedtime.

## 2020-10-15 NOTE — Assessment & Plan Note (Signed)
Recent worsening No eye pain or floaters, but very limited vision - only finger count from 3 feet distance Has not been able to see Ophthalmology yet, urgent referral made

## 2020-10-15 NOTE — Progress Notes (Signed)
Established Patient Office Visit  Subjective:  Patient ID: Cory Foley, male    DOB: Apr 03, 1950  Age: 70 y.o. MRN: [id number]  CC:  Chief Complaint  Patient presents with   Back Pain    DDD - pain     HPI Cory Foley is a 70 year old male with PMH of palatine tonsil ca. s/p chemotherapy, prostate ca. s/p  radiotherapy, PVD s/p common iliac artery stent placement, osteoporosis, chronic low back pain, restless legs syndrome and insomnia who presents for follow up of his chronic medical conditions.  Back pain/neuropathy: Still has persistent back pain especially in the evening and affects his sleep. Of note, he has been taking Gabapentin in the morning to help with the pain during daytime. States that his pain is better for 6 hours after taking Gabapentin. Has intermittent numbness and burning in the L > R LE intermittently. Denies any weakness of LE.  He has been taking Pantoprazole and Pepcid for GERD. Denies any dysphagia or odynophagia.  He still c/o right sided blurry vision and has not seen Ophthalmologist yet. He has been seeing watery image from right side, but denies any floaters or pain.  Past Medical History:  Diagnosis Date   Allergy    Anxiety    Arthritis    BPH with obstruction/lower urinary tract symptoms    COPD, mild (HCC)    current smoker   ED (erectile dysfunction)    GERD (gastroesophageal reflux disease)    Hyperlipidemia    Nocturia    Osteoporosis    PAD (peripheral artery disease) ([place])    Port-A-Cath in place 04/28/2019   Right   Prostate cancer ([place]) 01/11/2012   Adenocarcinoma, Gleason=4+4=8, & 4+5=9,PSA=17.24, Volume= 15.45cc   PVD (peripheral vascular disease) ([place])    Restless leg syndrome    Tongue mass    SCC    Past Surgical History:  Procedure Laterality Date   ABDOMINAL AORTOGRAM W/LOWER EXTREMITY N/A 08/16/2018   Procedure: ABDOMINAL AORTOGRAM W/LOWER EXTREMITY;  Surgeon: Serafina Mitchell, MD;  Location: [facility];   Service: Cardiovascular;  Laterality: N/A;  Bilateral   COLONOSCOPY N/A 09/28/2012   Procedure: COLONOSCOPY;  Surgeon: Danie Binder, MD;  [facility];  Service: Endoscopy;  Laterality: N/A;  8:30   DIRECT LARYNGOSCOPY N/A 04/03/2019   Procedure: DIRECT LARYNGOSCOPY;  Surgeon: Leta Baptist, MD;  Location: [facility];  Service: ENT;  Laterality: N/A;   ESOPHAGOGASTRODUODENOSCOPY N/A 09/28/2012   Procedure: ESOPHAGOGASTRODUODENOSCOPY (EGD);  Surgeon: Danie Binder, MD;  [facility];  Service: Endoscopy;  Laterality: N/A;   Gold seed implatation  04/28/2012   HERNIA REPAIR     Bilateral inguinal X2   IR IMAGING GUIDED PORT INSERTION  05/03/2019   Right   JOINT REPLACEMENT     bilateral   KNEE SURGERY     Left Knee X 2   and Right knee X1   MALONEY DILATION N/A 09/28/2012   Procedure: MALONEY DILATION;  Surgeon: Danie Binder, MD;  [facility];  Service: Endoscopy;  Laterality: N/A;   NOSE SURGERY     PERIPHERAL VASCULAR INTERVENTION  08/16/2018   Procedure: PERIPHERAL VASCULAR INTERVENTION;  Surgeon: Serafina Mitchell, MD;  Location: [facility];  Service: Cardiovascular;;  LT Iliac   PORT-A-CATH REMOVAL Right 03/06/2020   Procedure: MINOR REMOVAL PORT-A-CATH;  Surgeon: [name], MD;  Location: [facility];  Service: General;  Laterality: Right;   PR  VEIN BYPASS GRAFT,AORTO-FEM-POP  10/21/10   Left AK to BK popliteal BPG   PROSTATE BIOPSY  01/11/2012   Adenocarcinoma   PROSTATE SURGERY  2015   Chemo and  Radiation   SAVORY DILATION N/A 09/28/2012   Procedure: SAVORY DILATION;  Surgeon: Danie Binder, MD;  [facility];  Service: Endoscopy;  Laterality: N/A;   throat biopsy  03/20/2019   TONSILLECTOMY Left 04/03/2019   Procedure: TONSILLECTOMY;  Surgeon: Leta Baptist, MD;  Location: [facility];  Service: ENT;  Laterality: Left;    Family History  Problem Relation Age of Onset   Heart disease Mother        Valve  regurgitation and Pacemaker    Diabetes Mother    Hyperlipidemia Mother    Heart disease Father        CABG x 5   Hyperlipidemia Father    Hypertension Father    Heart attack Father    Heart disease Sister        aortic valve replacement   Cancer Sister 47       Colon cancer w/ metastasis   Throat cancer Sister    Hypertension Brother    Heart disease Maternal Grandmother    Heart disease Maternal Grandfather    Heart disease Paternal Grandmother    Cancer Paternal Grandfather    Healthy Son    Healthy Daughter     Social History   Socioeconomic History   Marital status: Married    Spouse name: Engineer, maintenance   Number of children: 2   Years of education: Secretary/administrator   Highest education level: Not on file  Occupational History    Employer: [employer]    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   Caffeine use: very little   Social Determinants of Radio broadcast assistant Strain: Not on file  Food Insecurity: Not on file  Transportation Needs: Not on file  Physical Activity: Not on file  Stress: Not on file  Social Connections: Not on file  Intimate Partner Violence: Not on file    Outpatient Medications Prior to Visit  Medication Sig Dispense Refill   aspirin EC 81 MG tablet Take 81 mg by mouth daily.     calcium carbonate (OS-CAL) 600 MG tablet Take 1 tablet (600 mg total) by mouth 2 (two) times daily with a meal. (Patient taking differently: Take 600 mg by mouth 2 (two) times daily.) 30 tablet     Cholecalciferol (VITAMIN D) 50 MCG (2000 UT) tablet Take 2,000 Units by mouth daily.     clopidogrel (PLAVIX) 75 MG tablet TAKE 1 TABLET BY MOUTH ONCE A DAY. 30 tablet 0   cyanocobalamin 1000 MCG tablet Take 1,000 mcg by mouth 2 (two) times daily.     famotidine (PEPCID) 20 MG tablet Take 1 tablet (20 mg total) by  mouth at bedtime. 90 tablet 1   Ferrous Sulfate (IRON) 325 (65 Fe) MG TABS 1 tablet daily 30 tablet 0   HYDROcodone-acetaminophen (NORCO) 10-325 MG tablet TAKE 1 TABLET BY MOUTH EVERY FOUR HOURS AS NEEDED. 120 tablet 0   magnesium oxide (MAG-OX) 400 (241.3 Mg) MG tablet Take 1 tablet (400 mg total) by mouth 2 (two) times daily. 60 tablet 1   pantoprazole (PROTONIX) 40 MG tablet Take 1 tablet (40 mg total) by mouth daily. 90 tablet 1   simvastatin (ZOCOR) 40 MG tablet Take 1 tablet (40 mg total) by mouth at bedtime. 90 tablet 1   gabapentin (NEURONTIN) 300 MG capsule TAKE (1)CAPSULE BY MOUTH AT BEDTIME. 30 capsule 0   No facility-administered medications prior to visit.    Allergies  Allergen Reactions   Codeine Other (See Comments)    Bad headache and sweats   Morphine And Related Itching    sweats   Percocet [Oxycodone-Acetaminophen] Other (See Comments)    Headache and sweats    ROS Review of Systems  Constitutional:  Negative for chills and fever.  HENT:  Negative for congestion and sore throat.   Eyes:  Positive for discharge and visual disturbance. Negative for pain.  Respiratory:  Negative for cough and shortness of breath.   Cardiovascular:  Negative for chest pain and palpitations.  Gastrointestinal:  Negative for constipation, diarrhea, nausea and vomiting.  Endocrine: Negative for polydipsia and polyuria.  Genitourinary:  Negative for dysuria and hematuria.  Musculoskeletal:  Positive for arthralgias and back pain. Negative for neck pain and neck stiffness.  Skin:  Negative for rash.  Neurological:  Negative for dizziness, weakness, numbness and headaches.   Psychiatric/Behavioral:  Negative for agitation and behavioral problems.      Objective:    Physical Exam Vitals reviewed.  Constitutional:      General: He is not in acute distress.    Appearance: He is not diaphoretic.  HENT:     Head: Normocephalic and atraumatic.     Nose: Nose normal.     Mouth/Throat:     Mouth: Mucous membranes are moist.  Eyes:     General: No scleral icterus.    Extraocular Movements: Extraocular movements intact.     Comments: Right eye - finger count from 3 feet only  Cardiovascular:     Rate and Rhythm: Normal rate and regular rhythm.     Pulses: Normal pulses.     Heart sounds: Normal heart sounds. No murmur heard. Pulmonary:     Breath sounds: Normal breath sounds. No wheezing or rales.  Musculoskeletal:     Cervical back: Neck supple. No tenderness.     Right lower leg: No edema.     Left lower leg: No edema.  Skin:    General: Skin is warm.     Findings: No rash.  Neurological:     General: No focal deficit present.     Mental Status: He is alert and oriented to person, place, and time.     Sensory: No sensory deficit.     Motor: No weakness.  Psychiatric:        Mood and Affect: Mood normal.        Behavior: Behavior normal.    BP 125/74 (BP Location: Right Arm, Patient Position: Sitting, Cuff Size: Large)   Pulse 62   Temp 98.4 F (36.9 C)   Resp 20   Ht '5\' 8"'$  (1.727 m)   Wt 162 lb (73.5 kg)  SpO2 94%   BMI 24.63 kg/m  Wt Readings from Last 3 Encounters:  10/15/20 162 lb (73.5 kg)  08/01/20 150 lb 6.4 oz (68.2 kg)  05/13/20 150 lb 1.9 oz (68.1 kg)     Health Maintenance Due  Topic Date Due   COVID-19 Vaccine (1) Never done   TETANUS/TDAP  02/24/2015   INFLUENZA VACCINE  09/23/2020    There are no preventive care reminders to display for this patient.  Lab Results  Component Value Date   TSH 1.982 07/25/2020   Lab Results  Component Value Date   WBC 3.7 (L) 07/25/2020   HGB 12.1 (L) 07/25/2020   HCT 38.4  (L) 07/25/2020   MCV 107.6 (H) 07/25/2020   PLT 166 07/25/2020   Lab Results  Component Value Date   NA 138 07/25/2020   K 4.5 07/25/2020   CO2 29 07/25/2020   GLUCOSE 97 07/25/2020   BUN 12 07/25/2020   CREATININE 1.11 07/25/2020   BILITOT 0.4 07/25/2020   ALKPHOS 51 07/25/2020   AST 15 07/25/2020   ALT 12 07/25/2020   PROT 6.6 07/25/2020   ALBUMIN 3.8 07/25/2020   CALCIUM 9.1 07/25/2020   ANIONGAP 6 07/25/2020   Lab Results  Component Value Date   CHOL 185 04/08/2020   Lab Results  Component Value Date   HDL 58 04/08/2020   Lab Results  Component Value Date   LDLCALC 106 (H) 04/08/2020   Lab Results  Component Value Date   TRIG 112 04/08/2020   Lab Results  Component Value Date   CHOLHDL 3.2 04/08/2020   Lab Results  Component Value Date   HGBA1C 5.5 03/13/2016      Assessment & Plan:   Problem List Items Addressed This Visit       Digestive   GERD (gastroesophageal reflux disease)    On Pantoprazole and Pepcid Advised to take Pepcid only as needed for persistent acid reflux Continue Pantoprazole        Nervous and Auditory   Peripheral neuropathy    Uncontrolled with Gabapentin 300 mg qHS Patient has been taking it during daytime to help with pain and has persistent symptoms by evening and it affects sleep. Increased to Gabapentin 300 mg BID      Relevant Medications   gabapentin (NEURONTIN) 300 MG capsule     Musculoskeletal and Integument   DDD (degenerative disc disease), lumbar - Primary    Was referred to Orthopedic surgeon in the past by previous PCP No signs of bony metastasis Followed by Oncology for prostate cancer history. Continue Gabapentin for neuropathic pain        Other   Hyperlipidemia    On Simvastatin      Blurry vision, right eye    Recent worsening No eye pain or floaters, but very limited vision - only finger count from 3 feet distance Has not been able to see Ophthalmology yet, urgent referral made       Relevant Orders   Ambulatory referral to Ophthalmology   Other Visit Diagnoses     Immunization due       Relevant Orders   Varicella-zoster vaccine IM (Completed)       Meds ordered this encounter  Medications   gabapentin (NEURONTIN) 300 MG capsule    Sig: Take 1 capsule (300 mg total) by mouth 2 (two) times daily.    Dispense:  180 capsule    Refill:  1    Follow-up: Return in about 4 months (  around 02/14/2021) for Annual physical.    Lindell Spar, MD

## 2020-10-15 NOTE — Assessment & Plan Note (Signed)
On Pantoprazole and Pepcid Advised to take Pepcid only as needed for persistent acid reflux Continue Pantoprazole

## 2020-10-15 NOTE — Assessment & Plan Note (Signed)
Uncontrolled with Gabapentin 300 mg qHS Patient has been taking it during daytime to help with pain and has persistent symptoms by evening and it affects sleep. Increased to Gabapentin 300 mg BID

## 2020-10-15 NOTE — Assessment & Plan Note (Signed)
On Simvastatin 

## 2020-10-16 DIAGNOSIS — H25813 Combined forms of age-related cataract, bilateral: Secondary | ICD-10-CM | POA: Diagnosis not present

## 2020-10-16 DIAGNOSIS — D3132 Benign neoplasm of left choroid: Secondary | ICD-10-CM | POA: Diagnosis not present

## 2020-10-29 ENCOUNTER — Ambulatory Visit (INDEPENDENT_AMBULATORY_CARE_PROVIDER_SITE_OTHER): Payer: Medicare Other

## 2020-10-29 DIAGNOSIS — Z0000 Encounter for general adult medical examination without abnormal findings: Secondary | ICD-10-CM

## 2020-10-29 NOTE — Progress Notes (Signed)
Subjective:   Cory Foley is a 70 y.o. male who presents for Medicare Annual/Subsequent preventive examination.I connected with  Cory Foley on 10/29/20 by a audio enabled telemedicine application and verified that I am speaking with the correct person using two identifiers.   I discussed the limitations of evaluation and management by telemedicine. The patient expressed understanding and agreed to proceed.   Location of patient:Home  Location of Provider: Office  Persons participating in virtual visit: Cory Foley (patient) and Edgar Frisk, CMA  Review of Systems    Defer to PCP       Objective:    Today's Vitals   10/29/20 1509  PainSc: 4    There is no height or weight on file to calculate BMI.  Advanced Directives 10/29/2020 08/01/2020 01/25/2020 11/16/2019 10/27/2019 09/15/2019 08/16/2019  Does Patient Have a Medical Advance Directive? Yes Yes Yes Yes Yes Yes Yes  Type of Paramedic of [place];Living will [place];Living will [place];Living will [place];Living will [place];Living will Living will;Healthcare Power of [place];Living will  Does patient want to make changes to medical advance directive? - No - Patient declined No - Patient declined No - Patient declined No - Patient declined No - Patient declined No - Patient declined  Copy of Healthcare Power of Attorney in Chart? - No - copy requested No - copy requested No - copy requested Yes - validated most recent copy scanned in chart (See row information) - No - copy requested  Would patient like information on creating a medical advance directive? - No - Patient declined No - Patient declined No - Patient declined - No - Patient declined No - Patient declined    Current Medications (verified) Outpatient Encounter Medications as of 10/29/2020  Medication Sig   aspirin EC 81 MG tablet Take 81  mg by mouth daily.   calcium carbonate (OS-CAL) 600 MG tablet Take 1 tablet (600 mg total) by mouth 2 (two) times daily with a meal. (Patient taking differently: Take 600 mg by mouth 2 (two) times daily.)   Cholecalciferol (VITAMIN D) 50 MCG (2000 UT) tablet Take 2,000 Units by mouth daily.   clopidogrel (PLAVIX) 75 MG tablet TAKE 1 TABLET BY MOUTH ONCE A DAY.   cyanocobalamin 1000 MCG tablet Take 1,000 mcg by mouth 2 (two) times daily.   famotidine (PEPCID) 20 MG tablet Take 1 tablet (20 mg total) by mouth at bedtime.   Ferrous Sulfate (IRON) 325 (65 Fe) MG TABS 1 tablet daily   gabapentin (NEURONTIN) 300 MG capsule Take 1 capsule (300 mg total) by mouth 2 (two) times daily.   HYDROcodone-acetaminophen (NORCO) 10-325 MG tablet TAKE 1 TABLET BY MOUTH EVERY FOUR HOURS AS NEEDED.   magnesium oxide (MAG-OX) 400 (241.3 Mg) MG tablet Take 1 tablet (400 mg total) by mouth 2 (two) times daily.   pantoprazole (PROTONIX) 40 MG tablet Take 1 tablet (40 mg total) by mouth daily.   simvastatin (ZOCOR) 40 MG tablet Take 1 tablet (40 mg total) by mouth at bedtime.   rOPINIRole (REQUIP) 3 MG tablet Take 3 mg by mouth at bedtime.   No facility-administered encounter medications on file as of 10/29/2020.    Allergies (verified) Codeine, Morphine and related, and Percocet [oxycodone-acetaminophen]   History: Past Medical History:  Diagnosis Date   Allergy    Anxiety    Arthritis    BPH with obstruction/lower urinary tract  symptoms    Cataract    COPD, mild ([place])    current smoker   ED (erectile dysfunction)    GERD (gastroesophageal reflux disease)    Hyperlipidemia    Nocturia    Osteoporosis    PAD (peripheral artery disease) ([place])    Port-A-Cath in place 04/28/2019   Right   Prostate cancer ([place]) 01/11/2012   Adenocarcinoma, Gleason=4+4=8, & 4+5=9,PSA=17.24, Volume= 15.45cc   PVD (peripheral vascular disease) ([place])    Restless leg syndrome    Tongue mass    SCC   Past Surgical History:   Procedure Laterality Date   ABDOMINAL AORTOGRAM W/LOWER EXTREMITY N/A 08/16/2018   Procedure: ABDOMINAL AORTOGRAM W/LOWER EXTREMITY;  Surgeon: Serafina Mitchell, MD;  Location: [facility];  Service: Cardiovascular;  Laterality: N/A;  Bilateral   COLONOSCOPY N/A 09/28/2012   Procedure: COLONOSCOPY;  Surgeon: Danie Binder, MD;  [facility];  Service: Endoscopy;  Laterality: N/A;  8:30   DIRECT LARYNGOSCOPY N/A 04/03/2019   Procedure: DIRECT LARYNGOSCOPY;  Surgeon: Leta Baptist, MD;  Location: [facility];  Service: ENT;  Laterality: N/A;   ESOPHAGOGASTRODUODENOSCOPY N/A 09/28/2012   Procedure: ESOPHAGOGASTRODUODENOSCOPY (EGD);  Surgeon: Danie Binder, MD;  [facility];  Service: Endoscopy;  Laterality: N/A;   Gold seed implatation  04/28/2012   HERNIA REPAIR     Bilateral inguinal X2   IR IMAGING GUIDED PORT INSERTION  05/03/2019   Right   JOINT REPLACEMENT     bilateral   KNEE SURGERY     Left Knee X 2   and Right knee X1   MALONEY DILATION N/A 09/28/2012   Procedure: MALONEY DILATION;  Surgeon: Danie Binder, MD;  [facility];  Service: Endoscopy;  Laterality: N/A;   NOSE SURGERY     PERIPHERAL VASCULAR INTERVENTION  08/16/2018   Procedure: PERIPHERAL VASCULAR INTERVENTION;  Surgeon: Serafina Mitchell, MD;  Location: [facility];  Service: Cardiovascular;;  LT Iliac   PORT-A-CATH REMOVAL Right 03/06/2020   Procedure: MINOR REMOVAL PORT-A-CATH;  Surgeon: [name], MD;  Location: [facility];  Service: General;  Laterality: Right;   PR VEIN BYPASS GRAFT,AORTO-FEM-POP  10/21/10   Left AK to BK popliteal BPG   PROSTATE BIOPSY  01/11/2012   Adenocarcinoma   PROSTATE SURGERY  2015   Chemo and  Radiation   SAVORY DILATION N/A 09/28/2012   Procedure: SAVORY DILATION;  Surgeon: Danie Binder, MD;  [facility];  Service: Endoscopy;  Laterality: N/A;   throat biopsy  03/20/2019   TONSILLECTOMY Left 04/03/2019   Procedure:  TONSILLECTOMY;  Surgeon: Leta Baptist, MD;  Location: [facility];  Service: ENT;  Laterality: Left;   Family History  Problem Relation Age of Onset   Heart disease Mother        Valve regurgitation and Pacemaker    Diabetes Mother    Hyperlipidemia Mother    Heart disease Father        CABG x 5   Hyperlipidemia Father    Hypertension Father    Heart attack Father    Heart disease Sister        aortic valve replacement   Cancer Sister 46       Colon cancer w/ metastasis   Throat cancer Sister    Hypertension Brother    Heart disease Maternal Grandmother    Heart disease Maternal Grandfather    Heart disease Paternal Grandmother  Cancer Paternal Grandfather    Healthy Son    Healthy Daughter    Social History   Socioeconomic History   Marital status: Married    Spouse name: Glenda   Number of children: 2   Years of education: College   Highest education level: Not on file  Occupational History    Employer: [employer]    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   Caffeine use: very little   Social Determinants of Radio broadcast assistant Strain: Low Risk    Difficulty of Paying Living Expenses: Not hard at all  Food Insecurity: No Food Insecurity   Worried About Charity fundraiser in the Last Year: Never true   Arboriculturist in the Last Year: Never true  Transportation Needs: No Transportation Needs   Lack of Transportation (Medical): No   Lack of Transportation  (Non-Medical): No  Physical Activity: Insufficiently Active   Days of Exercise per Week: 3 days   Minutes of Exercise per Session: 30 min  Stress: No Stress Concern Present   Feeling of Stress : Not at all  Social Connections: Moderately Isolated   Frequency of Communication with Friends and Family: Once a week   Frequency of Social Gatherings with Friends and Family: Once a week   Attends Religious Services: More than 4 times per year   Active Member of [employer] or Organizations: No   Attends Music therapist: Never   Marital Status: Married    Tobacco Counseling Ready to quit: Not Answered Counseling given: Not Answered Tobacco comments: 1/2 ppd   Clinical Intake:  Pre-visit preparation completed: Yes  Pain : 0-10 Pain Score: 4  Pain Type: Chronic pain Pain Location: Back Pain Orientation: Lower Pain Radiating Towards: down leg Pain Descriptors / Indicators: Aching Pain Relieving Factors: Hydrocodone-Gabapentin  Pain Relieving Factors: Hydrocodone-Gabapentin  Nutritional Risks: None  What is the last grade level you completed in school?: College  Diabetic?No  Interpreter Needed?: No  Information entered by :: Demitria Hay J,CMA   Activities of Daily Living In your present state of health, do you have any difficulty performing the following activities: 10/29/2020  Hearing? N  Vision? N  Difficulty concentrating or making decisions? N  Walking or climbing stairs? N  Dressing or bathing? N  Doing errands, shopping? N  Preparing Food and eating ? N  Using the Toilet? N  In the past six months, have you accidently leaked urine? N  Do you have problems with loss of bowel control? N  Managing your Medications? N  Managing your Finances? N  Housekeeping or managing your Housekeeping? N  Some recent data might be hidden    Patient Care Team: Lindell Spar, MD as PCP - General (Internal Medicine) Bensimhon, Shaune Pascal, MD (Cardiology) Derek Jack, MD as Consulting Physician ([place]) Redmond Pulling,  Lolly Mustache, RN as Oncology Nurse Navigator (Oncology) Edythe Clarity, [facility] as Pharmacist (Pharmacist)  Indicate any recent Medical Services you may have received from other than [facility] providers in the past year (date may be approximate).     Assessment:   This is a routine wellness examination for [place].  Hearing/Vision screen No results found.  Dietary issues and exercise activities discussed: Current Exercise Habits: Home exercise routine, Type of exercise: walking, Time (Minutes): 30, Frequency (Times/Week): 3, Weekly Exercise (Minutes/Week): 90, Intensity: Mild   Goals Addressed   None    Depression Screen PHQ 2/9 Scores 10/29/2020 10/15/2020 05/13/2020 10/27/2019 06/26/2019 03/21/2019 12/26/2018  PHQ - 2 Score 0 0 0 0 1 1 0  PHQ- 9 Score - - 3 0 1 - 0    Fall Risk Fall Risk  10/29/2020 10/15/2020 05/13/2020 02/27/2020 10/27/2019  Falls in the past year? 0 0 0 0 0  Number falls in past yr: 0 0 0 - -  Injury with Fall? 0 0 0 - -  Risk for fall due to : No Fall Risks No Fall Risks No Fall Risks - No Fall Risks  Follow up Falls evaluation completed Falls evaluation completed Falls evaluation completed Falls evaluation completed Falls evaluation completed    FALL RISK PREVENTION PERTAINING TO THE HOME:  Any stairs in or around the home? No  If so, are there any without handrails? No  Home free of loose throw rugs in walkways, pet beds, electrical cords, etc? Yes  Adequate lighting in your home to reduce risk of falls? Yes   ASSISTIVE DEVICES UTILIZED TO PREVENT FALLS:  Life alert? No  Use of a cane, walker or w/c? No  Grab bars in the bathroom? Yes  Shower chair or bench in shower? Yes  Elevated toilet seat or a handicapped toilet? No   TIMED UP AND GO:  Was the test performed?  N/A .  Length of time to ambulate 10 feet: N/A sec.     Cognitive Function:     6CIT Screen 10/29/2020  What Year? 0 points  What month? 0  points  What time? 0 points  Count back from 20 0 points  Months in reverse 0 points  Repeat phrase 0 points  Total Score 0    Immunizations Immunization History  Administered Date(s) Administered   Fluad Quad(high Dose 65+) 12/26/2018, 10/27/2019   Influenza, High Dose Seasonal PF 01/25/2018   Influenza,inj,Quad PF,6+ Mos 11/11/2015, 11/16/2016   Pneumococcal Conjugate-13 07/14/2016   Pneumococcal Polysaccharide-23 06/07/2013   Td 02/23/2005   Zoster Recombinat (Shingrix) 07/14/2016, 10/15/2020    TDAP status: Due, Education has been provided regarding the importance of this vaccine. Advised may receive this vaccine at local pharmacy or Health Dept. Aware to provide a copy of the vaccination record if obtained from local pharmacy or Health Dept. Verbalized acceptance and understanding.  Flu Vaccine status: Due, Education has been provided regarding the importance of this vaccine. Advised may receive this vaccine at local pharmacy or Health Dept. Aware to provide a copy of the vaccination record if obtained from local pharmacy or Health Dept. Verbalized acceptance and understanding.  Pneumococcal vaccine status: Up to date  Covid-19 vaccine status: Information provided on how to obtain vaccines.   Qualifies for Shingles Vaccine? Yes   Zostavax completed  N/A   Shingrix Completed?: Yes  Screening Tests Health Maintenance  Topic Date Due   COVID-19 Vaccine (1) Never done   TETANUS/TDAP  02/24/2015   INFLUENZA VACCINE  09/23/2020   COLONOSCOPY (Pts 45-56yr Insurance coverage will need to be confirmed)  09/29/2022   Hepatitis C Screening  Completed   PNA vac Low Risk Adult  Completed   Zoster Vaccines- Shingrix  Completed   HPV VACCINES  Aged Out    Health Maintenance  Health Maintenance Due  Topic Date Due   COVID-19 Vaccine (1) Never done   TETANUS/TDAP  02/24/2015   INFLUENZA VACCINE  09/23/2020    Colorectal cancer screening: Type of screening: Colonoscopy.  Completed 09/28/2012. Repeat every 10 years  Lung Cancer Screening: (Low Dose CT Chest recommended if Age 70-80years, 30 pack-year currently smoking OR have quit w/in 15years.) does qualify.   Lung Cancer Screening Referral: no  Additional Screening:  Hepatitis C Screening: does qualify; Completed 07/14/2016  Vision Screening: Recommended annual ophthalmology exams for early detection of glaucoma and other disorders of the eye. Is the patient up to date with their annual eye exam?  Yes  Who is the provider or what is the name of the office in which the patient attends annual eye exams? Dr. GKaty FitchIf pt is not established with a provider, would they like to be referred to a provider to establish care? No .   Dental Screening: Recommended annual dental exams for proper oral hygiene  Community Resource Referral / Chronic Care Management: CRR required this visit?  No   CCM required this visit?  No      Plan:     I have personally reviewed and noted the following in the patient's chart:   Medical and social history Use of alcohol, tobacco or illicit drugs  Current medications and supplements including opioid prescriptions. Patient is currently taking opioid prescriptions. Information provided to patient regarding non-opioid alternatives. Patient advised to discuss non-opioid treatment plan with their provider. Functional ability and status Nutritional status Physical activity Advanced directives List of other physicians Hospitalizations, surgeries, and ER visits in previous 12 months Vitals Screenings to include cognitive, depression, and falls Referrals and appointments  In addition, I have reviewed and discussed with patient certain preventive protocols, quality metrics, and best practice recommendations. A written personalized care plan for preventive services as well as general preventive health recommendations were provided to patient.     [facility]Edgar Frisk [facility]  10/29/2020    Nurse Notes: Non Face to Face 30 minute visit encounter.  Mr. GThiry, Thank you for taking time to come for your Medicare Wellness Visit. I appreciate your ongoing commitment to your health goals. Please review the following plan we discussed and let me know if I can assist you in the future.   These are the goals we discussed:  Goals      Pharmacy Care Plan:     CARE PLAN ENTRY  Current Barriers:  Chronic Disease Management support, education, and care coordination needs related to Hyperlipidemia, Osteoarthritis, and restless leg syndrome   Hyperlipidemia Pharmacist Clinical Goal(s): Over the next 120 days, patient will work with PharmD and providers to maintain LDL goal < 100 Current regimen:  Simvastatin '40mg'$  daily Interventions: Congratulated on success getting LDL to goal Discussed incidence of leg pain Patient self care activities - Over the next 120 days, patient will: Focus on medication adherence by pill box Continue to take medication as prescribed   Restless Leg Syndrome Pharmacist Clinical Goal(s) Over the next 120 days, patient will work with PharmD and providers to optimize medication and minimize symptoms related to RLS. Current regimen:  Ropinirole '3mg'$  at  bedtime Interventions: Comprehensive medication review Continue current therapy Move mirtazapine '30mg'$  closer to bed time to see if this helps sleeping Patient self care activities - Over the next 120 days, patient will: Continue to focus on medication adherence by pill box Contact PharmD or PCP with any medication related concerns or increase in symptoms  Osteoarthritis Pharmacist Clinical Goal(s) Over the next 120 days, patient will work with PharmD and providers to optimize medication and minimize symptoms related to osteoarthritis. Current regimen:  Hydrocodon/APA 10-'325mg'$  every 4 hours as needed Interventions: Comprehensive medication review Continue current therapy Evaluated current pain  levels Patient self care activities - Over the next 120 days, patient will: Continue to focus on medication adherence by pill box Contact PharmD or PCP with any medication related concerns or increase in symptoms  Please see past updates related to this goal by clicking on the "Past Updates" button in the selected goal          This is a list of the screening recommended for you and due dates:  Health Maintenance  Topic Date Due   COVID-19 Vaccine (1) Never done   Tetanus Vaccine  02/24/2015   Flu Shot  09/23/2020   Colon Cancer Screening  09/29/2022   Hepatitis C Screening: USPSTF Recommendation to screen - Ages 18-79 yo.  Completed   Pneumonia vaccines  Completed   Zoster (Shingles) Vaccine  Completed   HPV Vaccine  Aged Out   Patient advised to view AVS on MyChart

## 2020-10-29 NOTE — Patient Instructions (Signed)
Health Maintenance, Male Adopting a healthy lifestyle and getting preventive care are important in promoting health and wellness. Ask your health care provider about: The right schedule for you to have regular tests and exams. Things you can do on your own to prevent diseases and keep yourself healthy. What should I know about diet, weight, and exercise? Eat a healthy diet  Eat a diet that includes plenty of vegetables, fruits, low-fat dairy products, and lean protein. Do not eat a lot of foods that are high in solid fats, added sugars, or sodium. Maintain a healthy weight Body mass index (BMI) is a measurement that can be used to identify possible weight problems. It estimates body fat based on height and weight. Your health care provider can help determine your BMI and help you achieve or maintain a healthy weight. Get regular exercise Get regular exercise. This is one of the most important things you can do for your health. Most adults should: Exercise for at least 150 minutes each week. The exercise should increase your heart rate and make you sweat (moderate-intensity exercise). Do strengthening exercises at least twice a week. This is in addition to the moderate-intensity exercise. Spend less time sitting. Even light physical activity can be beneficial. Watch cholesterol and blood lipids Have your blood tested for lipids and cholesterol at 70 years of age, then have this test every 5 years. You may need to have your cholesterol levels checked more often if: Your lipid or cholesterol levels are high. You are older than 70 years of age. You are at high risk for heart disease. What should I know about cancer screening? Many types of cancers can be detected early and may often be prevented. Depending on your health history and family history, you may need to have cancer screening at various ages. This may include screening for: Colorectal cancer. Prostate cancer. Skin cancer. Lung  cancer. What should I know about heart disease, diabetes, and high blood pressure? Blood pressure and heart disease High blood pressure causes heart disease and increases the risk of stroke. This is more likely to develop in people who have high blood pressure readings, are of African descent, or are overweight. Talk with your health care provider about your target blood pressure readings. Have your blood pressure checked: Every 3-5 years if you are 18-39 years of age. Every year if you are 40 years old or older. If you are between the ages of 65 and 75 and are a current or former smoker, ask your health care provider if you should have a one-time screening for abdominal aortic aneurysm (AAA). Diabetes Have regular diabetes screenings. This checks your fasting blood sugar level. Have the screening done: Once every three years after age 45 if you are at a normal weight and have a low risk for diabetes. More often and at a younger age if you are overweight or have a high risk for diabetes. What should I know about preventing infection? Hepatitis B If you have a higher risk for hepatitis B, you should be screened for this virus. Talk with your health care provider to find out if you are at risk for hepatitis B infection. Hepatitis C Blood testing is recommended for: Everyone born from 1945 through 1965. Anyone with known risk factors for hepatitis C. Sexually transmitted infections (STIs) You should be screened each year for STIs, including gonorrhea and chlamydia, if: You are sexually active and are younger than 70 years of age. You are older than 70 years   of age and your health care provider tells you that you are at risk for this type of infection. Your sexual activity has changed since you were last screened, and you are at increased risk for chlamydia or gonorrhea. Ask your health care provider if you are at risk. Ask your health care provider about whether you are at high risk for HIV.  Your health care provider may recommend a prescription medicine to help prevent HIV infection. If you choose to take medicine to prevent HIV, you should first get tested for HIV. You should then be tested every 3 months for as long as you are taking the medicine. Follow these instructions at home: Lifestyle Do not use any products that contain nicotine or tobacco, such as cigarettes, e-cigarettes, and chewing tobacco. If you need help quitting, ask your health care provider. Do not use street drugs. Do not share needles. Ask your health care provider for help if you need support or information about quitting drugs. Alcohol use Do not drink alcohol if your health care provider tells you not to drink. If you drink alcohol: Limit how much you have to 0-2 drinks a day. Be aware of how much alcohol is in your drink. In the [place] one drink equals one 12 oz bottle of beer (355 mL), one 5 oz glass of wine (148 mL), or one 1 oz glass of hard liquor (44 mL). General instructions Schedule regular health, dental, and eye exams. Stay current with your vaccines. Tell your health care provider if: You often feel depressed. You have ever been abused or do not feel safe at home. Summary Adopting a healthy lifestyle and getting preventive care are important in promoting health and wellness. Follow your health care provider's instructions about healthy diet, exercising, and getting tested or screened for diseases. Follow your health care provider's instructions on monitoring your cholesterol and blood pressure. This information is not intended to replace advice given to you by your health care provider. Make sure you discuss any questions you have with your health care provider. Document Revised: 04/19/2020 Document Reviewed: 02/02/2018 Elsevier Patient Education  2022 [employer] Inc[employer]  

## 2020-11-02 ENCOUNTER — Other Ambulatory Visit: Payer: Self-pay | Admitting: Internal Medicine

## 2020-11-12 DIAGNOSIS — M47816 Spondylosis without myelopathy or radiculopathy, lumbar region: Secondary | ICD-10-CM | POA: Diagnosis not present

## 2020-11-12 DIAGNOSIS — F17210 Nicotine dependence, cigarettes, uncomplicated: Secondary | ICD-10-CM | POA: Diagnosis not present

## 2020-11-12 DIAGNOSIS — Z471 Aftercare following joint replacement surgery: Secondary | ICD-10-CM | POA: Diagnosis not present

## 2020-11-14 DIAGNOSIS — H268 Other specified cataract: Secondary | ICD-10-CM | POA: Diagnosis not present

## 2020-11-14 DIAGNOSIS — H25811 Combined forms of age-related cataract, right eye: Secondary | ICD-10-CM | POA: Diagnosis not present

## 2020-11-26 DIAGNOSIS — M47816 Spondylosis without myelopathy or radiculopathy, lumbar region: Secondary | ICD-10-CM | POA: Diagnosis not present

## 2020-12-09 ENCOUNTER — Other Ambulatory Visit: Payer: Self-pay | Admitting: Internal Medicine

## 2020-12-26 ENCOUNTER — Encounter (HOSPITAL_COMMUNITY)
Admission: RE | Admit: 2020-12-26 | Discharge: 2020-12-26 | Disposition: A | Discharge status: Home / Self Care | Payer: Medicare Other | Source: Ambulatory Visit | Attending: Internal Medicine | Admitting: Internal Medicine

## 2020-12-26 DIAGNOSIS — M810 Age-related osteoporosis without current pathological fracture: Secondary | ICD-10-CM | POA: Diagnosis not present

## 2020-12-26 LAB — POCT I-STAT, CHEM 8
BUN: 9 mg/dL (ref 8–23)
Calcium, Ion: 1.19 mmol/L (ref 1.15–1.40)
Chloride: 103 mmol/L (ref 98–111)
Creatinine, Ser: 1.1 mg/dL (ref 0.61–1.24)
Glucose, Bld: 94 mg/dL (ref 70–99)
HCT: 36 % — ABNORMAL LOW (ref 39.0–52.0)
Hemoglobin: 12.2 g/dL — ABNORMAL LOW (ref 13.0–17.0)
Potassium: 4 mmol/L (ref 3.5–5.1)
Sodium: 141 mmol/L (ref 135–145)
TCO2: 25 mmol/L (ref 22–32)

## 2020-12-26 MED ORDER — ZOLEDRONIC ACID 5 MG/100ML IV SOLN
5.0000 mg | Freq: Once | INTRAVENOUS | Status: AC
  Administered 2020-12-26: 5 mg via INTRAVENOUS
  Filled 2020-12-26: qty 100

## 2020-12-26 MED ORDER — SODIUM CHLORIDE 0.9 % IV SOLN: Freq: Once | INTRAVENOUS | Status: AC

## 2020-12-30 ENCOUNTER — Ambulatory Visit (INDEPENDENT_AMBULATORY_CARE_PROVIDER_SITE_OTHER): Payer: Medicare Other | Admitting: Internal Medicine

## 2020-12-30 ENCOUNTER — Encounter: Payer: Self-pay | Admitting: Internal Medicine

## 2020-12-30 ENCOUNTER — Other Ambulatory Visit: Payer: Self-pay

## 2020-12-30 VITALS — BP 130/78 | HR 81 | Resp 18 | Ht 68.0 in | Wt 160.1 lb

## 2020-12-30 DIAGNOSIS — G2581 Restless legs syndrome: Secondary | ICD-10-CM

## 2020-12-30 DIAGNOSIS — G43709 Chronic migraine without aura, not intractable, without status migrainosus: Secondary | ICD-10-CM | POA: Diagnosis not present

## 2020-12-30 MED ORDER — SUMATRIPTAN SUCCINATE 50 MG PO TABS: 50.0000 mg | ORAL_TABLET | ORAL | 1 refills | Status: DC | PRN

## 2020-12-30 MED ORDER — ROPINIROLE HCL 4 MG PO TABS: 4.0000 mg | ORAL_TABLET | Freq: Every day | ORAL | 2 refills | Status: DC

## 2020-12-30 NOTE — Assessment & Plan Note (Signed)
Uncontrolled with Ropinirole, increased dose to 4 mg On Gabapentin 300 mg BID Takes iron supplements as well Referred to Neurology

## 2020-12-30 NOTE — Progress Notes (Signed)
Acute Office Visit  Subjective:    Patient ID: Cory Foley, male    DOB: 15-Nov-1950, 70 y.o.   MRN: [id number]  Chief Complaint  Patient presents with   Acute Visit    Restless legs has been going on for years has been taking ropinirole this is not helping pt has bad back and has been taking injections for this     HPI Patient is in today for complaint of persistent tremors of the legs and urge to move legs at nighttime.  Of note, he takes ropinirole 3 mg at nighttime for RLS, but has been having worsening of his symptoms recently.  He has been taking iron supplements for IDA as well.  Denies any recent injury.  Denies any leg swelling currently.  He complains of headache, which is chronic, intermittent and last for few hours.  Headache is worse with light and noise exposure and gets better with rest.  He has tried Tylenol with minimal relief.  Past Medical History:  Diagnosis Date   Allergy    Anxiety    Arthritis    BPH with obstruction/lower urinary tract symptoms    Cataract    COPD, mild (HCC)    current smoker   ED (erectile dysfunction)    GERD (gastroesophageal reflux disease)    Hyperlipidemia    Nocturia    Osteoporosis    PAD (peripheral artery disease) ([place])    Port-A-Cath in place 04/28/2019   Right   Prostate cancer ([place]) 01/11/2012   Adenocarcinoma, Gleason=4+4=8, & 4+5=9,PSA=17.24, Volume= 15.45cc   PVD (peripheral vascular disease) ([place])    Restless leg syndrome    Tongue mass    SCC    Past Surgical History:  Procedure Laterality Date   ABDOMINAL AORTOGRAM W/LOWER EXTREMITY N/A 08/16/2018   Procedure: ABDOMINAL AORTOGRAM W/LOWER EXTREMITY;  Surgeon: Serafina Mitchell, MD;  Location: [facility];  Service: Cardiovascular;  Laterality: N/A;  Bilateral   COLONOSCOPY N/A 09/28/2012   Procedure: COLONOSCOPY;  Surgeon: Danie Binder, MD;  [facility];  Service: Endoscopy;  Laterality: N/A;  8:30   DIRECT LARYNGOSCOPY N/A 04/03/2019    Procedure: DIRECT LARYNGOSCOPY;  Surgeon: Leta Baptist, MD;  Location: [facility];  Service: ENT;  Laterality: N/A;   ESOPHAGOGASTRODUODENOSCOPY N/A 09/28/2012   Procedure: ESOPHAGOGASTRODUODENOSCOPY (EGD);  Surgeon: Danie Binder, MD;  [facility];  Service: Endoscopy;  Laterality: N/A;   Gold seed implatation  04/28/2012   HERNIA REPAIR     Bilateral inguinal X2   IR IMAGING GUIDED PORT INSERTION  05/03/2019   Right   JOINT REPLACEMENT     bilateral   KNEE SURGERY     Left Knee X 2   and Right knee X1   MALONEY DILATION N/A 09/28/2012   Procedure: MALONEY DILATION;  Surgeon: Danie Binder, MD;  [facility];  Service: Endoscopy;  Laterality: N/A;   NOSE SURGERY     PERIPHERAL VASCULAR INTERVENTION  08/16/2018   Procedure: PERIPHERAL VASCULAR INTERVENTION;  Surgeon: Serafina Mitchell, MD;  Location: [facility];  Service: Cardiovascular;;  LT Iliac   PORT-A-CATH REMOVAL Right 03/06/2020   Procedure: MINOR REMOVAL PORT-A-CATH;  Surgeon: [name], MD;  Location: [facility];  Service: General;  Laterality: Right;   PR VEIN BYPASS GRAFT,AORTO-FEM-POP  10/21/10   Left AK to BK popliteal BPG   PROSTATE BIOPSY  01/11/2012   Adenocarcinoma   PROSTATE SURGERY  2015  Chemo and  Radiation   SAVORY DILATION N/A 09/28/2012   Procedure: SAVORY DILATION;  Surgeon: Danie Binder, MD;  [facility];  Service: Endoscopy;  Laterality: N/A;   throat biopsy  03/20/2019   TONSILLECTOMY Left 04/03/2019   Procedure: TONSILLECTOMY;  Surgeon: Leta Baptist, MD;  Location: [facility];  Service: ENT;  Laterality: Left;    Family History  Problem Relation Age of Onset   Heart disease Mother        Valve regurgitation and Pacemaker    Diabetes Mother    Hyperlipidemia Mother    Heart disease Father        CABG x 5   Hyperlipidemia Father    Hypertension Father    Heart attack Father    Heart disease Sister        aortic valve replacement    Cancer Sister 41       Colon cancer w/ metastasis   Throat cancer Sister    Hypertension Brother    Heart disease Maternal Grandmother    Heart disease Maternal Grandfather    Heart disease Paternal Grandmother    Cancer Paternal Grandfather    Healthy Son    Healthy Daughter     Social History   Socioeconomic History   Marital status: Married    Spouse name: Engineer, maintenance   Number of children: 2   Years of education: Secretary/administrator   Highest education level: Not on file  Occupational History    Employer: [employer]    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   Caffeine use: very little   Social Determinants of Radio broadcast assistant Strain: Low Risk    Difficulty of Paying Living Expenses: Not hard at all  Food Insecurity: No Food Insecurity   Worried About Charity fundraiser in the Last Year: Never true   Arboriculturist in the Last Year: Never true  Transportation Needs: No Transportation Needs   Lack of Transportation (Medical): No   Lack of Transportation (Non-Medical): No  Physical Activity: Insufficiently Active   Days of Exercise per Week: 3 days   Minutes of Exercise per Session: 30 min  Stress: No Stress Concern Present   Feeling of Stress : Not at all  Social Connections: Moderately Isolated   Frequency of Communication with Friends and Family: Once a week   Frequency of Social Gatherings with Friends and Family: Once a week   Attends Religious Services: More than 4 times per year    Active Member of [employer] or Organizations: No   Attends Archivist Meetings: Never   Marital Status: Married  Human resources officer Violence: Not At Risk   Fear of Current or Ex-Partner: No   Emotionally Abused: No   Physically Abused: No   Sexually Abused: No    Outpatient Medications  Prior to Visit  Medication Sig Dispense Refill   aspirin EC 81 MG tablet Take 81 mg by mouth daily.     calcium carbonate (OS-CAL) 600 MG tablet Take 1 tablet (600 mg total) by mouth 2 (two) times daily with a meal. (Patient taking differently: Take 600 mg by mouth 2 (two) times daily.) 30 tablet    Cholecalciferol (VITAMIN D) 50 MCG (2000 UT) tablet Take 2,000 Units by mouth daily.     clopidogrel (PLAVIX) 75 MG tablet TAKE 1 TABLET BY MOUTH ONCE A DAY. 30 tablet 0   cyanocobalamin 1000 MCG tablet Take 1,000 mcg by mouth 2 (two) times daily.     famotidine (PEPCID) 20 MG tablet Take 1 tablet (20 mg total) by mouth at bedtime. 90 tablet 1   Ferrous Sulfate (IRON) 325 (65 Fe) MG TABS 1 tablet daily 30 tablet 0   gabapentin (NEURONTIN) 300 MG capsule Take 1 capsule (300 mg total) by mouth 2 (two) times daily. 180 capsule 1   HYDROcodone-acetaminophen (NORCO) 10-325 MG tablet TAKE 1 TABLET BY MOUTH EVERY FOUR HOURS AS NEEDED. 120 tablet 0   magnesium oxide (MAG-OX) 400 (241.3 Mg) MG tablet Take 1 tablet (400 mg total) by mouth 2 (two) times daily. 60 tablet 1   pantoprazole (PROTONIX) 40 MG tablet Take 1 tablet (40 mg total) by mouth daily. 90 tablet 1   simvastatin (ZOCOR) 40 MG tablet Take 1 tablet (40 mg total) by mouth at bedtime. 90 tablet 1   rOPINIRole (REQUIP) 3 MG tablet Take 3 mg by mouth at bedtime.     No facility-administered medications prior to visit.    Allergies  Allergen Reactions   Codeine Other (See Comments)    Bad headache and sweats   Morphine And Related Itching    sweats   Percocet [Oxycodone-Acetaminophen] Other (See Comments)    Headache and sweats    Review of Systems   Constitutional:  Negative for chills and fever.  HENT:  Negative for congestion and sore throat.   Eyes:  Positive for visual disturbance. Negative for pain.  Respiratory:  Negative for cough and shortness of breath.   Cardiovascular:  Negative for chest pain and palpitations.  Gastrointestinal:  Negative for constipation, diarrhea, nausea and vomiting.  Endocrine: Negative for polydipsia and polyuria.  Genitourinary:  Negative for dysuria and hematuria.  Musculoskeletal:  Positive for arthralgias and back pain. Negative for neck pain and neck stiffness.  Skin:  Negative for rash.  Neurological:  Positive for headaches. Negative for dizziness, weakness and numbness.  Psychiatric/Behavioral:  Negative for agitation and behavioral problems.       Objective:    Physical Exam Vitals reviewed.  Constitutional:      General: He is not in acute distress.    Appearance: He is not diaphoretic.  HENT:     Head: Normocephalic and atraumatic.     Nose: Nose normal.     Mouth/Throat:     Mouth: Mucous membranes are moist.  Eyes:     General: No scleral icterus.    Extraocular Movements: Extraocular movements intact.  Cardiovascular:     Rate and Rhythm: Normal rate and regular rhythm.     Pulses: Normal pulses.     Heart sounds: Normal heart sounds. No murmur heard. Pulmonary:     Breath sounds: Normal breath sounds. No wheezing or rales.  Musculoskeletal:     Cervical back: Neck supple. No tenderness.     Right lower leg: No edema.  Left lower leg: No edema.  Skin:    General: Skin is warm.     Findings: No rash.  Neurological:     General: No focal deficit present.     Mental Status: He is alert and oriented to person, place, and time.     Sensory: No sensory deficit.     Motor: No weakness.  Psychiatric:        Mood and Affect: Mood normal.        Behavior: Behavior normal.    BP 130/78 (BP Location: Left Arm, Patient Position: Sitting, Cuff Size: Normal)   Pulse 81    Resp 18   Ht 5\' 8"  (1.727 m)   Wt 160 lb 1.9 oz (72.6 kg)   SpO2 96%   BMI 24.35 kg/m  Wt Readings from Last 3 Encounters:  12/30/20 160 lb 1.9 oz (72.6 kg)  10/15/20 162 lb (73.5 kg)  08/01/20 150 lb 6.4 oz (68.2 kg)        Assessment & Plan:   Problem List Items Addressed This Visit       Cardiovascular and Mediastinum   Migraines - Primary    H/o migraine, uncontrolled with Tylenol PRN Sumatriptan PRN Referred to Neurology      Relevant Medications   SUMAtriptan (IMITREX) 50 MG tablet   Other Relevant Orders   Ambulatory referral to Neurology     Other   RLS (restless legs syndrome)    Uncontrolled with Ropinirole, increased dose to 4 mg On Gabapentin 300 mg BID Takes iron supplements as well Referred to Neurology      Relevant Medications   rOPINIRole (REQUIP) 4 MG tablet   Other Relevant Orders   Ambulatory referral to Neurology     Meds ordered this encounter  Medications   rOPINIRole (REQUIP) 4 MG tablet    Sig: Take 1 tablet (4 mg total) by mouth at bedtime.    Dispense:  30 tablet    Refill:  2   SUMAtriptan (IMITREX) 50 MG tablet    Sig: Take 1 tablet (50 mg total) by mouth every 2 (two) hours as needed for migraine. May repeat in 2 hours if headache persists or recurs.    Dispense:  10 tablet    Refill:  1     Gavon Majano Keith Rake, MD

## 2020-12-30 NOTE — Assessment & Plan Note (Signed)
H/o migraine, uncontrolled with Tylenol PRN Sumatriptan PRN Referred to Neurology

## 2020-12-30 NOTE — Patient Instructions (Signed)
Please start taking Ropinirole 4 mg instead of 3 mg.  Please take Sumatriptan for headache as instructed. Avoid daily dosing.

## 2020-12-31 ENCOUNTER — Ambulatory Visit: Payer: Medicare Other | Admitting: Internal Medicine

## 2021-01-06 ENCOUNTER — Encounter: Payer: Self-pay | Admitting: Neurology

## 2021-01-10 ENCOUNTER — Other Ambulatory Visit: Payer: Self-pay | Admitting: Nurse Practitioner

## 2021-01-13 ENCOUNTER — Other Ambulatory Visit: Payer: Self-pay | Admitting: Internal Medicine

## 2021-01-13 ENCOUNTER — Telehealth: Payer: Self-pay | Admitting: Internal Medicine

## 2021-01-13 DIAGNOSIS — G43709 Chronic migraine without aura, not intractable, without status migrainosus: Secondary | ICD-10-CM

## 2021-01-13 MED ORDER — SUMATRIPTAN SUCCINATE 50 MG PO TABS: 50.0000 mg | ORAL_TABLET | ORAL | 1 refills | Status: DC | PRN

## 2021-01-13 NOTE — Telephone Encounter (Signed)
Pt advised with verbal understanding  °

## 2021-01-13 NOTE — Telephone Encounter (Signed)
Pt came by office  in regards to refills on   SUMATRIPTAN 50 MG   Pharm will not refill because it is too soon   Pt states that he take med every night for his migraines   Pt would like have more than 10 tabs due to the fact he takes them every night .

## 2021-01-30 ENCOUNTER — Ambulatory Visit (HOSPITAL_COMMUNITY): Payer: Medicare Other

## 2021-01-30 ENCOUNTER — Inpatient Hospital Stay (HOSPITAL_COMMUNITY): Payer: Medicare Other

## 2021-02-06 ENCOUNTER — Ambulatory Visit (HOSPITAL_COMMUNITY): Payer: Medicare Other | Admitting: Hematology

## 2021-02-11 ENCOUNTER — Other Ambulatory Visit: Payer: Self-pay | Admitting: Internal Medicine

## 2021-02-13 ENCOUNTER — Ambulatory Visit (INDEPENDENT_AMBULATORY_CARE_PROVIDER_SITE_OTHER): Payer: Medicare Other | Admitting: Internal Medicine

## 2021-02-13 ENCOUNTER — Other Ambulatory Visit: Payer: Self-pay

## 2021-02-13 ENCOUNTER — Other Ambulatory Visit: Payer: Self-pay | Admitting: *Deleted

## 2021-02-13 ENCOUNTER — Encounter: Payer: Self-pay | Admitting: Internal Medicine

## 2021-02-13 VITALS — BP 118/78 | HR 46 | Resp 18 | Ht 68.0 in | Wt 169.0 lb

## 2021-02-13 DIAGNOSIS — G43709 Chronic migraine without aura, not intractable, without status migrainosus: Secondary | ICD-10-CM

## 2021-02-13 DIAGNOSIS — G2581 Restless legs syndrome: Secondary | ICD-10-CM | POA: Diagnosis not present

## 2021-02-13 DIAGNOSIS — Z23 Encounter for immunization: Secondary | ICD-10-CM | POA: Diagnosis not present

## 2021-02-13 DIAGNOSIS — G6289 Other specified polyneuropathies: Secondary | ICD-10-CM

## 2021-02-13 DIAGNOSIS — Z8546 Personal history of malignant neoplasm of prostate: Secondary | ICD-10-CM

## 2021-02-13 DIAGNOSIS — E782 Mixed hyperlipidemia: Secondary | ICD-10-CM

## 2021-02-13 DIAGNOSIS — Z0001 Encounter for general adult medical examination with abnormal findings: Secondary | ICD-10-CM

## 2021-02-13 DIAGNOSIS — R7303 Prediabetes: Secondary | ICD-10-CM

## 2021-02-13 MED ORDER — SIMVASTATIN 40 MG PO TABS: 40.0000 mg | ORAL_TABLET | Freq: Every day | ORAL | 1 refills | Status: AC

## 2021-02-13 MED ORDER — GABAPENTIN 300 MG PO CAPS: 300.0000 mg | ORAL_CAPSULE | Freq: Three times a day (TID) | ORAL | 1 refills | Status: DC

## 2021-02-13 MED ORDER — PROPRANOLOL HCL 20 MG PO TABS: 20.0000 mg | ORAL_TABLET | Freq: Two times a day (BID) | ORAL | 2 refills | Status: DC

## 2021-02-13 NOTE — Assessment & Plan Note (Signed)

## 2021-02-13 NOTE — Patient Instructions (Signed)
Please start taking Propranolol as prescribed.  Please start taking Gabapentin 300 mg 3 times in a day instead of twice daily.  Continue taking other medications as prescribed.  Please continue to follow heart healthy diet and perform moderate exercise/walking at least 150 mins/week.

## 2021-02-13 NOTE — Progress Notes (Signed)
Established Patient Office Visit  Subjective:  Patient ID: Cory Foley, male    DOB: 02-24-1950  Age: 70 y.o. MRN: [id number]  CC:  Chief Complaint  Patient presents with   Annual Exam    Annual exam pt is still having problems with restless legs and headache has appt with neurologist but cant be seen until 04-04-21    HPI Cory Foley is a 70 y.o. male with past medical history of palatine tonsil ca. s/p chemotherapy, prostate ca. s/p  radiotherapy, PVD s/p common iliac artery stent placement, osteoporosis, chronic low back pain, restless legs syndrome and insomnia who presents for annual physical.  His leg pain has improved slightly with increased dose of ropinirole for now.  He has been taking gabapentin for neuropathic pain as well.  He states that his leg pain and migraines are worse around 4 PM till midnight.  He does admit to having multiple coffee drinks in a day, but is willing to cut down.  He did have improvement in his headache with sumatriptan, but had to take it every third day to avoid overdosing on it.  He is awaiting Neurology visit.  He received flu vaccine in the office today.  Past Medical History:  Diagnosis Date   Allergy    Anxiety    Arthritis    BPH with obstruction/lower urinary tract symptoms    Cataract    COPD, mild (HCC)    current smoker   ED (erectile dysfunction)    GERD (gastroesophageal reflux disease)    Hyperlipidemia    Nocturia    Osteoporosis    PAD (peripheral artery disease) ([place])    Port-A-Cath in place 04/28/2019   Right   Prostate cancer ([place]) 01/11/2012   Adenocarcinoma, Gleason=4+4=8, & 4+5=9,PSA=17.24, Volume= 15.45cc   PVD (peripheral vascular disease) ([place])    Restless leg syndrome    Tongue mass    SCC    Past Surgical History:  Procedure Laterality Date   ABDOMINAL AORTOGRAM W/LOWER EXTREMITY N/A 08/16/2018   Procedure: ABDOMINAL AORTOGRAM W/LOWER EXTREMITY;  Surgeon: Serafina Mitchell, MD;  Location: [facility];  Service: Cardiovascular;  Laterality: N/A;  Bilateral   COLONOSCOPY N/A 09/28/2012   Procedure: COLONOSCOPY;  Surgeon: Danie Binder, MD;  [facility];  Service: Endoscopy;  Laterality: N/A;  8:30   DIRECT LARYNGOSCOPY N/A 04/03/2019   Procedure: DIRECT LARYNGOSCOPY;  Surgeon: Leta Baptist, MD;  Location: [facility];  Service: ENT;  Laterality: N/A;   ESOPHAGOGASTRODUODENOSCOPY N/A 09/28/2012   Procedure: ESOPHAGOGASTRODUODENOSCOPY (EGD);  Surgeon: Danie Binder, MD;  [facility];  Service: Endoscopy;  Laterality: N/A;   Gold seed implatation  04/28/2012   HERNIA REPAIR     Bilateral inguinal X2   IR IMAGING GUIDED PORT INSERTION  05/03/2019   Right   JOINT REPLACEMENT     bilateral   KNEE SURGERY     Left Knee X 2   and Right knee X1   MALONEY DILATION N/A 09/28/2012   Procedure: MALONEY DILATION;  Surgeon: Danie Binder, MD;  [facility];  Service: Endoscopy;  Laterality: N/A;   NOSE SURGERY     PERIPHERAL VASCULAR INTERVENTION  08/16/2018   Procedure: PERIPHERAL VASCULAR INTERVENTION;  Surgeon: Serafina Mitchell, MD;  Location: [facility];  Service: Cardiovascular;;  LT Iliac   PORT-A-CATH REMOVAL Right 03/06/2020   Procedure: MINOR REMOVAL PORT-A-CATH;  Surgeon: [name], MD;  [facility];  Service: General;  Laterality: Right;   PR VEIN BYPASS GRAFT,AORTO-FEM-POP  10/21/10   Left AK to BK popliteal BPG   PROSTATE BIOPSY  01/11/2012   Adenocarcinoma   PROSTATE SURGERY  2015   Chemo and  Radiation   SAVORY DILATION N/A 09/28/2012   Procedure: SAVORY DILATION;  Surgeon: Danie Binder, MD;  [facility];  Service: Endoscopy;  Laterality: N/A;   throat biopsy  03/20/2019   TONSILLECTOMY Left 04/03/2019   Procedure: TONSILLECTOMY;  Surgeon: Leta Baptist, MD;  Location: [facility];  Service: ENT;  Laterality: Left;    Family History  Problem Relation Age of Onset   Heart disease Mother         Valve regurgitation and Pacemaker    Diabetes Mother    Hyperlipidemia Mother    Heart disease Father        CABG x 5   Hyperlipidemia Father    Hypertension Father    Heart attack Father    Heart disease Sister        aortic valve replacement   Cancer Sister 34       Colon cancer w/ metastasis   Throat cancer Sister    Hypertension Brother    Heart disease Maternal Grandmother    Heart disease Maternal Grandfather    Heart disease Paternal Grandmother    Cancer Paternal Grandfather    Healthy Son    Healthy Daughter     Social History   Socioeconomic History   Marital status: Married    Spouse name: Engineer, maintenance   Number of children: 2   Years of education: Secretary/administrator   Highest education level: Not on file  Occupational History    Employer: [employer]    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   Caffeine use: very little   Social Determinants of Radio broadcast assistant Strain: Low Risk    Difficulty of Paying Living Expenses: Not hard at all  Food Insecurity: No Food Insecurity   Worried About Charity fundraiser in the Last Year: Never true   Arboriculturist in the Last Year: Never true  Transportation Needs: No Transportation Needs   Lack of Transportation (Medical): No   Lack of Transportation (Non-Medical): No  Physical Activity: Insufficiently Active   Days of Exercise per Week: 3 days   Minutes of Exercise per Session: 30 min  Stress: No Stress Concern Present   Feeling of Stress :  Not at all  Social Connections: Moderately Isolated   Frequency of Communication with Friends and Family: Once a week   Frequency of Social Gatherings with Friends and Family: Once a week   Attends Religious Services: More than 4 times per year   Active Member of [employer] or Organizations: No   Attends Archivist Meetings:  Never   Marital Status: Married  Human resources officer Violence: Not At Risk   Fear of Current or Ex-Partner: No   Emotionally Abused: No   Physically Abused: No   Sexually Abused: No    Outpatient Medications Prior to Visit  Medication Sig Dispense Refill   aspirin EC 81 MG tablet Take 81 mg by mouth daily.     calcium carbonate (OS-CAL) 600 MG tablet Take 1 tablet (600 mg total) by mouth 2 (two) times daily with a meal. (Patient taking differently: Take 600 mg by mouth 2 (two) times daily.) 30 tablet    Cholecalciferol (VITAMIN D) 50 MCG (2000 UT) tablet Take 2,000 Units by mouth daily.     clopidogrel (PLAVIX) 75 MG tablet TAKE 1 TABLET BY MOUTH ONCE A DAY. 30 tablet 0   cyanocobalamin 1000 MCG tablet Take 1,000 mcg by mouth 2 (two) times daily.     famotidine (PEPCID) 20 MG tablet Take 1 tablet (20 mg total) by mouth at bedtime. 90 tablet 1   Ferrous Sulfate (IRON) 325 (65 Fe) MG TABS 1 tablet daily 30 tablet 0   magnesium oxide (MAG-OX) 400 (241.3 Mg) MG tablet Take 1 tablet (400 mg total) by mouth 2 (two) times daily. 60 tablet 1   pantoprazole (PROTONIX) 40 MG tablet Take 1 tablet (40 mg total) by mouth daily. 90 tablet 1   rOPINIRole (REQUIP) 4 MG tablet Take 1 tablet (4 mg total) by mouth at bedtime. 30 tablet 2   SUMAtriptan (IMITREX) 50 MG tablet Take 1 tablet (50 mg total) by mouth every 2 (two) hours as needed for migraine. May repeat in 2 hours if headache persists or recurs. 10 tablet 1   gabapentin (NEURONTIN) 300 MG capsule Take 1 capsule (300 mg total) by mouth 2 (two) times daily. 180 capsule 1   simvastatin (ZOCOR) 40 MG tablet Take 1 tablet (40  mg total) by mouth at bedtime. 90 tablet 1   HYDROcodone-acetaminophen (NORCO) 10-325 MG tablet TAKE 1 TABLET BY MOUTH EVERY FOUR HOURS AS NEEDED. 120 tablet 0   No facility-administered medications prior to visit.    Allergies  Allergen Reactions   Codeine Other (See Comments)    Bad headache and sweats   Morphine And Related Itching    sweats   Percocet [Oxycodone-Acetaminophen] Other (See Comments)    Headache and sweats    ROS Review of Systems  Constitutional:  Negative for chills and fever.  HENT:  Negative for congestion and sore throat.   Eyes:  Negative for pain.  Respiratory:  Negative for cough and shortness of breath.   Cardiovascular:  Negative for chest pain and palpitations.  Gastrointestinal:  Negative for constipation, diarrhea, nausea and vomiting.  Endocrine: Negative for polydipsia and polyuria.  Genitourinary:  Negative for dysuria and hematuria.  Musculoskeletal:  Positive for arthralgias and back pain. Negative for neck pain and neck stiffness.  Skin:  Negative for rash.  Neurological:  Positive for headaches. Negative for dizziness, weakness and numbness.  Psychiatric/Behavioral:  Negative for agitation and behavioral problems.      Objective:    Physical Exam Vitals reviewed.  Constitutional:      General: He is not in acute distress.    Appearance: He is not diaphoretic.  HENT:     Head: Normocephalic and atraumatic.     Nose: Nose normal.     Mouth/Throat:     Mouth: Mucous membranes are moist.  Eyes:     General: No scleral icterus.  Extraocular Movements: Extraocular movements intact.  Cardiovascular:     Rate and Rhythm: Normal rate and regular rhythm.     Pulses: Normal pulses.     Heart sounds: Normal heart sounds. No murmur heard. Pulmonary:     Breath sounds: Normal breath sounds. No wheezing or rales.  Abdominal:     Palpations: Abdomen is soft.     Tenderness: There is no abdominal tenderness.  Musculoskeletal:      Cervical back: Neck supple. No tenderness.     Right lower leg: No edema.     Left lower leg: No edema.  Skin:    General: Skin is warm.     Findings: No rash.  Neurological:     General: No focal deficit present.     Mental Status: He is alert and oriented to person, place, and time.     Cranial Nerves: No cranial nerve deficit.     Sensory: No sensory deficit.     Motor: No weakness.  Psychiatric:        Mood and Affect: Mood normal.        Behavior: Behavior normal.    BP 118/78 (BP Location: Right Arm, Patient Position: Sitting, Cuff Size: Normal)    Pulse (!) 46    Resp 18    Ht 5\' 8"  (1.727 m)    Wt 169 lb 0.6 oz (76.7 kg)    SpO2 98%    BMI 25.70 kg/m  Wt Readings from Last 3 Encounters:  02/13/21 169 lb 0.6 oz (76.7 kg)  12/30/20 160 lb 1.9 oz (72.6 kg)  10/15/20 162 lb (73.5 kg)    Lab Results  Component Value Date   TSH 1.982 07/25/2020   Lab Results  Component Value Date   WBC 3.7 (L) 07/25/2020   HGB 12.2 (L) 12/26/2020   HCT 36.0 (L) 12/26/2020   MCV 107.6 (H) 07/25/2020   PLT 166 07/25/2020   Lab Results  Component Value Date   NA 141 12/26/2020   K 4.0 12/26/2020   CO2 29 07/25/2020   GLUCOSE 94 12/26/2020   BUN 9 12/26/2020   CREATININE 1.10 12/26/2020   BILITOT 0.4 07/25/2020   ALKPHOS 51 07/25/2020   AST 15 07/25/2020   ALT 12 07/25/2020   PROT 6.6 07/25/2020   ALBUMIN 3.8 07/25/2020   CALCIUM 9.1 07/25/2020   ANIONGAP 6 07/25/2020   Lab Results  Component Value Date   CHOL 185 04/08/2020   Lab Results  Component Value Date   HDL 58 04/08/2020   Lab Results  Component Value Date   LDLCALC 106 (H) 04/08/2020   Lab Results  Component Value Date   TRIG 112 04/08/2020   Lab Results  Component Value Date   CHOLHDL 3.2 04/08/2020   Lab Results  Component Value Date   HGBA1C 5.5 03/13/2016      Assessment & Plan:   Problem List Items Addressed This Visit    Encounter for general adult medical examination with abnormal  findings Physical exam as documented. Counseling done  re healthy lifestyle involving commitment to 150 minutes exercise per week, heart healthy diet, and attaining healthy weight.The importance of adequate sleep also discussed. Changes in health habits are decided on by the patient with goals and time frames  set for achieving them. Immunization and cancer screening needs are specifically addressed at this visit.  Migraines Better with sumatriptan as needed, but has required it multiple times in a week Started propranolol as ppx Continue sumatriptan  as needed Follow-up with neurology  Hyperlipidemia On statin Check lipid profile  RLS (restless legs syndrome) Slightly better with increased dose of ropinirole Increased gabapentin to 300 mg 3 times daily Follow-up with neurology  Other Visit Diagnoses     Need for immunization against influenza       Relevant Orders   Flu Vaccine QUAD High Dose(Fluad) (Completed)   History of prostate cancer       Relevant Orders   PSA   Prediabetes       Relevant Orders   Hemoglobin A1c       Meds ordered this encounter  Medications   gabapentin (NEURONTIN) 300 MG capsule    Sig: Take 1 capsule (300 mg total) by mouth 3 (three) times daily.    Dispense:  270 capsule    Refill:  1    Frequency change   propranolol (INDERAL) 20 MG tablet    Sig: Take 1 tablet (20 mg total) by mouth 2 (two) times daily.    Dispense:  60 tablet    Refill:  2    Follow-up: Return in about 6 months (around 08/14/2021) for Migraine and RLS.    Lindell Spar, MD

## 2021-02-13 NOTE — Assessment & Plan Note (Signed)
Better with sumatriptan as needed, but has required it multiple times in a week Started propranolol as ppx Continue sumatriptan as needed Follow-up with neurology

## 2021-02-13 NOTE — Assessment & Plan Note (Addendum)
Slightly better with increased dose of ropinirole Increased gabapentin to 300 mg 3 times daily Follow-up with neurology

## 2021-02-13 NOTE — Assessment & Plan Note (Signed)
On statin Check lipid profile 

## 2021-03-14 ENCOUNTER — Other Ambulatory Visit: Payer: Self-pay | Admitting: Internal Medicine

## 2021-03-14 DIAGNOSIS — G43709 Chronic migraine without aura, not intractable, without status migrainosus: Secondary | ICD-10-CM

## 2021-03-25 ENCOUNTER — Other Ambulatory Visit: Payer: Self-pay

## 2021-03-25 ENCOUNTER — Other Ambulatory Visit (HOSPITAL_COMMUNITY)
Admission: RE | Admit: 2021-03-25 | Discharge: 2021-03-25 | Disposition: A | Discharge status: Home / Self Care | Payer: Medicare Other | Source: Ambulatory Visit | Attending: Internal Medicine | Admitting: Internal Medicine

## 2021-03-25 ENCOUNTER — Ambulatory Visit (HOSPITAL_COMMUNITY)
Admission: RE | Admit: 2021-03-25 | Discharge: 2021-03-25 | Disposition: A | Discharge status: Home / Self Care | Payer: Medicare Other | Source: Ambulatory Visit | Attending: Hematology | Admitting: Hematology

## 2021-03-25 ENCOUNTER — Inpatient Hospital Stay (HOSPITAL_COMMUNITY): Payer: Medicare Other | Attending: Hematology

## 2021-03-25 DIAGNOSIS — I6529 Occlusion and stenosis of unspecified carotid artery: Secondary | ICD-10-CM | POA: Diagnosis not present

## 2021-03-25 DIAGNOSIS — C099 Malignant neoplasm of tonsil, unspecified: Secondary | ICD-10-CM

## 2021-03-25 DIAGNOSIS — E785 Hyperlipidemia, unspecified: Secondary | ICD-10-CM | POA: Diagnosis not present

## 2021-03-25 DIAGNOSIS — R7303 Prediabetes: Secondary | ICD-10-CM | POA: Insufficient documentation

## 2021-03-25 DIAGNOSIS — Z9221 Personal history of antineoplastic chemotherapy: Secondary | ICD-10-CM | POA: Insufficient documentation

## 2021-03-25 DIAGNOSIS — Z8546 Personal history of malignant neoplasm of prostate: Secondary | ICD-10-CM | POA: Insufficient documentation

## 2021-03-25 DIAGNOSIS — Z85818 Personal history of malignant neoplasm of other sites of lip, oral cavity, and pharynx: Secondary | ICD-10-CM | POA: Diagnosis not present

## 2021-03-25 DIAGNOSIS — Z923 Personal history of irradiation: Secondary | ICD-10-CM | POA: Diagnosis not present

## 2021-03-25 DIAGNOSIS — R221 Localized swelling, mass and lump, neck: Secondary | ICD-10-CM | POA: Diagnosis not present

## 2021-03-25 DIAGNOSIS — E782 Mixed hyperlipidemia: Secondary | ICD-10-CM | POA: Insufficient documentation

## 2021-03-25 DIAGNOSIS — M47812 Spondylosis without myelopathy or radiculopathy, cervical region: Secondary | ICD-10-CM | POA: Diagnosis not present

## 2021-03-25 LAB — CBC WITH DIFFERENTIAL/PLATELET
Abs Immature Granulocytes: 0.02 10*3/uL (ref 0.00–0.07)
Basophils Absolute: 0 10*3/uL (ref 0.0–0.1)
Basophils Relative: 1 %
Eosinophils Absolute: 0.2 10*3/uL (ref 0.0–0.5)
Eosinophils Relative: 4 %
HCT: 39.5 % (ref 39.0–52.0)
Hemoglobin: 12.5 g/dL — ABNORMAL LOW (ref 13.0–17.0)
Immature Granulocytes: 1 %
Lymphocytes Relative: 25 %
Lymphs Abs: 1.1 10*3/uL (ref 0.7–4.0)
MCH: 33.5 pg (ref 26.0–34.0)
MCHC: 31.6 g/dL (ref 30.0–36.0)
MCV: 105.9 fL — ABNORMAL HIGH (ref 80.0–100.0)
Monocytes Absolute: 0.3 10*3/uL (ref 0.1–1.0)
Monocytes Relative: 7 %
Neutro Abs: 2.8 10*3/uL (ref 1.7–7.7)
Neutrophils Relative %: 62 %
Platelets: 198 10*3/uL (ref 150–400)
RBC: 3.73 MIL/uL — ABNORMAL LOW (ref 4.22–5.81)
RDW: 14.6 % (ref 11.5–15.5)
WBC: 4.4 10*3/uL (ref 4.0–10.5)
nRBC: 0 % (ref 0.0–0.2)

## 2021-03-25 LAB — TSH: TSH: 2.561 u[IU]/mL (ref 0.350–4.500)

## 2021-03-25 LAB — COMPREHENSIVE METABOLIC PANEL
ALT: 11 U/L (ref 0–44)
AST: 14 U/L — ABNORMAL LOW (ref 15–41)
Albumin: 4 g/dL (ref 3.5–5.0)
Alkaline Phosphatase: 52 U/L (ref 38–126)
Anion gap: 6 (ref 5–15)
BUN: 13 mg/dL (ref 8–23)
CO2: 27 mmol/L (ref 22–32)
Calcium: 9.2 mg/dL (ref 8.9–10.3)
Chloride: 104 mmol/L (ref 98–111)
Creatinine, Ser: 1.11 mg/dL (ref 0.61–1.24)
GFR, Estimated: >60 mL/min (ref >60)
Glucose, Bld: 96 mg/dL (ref 70–99)
Potassium: 4.3 mmol/L (ref 3.5–5.1)
Sodium: 137 mmol/L (ref 135–145)
Total Bilirubin: 0.5 mg/dL (ref 0.3–1.2)
Total Protein: 6.9 g/dL (ref 6.5–8.1)

## 2021-03-25 LAB — HEMOGLOBIN A1C
Hgb A1c MFr Bld: 5.8 % — ABNORMAL HIGH (ref 4.8–5.6)
Mean Plasma Glucose: 119.76 mg/dL

## 2021-03-25 LAB — LIPID PANEL
Cholesterol: 172 mg/dL (ref 0–200)
HDL: 54 mg/dL (ref >40)
LDL Cholesterol: 99 mg/dL (ref 0–99)
Total CHOL/HDL Ratio: 3.2 RATIO
Triglycerides: 93 mg/dL (ref <150)
VLDL: 19 mg/dL (ref 0–40)

## 2021-03-25 LAB — PSA: Prostatic Specific Antigen: 0.01 ng/mL (ref 0.00–4.00)

## 2021-03-25 MED ORDER — IOHEXOL 300 MG/ML  SOLN
100.0000 mL | Freq: Once | INTRAMUSCULAR | Status: AC | PRN
  Administered 2021-03-25: 75 mL via INTRAVENOUS

## 2021-03-27 ENCOUNTER — Other Ambulatory Visit: Payer: Self-pay | Admitting: Internal Medicine

## 2021-03-27 DIAGNOSIS — G2581 Restless legs syndrome: Secondary | ICD-10-CM

## 2021-04-01 ENCOUNTER — Inpatient Hospital Stay (HOSPITAL_COMMUNITY): Payer: Medicare Other | Attending: Hematology | Admitting: Hematology

## 2021-04-01 ENCOUNTER — Other Ambulatory Visit: Payer: Self-pay | Admitting: Internal Medicine

## 2021-04-01 ENCOUNTER — Other Ambulatory Visit: Payer: Self-pay

## 2021-04-01 VITALS — BP 113/78 | HR 74 | Temp 98.4°F | Resp 18 | Ht 68.0 in | Wt 170.5 lb

## 2021-04-01 DIAGNOSIS — F17210 Nicotine dependence, cigarettes, uncomplicated: Secondary | ICD-10-CM | POA: Insufficient documentation

## 2021-04-01 DIAGNOSIS — C099 Malignant neoplasm of tonsil, unspecified: Secondary | ICD-10-CM | POA: Diagnosis not present

## 2021-04-01 DIAGNOSIS — E538 Deficiency of other specified B group vitamins: Secondary | ICD-10-CM | POA: Diagnosis not present

## 2021-04-01 DIAGNOSIS — Z85818 Personal history of malignant neoplasm of other sites of lip, oral cavity, and pharynx: Secondary | ICD-10-CM | POA: Diagnosis not present

## 2021-04-01 DIAGNOSIS — D649 Anemia, unspecified: Secondary | ICD-10-CM | POA: Diagnosis not present

## 2021-04-01 DIAGNOSIS — Z9221 Personal history of antineoplastic chemotherapy: Secondary | ICD-10-CM | POA: Insufficient documentation

## 2021-04-01 DIAGNOSIS — Z8546 Personal history of malignant neoplasm of prostate: Secondary | ICD-10-CM | POA: Diagnosis present

## 2021-04-01 DIAGNOSIS — Z923 Personal history of irradiation: Secondary | ICD-10-CM | POA: Diagnosis not present

## 2021-04-01 DIAGNOSIS — N289 Disorder of kidney and ureter, unspecified: Secondary | ICD-10-CM | POA: Insufficient documentation

## 2021-04-01 NOTE — Progress Notes (Signed)
Cory Foley,  [id number]   CLINIC:  Medical Oncology/Hematology  PCP:  Lindell Spar, MD [address] 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  PRIOR THERAPY: Cisplatin x 6 cycles from 05/08/2019 to 06/22/2019  NGS Results: not done  CURRENT THERAPY: surveillance  BRIEF ONCOLOGIC HISTORY:  Oncology History  Malignant neoplasm of palatine tonsil ([place])  04/07/2019 Initial Diagnosis   Tonsillar cancer ([place])   05/08/2019 - 06/22/2019 Chemotherapy   Patient is on Treatment Plan : HEAD/NECK Cisplatin q7d       CANCER STAGING:  Cancer Staging  Malignant neoplasm of palatine tonsil ([place]) Staging form: Pharynx - HPV-Mediated Oropharynx, AJCC 8th Edition - Clinical stage from 04/07/2019: Stage I (cT2, cN1, cM0, p16+) - Unsigned   INTERVAL HISTORY:  Cory Foley, a 71 y.o. male, returns for routine follow-up of his left tonsillar squamous cell carcinoma. Cory Foley was last seen on 01/25/2020.   Today Cory Foley reports feeling good. Cory Foley denies difficulty swallowing.   REVIEW OF SYSTEMS:  Review of Systems  Constitutional:  Negative for appetite change and fatigue.  HENT:   Negative for trouble swallowing.   Gastrointestinal:  Positive for diarrhea.  Neurological:  Positive for headaches.  Psychiatric/Behavioral:  Positive for sleep disturbance.   All other systems reviewed and are negative.  PAST MEDICAL/SURGICAL HISTORY:  Past Medical History:  Diagnosis Date   Allergy    Anxiety    Arthritis    BPH with obstruction/lower urinary tract symptoms    Cataract    COPD, mild (HCC)    current smoker   ED (erectile dysfunction)    GERD (gastroesophageal reflux disease)    Hyperlipidemia    Nocturia    Osteoporosis    PAD (peripheral artery disease) ([place])    Port-A-Cath in place 04/28/2019   Right   Prostate cancer ([place]) 01/11/2012   Adenocarcinoma,  Gleason=4+4=8, & 4+5=9,PSA=17.24, Volume= 15.45cc   PVD (peripheral vascular disease) ([place])    Restless leg syndrome    Tongue mass    SCC   Past Surgical History:  Procedure Laterality Date   ABDOMINAL AORTOGRAM W/LOWER EXTREMITY N/A 08/16/2018   Procedure: ABDOMINAL AORTOGRAM W/LOWER EXTREMITY;  Surgeon: Serafina Mitchell, MD;  Location: [facility];  Service: Cardiovascular;  Laterality: N/A;  Bilateral   COLONOSCOPY N/A 09/28/2012   Procedure: COLONOSCOPY;  Surgeon: Danie Binder, MD;  [facility];  Service: Endoscopy;  Laterality: N/A;  8:30   DIRECT LARYNGOSCOPY N/A 04/03/2019   Procedure: DIRECT LARYNGOSCOPY;  Surgeon: Leta Baptist, MD;  Location: [facility];  Service: ENT;  Laterality: N/A;   ESOPHAGOGASTRODUODENOSCOPY N/A 09/28/2012   Procedure: ESOPHAGOGASTRODUODENOSCOPY (EGD);  Surgeon: Danie Binder, MD;  [facility];  Service: Endoscopy;  Laterality: N/A;   Gold seed implatation  04/28/2012   HERNIA REPAIR     Bilateral inguinal X2   IR IMAGING GUIDED PORT INSERTION  05/03/2019   Right   JOINT REPLACEMENT     bilateral   KNEE SURGERY     Left Knee X 2   and Right knee X1   MALONEY DILATION N/A 09/28/2012   Procedure: MALONEY DILATION;  Surgeon: Danie Binder, MD;  [facility];  Service: Endoscopy;  Laterality: N/A;   NOSE SURGERY     PERIPHERAL VASCULAR INTERVENTION  08/16/2018   Procedure: PERIPHERAL VASCULAR INTERVENTION;  Surgeon: Serafina Mitchell, MD;  Location: [facility];  Service: Cardiovascular;;  LT Iliac   PORT-A-CATH REMOVAL Right 03/06/2020   Procedure: MINOR REMOVAL PORT-A-CATH;  Surgeon: [name], MD;  Location: [facility];  Service: General;  Laterality: Right;   PR VEIN BYPASS GRAFT,AORTO-FEM-POP  10/21/10   Left AK to BK popliteal BPG   PROSTATE BIOPSY  01/11/2012   Adenocarcinoma   PROSTATE SURGERY  2015   Chemo and  Radiation   SAVORY DILATION N/A 09/28/2012   Procedure: SAVORY DILATION;   Surgeon: Danie Binder, MD;  [facility];  Service: Endoscopy;  Laterality: N/A;   throat biopsy  03/20/2019   TONSILLECTOMY Left 04/03/2019   Procedure: TONSILLECTOMY;  Surgeon: Leta Baptist, MD;  Location: [facility];  Service: ENT;  Laterality: Left;    SOCIAL HISTORY:  Social History   Socioeconomic History   Marital status: Married    Spouse name: Glenda   Number of children: 2   Years of education: [employer] education level: Not on file  Occupational History    Employer: [employer]    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   Caffeine use: very little   Social Determinants of Radio broadcast assistant Strain: Low Risk    Difficulty of Paying Living Expenses: Not hard at all  Food Insecurity: No Food Insecurity   Worried About Charity fundraiser in the Last Year: Never true   Arboriculturist in the Last Year: Never true  Transportation Needs: No Transportation Needs   Lack of Transportation (Medical): No   Lack of Transportation (Non-Medical): No  Physical Activity: Insufficiently Active   Days of Exercise per Week: 3 days   Minutes of Exercise per Session: 30 min  Stress: No Stress Concern Present   Feeling of Stress : Not at all  Social Connections: Moderately Isolated   Frequency of Communication with Friends and Family: Once a week   Frequency of Social Gatherings with Friends and Family: Once a week   Attends Religious Services: More than 4 times per  year   Active Member of [employer] or Organizations: No   Attends Music therapist: Never   Marital Status: Married  Human resources officer Violence: Not At Risk   Fear of Current or Ex-Partner: No   Emotionally Abused: No   Physically Abused: No   Sexually Abused: No    FAMILY HISTORY:  Family History  Problem Relation Age of Onset   Heart disease Mother        Valve regurgitation and Pacemaker    Diabetes Mother    Hyperlipidemia Mother    Heart disease Father        CABG x 5   Hyperlipidemia Father    Hypertension Father    Heart attack Father  Heart disease Sister        aortic valve replacement   Cancer Sister 63       Colon cancer w/ metastasis   Throat cancer Sister    Hypertension Brother    Heart disease Maternal Grandmother    Heart disease Maternal Grandfather    Heart disease Paternal Grandmother    Cancer Paternal Grandfather    Healthy Son    Healthy Daughter     CURRENT MEDICATIONS:  Current Outpatient Medications  Medication Sig Dispense Refill   aspirin EC 81 MG tablet Take 81 mg by mouth daily.     calcium carbonate (OS-CAL) 600 MG tablet Take 1 tablet (600 mg total) by mouth 2 (two) times daily with a meal. (Patient taking differently: Take 600 mg by mouth 2 (two) times daily.) 30 tablet    Cholecalciferol (VITAMIN D) 50 MCG (2000 UT) tablet Take 2,000 Units by mouth daily.     clopidogrel (PLAVIX) 75 MG tablet TAKE 1 TABLET BY MOUTH ONCE A DAY. 30 tablet 0   cyanocobalamin 1000 MCG tablet Take 1,000 mcg by mouth 2 (two) times daily.     famotidine (PEPCID) 20 MG tablet Take 1 tablet (20 mg total) by mouth at bedtime. 90 tablet 1   Ferrous Sulfate (IRON) 325 (65 Fe) MG TABS 1 tablet daily 30 tablet 0   gabapentin (NEURONTIN) 300 MG capsule Take 1 capsule (300 mg total) by mouth 3 (three) times daily. 270 capsule 1   magnesium oxide (MAG-OX) 400 (241.3 Mg) MG tablet Take 1 tablet (400 mg total) by mouth 2 (two) times daily. 60 tablet 1    pantoprazole (PROTONIX) 40 MG tablet TAKE ONE TABLET BY MOUTH DAILY. 90 tablet 0   propranolol (INDERAL) 20 MG tablet Take 1 tablet (20 mg total) by mouth 2 (two) times daily. 60 tablet 2   rOPINIRole (REQUIP) 4 MG tablet TAKE 1 TABLET BY MOUTH AT BEDTIME 30 tablet 0   simvastatin (ZOCOR) 40 MG tablet Take 1 tablet (40 mg total) by mouth at bedtime. 90 tablet 1   SUMAtriptan (IMITREX) 50 MG tablet TAKE 1 TABLET BY MOUTH EVERY 2 HOURS AS NEEDED FOR MIGRAINE. MAY REPEAT IN 2 HOURS IF HEADACHE PERSISTS OR RECURS 10 tablet 0   No current facility-administered medications for this visit.    ALLERGIES:  Allergies  Allergen Reactions   Codeine Other (See Comments)    Bad headache and sweats   Morphine And Related Itching    sweats   Percocet [Oxycodone-Acetaminophen] Other (See Comments)    Headache and sweats    PHYSICAL EXAM:  Performance status (ECOG): 1 - Symptomatic but completely ambulatory  Vitals:   04/01/21 1153  BP: 113/78  Pulse: 74  Resp: 18  Temp: 98.4 F (36.9 C)  SpO2: 99%   Wt Readings from Last 3 Encounters:  04/01/21 170 lb 8 oz (77.3 kg)  02/13/21 169 lb 0.6 oz (76.7 kg)  12/30/20 160 lb 1.9 oz (72.6 kg)   Physical Exam Vitals reviewed.  Constitutional:      Appearance: Normal appearance.  HENT:     Mouth/Throat:     Comments: 5 mm raised plaque at L tonsillar pillar  5 mm plaque in floor of the mouth  Cardiovascular:     Rate and Rhythm: Normal rate and regular rhythm.     Pulses: Normal pulses.     Heart sounds: Normal heart sounds.  Pulmonary:     Effort: Pulmonary effort is normal.  Breath sounds: Normal breath sounds.  Neurological:     General: No focal deficit present.     Mental Status: Cory Foley is alert and oriented to person, place, and time.  Psychiatric:        Mood and Affect: Mood normal.        Behavior: Behavior normal.     LABORATORY DATA:  I have reviewed the labs as listed.  CBC Latest Ref Rng & Units 03/25/2021 12/26/2020  07/25/2020  WBC 4.0 - 10.5 K/uL 4.4 - 3.7(L)  Hemoglobin 13.0 - 17.0 g/dL 12.5(L) 12.2(L) 12.1(L)  Hematocrit 39.0 - 52.0 % 39.5 36.0(L) 38.4(L)  Platelets 150 - 400 K/uL 198 - 166   CMP Latest Ref Rng & Units 03/25/2021 12/26/2020 07/25/2020  Glucose 70 - 99 mg/dL 96 94 97  BUN 8 - 23 mg/dL 13 9 12   Creatinine 0.61 - 1.24 mg/dL 1.11 1.10 1.11  Sodium 135 - 145 mmol/L 137 141 138  Potassium 3.5 - 5.1 mmol/L 4.3 4.0 4.5  Chloride 98 - 111 mmol/L 104 103 103  CO2 22 - 32 mmol/L 27 - 29  Calcium 8.9 - 10.3 mg/dL 9.2 - 9.1  Total Protein 6.5 - 8.1 g/dL 6.9 - 6.6  Total Bilirubin 0.3 - 1.2 mg/dL 0.5 - 0.4  Alkaline Phos 38 - 126 U/L 52 - 51  AST 15 - 41 U/L 14(L) - 15  ALT 0 - 44 U/L 11 - 12    DIAGNOSTIC IMAGING:  I have independently reviewed the scans and discussed with the patient. CT SOFT TISSUE NECK W CONTRAST  Result Date: 03/25/2021 CLINICAL DATA:  Tonsillar cancer, status post chemo and radiation a year and a half ago. Lump palpated on left side of neck EXAM: CT NECK WITH CONTRAST TECHNIQUE: Multidetector CT imaging of the neck was performed using the standard protocol following the bolus administration of intravenous contrast. RADIATION DOSE REDUCTION: This exam was performed according to the departmental dose-optimization program which includes automated exposure control, adjustment of the mA and/or kV according to patient size and/or use of iterative reconstruction technique. CONTRAST:  10mL OMNIPAQUE IOHEXOL 300 MG/ML  SOLN COMPARISON:  07/25/2020. FINDINGS: Pharynx and larynx: Previously noted asymmetric thickening of the left aryepiglottic fold on 01/22/2020 regressed by 07/25/2020, with similar appearance on the current exam. No asymmetric enhancement or soft tissue mass. Salivary glands: No inflammation, mass, or stone. Hypoenhancement of the left submandibular gland, similar to the prior exam and possibly posttreatment changes. Thyroid: Normal. Lymph nodes: No lymphadenopathy.  Vascular: Aortic and carotid atherosclerotic plaque. Limited intracranial: Negative. Visualized orbits: No acute finding. Status post right lens replacement. Mastoids and visualized paranasal sinuses: Clear. Skeleton: Degenerative changes in the cervical spine, with osseous fusion across C6 and C7. No acute osseous abnormality. Upper chest: No focal pulmonary opacity or pleural effusion. Other: No abnormality is seen in the soft tissues of the left neck, nor is a radiopaque marker seen to denote the site of the reported palpable abnormality. IMPRESSION: 1. No evidence of recurrent disease in the neck. 2. Evaluation for the palpated abnormality in the left neck is somewhat limited by the absence of a marker denoting its location. Within this limitation, no abnormality is seen in the soft tissues of the left neck. Electronically Signed   By: Merilyn Baba M.D.   On: 03/25/2021 19:23     ASSESSMENT:  1.  T2N1 squamous cell carcinoma the left tonsil, p16 positive: -PET scan on 03/27/2019 shows uptake in the left tonsillar fossa, 1.5  cm lymph node at the level 2 on the left, no other metastatic disease. -Chemoradiation therapy with cisplatin weekly from 05/08/2019 through 06/26/2019.   2.  Prostate cancer: -Gleason 9 prostate cancer, diagnosed in 2013, status post XRT from April 2014 through May 2014 with 2 years of Lupron.  PSA is normal.   PLAN:  1.  Left tonsillar squamous cell carcinoma: - We reviewed CT soft tissue neck from 03/25/2020 with no evidence of recurrence. - Oropharyngeal exam shows there is a irregular plaque at the left anterior tonsillar pillar and buccal mucosa.  There is also indurated area in the floor of the mouth on the right side. - I have recommended evaluation with Dr. Benjamine Mola. - Reviewed labs today which showed normal LFTs and CBC.  TSH was normal. - RTC 6 months with repeat scan and labs.   2.  Nutrition/weight loss: - Cory Foley is able to eat all sorts of foods.  Cory Foley is gaining weight.    3.  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   4.  Normocytic anemia: - Hemoglobin today is 12.5 and continues to improve.   Orders placed this encounter:  Orders Placed This Encounter  Procedures   CT SOFT TISSUE NECK W CONTRAST     Derek Jack, MD [place] [phone]   I, Thana Ates, am acting as a scribe for Dr. Derek Jack.  I, Derek Jack MD, have reviewed the above documentation for accuracy and completeness, and I agree with the above.

## 2021-04-01 NOTE — Patient Instructions (Signed)
[place] at [facility] Discharge Instructions   You were seen and examined today by Dr. Delton Coombes.  He reviewed your lab work and CT results which are normal/stable.  We will refer you back to Dr. Benjamine Mola - there are suspicious lesions in your mouth under your tongue and on top of your tonsil.   Return as scheduled in 6 months with lab work and CT scan prior.    Thank you for choosing [place] at [facility] to provide your oncology and hematology care.  To afford each patient quality time with our provider, please arrive at least 15 minutes before your scheduled appointment time.   If you have a lab appointment with the [facility] please come in thru the Main Entrance and check in at the main information desk.  You need to re-schedule your appointment should you arrive 10 or more minutes late.  We strive to give you quality time with our providers, and arriving late affects you and other patients whose appointments are after yours.  Also, if you no show three or more times for appointments you may be dismissed from the clinic at the providers discretion.     Again, thank you for choosing [facility].  Our hope is that these requests will decrease the amount of time that you wait before being seen by our physicians.       _____________________________________________________________  Should you have questions after your visit to [facility], please contact our office at [phone] and follow the prompts.  Our office hours are 8:00 a.m. and 4:30 p.m. Monday - Friday.  Please note that voicemails left after 4:00 p.m. may not be returned until the following business day.  We are closed weekends and major holidays.  You do have access to a nurse 24-7, just call the main number to the clinic [phone] and do not press any options, hold on the line and a nurse will answer the phone.    For prescription refill  requests, have your pharmacy contact our office and allow 72 hours.    Due to Covid, you will need to wear a mask upon entering the hospital. If you do not have a mask, a mask will be given to you at the Main Entrance upon arrival. For doctor visits, patients may have 1 support person age 70 or older with them. For treatment visits, patients can not have anyone with them due to social distancing guidelines and our immunocompromised population.
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
## 2021-04-04 ENCOUNTER — Other Ambulatory Visit: Payer: Self-pay

## 2021-04-04 ENCOUNTER — Encounter: Payer: Self-pay | Admitting: Neurology

## 2021-04-04 ENCOUNTER — Ambulatory Visit: Payer: Medicare Other | Admitting: Neurology

## 2021-04-04 VITALS — BP 119/78 | HR 64 | Ht 68.0 in | Wt 173.4 lb

## 2021-04-04 DIAGNOSIS — G2581 Restless legs syndrome: Secondary | ICD-10-CM | POA: Diagnosis not present

## 2021-04-04 DIAGNOSIS — R519 Headache, unspecified: Secondary | ICD-10-CM | POA: Diagnosis not present

## 2021-04-04 DIAGNOSIS — Z85819 Personal history of malignant neoplasm of unspecified site of lip, oral cavity, and pharynx: Secondary | ICD-10-CM | POA: Diagnosis not present

## 2021-04-04 DIAGNOSIS — G8929 Other chronic pain: Secondary | ICD-10-CM | POA: Diagnosis not present

## 2021-04-04 MED ORDER — GABAPENTIN 300 MG PO CAPS: ORAL_CAPSULE | ORAL | 0 refills | Status: DC

## 2021-04-04 MED ORDER — BACLOFEN 10 MG PO TABS: ORAL_TABLET | ORAL | 5 refills | Status: DC

## 2021-04-04 MED ORDER — ROPINIROLE HCL 3 MG PO TABS: 3.0000 mg | ORAL_TABLET | Freq: Every day | ORAL | 0 refills | Status: DC

## 2021-04-04 NOTE — Patient Instructions (Addendum)
Increase gabapentin to 600mg  three times daily as directed Once you have started gabapentin 600mg  three times daily, start ropinirole 3mg  at night.  Once you finished 30 day bottle, contact me and we will further reduce dose When you get a headache attack, take baclofen as needed (may take up to 3 times daily - caution for drowsiness.  Do not take sumatriptan MRI of brain with and without contrast Follow up 4 months.

## 2021-04-16 IMAGING — CT CT CHEST W/ CM
2 of 5 series · 11 of 36 positions shown, 13 images · IV contrast (Omnipaque or Isovue)
Comparison: CT neck, 02/15/2019, CT abdomen pelvis, 01/14/2015

CLINICAL DATA: Evaluate for primary and metastatic disease,
pathologic cervical lymph node identified on recent CT, history of
prostate cancer

EXAM:
CT CHEST, ABDOMEN, AND PELVIS WITH CONTRAST
TECHNIQUE: Multidetector CT imaging of the chest, abdomen and pelvis was
performed following the standard protocol during bolus
administration of intravenous contrast.
CONTRAST:  100mL OMNIPAQUE IOHEXOL 300 MG/ML SOLN, additional oral
enteric contrast

[Series 2: cap with · axial · 0.84mm/px · z∈[-681,-191]mm · 8 of 126 slices shown, 10 images]
[im 14/126  mediastinal]
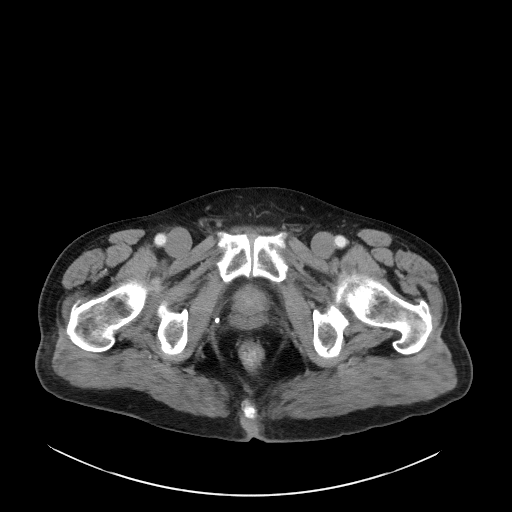
[im 14/126  lung]
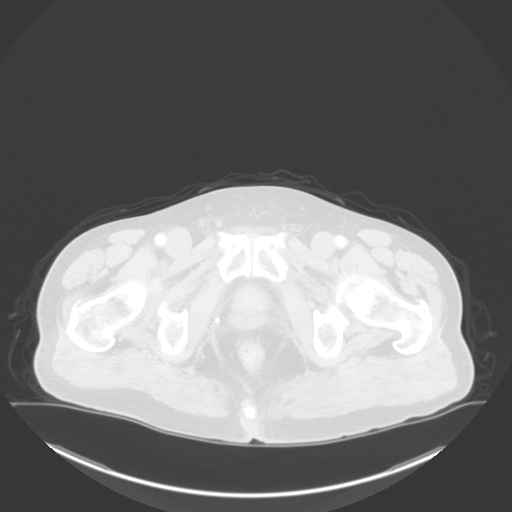
[im 28/126  lung]
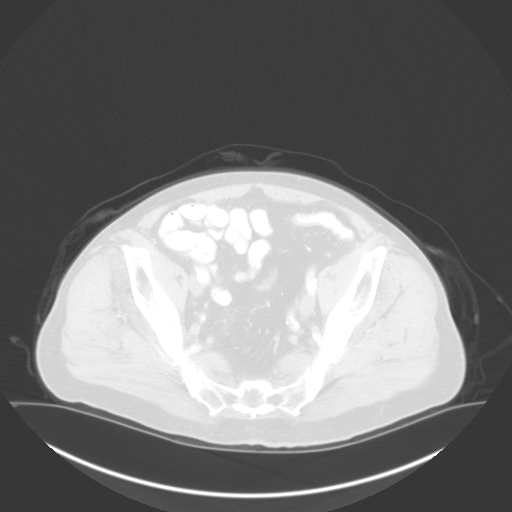
[im 42/126  lung]
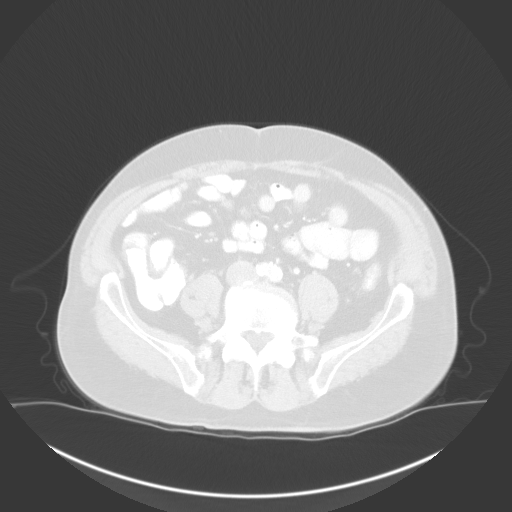
[im 56/126  lung]
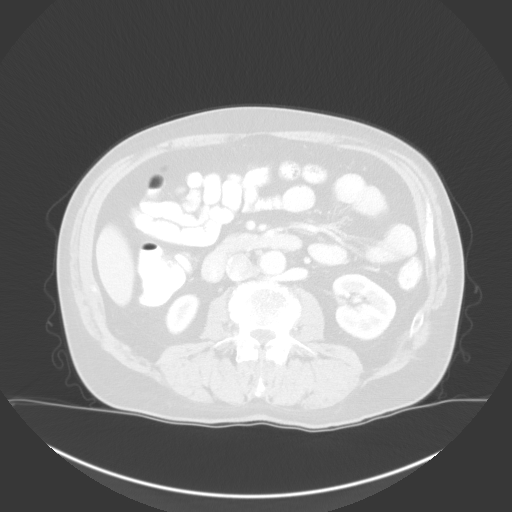
[im 70/126  mediastinal]
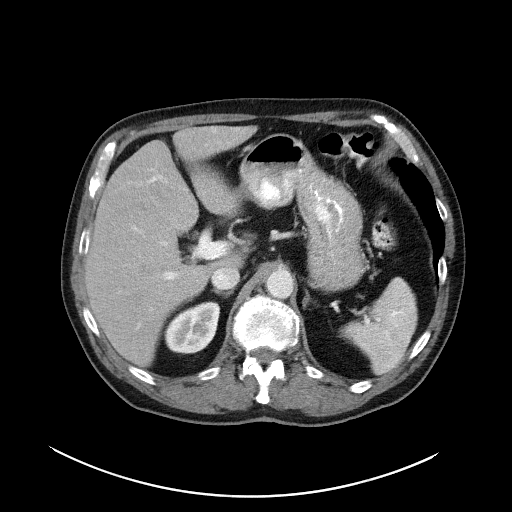
[im 70/126  lung]
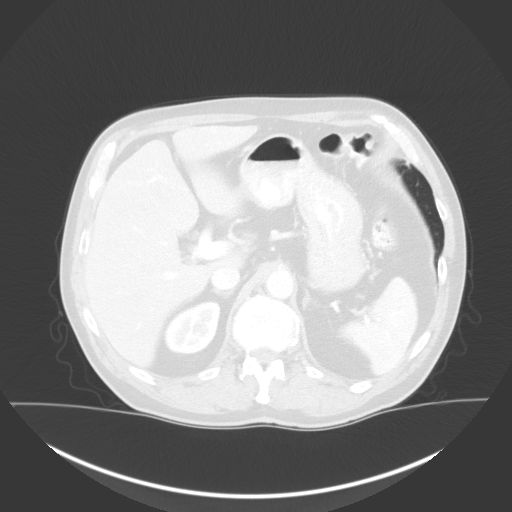
[im 84/126  lung]
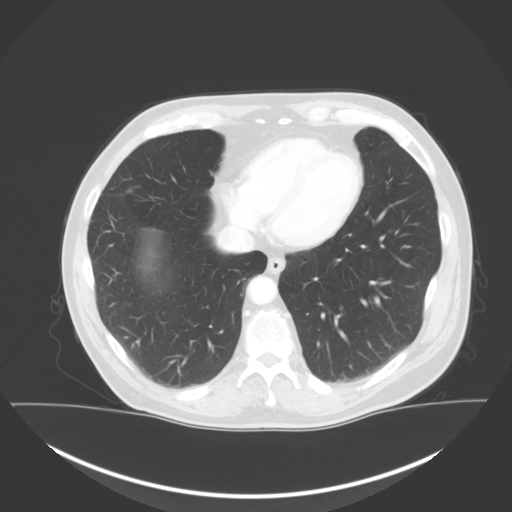
[im 98/126  lung]
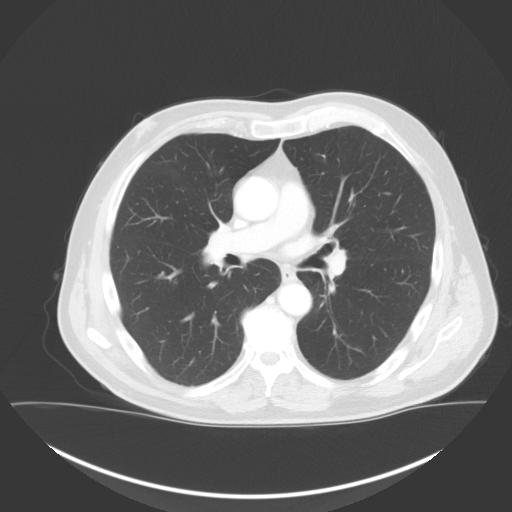
[im 112/126  lung]
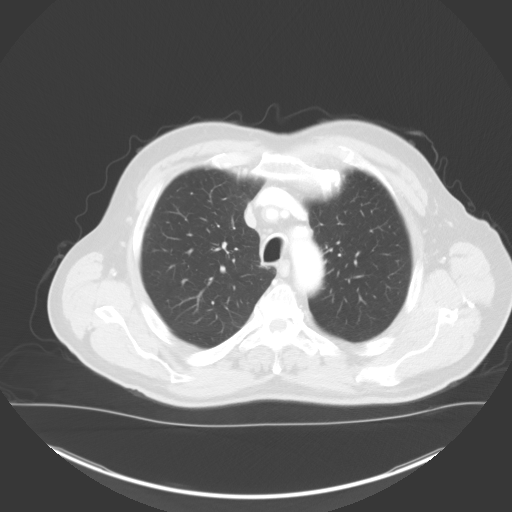

[Series 5: coronals · coronal · 0.80mm/px · 3 of 158 slices shown]
[im 32/158  lung]
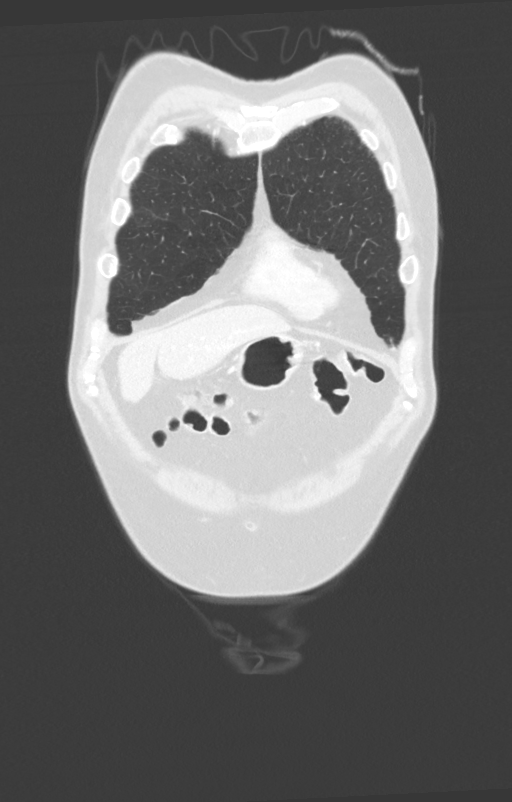
[im 63/158  lung]
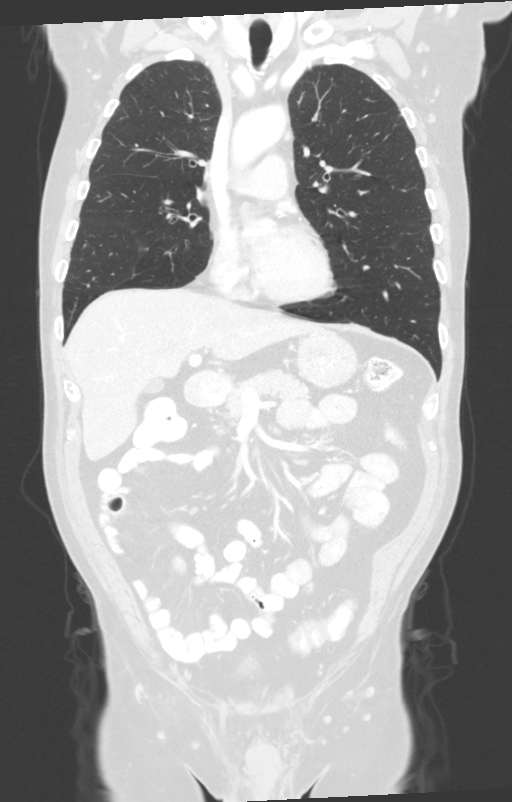
[im 95/158  lung]
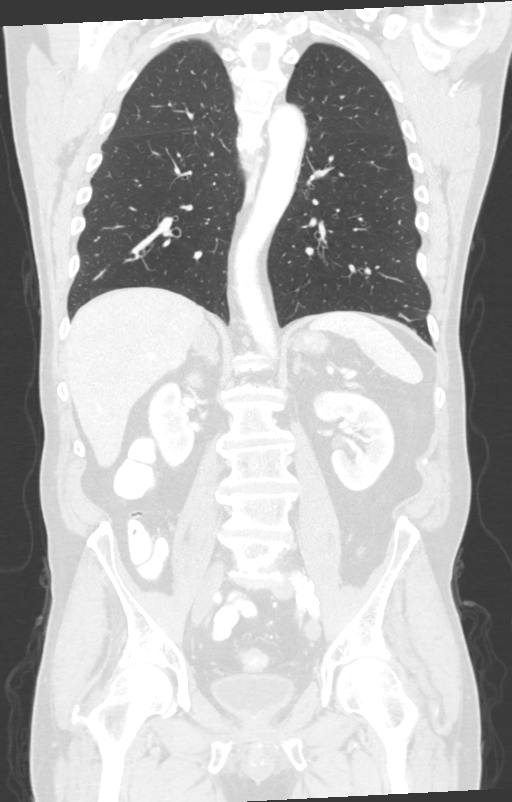

[11 of 36 positions shown; findings below may reference images not displayed]

FINDINGS: CT CHEST FINDINGS

Cardiovascular: Scattered aortic atherosclerosis. Normal heart size.
Three-vessel coronary artery calcifications. No pericardial
effusion.

Mediastinum/Nodes: No enlarged mediastinal, hilar, or axillary lymph
nodes. Thyroid gland, trachea, and esophagus demonstrate no
significant findings.

Lungs/Pleura: Mild paraseptal emphysema. Numerous tiny centrilobular
pulmonary nodules, most concentrated in the lung apices. There is a
partially calcified 4 mm pulmonary nodule of the left lower lobe,
included on prior CT of the abdomen and pelvis, benign (series 4,
image 99). No pleural effusion or pneumothorax.

Musculoskeletal: No chest wall mass or suspicious bone lesions
identified.

CT ABDOMEN PELVIS FINDINGS

Hepatobiliary: No solid liver abnormality is seen. No gallstones,
gallbladder wall thickening, or biliary dilatation.

Pancreas: Unremarkable. No pancreatic ductal dilatation or
surrounding inflammatory changes.

Spleen: Normal in size without significant abnormality.

Adrenals/Urinary Tract: Stable, benign, 2.3 cm right adrenal
adenoma. Kidneys are normal, without renal calculi, solid lesion, or
hydronephrosis. Bladder is unremarkable.

Stomach/Bowel: Stomach is within normal limits. Appendix appears
normal. No evidence of bowel wall thickening, distention, or
inflammatory changes.

Vascular/Lymphatic: Aortic atherosclerosis. Left common iliac artery
stent. Incidental note of retroaortic left renal vein. No enlarged
abdominal or pelvic lymph nodes.

Reproductive: No mass or other abnormality.

Other: No abdominal wall hernia or abnormality. No abdominopelvic
ascites.

Musculoskeletal: No acute or significant osseous findings.
IMPRESSION: 1. No evidence of primary malignancy, additional lymphadenopathy, or
metastatic disease in the chest, abdomen, or pelvis to correspond to
suspicious left cervical lymph node seen on recent CT.
2. Numerous tiny centrilobular pulmonary nodules, most concentrated
in the lung apices, likely reflecting smoking-related respiratory
bronchiolitis.
3. Mild emphysema.  Emphysema ([id number].8).
4. Stable benign right adrenal adenoma.
5. Coronary artery disease.  Aortic Atherosclerosis ([id number]).

## 2021-04-17 ENCOUNTER — Ambulatory Visit (HOSPITAL_COMMUNITY)
Admission: RE | Admit: 2021-04-17 | Discharge: 2021-04-17 | Disposition: A | Discharge status: Home / Self Care | Payer: Medicare Other | Source: Ambulatory Visit | Attending: Neurology | Admitting: Neurology

## 2021-04-17 ENCOUNTER — Other Ambulatory Visit: Payer: Self-pay

## 2021-04-17 DIAGNOSIS — R531 Weakness: Secondary | ICD-10-CM | POA: Diagnosis not present

## 2021-04-17 DIAGNOSIS — R519 Headache, unspecified: Secondary | ICD-10-CM | POA: Diagnosis not present

## 2021-04-17 DIAGNOSIS — G8929 Other chronic pain: Secondary | ICD-10-CM | POA: Insufficient documentation

## 2021-04-17 DIAGNOSIS — G9389 Other specified disorders of brain: Secondary | ICD-10-CM | POA: Diagnosis not present

## 2021-04-17 DIAGNOSIS — Z85819 Personal history of malignant neoplasm of unspecified site of lip, oral cavity, and pharynx: Secondary | ICD-10-CM | POA: Insufficient documentation

## 2021-04-17 MED ORDER — GADOBUTROL 1 MMOL/ML IV SOLN
7.0000 mL | Freq: Once | INTRAVENOUS | Status: AC | PRN
  Administered 2021-04-17: 7 mL via INTRAVENOUS

## 2021-04-18 ENCOUNTER — Other Ambulatory Visit: Payer: Self-pay | Admitting: Internal Medicine

## 2021-04-18 DIAGNOSIS — Z85819 Personal history of malignant neoplasm of unspecified site of lip, oral cavity, and pharynx: Secondary | ICD-10-CM | POA: Diagnosis not present

## 2021-04-22 NOTE — Progress Notes (Signed)
LMOVM for pt to call the office back.

## 2021-04-23 ENCOUNTER — Other Ambulatory Visit (INDEPENDENT_AMBULATORY_CARE_PROVIDER_SITE_OTHER): Payer: Self-pay | Admitting: Otolaryngology

## 2021-04-23 DIAGNOSIS — D3709 Neoplasm of uncertain behavior of other specified sites of the oral cavity: Secondary | ICD-10-CM | POA: Diagnosis not present

## 2021-04-23 DIAGNOSIS — B079 Viral wart, unspecified: Secondary | ICD-10-CM | POA: Diagnosis not present

## 2021-04-23 NOTE — Progress Notes (Signed)
PT advised of his MRI results. Per pt should he continue taking the gabapentin and baclofen. Pt stop Sumatriptan as discussed.  Pt want to let Dr.Jaffe know his legs has gotten increasingly worse like he said.   Please call patient to advise to continue medication.

## 2021-04-24 NOTE — Progress Notes (Signed)
LMOVM for pt, Please give [place] a call back.

## 2021-04-24 NOTE — Progress Notes (Signed)
PT advised of Dr.Jaffe note.

## 2021-04-29 ENCOUNTER — Telehealth: Payer: Self-pay

## 2021-04-29 MED ORDER — ROPINIROLE HCL 2 MG PO TABS: 2.0000 mg | ORAL_TABLET | Freq: Every day | ORAL | 0 refills | Status: DC

## 2021-04-29 NOTE — Telephone Encounter (Signed)
Per DR.Jaffe last note  ?To address RLS, we will need to taper patient off ropinirole.  Once he starts gabapentin '600mg'$  three times daily, decrease ropinirole to '3mg'$  at bedtime for one month, then '2mg'$  at bedtime for one month, then 1.5 mg at bedtime for one month, then '1mg'$  at bedtime for one month, then 0.'5mg'$  at bedtime, then 0.'25mg'$  at bedtime for one month, then stop. ?

## 2021-05-14 ENCOUNTER — Other Ambulatory Visit: Payer: Self-pay | Admitting: Internal Medicine

## 2021-05-19 ENCOUNTER — Other Ambulatory Visit: Payer: Self-pay | Admitting: Neurology

## 2021-05-20 ENCOUNTER — Telehealth: Payer: Self-pay | Admitting: Neurology

## 2021-05-20 NOTE — Telephone Encounter (Signed)
Refill sent.

## 2021-05-20 NOTE — Telephone Encounter (Signed)
Patient called and stated he is out of his gabapentin.  He stated the pharmacy sent a fax and have not received it back. ?

## 2021-05-26 ENCOUNTER — Other Ambulatory Visit: Payer: Self-pay | Admitting: Internal Medicine

## 2021-05-26 DIAGNOSIS — G43709 Chronic migraine without aura, not intractable, without status migrainosus: Secondary | ICD-10-CM

## 2021-05-28 ENCOUNTER — Other Ambulatory Visit: Payer: Self-pay

## 2021-05-28 MED ORDER — ROPINIROLE HCL 1 MG PO TABS: 1.5000 mg | ORAL_TABLET | Freq: Every day | ORAL | 0 refills | Status: DC

## 2021-05-28 NOTE — Telephone Encounter (Signed)
Per pt last note 03/2021,  Once he starts gabapentin '600mg'$  three times daily, decrease ropinirole to '3mg'$  at bedtime for one month, then '2mg'$  at bedtime for one month, then 1.5 mg at bedtime for one month, then '1mg'$  at bedtime for one month, then 0.'5mg'$  at bedtime, then 0.'25mg'$  at bedtime for one month, then stop.   ? ? ?Script sent in for 1.5 mg at bedtime. Next month ?1 mg at bedtime due. ?

## 2021-06-02 ENCOUNTER — Telehealth: Payer: Self-pay | Admitting: Neurology

## 2021-06-02 NOTE — Telephone Encounter (Signed)
Per pt and his wife the patient unable to sleep and the pain he can't take it. ? ?The tylenol not helping, and the reduction of the Ropinirole not helping. ?He is aware that can happen. But he did not think it would be this bad. ? ?Please advise.  ?

## 2021-06-02 NOTE — Telephone Encounter (Signed)
Patient called requesting a call back from [place]. ? ?He said he was told to call and request a new prescription for ropinirole 1 MG, he is continuing towards discontinuing the medication. ? ?[place], [place] ?

## 2021-06-02 NOTE — Telephone Encounter (Signed)
Patient advise of increase,Increase gabapentin to '600mg'$  in morning, '600mg'$  afternoon and '900mg'$  at bedtime.  If the pain is all day, then after a week, he can increase dose to '600mg'$  in AM, '900mg'$  afternoon and '900mg'$  at bedtime for one week, then '900mg'$  three times daily  ?

## 2021-06-02 NOTE — Telephone Encounter (Signed)
Per the last note pt should be on 1.5 mg for a month then go to 1 mg for month.  ? ?Mychart message sent to patient with the note to explain the decrease.  ?

## 2021-06-10 ENCOUNTER — Other Ambulatory Visit: Payer: Self-pay | Admitting: Neurology

## 2021-06-10 MED ORDER — CLONAZEPAM 0.25 MG PO TBDP: 0.2500 mg | ORAL_TABLET | Freq: Two times a day (BID) | ORAL | 0 refills | Status: AC | PRN

## 2021-06-17 ENCOUNTER — Other Ambulatory Visit: Payer: Self-pay | Admitting: Internal Medicine

## 2021-06-20 ENCOUNTER — Other Ambulatory Visit: Payer: Self-pay | Admitting: Neurology

## 2021-06-20 DIAGNOSIS — R350 Frequency of micturition: Secondary | ICD-10-CM | POA: Diagnosis not present

## 2021-06-20 DIAGNOSIS — G2581 Restless legs syndrome: Secondary | ICD-10-CM | POA: Diagnosis not present

## 2021-06-20 DIAGNOSIS — I739 Peripheral vascular disease, unspecified: Secondary | ICD-10-CM | POA: Diagnosis not present

## 2021-06-20 DIAGNOSIS — E559 Vitamin D deficiency, unspecified: Secondary | ICD-10-CM | POA: Diagnosis not present

## 2021-06-20 DIAGNOSIS — M5136 Other intervertebral disc degeneration, lumbar region: Secondary | ICD-10-CM | POA: Diagnosis not present

## 2021-06-20 DIAGNOSIS — C099 Malignant neoplasm of tonsil, unspecified: Secondary | ICD-10-CM | POA: Diagnosis not present

## 2021-06-20 DIAGNOSIS — K219 Gastro-esophageal reflux disease without esophagitis: Secondary | ICD-10-CM | POA: Diagnosis not present

## 2021-06-20 DIAGNOSIS — R7301 Impaired fasting glucose: Secondary | ICD-10-CM | POA: Diagnosis not present

## 2021-06-20 DIAGNOSIS — J449 Chronic obstructive pulmonary disease, unspecified: Secondary | ICD-10-CM | POA: Diagnosis not present

## 2021-06-20 DIAGNOSIS — E785 Hyperlipidemia, unspecified: Secondary | ICD-10-CM | POA: Diagnosis not present

## 2021-06-20 DIAGNOSIS — M810 Age-related osteoporosis without current pathological fracture: Secondary | ICD-10-CM | POA: Diagnosis not present

## 2021-06-20 DIAGNOSIS — Z79899 Other long term (current) drug therapy: Secondary | ICD-10-CM | POA: Diagnosis not present

## 2021-06-24 DIAGNOSIS — E785 Hyperlipidemia, unspecified: Secondary | ICD-10-CM | POA: Diagnosis not present

## 2021-06-24 DIAGNOSIS — H6123 Impacted cerumen, bilateral: Secondary | ICD-10-CM | POA: Diagnosis not present

## 2021-06-24 DIAGNOSIS — H9203 Otalgia, bilateral: Secondary | ICD-10-CM | POA: Diagnosis not present

## 2021-06-24 IMAGING — XA IR IMAGING GUIDED PORT INSERTION
1 series · 2 of 2 positions shown · non-contrast
Comparison: None.

INDICATION: 68-year-old with tonsillar squamous cell carcinoma and lymph node
involvement.

EXAM:
FLUOROSCOPIC AND ULTRASOUND GUIDED PLACEMENT OF A SUBCUTANEOUS PORT

[Series 3: fl (-) angio · 2 of 2 slices shown]
[im 1/2]
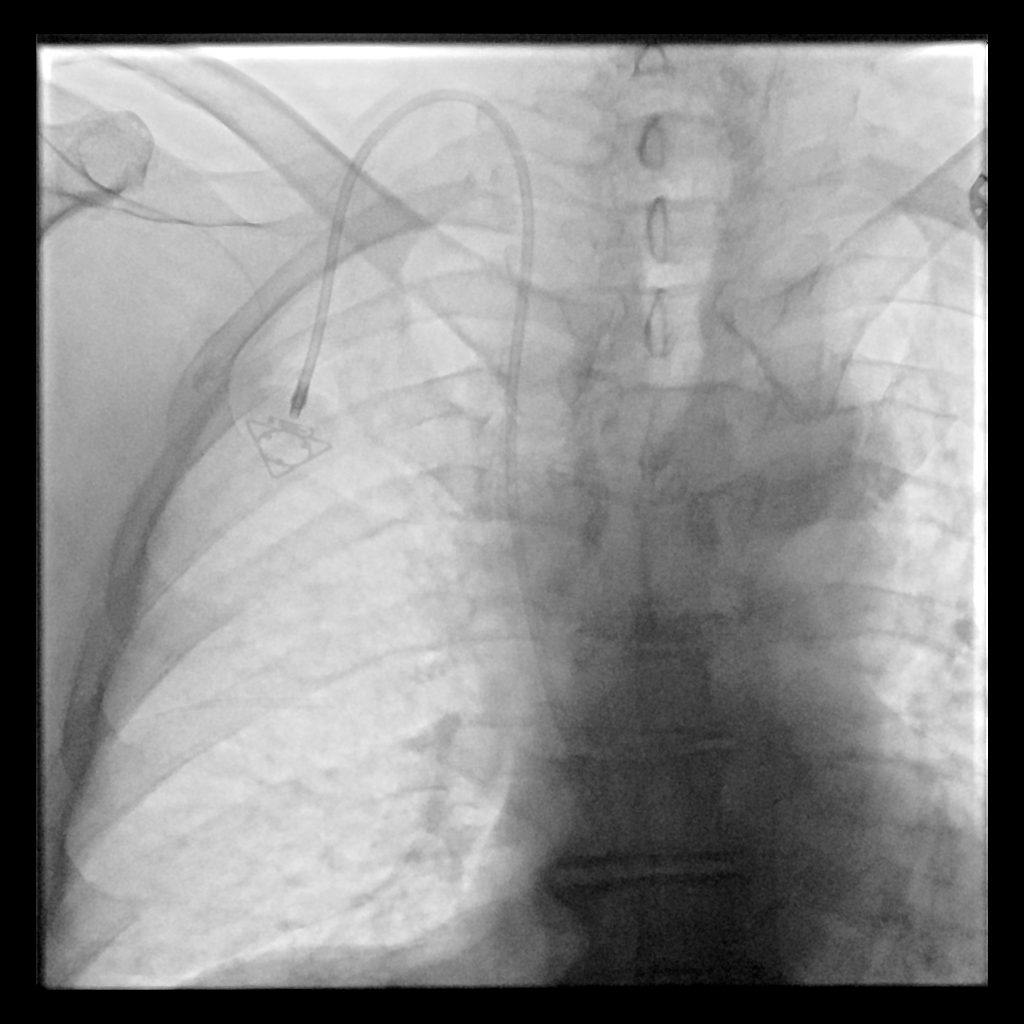
[im 2/2]
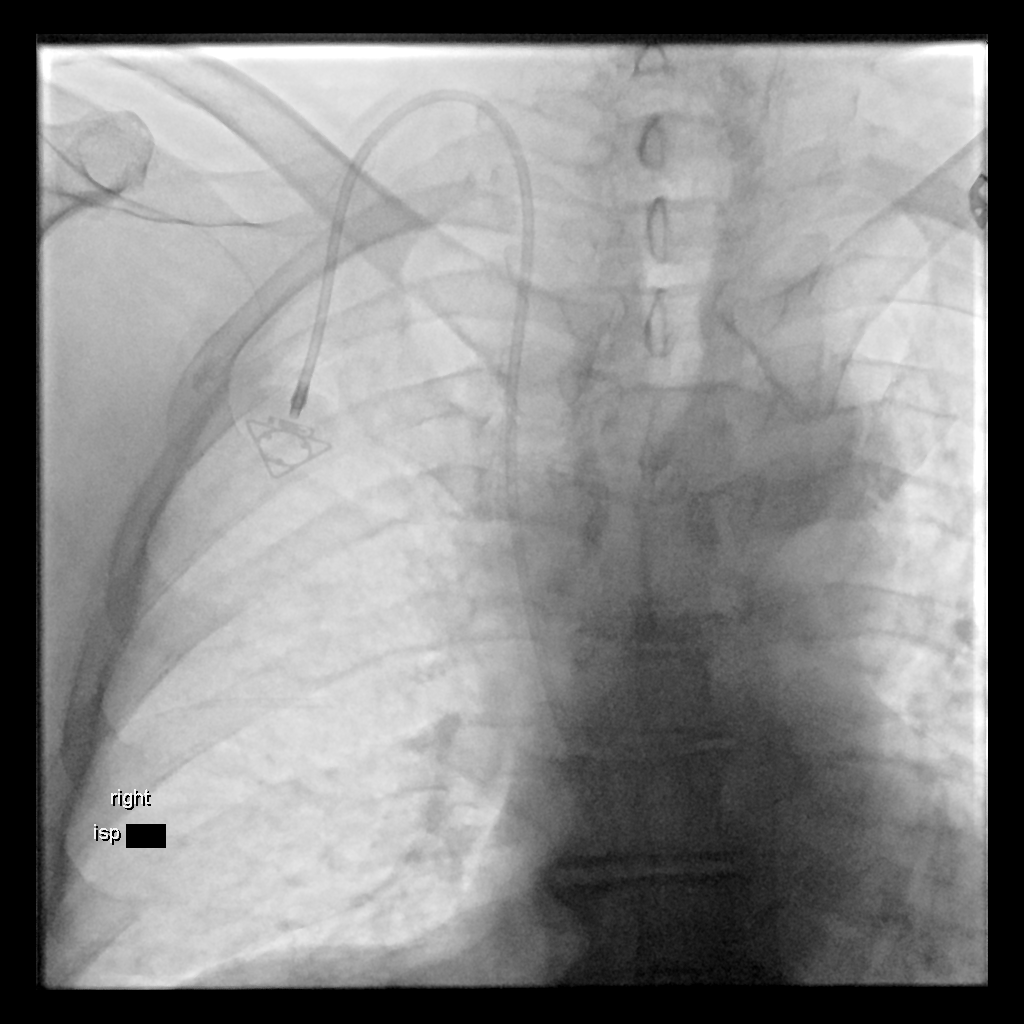

[2 of 2 positions shown; findings below may reference images not displayed]

MEDICATIONS:
Ancef 2 g; The antibiotic was administered within an appropriate
time interval prior to skin puncture.

ANESTHESIA/SEDATION:
Versed 2.0 mg IV; Fentanyl 100 mcg IV;

Moderate Sedation Time:  25 minutes

The patient was continuously monitored during the procedure by the
interventional radiology nurse under my direct supervision.

FLUOROSCOPY TIME:  30 seconds, 2 mGy

COMPLICATIONS:
None immediate.

PROCEDURE:
The procedure, risks, benefits, and alternatives were explained to
the patient. Questions regarding the procedure were encouraged and
answered. The patient understands and consents to the procedure.

Patient was placed supine on the interventional table. Ultrasound
confirmed a patent right internal jugular vein. Ultrasound image was
saved for documentation. The right chest and neck were cleaned with
a skin antiseptic and a sterile drape was placed. Maximal barrier
sterile technique was utilized including caps, mask, sterile gowns,
sterile gloves, sterile drape, hand hygiene and skin antiseptic. The
right neck was anesthetized with 1% lidocaine. Small incision was
made in the right neck with a blade. Micropuncture set was placed in
the right internal jugular vein with ultrasound guidance. The
micropuncture wire was used for measurement purposes. The right
chest was anesthetized with 1% lidocaine with epinephrine. #15 blade
was used to make an incision and a subcutaneous port pocket was
formed. 8 french Power Port was assembled. Subcutaneous tunnel was
formed with a stiff tunneling device. The port catheter was brought
through the subcutaneous tunnel. The port was placed in the
subcutaneous pocket. The micropuncture set was exchanged for a
peel-away sheath. The catheter was placed through the peel-away
sheath and the tip was positioned at the SVC and right atrium
junction. Catheter placement was confirmed with fluoroscopy. The
port was accessed and flushed with heparinized saline. The port
pocket was closed using two layers of absorbable sutures and
Dermabond. The vein skin site was closed using a single layer of
absorbable suture and Dermabond. Sterile dressings were applied.
Patient tolerated the procedure well without an immediate
complication. Ultrasound and fluoroscopic images were taken and
saved for this procedure.
IMPRESSION: Placement of a subcutaneous port device. Catheter tip at the SVC and
right atrium junction.

## 2021-07-07 ENCOUNTER — Other Ambulatory Visit: Payer: Self-pay

## 2021-07-07 ENCOUNTER — Encounter: Payer: Self-pay | Admitting: Neurology

## 2021-07-07 DIAGNOSIS — Z471 Aftercare following joint replacement surgery: Secondary | ICD-10-CM | POA: Diagnosis not present

## 2021-07-07 DIAGNOSIS — F17210 Nicotine dependence, cigarettes, uncomplicated: Secondary | ICD-10-CM | POA: Diagnosis not present

## 2021-07-07 DIAGNOSIS — M47816 Spondylosis without myelopathy or radiculopathy, lumbar region: Secondary | ICD-10-CM | POA: Diagnosis not present

## 2021-07-07 MED ORDER — ROPINIROLE HCL 1 MG PO TABS: 1.0000 mg | ORAL_TABLET | Freq: Every day | ORAL | 0 refills | Status: DC

## 2021-07-07 NOTE — Telephone Encounter (Signed)
Per Last office visit notes, ?Once he starts gabapentin '600mg'$  three times daily, decrease ropinirole to '3mg'$  at bedtime for one month, then '2mg'$  at bedtime for one month, then 1.5 mg at bedtime for one month, then '1mg'$  at bedtime for one month, then 0.'5mg'$  at bedtime, then 0.'25mg'$  at bedtime for one month, then stop. ?1 mg Requip sent into the pharmacy. ?

## 2021-07-17 ENCOUNTER — Other Ambulatory Visit: Payer: Self-pay

## 2021-07-17 MED ORDER — GABAPENTIN 300 MG PO CAPS: ORAL_CAPSULE | ORAL | 0 refills | Status: AC

## 2021-07-23 DIAGNOSIS — J449 Chronic obstructive pulmonary disease, unspecified: Secondary | ICD-10-CM | POA: Diagnosis not present

## 2021-07-23 DIAGNOSIS — I739 Peripheral vascular disease, unspecified: Secondary | ICD-10-CM | POA: Diagnosis not present

## 2021-07-23 DIAGNOSIS — M5136 Other intervertebral disc degeneration, lumbar region: Secondary | ICD-10-CM | POA: Diagnosis not present

## 2021-07-23 DIAGNOSIS — G2581 Restless legs syndrome: Secondary | ICD-10-CM | POA: Diagnosis not present

## 2021-07-23 DIAGNOSIS — M810 Age-related osteoporosis without current pathological fracture: Secondary | ICD-10-CM | POA: Diagnosis not present

## 2021-07-23 DIAGNOSIS — G629 Polyneuropathy, unspecified: Secondary | ICD-10-CM | POA: Diagnosis not present

## 2021-07-23 DIAGNOSIS — R937 Abnormal findings on diagnostic imaging of other parts of musculoskeletal system: Secondary | ICD-10-CM | POA: Diagnosis not present

## 2021-07-23 DIAGNOSIS — F17218 Nicotine dependence, cigarettes, with other nicotine-induced disorders: Secondary | ICD-10-CM | POA: Diagnosis not present

## 2021-07-23 DIAGNOSIS — Z122 Encounter for screening for malignant neoplasm of respiratory organs: Secondary | ICD-10-CM | POA: Diagnosis not present

## 2021-07-30 ENCOUNTER — Ambulatory Visit: Payer: Medicare Other | Admitting: Internal Medicine

## 2021-07-31 ENCOUNTER — Other Ambulatory Visit: Payer: Self-pay

## 2021-07-31 ENCOUNTER — Emergency Department (HOSPITAL_COMMUNITY): Payer: Medicare Other

## 2021-07-31 ENCOUNTER — Encounter (HOSPITAL_COMMUNITY): Payer: Self-pay | Admitting: Emergency Medicine

## 2021-07-31 ENCOUNTER — Emergency Department (HOSPITAL_COMMUNITY)
Admission: EM | Admit: 2021-07-31 | Discharge: 2021-07-31 | Disposition: A | Discharge status: Home / Self Care | Payer: Medicare Other | Attending: Emergency Medicine | Admitting: Emergency Medicine

## 2021-07-31 DIAGNOSIS — Z8546 Personal history of malignant neoplasm of prostate: Secondary | ICD-10-CM | POA: Diagnosis not present

## 2021-07-31 DIAGNOSIS — Z7902 Long term (current) use of antithrombotics/antiplatelets: Secondary | ICD-10-CM | POA: Diagnosis not present

## 2021-07-31 DIAGNOSIS — S169XXA Unspecified injury of muscle, fascia and tendon at neck level, initial encounter: Secondary | ICD-10-CM | POA: Diagnosis present

## 2021-07-31 DIAGNOSIS — X58XXXA Exposure to other specified factors, initial encounter: Secondary | ICD-10-CM | POA: Diagnosis not present

## 2021-07-31 DIAGNOSIS — Z7982 Long term (current) use of aspirin: Secondary | ICD-10-CM | POA: Insufficient documentation

## 2021-07-31 DIAGNOSIS — S161XXA Strain of muscle, fascia and tendon at neck level, initial encounter: Secondary | ICD-10-CM | POA: Diagnosis not present

## 2021-07-31 DIAGNOSIS — J449 Chronic obstructive pulmonary disease, unspecified: Secondary | ICD-10-CM | POA: Diagnosis not present

## 2021-07-31 MED ORDER — NAPROXEN 375 MG PO TABS: 375.0000 mg | ORAL_TABLET | Freq: Two times a day (BID) | ORAL | 0 refills | Status: DC

## 2021-07-31 MED ORDER — HYDROCODONE-ACETAMINOPHEN 5-325 MG PO TABS
1.0000 | ORAL_TABLET | Freq: Once | ORAL | Status: AC
  Administered 2021-07-31: 1 via ORAL
  Filled 2021-07-31: qty 1

## 2021-07-31 MED ORDER — KETOROLAC TROMETHAMINE 60 MG/2ML IM SOLN
30.0000 mg | Freq: Once | INTRAMUSCULAR | Status: AC
  Administered 2021-07-31: 30 mg via INTRAMUSCULAR
  Filled 2021-07-31: qty 2

## 2021-07-31 MED ORDER — HYDROCODONE-ACETAMINOPHEN 5-325 MG PO TABS: ORAL_TABLET | ORAL | 0 refills | Status: DC

## 2021-07-31 NOTE — ED Triage Notes (Signed)
Pt has neck pain that is radiating down both shoulders, since Sunday, no known injury.

## 2021-07-31 NOTE — ED Provider Notes (Signed)
[facility] EMERGENCY DEPARTMENT Provider Note   CSN: [id number] Arrival date & time: 07/31/21  1756     History {Add pertinent medical, surgical, social history, OB history to HPI:1} Chief Complaint  Patient presents with   Neck Pain    Cory Foley is a 71 y.o. male.   Neck Pain Associated symptoms: no chest pain, no fever, no headaches, no numbness and no weakness        Cory Foley is a 71 y.o. male with past medical history of peripheral vascular disease, BPH, PAD prostate cancer and COPD who presents to the Emergency Department complaining of lower neck pain.  Symptoms began 4 days ago.  He began having pain at the base of his neck that radiated down into his right shoulder and right upper arm to the level of his elbow.  2 days later, pain began to radiate across to his left shoulder as well.  States that when he rubs his neck he hears "popping."  Pain does not radiate into the left arm.  He denies any numbness weakness or tingling of his upper extremities or decreased grip strength.  No headaches, dizziness, fever or chills.  No visual changes.  He endorses an injury to his neck 25 years ago and states that he has intermittent pains of his neck, but usually are brief and self-limited.  He has tried multiple over-the-counter pain relievers, muscle rubs and creams without relief.  No known recent injury. He is followed by oncology and has CT soft tissues of the neck every 6 months secondary to his history of palatine tonsil cancer which is currently in remission   Home Medications Prior to Admission medications   Medication Sig Start Date End Date Taking? Authorizing Provider  clonazePAM (KLONOPIN) 0.25 MG disintegrating tablet Take 1 tablet (0.25 mg total) by mouth 2 (two) times daily as needed for seizure. 06/10/21   Pieter Partridge, DO  aspirin EC 81 MG tablet Take 81 mg by mouth daily.    [provider]  baclofen (LIORESAL) 10 MG tablet Three times daily as needed  04/04/21   Pieter Partridge, DO  calcium carbonate (OS-CAL) 600 MG tablet Take 1 tablet (600 mg total) by mouth 2 (two) times daily with a meal. Patient taking differently: Take 600 mg by mouth 2 (two) times daily. 07/26/17   Alycia Rossetti, MD  Cholecalciferol (VITAMIN D) 50 MCG (2000 UT) tablet Take 2,000 Units by mouth daily.    [provider]  clopidogrel (PLAVIX) 75 MG tablet TAKE 1 TABLET BY MOUTH ONCE A DAY. 06/17/21   Lindell Spar, MD  cyanocobalamin 1000 MCG tablet Take 1,000 mcg by mouth 2 (two) times daily.    [provider]  famotidine (PEPCID) 20 MG tablet Take 1 tablet (20 mg total) by mouth at bedtime. 10/02/20   Lindell Spar, MD  Ferrous Sulfate (IRON) 325 (65 Fe) MG TABS 1 tablet daily 03/04/20   Alycia Rossetti, MD  gabapentin (NEURONTIN) 300 MG capsule 3 CAPSULES 3 TIMES DAILY. 07/17/21   Pieter Partridge, DO  magnesium oxide (MAG-OX) 400 (241.3 Mg) MG tablet Take 1 tablet (400 mg total) by mouth 2 (two) times daily. 07/19/19   Derek Jack, MD  pantoprazole (PROTONIX) 40 MG tablet TAKE ONE TABLET BY MOUTH DAILY. 04/01/21   Lindell Spar, MD  propranolol (INDERAL) 20 MG tablet TAKE (1) TABLET BY MOUTH TWICE DAILY. 05/26/21   Lindell Spar, MD  rOPINIRole (REQUIP)  1 MG tablet Take 1 tablet (1 mg total) by mouth at bedtime. 1 mg for a month 07/07/21   Tomi Likens, Adam R, DO  simvastatin (ZOCOR) 40 MG tablet Take 1 tablet (40 mg total) by mouth at bedtime. 02/13/21   Lindell Spar, MD      Allergies    Codeine, Morphine and related, and Percocet [oxycodone-acetaminophen]    Review of Systems   Review of Systems  Constitutional:  Negative for chills and fever.  Eyes:  Negative for visual disturbance.  Respiratory:  Negative for chest tightness and shortness of breath.   Cardiovascular:  Negative for chest pain.  Gastrointestinal:  Negative for nausea and vomiting.  Musculoskeletal:  Positive for neck pain. Negative for neck stiffness.  Neurological:   Negative for dizziness, weakness, numbness and headaches.    Physical Exam Updated Vital Signs BP 131/80   Pulse 63   Temp 98.1 F (36.7 C) (Oral)   Ht '5\' 8"'$  (1.727 m)   Wt 77.1 kg   SpO2 96%   BMI 25.85 kg/m  Physical Exam Vitals and nursing note reviewed.  Constitutional:      General: He is not in acute distress.    Appearance: Normal appearance. He is not toxic-appearing.  HENT:     Mouth/Throat:     Mouth: Mucous membranes are moist.  Neck:     Trachea: Phonation normal.     Comments: Tenderness to palpation at the base of the cervical spine and bilateral trapezius muscles. Cardiovascular:     Rate and Rhythm: Normal rate and regular rhythm.     Pulses: Normal pulses.  Pulmonary:     Effort: Pulmonary effort is normal.     Breath sounds: Normal breath sounds.  Chest:     Chest wall: No tenderness.  Musculoskeletal:        General: Normal range of motion.     Cervical back: Normal range of motion. Tenderness present. No rigidity or torticollis. Spinous process tenderness and muscular tenderness present. Normal range of motion.  Skin:    General: Skin is warm.     Capillary Refill: Capillary refill takes less than 2 seconds.     Findings: No erythema or rash.  Neurological:     General: No focal deficit present.     Mental Status: He is alert.     Sensory: No sensory deficit.     Motor: No weakness.     ED Results / Procedures / Treatments   Labs (all labs ordered are listed, but only abnormal results are displayed) Labs Reviewed - No data to display  EKG None  Radiology No results found.  Procedures Procedures  {Document cardiac monitor, telemetry assessment procedure when appropriate:1}  Medications Ordered in ED Medications  HYDROcodone-acetaminophen (NORCO/VICODIN) 5-325 MG per tablet 1 tablet (has no administration in time range)  ketorolac (TORADOL) injection 30 mg (has no administration in time range)    ED Course/ Medical Decision  Making/ A&P                           Medical Decision Making Patient here with pain to the base of his cervical spine x4 days.  Pain radiates across top of both shoulders and radiates into right upper arm to the level of the elbow.  No known injury.  Endorses recurrent neck pain since old injury 25 years ago.  Pain is usually mild and self-limiting but current pain has persisted despite over-the-counter  therapies.  He denies any numbness tingling or weakness of his upper extremities.  No headache or dizziness.  On exam, patient has tenderness to the base of the cervical spine, no bony deformities.  Bilateral paraspinal muscle tenderness as well.  He has full range of motion of the neck and upper extremities.  No focal neuro deficits.  Shoulder joints are nontender.  I suspect symptoms are musculoskeletal but given prior history of CA, recurrence also considered  Amount and/or Complexity of Data Reviewed External Data Reviewed: notes.    Details: Review of prior medical records performed by me, patient had CT soft tissue neck in January of this year that showed degenerative changes and osseous fusion at C6 and 7 Radiology: ordered.  Risk Prescription drug management.   ***  {Document critical care time when appropriate:1} {Document review of labs and clinical decision tools ie heart score, Chads2Vasc2 etc:1}  {Document your independent review of radiology images, and any outside records:1} {Document your discussion with family members, caretakers, and with consultants:1} {Document social determinants of health affecting pt's care:1} {Document your decision making why or why not admission, treatments were needed:1} Final Clinical Impression(s) / ED Diagnoses Final diagnoses:  None    Rx / DC Orders ED Discharge Orders     None

## 2021-07-31 NOTE — Discharge Instructions (Signed)
Apply warm heat on and off to your neck.  You may continue to take your baclofen if needed for muscle spasms.  Follow-up with your primary care provider for recheck or your orthopedic provider.  Return to the emergency department for any new or worsening symptoms.

## 2021-08-06 ENCOUNTER — Ambulatory Visit: Payer: Medicare Other | Admitting: Internal Medicine

## 2021-08-08 ENCOUNTER — Other Ambulatory Visit: Payer: Self-pay | Admitting: Neurology

## 2021-08-08 MED ORDER — ROPINIROLE HCL 0.5 MG PO TABS: 0.5000 mg | ORAL_TABLET | Freq: Every day | ORAL | 0 refills | Status: DC

## 2021-08-13 ENCOUNTER — Ambulatory Visit: Payer: Medicare Other | Admitting: Neurology

## 2021-09-30 ENCOUNTER — Other Ambulatory Visit: Payer: Medicare Other

## 2021-09-30 ENCOUNTER — Other Ambulatory Visit (HOSPITAL_COMMUNITY): Payer: Medicare Other

## 2021-10-07 ENCOUNTER — Ambulatory Visit: Payer: Medicare Other | Admitting: Hematology

## 2021-12-04 ENCOUNTER — Other Ambulatory Visit: Payer: Self-pay

## 2021-12-15 ENCOUNTER — Other Ambulatory Visit: Payer: Self-pay

## 2021-12-18 ENCOUNTER — Other Ambulatory Visit: Payer: Self-pay | Admitting: Internal Medicine

## 2021-12-30 ENCOUNTER — Encounter (HOSPITAL_COMMUNITY): Payer: Medicare Other

## 2022-05-17 ENCOUNTER — Encounter (HOSPITAL_COMMUNITY): Payer: Self-pay

## 2022-05-17 ENCOUNTER — Other Ambulatory Visit: Payer: Self-pay

## 2022-05-17 ENCOUNTER — Emergency Department (HOSPITAL_COMMUNITY)
Admission: EM | Admit: 2022-05-17 | Discharge: 2022-05-17 | Disposition: A | Discharge status: Home / Self Care | Payer: Medicare Other | Attending: Emergency Medicine | Admitting: Emergency Medicine

## 2022-05-17 DIAGNOSIS — R339 Retention of urine, unspecified: Secondary | ICD-10-CM | POA: Diagnosis not present

## 2022-05-17 DIAGNOSIS — Z7902 Long term (current) use of antithrombotics/antiplatelets: Secondary | ICD-10-CM | POA: Diagnosis not present

## 2022-05-17 DIAGNOSIS — R310 Gross hematuria: Secondary | ICD-10-CM

## 2022-05-17 DIAGNOSIS — J449 Chronic obstructive pulmonary disease, unspecified: Secondary | ICD-10-CM | POA: Insufficient documentation

## 2022-05-17 DIAGNOSIS — Z8546 Personal history of malignant neoplasm of prostate: Secondary | ICD-10-CM | POA: Insufficient documentation

## 2022-05-17 DIAGNOSIS — Z7982 Long term (current) use of aspirin: Secondary | ICD-10-CM | POA: Diagnosis not present

## 2022-05-17 DIAGNOSIS — R319 Hematuria, unspecified: Secondary | ICD-10-CM | POA: Diagnosis present

## 2022-05-17 LAB — URINALYSIS, ROUTINE W REFLEX MICROSCOPIC
Bacteria, UA: NONE SEEN
Bilirubin Urine: NEGATIVE
Glucose, UA: NEGATIVE mg/dL
Ketones, ur: NEGATIVE mg/dL
Leukocytes,Ua: NEGATIVE
Nitrite: NEGATIVE
Protein, ur: >=300 mg/dL — AB
RBC / HPF: >50 RBC/hpf (ref 0–5)
Specific Gravity, Urine: 1.01 (ref 1.005–1.030)
WBC, UA: >50 WBC/hpf (ref 0–5)
pH: 7 (ref 5.0–8.0)

## 2022-05-17 NOTE — Discharge Instructions (Addendum)
You have been seen today for your complaint of blood in your urine, urinary retention. Follow up with: Either [facility] or [facility].  You should call your preferred urologist tomorrow to schedule an appointment for as soon as possible Please seek immediate medical care if you develop any of the following symptoms: You have chills or a fever. You have blood in your urine. You have a catheter and the following happens: Your catheter stops draining urine. Your catheter falls out. At this time there does not appear to be the presence of an emergent medical condition, however there is always the potential for conditions to change. Please read and follow the below instructions.  Do not take your medicine if  develop an itchy rash, swelling in your mouth or lips, or difficulty breathing; call 911 and seek immediate emergency medical attention if this occurs.  You may review your lab tests and imaging results in their entirety on your MyChart account.  Please discuss all results of fully with your primary care provider and other specialist at your follow-up visit.  Note: Portions of this text may have been transcribed using voice recognition software. Every effort was made to ensure accuracy; however, inadvertent computerized transcription errors may still be present.

## 2022-05-17 NOTE — ED Provider Notes (Signed)
[facility] Provider Note   CSN: [id number] Arrival date & time: 05/17/22  1412     History  Chief Complaint  Patient presents with   Hematuria    Cory Foley is a 72 y.o. male.  With a history of anxiety, osteoporosis, hyperlipidemia, COPD, GERD, arthritis, BPH, prostate cancer undergoing radiation therapy in 2014 presents to the ED for evaluation of urinary retention and frank hematuria.  He states that this began at approximately 10:00 this morning.  He denies history of similar.  He has since had difficulty with urinating and states that it is mostly blood clots.  He did have some pubic pressure, however while in the ED he was able to void a slightly large amount of urine and is currently without symptoms.  He has not followed up with urology since his radiation in 2014.  He denies abdominal pain, fevers, chills, chest pain, shortness of breath.  Bladder scan by nurse tech reveals 600 cc  Hematuria       Home Medications Prior to Admission medications   Medication Sig Start Date End Date Taking? Authorizing Provider  clonazePAM (KLONOPIN) 0.25 MG disintegrating tablet Take 1 tablet (0.25 mg total) by mouth 2 (two) times daily as needed for seizure. 06/10/21   Pieter Partridge, DO  aspirin EC 81 MG tablet Take 81 mg by mouth daily.    [provider]  baclofen (LIORESAL) 10 MG tablet Three times daily as needed 04/04/21   Pieter Partridge, DO  calcium carbonate (OS-CAL) 600 MG tablet Take 1 tablet (600 mg total) by mouth 2 (two) times daily with a meal. Patient taking differently: Take 600 mg by mouth 2 (two) times daily. 07/26/17   Alycia Rossetti, MD  Cholecalciferol (VITAMIN D) 50 MCG (2000 UT) tablet Take 2,000 Units by mouth daily.    [provider]  clopidogrel (PLAVIX) 75 MG tablet TAKE 1 TABLET BY MOUTH ONCE A DAY. 12/18/21   Lindell Spar, MD  cyanocobalamin 1000 MCG tablet Take 1,000 mcg by mouth 2 (two)  times daily.    [provider]  famotidine (PEPCID) 20 MG tablet Take 1 tablet (20 mg total) by mouth at bedtime. 10/02/20   Lindell Spar, MD  Ferrous Sulfate (IRON) 325 (65 Fe) MG TABS 1 tablet daily 03/04/20   Alycia Rossetti, MD  gabapentin (NEURONTIN) 300 MG capsule 3 CAPSULES 3 TIMES DAILY. 07/17/21   Pieter Partridge, DO  HYDROcodone-acetaminophen (NORCO/VICODIN) 5-325 MG tablet Take one tab po q 4 hrs prn pain 07/31/21   Triplett, Tammy, PA-C  magnesium oxide (MAG-OX) 400 (241.3 Mg) MG tablet Take 1 tablet (400 mg total) by mouth 2 (two) times daily. 07/19/19   Derek Jack, MD  naproxen (NAPROSYN) 375 MG tablet Take 1 tablet (375 mg total) by mouth 2 (two) times daily. Take with food 07/31/21   Triplett, Tammy, PA-C  pantoprazole (PROTONIX) 40 MG tablet TAKE ONE TABLET BY MOUTH DAILY. 04/01/21   Lindell Spar, MD  propranolol (INDERAL) 20 MG tablet TAKE (1) TABLET BY MOUTH TWICE DAILY. 05/26/21   Lindell Spar, MD  rOPINIRole (REQUIP) 0.5 MG tablet Take 1 tablet (0.5 mg total) by mouth at bedtime. 08/08/21   Pieter Partridge, DO  simvastatin (ZOCOR) 40 MG tablet Take 1 tablet (40 mg total) by mouth at bedtime. 02/13/21   Lindell Spar, MD      Allergies    Codeine, Morphine and  related, and Percocet [oxycodone-acetaminophen]    Review of Systems   Review of Systems  Genitourinary:  Positive for hematuria.  All other systems reviewed and are negative.   Physical Exam Updated Vital Signs BP (!) 154/81   Pulse (!) 57   Temp 98 F (36.7 C)   Resp 16   Ht 5\' 8"  (1.727 m)   Wt 79.4 kg   SpO2 96%   BMI 26.61 kg/m  Physical Exam Vitals and nursing note reviewed.  Constitutional:      General: He is not in acute distress.    Appearance: Normal appearance. He is normal weight. He is not ill-appearing.  HENT:     Head: Normocephalic and atraumatic.  Pulmonary:     Effort: Pulmonary effort is normal. No respiratory distress.  Abdominal:     General: Abdomen is flat.   Genitourinary:    Comments: Blood at the site of urethral meatus.  No rashes.  No TTP of bilateral testicles or shaft of penis.  No purulent discharge. Musculoskeletal:        General: Normal range of motion.     Cervical back: Neck supple.  Skin:    General: Skin is warm and dry.  Neurological:     Mental Status: He is alert and oriented to person, place, and time.  Psychiatric:        Mood and Affect: Mood normal.        Behavior: Behavior normal.     ED Results / Procedures / Treatments   Labs (all labs ordered are listed, but only abnormal results are displayed) Labs Reviewed  URINALYSIS, ROUTINE W REFLEX MICROSCOPIC - Abnormal; Notable for the following components:      Result Value   Color, Urine AMBER (*)    APPearance CLOUDY (*)    Hgb urine dipstick LARGE (*)    Protein, ur >=300 (*)    All other components within normal limits    EKG None  Radiology No results found.  Procedures Procedures    Medications Ordered in ED Medications - No data to display  ED Course/ Medical Decision Making/ A&P                             Medical Decision Making Amount and/or Complexity of Data Reviewed Labs: ordered.  This patient presents to the ED for concern of hematuria, urinary retention, this involves an extensive number of treatment options, and is a complaint that carries with it a high risk of complications and morbidity.  The differential diagnosis includes The differential diagnosis for hematuria includes but is not limited to cystitis, urinary calculi, BPH, renal cell carcinoma, transitional cell carcinoma, glomerulonephritis, polycystic kidney disease, anticoagulant usage, prostate cancer, papillary necrosis (EGD in sig abuse, DM, sickle cell trait or disease), renal infarction, interstitial nephritis, medullary sponge kidney, radiation or chemical cystitis (e.g. Cyclophosphamide), atrophic vaginitis, schistosomiasis, menses, urethra his, urethral diverticula.    Co morbidities that complicate the patient evaluation  anxiety, osteoporosis, hyperlipidemia, COPD, GERD, arthritis, BPH, prostate cancer undergoing radiation therapy in 2014  My initial workup includes urinalysis, bladder scan, consult to urology  Additional history obtained from: Nursing notes from this visit.  I ordered, reviewed and interpreted labs which include: Urinalysis.  Consistent with hematuria  Consultations Obtained:  I requested consultation with the urology Dr. Dominica Severin,  and discussed lab and imaging findings as well as pertinent plan - they recommend: Large bore Foley catheter with  irrigation and, if Foley drains well stable for discharge home with urology follow-up.  If issues persist, admission with inpatient urology consult  Afebrile, hemodynamically stable.  72 year old male presents the ED for evaluation of urinary retention and hematuria.  He initially had approximately 600 cc of urine in his bladder.  He was able to void small amounts of urine while in the ED.  Foley catheter was placed per urology recommendation which drained approximately 600 cc of urine.  Repeat bladder scan revealed an empty bladder.  Patient was encouraged to follow-up with a urologist of his choice.  He was given contact information for [facility] and [facility].  He was requesting discharge.  He was given return precautions.  Stable at discharge.  At this time there does not appear to be any evidence of an acute emergency medical condition and the patient appears stable for discharge with appropriate outpatient follow up. Diagnosis was discussed with patient who verbalizes understanding of care plan and is agreeable to discharge. I have discussed return precautions with patient and son who verbalizes understanding. Patient encouraged to follow-up with their PCP within 1 week. All questions answered.  Patient's case discussed with Dr. Rogene Houston who agrees with plan to discharge with  follow-up.   Note: Portions of this report may have been transcribed using voice recognition software. Every effort was made to ensure accuracy; however, inadvertent computerized transcription errors may still be present.        Final Clinical Impression(s) / ED Diagnoses Final diagnoses:  Gross hematuria  Urinary retention    Rx / DC Orders ED Discharge Orders     None         Roylene Reason, Hershal Coria 05/17/22 Harland Dingwall, MD 05/19/22 1447

## 2022-05-17 NOTE — ED Notes (Signed)
Pt was bladder scanned and there was 616ml. He also tried to use the restroom and voided a small amount of blood with no urine.

## 2022-05-17 NOTE — ED Triage Notes (Signed)
Pt presents with hematuria that started suddenly at 10 this AM. Pt states now he has the urge to void, but is unable to do so.

## 2022-05-17 NOTE — ED Notes (Signed)
Pt is able to drink.

## 2022-05-18 ENCOUNTER — Other Ambulatory Visit: Payer: Self-pay

## 2022-05-18 ENCOUNTER — Emergency Department (HOSPITAL_COMMUNITY)
Admission: EM | Admit: 2022-05-18 | Discharge: 2022-05-18 | Disposition: A | Discharge status: Home / Self Care | Payer: Medicare Other | Attending: Emergency Medicine | Admitting: Emergency Medicine

## 2022-05-18 DIAGNOSIS — Y732 Prosthetic and other implants, materials and accessory gastroenterology and urology devices associated with adverse incidents: Secondary | ICD-10-CM | POA: Diagnosis not present

## 2022-05-18 DIAGNOSIS — Z8546 Personal history of malignant neoplasm of prostate: Secondary | ICD-10-CM | POA: Insufficient documentation

## 2022-05-18 DIAGNOSIS — R338 Other retention of urine: Secondary | ICD-10-CM | POA: Diagnosis not present

## 2022-05-18 DIAGNOSIS — J449 Chronic obstructive pulmonary disease, unspecified: Secondary | ICD-10-CM | POA: Insufficient documentation

## 2022-05-18 DIAGNOSIS — T83091A Other mechanical complication of indwelling urethral catheter, initial encounter: Secondary | ICD-10-CM | POA: Diagnosis present

## 2022-05-18 DIAGNOSIS — Z7982 Long term (current) use of aspirin: Secondary | ICD-10-CM | POA: Insufficient documentation

## 2022-05-18 DIAGNOSIS — Z7901 Long term (current) use of anticoagulants: Secondary | ICD-10-CM | POA: Diagnosis not present

## 2022-05-18 NOTE — Discharge Instructions (Signed)
Irrigate your catheter as needed.   Return if you are having any problems.

## 2022-05-18 NOTE — ED Provider Notes (Signed)
[facility] Provider Note   CSN: [id number] Arrival date & time: 05/18/22  [id number]     History  No chief complaint on file.   Cory Foley is a 72 y.o. male.  The history is provided by the patient.  He has history of hyperlipidemia, COPD, peripheral arterial disease, prostate cancer treated with radiation therapy and was seen in the emergency department yesterday with gross hematuria and urinary retention treated with Foley catheter.  He was doing well until tonight when the catheter stopped draining.   Home Medications Prior to Admission medications   Medication Sig Start Date End Date Taking? Authorizing Provider  clonazePAM (KLONOPIN) 0.25 MG disintegrating tablet Take 1 tablet (0.25 mg total) by mouth 2 (two) times daily as needed for seizure. 06/10/21   Pieter Partridge, DO  aspirin EC 81 MG tablet Take 81 mg by mouth daily.    [provider]  baclofen (LIORESAL) 10 MG tablet Three times daily as needed 04/04/21   Pieter Partridge, DO  calcium carbonate (OS-CAL) 600 MG tablet Take 1 tablet (600 mg total) by mouth 2 (two) times daily with a meal. Patient taking differently: Take 600 mg by mouth 2 (two) times daily. 07/26/17   Alycia Rossetti, MD  Cholecalciferol (VITAMIN D) 50 MCG (2000 UT) tablet Take 2,000 Units by mouth daily.    [provider]  clopidogrel (PLAVIX) 75 MG tablet TAKE 1 TABLET BY MOUTH ONCE A DAY. 12/18/21   Lindell Spar, MD  cyanocobalamin 1000 MCG tablet Take 1,000 mcg by mouth 2 (two) times daily.    [provider]  famotidine (PEPCID) 20 MG tablet Take 1 tablet (20 mg total) by mouth at bedtime. 10/02/20   Lindell Spar, MD  Ferrous Sulfate (IRON) 325 (65 Fe) MG TABS 1 tablet daily 03/04/20   Alycia Rossetti, MD  gabapentin (NEURONTIN) 300 MG capsule 3 CAPSULES 3 TIMES DAILY. 07/17/21   Pieter Partridge, DO  HYDROcodone-acetaminophen (NORCO/VICODIN) 5-325 MG tablet Take one tab po q 4 hrs  prn pain 07/31/21   Triplett, Tammy, PA-C  magnesium oxide (MAG-OX) 400 (241.3 Mg) MG tablet Take 1 tablet (400 mg total) by mouth 2 (two) times daily. 07/19/19   Derek Jack, MD  naproxen (NAPROSYN) 375 MG tablet Take 1 tablet (375 mg total) by mouth 2 (two) times daily. Take with food 07/31/21   Triplett, Tammy, PA-C  pantoprazole (PROTONIX) 40 MG tablet TAKE ONE TABLET BY MOUTH DAILY. 04/01/21   Lindell Spar, MD  propranolol (INDERAL) 20 MG tablet TAKE (1) TABLET BY MOUTH TWICE DAILY. 05/26/21   Lindell Spar, MD  rOPINIRole (REQUIP) 0.5 MG tablet Take 1 tablet (0.5 mg total) by mouth at bedtime. 08/08/21   Pieter Partridge, DO  simvastatin (ZOCOR) 40 MG tablet Take 1 tablet (40 mg total) by mouth at bedtime. 02/13/21   Lindell Spar, MD      Allergies    Codeine, Morphine and related, and Percocet [oxycodone-acetaminophen]    Review of Systems   Review of Systems  All other systems reviewed and are negative.   Physical Exam Updated Vital Signs BP (!) 158/93   Pulse 63   Temp 98.2 F (36.8 C) (Oral)   Resp 18   SpO2 95%  Physical Exam Vitals and nursing note reviewed.   72 year old male, resting comfortably and in no acute distress. Vital signs are significant for elevated blood pressure. Oxygen  saturation is 95%, which is normal. Head is normocephalic and atraumatic. PERRLA, EOMI. Oropharynx is clear. Neck is nontender and supple without adenopathy or JVD. Lungs are clear without rales, wheezes, or rhonchi. Chest is nontender. Heart has regular rate and rhythm without murmur. Abdomen is soft, flat, nontender.  Bladder is not distended at my exam, but Foley catheter had already been cleared and was draining prior to my evaluating the patient. Genitalia: Foley catheter in place. Extremities have no cyanosis or edema, full range of motion is present. Skin is warm and dry without rash. Neurologic: Mental status is normal, cranial nerves are intact, moves all extremities  equally.  ED Results / Procedures / Treatments    Procedures Procedures    Medications Ordered in ED Medications - No data to display  ED Course/ Medical Decision Making/ A&P                             Medical Decision Making  Occluded Foley catheter.  Prior to my seeing the patient, triage nurse irrigated the catheter and did obtain a clot.  Following this, catheter is draining well.  The catheter is a 40 French catheter which is an adequate size for managing hematuria.  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  I have instructed him on how to irrigate his catheter as needed.  He had been referred to urology for follow-up and he is to keep that appointment.  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  I have reviewed his old records confirming ED visit yesterday for gross hematuria with urinary retention and placement of Foley catheter.  Final Clinical Impression(s) / ED Diagnoses Final diagnoses:  Retention of urine due to occlusion of Foley catheter [facility])    Rx / DC Orders ED Discharge Orders     None         Delora Fuel, MD [id number] [phone]

## 2022-05-18 NOTE — ED Notes (Signed)
Pts foley irrigated with 64ml of sterile water. Several clots able to be removed from foley, foley currently draining urine.

## 2022-05-18 NOTE — ED Triage Notes (Signed)
Pt was seen here yesterday for hematuria and urinary retention. Had a foley placed, believes it is clotted off. States it is no longer flowing and he feels bladder pressure.

## 2022-05-26 ENCOUNTER — Telehealth: Payer: Self-pay | Admitting: Urology

## 2022-05-26 ENCOUNTER — Emergency Department (HOSPITAL_COMMUNITY)
Admission: EM | Admit: 2022-05-26 | Discharge: 2022-05-26 | Discharge status: Against Medical Advice | Payer: Medicare Other

## 2022-05-26 NOTE — ED Triage Notes (Signed)
Pt called x3 with no response. Pt not visualized in the lobby.

## 2022-05-26 NOTE — Telephone Encounter (Signed)
Patient called in regards to his new patient appointment tomorrow with Dr. Nevada Crane. Patient stated he was running a fever of 102, had infection & bleeding going on. Due to no providers in the office, I ran this by the nurse and she stated patient needs to go to the ER asap. I let patient know nurse's recommendation and he was agreeable. Patient wanted his appointment left on the schedule for in the morning.

## 2022-05-27 ENCOUNTER — Ambulatory Visit: Payer: Medicare Other | Admitting: Urology

## 2022-05-27 ENCOUNTER — Encounter: Payer: Self-pay | Admitting: Urology

## 2022-05-27 VITALS — BP 121/76 | HR 65 | Ht 68.0 in | Wt 175.0 lb

## 2022-05-27 DIAGNOSIS — C61 Malignant neoplasm of prostate: Secondary | ICD-10-CM

## 2022-05-27 DIAGNOSIS — R339 Retention of urine, unspecified: Secondary | ICD-10-CM | POA: Diagnosis not present

## 2022-05-27 DIAGNOSIS — R310 Gross hematuria: Secondary | ICD-10-CM

## 2022-05-27 LAB — BASIC METABOLIC PANEL
BUN/Creatinine Ratio: 9 — ABNORMAL LOW (ref 10–24)
BUN: 11 mg/dL (ref 8–27)
CO2: 22 mmol/L (ref 20–29)
Calcium: 9.3 mg/dL (ref 8.6–10.2)
Chloride: 101 mmol/L (ref 96–106)
Creatinine, Ser: 1.17 mg/dL (ref 0.76–1.27)
Glucose: 101 mg/dL — ABNORMAL HIGH (ref 70–99)
Potassium: 4.1 mmol/L (ref 3.5–5.2)
Sodium: 139 mmol/L (ref 134–144)
eGFR: 66 mL/min/{1.73_m2} (ref >59)

## 2022-05-27 MED ORDER — TAMSULOSIN HCL 0.4 MG PO CAPS: 0.4000 mg | ORAL_CAPSULE | Freq: Every day | ORAL | 3 refills | Status: DC

## 2022-05-27 MED ORDER — CEFTRIAXONE SODIUM 1 G IJ SOLR
1.0000 g | Freq: Once | INTRAMUSCULAR | Status: AC
Start: 2022-05-27 — End: 2022-05-27
  Administered 2022-05-27: 1 g via INTRAMUSCULAR

## 2022-05-27 MED ORDER — CEFDINIR 300 MG PO CAPS: 300.0000 mg | ORAL_CAPSULE | Freq: Two times a day (BID) | ORAL | 0 refills | Status: AC

## 2022-05-27 NOTE — Progress Notes (Addendum)
IM Injection  Patient is present today for an IM Injection for treatment of urinary retention Drug: Ceftriaxone Dose:1g Location:[facility] Lot: [id number] Exp:04/2024 Patient tolerated well, no complications were noted  Performed by: Baltazar Najjar., CMA(AAMA)   Catheter Removal  Patient is present today for a catheter removal.  26ml of water was drained from the balloon. A 22FR foley cath was removed from the bladder, no complications were noted. Patient tolerated well.  Performed by: Baltazar Najjar., CMA(AAMA)

## 2022-05-27 NOTE — Addendum Note (Signed)
Addended by: Andria Rhein L on: 05/27/2022 10:00 AM   Modules accepted: Orders

## 2022-05-27 NOTE — Addendum Note (Signed)
Addended by: Pamala Hurry on: 05/27/2022 09:45 AM   Modules accepted: Orders

## 2022-05-27 NOTE — Progress Notes (Signed)
Assessment: 1. Malignant neoplasm of prostate   2. Urinary retention   3. Gross hematuria      Plan: Today I had a long discussion with the patient regarding his history of prostate cancer status post RT with recent gross hematuria/clot retention. Given that his urine has been clear for the last several days we will remove catheter today for voiding trial.  He knows to return this afternoon if he has further difficulty voiding. Will further evaluate as well with CT scan and follow-up cystoscopy in a few weeks. BMP today Rx: Omnicef 300 twice daily for 5 days Rx: Tamsulosin 0.4 mg daily Rationale as well as nature of medications including potential side effects and adverse events discussed in detail.  Chief Complaint: Urinary retention  History of Present Illness:  Cory Foley is a 72 y.o. male who is seen in consultation from Garnet Sierras, NP for evaluation of gross hematuria/clot retention. Patient has a past medical history of anxiety, osteoporosis, hyperlipidemia, COPD, GERD, arthritis, BPH, prostate cancer s/p radiation therapy in 2014 presents to the ED 05/17/2022 for evaluation of urinary retention and frank hematuria.   His urologist was Dr. Kellie Simmering in [facility] Prostate cancer-high risk; Gleason 4+5 = 9; PSA = 17; completed neoadjuvant ADT plus RT.  Previously he had not had gross hematuria.  He was in clot retention with bladder scan demonstrating 600 mL.  Patient reports that his urine has subsequently been clear without evidence of visible hematuria.  Last PSA 02/2021 = 0.01.    Past Medical History:  Past Medical History:  Diagnosis Date   Allergy    Anxiety    Arthritis    BPH with obstruction/lower urinary tract symptoms    Cataract    COPD, mild (HCC)    current smoker   ED (erectile dysfunction)    GERD (gastroesophageal reflux disease)    Hyperlipidemia    Nocturia    Osteoporosis    PAD (peripheral artery disease) ([place])    Port-A-Cath in  place 04/28/2019   Right   Prostate cancer ([place]) 01/11/2012   Adenocarcinoma, Gleason=4+4=8, & 4+5=9,PSA=17.24, Volume= 15.45cc   PVD (peripheral vascular disease) ([place])    Restless leg syndrome    Tongue mass    SCC    Past Surgical History:  Past Surgical History:  Procedure Laterality Date   ABDOMINAL AORTOGRAM W/LOWER EXTREMITY N/A 08/16/2018   Procedure: ABDOMINAL AORTOGRAM W/LOWER EXTREMITY;  Surgeon: Serafina Mitchell, MD;  Location: [place] [facility];  Service: Cardiovascular;  Laterality: N/A;  Bilateral   COLONOSCOPY N/A 09/28/2012   Procedure: COLONOSCOPY;  Surgeon: Danie Binder, MD;  [facility];  Service: Endoscopy;  Laterality: N/A;  8:30   DIRECT LARYNGOSCOPY N/A 04/03/2019   Procedure: DIRECT LARYNGOSCOPY;  Surgeon: Leta Baptist, MD;  Location: [place];  Service: ENT;  Laterality: N/A;   ESOPHAGOGASTRODUODENOSCOPY N/A 09/28/2012   Procedure: ESOPHAGOGASTRODUODENOSCOPY (EGD);  Surgeon: Danie Binder, MD;  [facility];  Service: Endoscopy;  Laterality: N/A;   Gold seed implatation  04/28/2012   HERNIA REPAIR     Bilateral inguinal X2   IR IMAGING GUIDED PORT INSERTION  05/03/2019   Right   JOINT REPLACEMENT     bilateral   KNEE SURGERY     Left Knee X 2   and Right knee X1   MALONEY DILATION N/A 09/28/2012   Procedure: MALONEY DILATION;  Surgeon: Danie Binder, MD;  [facility];  Service: Endoscopy;  Laterality: N/A;   NOSE SURGERY     PERIPHERAL VASCULAR INTERVENTION  08/16/2018   Procedure: PERIPHERAL VASCULAR INTERVENTION;  Surgeon: Serafina Mitchell, MD;  Location: [place] [facility];  Service: Cardiovascular;;  LT Iliac   PORT-A-CATH REMOVAL Right 03/06/2020   Procedure: MINOR REMOVAL PORT-A-CATH;  Surgeon: [name], MD;  [facility];  Service: General;  Laterality: Right;   PR VEIN BYPASS GRAFT,AORTO-FEM-POP  10/21/10   Left AK to BK popliteal BPG   PROSTATE BIOPSY  01/11/2012   Adenocarcinoma    PROSTATE SURGERY  2015   Chemo and  Radiation   SAVORY DILATION N/A 09/28/2012   Procedure: SAVORY DILATION;  Surgeon: Danie Binder, MD;  [facility];  Service: Endoscopy;  Laterality: N/A;   throat biopsy  03/20/2019   TONSILLECTOMY Left 04/03/2019   Procedure: TONSILLECTOMY;  Surgeon: Leta Baptist, MD;  Location: [place];  Service: ENT;  Laterality: Left;    Allergies:  Allergies  Allergen Reactions   Codeine Other (See Comments)    Bad headache and sweats   Morphine And Related Itching    sweats   Percocet [Oxycodone-Acetaminophen] Other (See Comments)    Headache and sweats    Family History:  Family History  Problem Relation Age of Onset   Heart disease Mother        Valve regurgitation and Pacemaker    Diabetes Mother    Hyperlipidemia Mother    Heart disease Father        CABG x 5   Hyperlipidemia Father    Hypertension Father    Heart attack Father    Heart disease Sister        aortic valve replacement   Cancer Sister 52       Colon cancer w/ metastasis   Throat cancer Sister    Hypertension Brother    Heart disease Maternal Grandmother    Heart disease Maternal Grandfather    Heart disease Paternal Grandmother    Cancer Paternal Grandfather    Healthy Son    Healthy Daughter     Social History:  Social History   Tobacco Use   Smoking status: Every Day    Packs/day: 0.50    Years: 43.00    Additional pack years: 0.00    Total pack years: 21.50    Types: Cigarettes   Smokeless tobacco: Never   Tobacco comments:    1/2 ppd  Vaping Use   Vaping Use: Never used  Substance Use Topics   Alcohol use: No    Comment: quit 1.5 years ago   Drug use: No    Review of symptoms:  Constitutional:  Negative for unexplained weight loss, night sweats, fever, chills ENT:  Negative for nose bleeds, sinus pain, painful swallowing CV:  Negative for chest pain, shortness of breath, exercise intolerance, palpitations, loss of  consciousness Resp:  Negative for cough, wheezing, shortness of breath GI:  Negative for nausea, vomiting, diarrhea, bloody stools GU:  Positives noted in HPI; otherwise negative for gross hematuria, dysuria, urinary incontinence Neuro:  Negative for seizures, poor balance, limb weakness, slurred speech Psych:  Negative for lack of energy, depression, anxiety Endocrine:  Negative for polydipsia, polyuria, symptoms of hypoglycemia (dizziness, hunger, sweating) Hematologic:  Negative for anemia, purpura, petechia, prolonged or excessive bleeding, use of anticoagulants  Allergic:  Negative for difficulty breathing or choking as a result of exposure to anything; no shellfish allergy; no allergic response (rash/itch) to materials, foods  Physical exam: BP 121/76   Pulse 65   Ht 5\' 8"  (1.727 m)   Wt 175 lb (79.4 kg)   BMI 26.61 kg/m  GENERAL APPEARANCE:  Well appearing, well developed, well nourished, NAD  GU: Foley catheter in place

## 2022-06-10 ENCOUNTER — Ambulatory Visit: Payer: Self-pay | Admitting: Physician Assistant

## 2022-06-10 ENCOUNTER — Ambulatory Visit (HOSPITAL_COMMUNITY): Admission: RE | Admit: 2022-06-10 | Payer: Medicare Other | Source: Ambulatory Visit

## 2022-06-10 ENCOUNTER — Ambulatory Visit (HOSPITAL_COMMUNITY): Payer: Medicare Other

## 2022-06-10 ENCOUNTER — Ambulatory Visit: Payer: Self-pay | Admitting: Urology

## 2022-06-16 ENCOUNTER — Ambulatory Visit (HOSPITAL_COMMUNITY)
Admission: RE | Admit: 2022-06-16 | Discharge: 2022-06-16 | Disposition: A | Discharge status: Home / Self Care | Payer: Medicare Other | Source: Ambulatory Visit | Attending: Urology | Admitting: Urology

## 2022-06-16 DIAGNOSIS — R310 Gross hematuria: Secondary | ICD-10-CM | POA: Insufficient documentation

## 2022-06-16 MED ORDER — IOHEXOL 300 MG/ML  SOLN
125.0000 mL | Freq: Once | INTRAMUSCULAR | Status: AC | PRN
  Administered 2022-06-16: 125 mL via INTRAVENOUS

## 2022-06-17 ENCOUNTER — Encounter: Payer: Self-pay | Admitting: Urology

## 2022-06-17 ENCOUNTER — Ambulatory Visit: Payer: Medicare Other | Admitting: Urology

## 2022-06-17 VITALS — BP 135/74 | HR 64 | Ht 68.0 in | Wt 170.0 lb

## 2022-06-17 DIAGNOSIS — Z8546 Personal history of malignant neoplasm of prostate: Secondary | ICD-10-CM

## 2022-06-17 DIAGNOSIS — R399 Unspecified symptoms and signs involving the genitourinary system: Secondary | ICD-10-CM | POA: Diagnosis not present

## 2022-06-17 DIAGNOSIS — N99111 Postprocedural bulbous urethral stricture: Secondary | ICD-10-CM

## 2022-06-17 DIAGNOSIS — R310 Gross hematuria: Secondary | ICD-10-CM | POA: Diagnosis not present

## 2022-06-17 DIAGNOSIS — R339 Retention of urine, unspecified: Secondary | ICD-10-CM

## 2022-06-17 DIAGNOSIS — C61 Malignant neoplasm of prostate: Secondary | ICD-10-CM

## 2022-06-17 DIAGNOSIS — N3040 Irradiation cystitis without hematuria: Secondary | ICD-10-CM | POA: Diagnosis not present

## 2022-06-17 LAB — MICROSCOPIC EXAMINATION
Cast Type: NONE SEEN
Casts: NONE SEEN /lpf
Crystal Type: NONE SEEN
Crystals: NONE SEEN
Mucus, UA: NONE SEEN
Renal Epithel, UA: NONE SEEN /hpf
Trichomonas, UA: NONE SEEN
Yeast, UA: NONE SEEN

## 2022-06-17 LAB — URINALYSIS, ROUTINE W REFLEX MICROSCOPIC
Bilirubin, UA: NEGATIVE
Glucose, UA: NEGATIVE
Ketones, UA: NEGATIVE
Leukocytes,UA: NEGATIVE
Nitrite, UA: NEGATIVE
Protein,UA: NEGATIVE
Specific Gravity, UA: 1.02 (ref 1.005–1.030)
Urobilinogen, Ur: 0.2 mg/dL (ref 0.2–1.0)
pH, UA: 5.5 (ref 5.0–7.5)

## 2022-06-17 MED ORDER — SULFAMETHOXAZOLE-TRIMETHOPRIM 800-160 MG PO TABS
1.0000 | ORAL_TABLET | Freq: Once | ORAL | Status: AC
Start: 2022-06-17 — End: 2022-06-17
  Administered 2022-06-17: 1 via ORAL

## 2022-06-17 MED ORDER — FINASTERIDE 5 MG PO TABS
5.0000 mg | ORAL_TABLET | Freq: Every day | ORAL | 3 refills | Status: DC
Start: 2022-06-17 — End: 2023-03-23

## 2022-06-17 NOTE — Progress Notes (Signed)
Assessment: 1. Gross hematuria   2. Urinary retention   3. Prostate cancer   4. Radiation cystitis     Plan: Psa today  Follow-up bladder washing for cytology and final CT report  Discussed findings of radiation cystitis along with friable prostatic urethra as well as area of bulbar urethral stricture.  Will begin additional med tx with finasteride  daily  FU 66mo with pvr  Chief Complaint: Follow-up today for cystoscopy  HPI: Cory Foley is a 72 y.o. male who presents for continued evaluation of recent gross hematuria complicated by clot retention.. See my note 05/27/2022 at the time of initial visit for detailed history and exam.  Patient has h/o Prostate cancer-high risk; Gleason 4+5 = 9; PSA = 17; completed neoadjuvant ADT plus RT 2014.  Also has hx of tonsillar cancer s/p chemo/RT.   Previously he had not had gross hematuria.  He was in clot retention with bladder scan demonstrating 600 mL.   Patient reports that his urine has subsequently been clear without evidence of visible hematuria.  He does have significant LUTS and states that the tamsulosin may have helped slightly.   Last PSA 02/2021 = 0.01.    Portions of the above documentation were copied from a prior visit for review purposes only.  Allergies: Allergies  Allergen Reactions   Codeine Other (See Comments)    Bad headache and sweats   Morphine And Related Itching    sweats   Percocet [Oxycodone-Acetaminophen] Other (See Comments)    Headache and sweats    PMH: Past Medical History:  Diagnosis Date   Allergy    Anxiety    Arthritis    BPH with obstruction/lower urinary tract symptoms    Cataract    COPD, mild    current smoker   ED (erectile dysfunction)    GERD (gastroesophageal reflux disease)    Hyperlipidemia    Nocturia    Osteoporosis    PAD (peripheral artery disease)    Port-A-Cath in place 04/28/2019   Right   Prostate cancer 01/11/2012   Adenocarcinoma, Gleason=4+4=8, &  4+5=9,PSA=17.24, Volume= 15.45cc   PVD (peripheral vascular disease)    Restless leg syndrome    Tongue mass    SCC    PSH: Past Surgical History:  Procedure Laterality Date   ABDOMINAL AORTOGRAM W/LOWER EXTREMITY N/A 08/16/2018   Procedure: ABDOMINAL AORTOGRAM W/LOWER EXTREMITY;  Surgeon: Nada Libman, MD;  [facility];  Service: Cardiovascular;  Laterality: N/A;  Bilateral   COLONOSCOPY N/A 09/28/2012   Procedure: COLONOSCOPY;  Surgeon: West Bali, MD;  [facility];  Service: Endoscopy;  Laterality: N/A;  8:30   DIRECT LARYNGOSCOPY N/A 04/03/2019   Procedure: DIRECT LARYNGOSCOPY;  Surgeon: Newman Pies, MD;  Location: [facility];  Service: ENT;  Laterality: N/A;   ESOPHAGOGASTRODUODENOSCOPY N/A 09/28/2012   Procedure: ESOPHAGOGASTRODUODENOSCOPY (EGD);  Surgeon: West Bali, MD;  [facility];  Service: Endoscopy;  Laterality: N/A;   Gold seed implatation  04/28/2012   HERNIA REPAIR     Bilateral inguinal X2   IR IMAGING GUIDED PORT INSERTION  05/03/2019   Right   JOINT REPLACEMENT     bilateral   KNEE SURGERY     Left Knee X 2   and Right knee X1   MALONEY DILATION N/A 09/28/2012   Procedure: MALONEY DILATION;  Surgeon: West Bali, MD;  [facility];  Service: Endoscopy;  Laterality: N/A;  NOSE SURGERY     PERIPHERAL VASCULAR INTERVENTION  08/16/2018   Procedure: PERIPHERAL VASCULAR INTERVENTION;  Surgeon: Nada Libman, MD;  [facility];  Service: Cardiovascular;;  LT Iliac   PORT-A-CATH REMOVAL Right 03/06/2020   Procedure: MINOR REMOVAL PORT-A-CATH;  Surgeon: Lucretia Roers, MD;  [facility];  Service: General;  Laterality: Right;   PR VEIN BYPASS GRAFT,AORTO-FEM-POP  10/21/10   Left AK to BK popliteal BPG   PROSTATE BIOPSY  01/11/2012   Adenocarcinoma   PROSTATE SURGERY  2015   Chemo and  Radiation   SAVORY DILATION N/A 09/28/2012   Procedure: SAVORY DILATION;  Surgeon: West Bali, MD;  [facility];  Service: Endoscopy;  Laterality: N/A;   throat biopsy  03/20/2019   TONSILLECTOMY Left 04/03/2019   Procedure: TONSILLECTOMY;  Surgeon: Newman Pies, MD;  Location: [facility];  Service: ENT;  Laterality: Left;    SH: Social History   Tobacco Use   Smoking status: Every Day    Packs/day: 0.50    Years: 43.00    Additional pack years: 0.00    Total pack years: 21.50    Types: Cigarettes   Smokeless tobacco: Never   Tobacco comments:    1/2 ppd  Vaping Use   Vaping Use: Never used  Substance Use Topics   Alcohol use: No    Comment: quit 1.5 years ago   Drug use: No    ROS: Constitutional:  Negative for fever, chills, weight loss CV: Negative for chest pain, previous MI, hypertension Respiratory:  Negative for shortness of breath, wheezing, sleep apnea, frequent cough GI:  Negative for nausea, vomiting, bloody stool, GERD  PE: BP 135/74   Pulse 64   Ht  (1.727 m)   Wt 170 lb (77.1 kg)   BMI 25.85 kg/m  GENERAL APPEARANCE:  Well appearing, well developed, well nourished, NAD    PROCEDURE: Flexible cystoscopy  Indication: Gross hematuria  Description of procedure: Patient was brought to the procedure room and correctly identified.  Informed consent was obtained.  Preprocedural timeout was performed. External genitalia were prepped and draped with the patient in the supine position.  Flexible cystoscopy was subsequently performed.  Anterior urethra-normal to the level of the bulb where there was noted to be a moderate stricture with what appears to be necrotic urothelium-scope easily passes into the prostatic urethra where there is quite friable urothelium and dilated vessels that look like they could bleed easily.  Bladder subsequently entered and carefully inspected.  There are patchy areas consistent with radiation cystitis.  No evidence of tumor.  Ureteral openings appeared normal.  Bladder washing was obtained  for cytology.  Procedure was well-tolerated-no complications.

## 2022-06-17 NOTE — Addendum Note (Signed)
Addended by: Carolin Coy on: 06/17/2022 03:18 PM   Modules accepted: Orders

## 2022-06-18 LAB — PSA: Prostate Specific Ag, Serum: <0.1 ng/mL (ref 0.0–4.0)

## 2022-06-22 ENCOUNTER — Encounter: Payer: Self-pay | Admitting: Urology

## 2022-09-09 ENCOUNTER — Other Ambulatory Visit: Payer: Self-pay

## 2022-09-15 ENCOUNTER — Other Ambulatory Visit: Payer: Self-pay | Admitting: Internal Medicine

## 2022-09-17 ENCOUNTER — Encounter: Payer: Self-pay | Admitting: Urology

## 2022-09-17 ENCOUNTER — Ambulatory Visit: Payer: Medicare Other | Admitting: Urology

## 2022-09-17 VITALS — BP 138/74 | HR 65

## 2022-09-17 DIAGNOSIS — R399 Unspecified symptoms and signs involving the genitourinary system: Secondary | ICD-10-CM

## 2022-09-17 DIAGNOSIS — C61 Malignant neoplasm of prostate: Secondary | ICD-10-CM

## 2022-09-17 DIAGNOSIS — N3040 Irradiation cystitis without hematuria: Secondary | ICD-10-CM

## 2022-09-17 LAB — URINALYSIS, ROUTINE W REFLEX MICROSCOPIC
Bilirubin, UA: NEGATIVE
Glucose, UA: NEGATIVE
Ketones, UA: NEGATIVE
Leukocytes,UA: NEGATIVE
Nitrite, UA: NEGATIVE
Protein,UA: NEGATIVE
RBC, UA: NEGATIVE
Specific Gravity, UA: 1.015 (ref 1.005–1.030)
Urobilinogen, Ur: 0.2 mg/dL (ref 0.2–1.0)
pH, UA: 5.5 (ref 5.0–7.5)

## 2022-09-17 LAB — BLADDER SCAN AMB NON-IMAGING

## 2022-09-17 NOTE — Progress Notes (Signed)
Assessment: 1. Lower urinary tract symptoms (LUTS)   2. Malignant neoplasm of prostate (HCC)   3. Radiation cystitis     Plan: Today I had a long discussion with the patient regarding additional management options regarding his lower urinary tract symptoms.  Following our discussion, he would like to begin additional medical therapy. Gemtesa-samples provided--he will let us know if it works for him and we could then provide Rx/ ?myrbetriq or vesicare? Patient will continue tamsulosin 0.4 mg daily We also discussed with him continuing monitoring his prostate cancer with yearly PSA. Also discussed the issue of potential for recurrent hematuria given his radiation cystitis.  Chief Complaint: luts  HPI: Cory Foley is a 72 y.o. male who presents for continued evaluation of a number of urologic issues.  Patient has significant luts largely starting after RT. He has a history of high risk prostate cancer--Gleason 4+5 = 9; PSA = 17; completed neoadjuvant ADT plus RT 2014 in [place].  Patient underwent hematuria evaluation 05/2022 for intermittent gross hematuria.  Cystoscopy demonstrated moderate bulbar stricture with what appears to be necrotic urothelium-scope easily passes into the prostatic urethra where there is quite friable urothelium and dilated vessels that look like they could bleed easily.Bladder demonstrated patchy areas consistent with radiation cystitis.  Cytology was negative.  Patient was started on tamsulosin 05/2022.  This has helped somewhat but he still has significant symptoms.  Today IPSS = 16 with most predominant issues being frequency urgency and nocturia.  PVR today= 38ml  PSA 05/2022 = <0.01  Portions of the above documentation were copied from a prior visit for review purposes only.  Allergies: Allergies  Allergen Reactions   Codeine Other (See Comments)    Bad headache and sweats   Morphine And Codeine Itching    sweats   Percocet  [Oxycodone-Acetaminophen] Other (See Comments)    Headache and sweats    PMH: Past Medical History:  Diagnosis Date   Allergy    Anxiety    Arthritis    BPH with obstruction/lower urinary tract symptoms    Cataract    COPD, mild (HCC)    current smoker   ED (erectile dysfunction)    GERD (gastroesophageal reflux disease)    Hyperlipidemia    Nocturia    Osteoporosis    PAD (peripheral artery disease) (HCC)    Port-A-Cath in place 04/28/2019   Right   Prostate cancer ([facility]) 01/11/2012   Adenocarcinoma, Gleason=4+4=8, & 4+5=9,PSA=17.24, Volume= 15.45cc   PVD (peripheral vascular disease) (HCC)    Restless leg syndrome    Tongue mass    SCC    PSH: Past Surgical History:  Procedure Laterality Date   ABDOMINAL AORTOGRAM W/LOWER EXTREMITY N/A 08/16/2018   Procedure: ABDOMINAL AORTOGRAM W/LOWER EXTREMITY;  Surgeon: Nada Libman, MD;  [facility];  Service: Cardiovascular;  Laterality: N/A;  Bilateral   COLONOSCOPY N/A 09/28/2012   Procedure: COLONOSCOPY;  Surgeon: West Bali, MD;  [facility];  Service: Endoscopy;  Laterality: N/A;  8:30   DIRECT LARYNGOSCOPY N/A 04/03/2019   Procedure: DIRECT LARYNGOSCOPY;  Surgeon: Newman Pies, MD;  Location: [facility];  Service: ENT;  Laterality: N/A;   ESOPHAGOGASTRODUODENOSCOPY N/A 09/28/2012   Procedure: ESOPHAGOGASTRODUODENOSCOPY (EGD);  Surgeon: West Bali, MD;  [facility];  Service: Endoscopy;  Laterality: N/A;   Gold seed implatation  04/28/2012   HERNIA REPAIR     Bilateral inguinal X2   IR IMAGING GUIDED PORT INSERTION  05/03/2019   Right   JOINT REPLACEMENT     bilateral   KNEE SURGERY     Left Knee X 2   and Right knee X1   MALONEY DILATION N/A 09/28/2012   Procedure: MALONEY DILATION;  Surgeon: West Bali, MD;  [facility];  Service: Endoscopy;  Laterality: N/A;   NOSE SURGERY     PERIPHERAL VASCULAR INTERVENTION  08/16/2018   Procedure: PERIPHERAL  VASCULAR INTERVENTION;  Surgeon: Nada Libman, MD;  [facility];  Service: Cardiovascular;;  LT Iliac   PORT-A-CATH REMOVAL Right 03/06/2020   Procedure: MINOR REMOVAL PORT-A-CATH;  Surgeon: Lucretia Roers, MD;  [facility];  Service: General;  Laterality: Right;   PR VEIN BYPASS GRAFT,AORTO-FEM-POP  10/21/10   Left AK to BK popliteal BPG   PROSTATE BIOPSY  01/11/2012   Adenocarcinoma   PROSTATE SURGERY  2015   Chemo and  Radiation   SAVORY DILATION N/A 09/28/2012   Procedure: SAVORY DILATION;  Surgeon: West Bali, MD;  [facility];  Service: Endoscopy;  Laterality: N/A;   throat biopsy  03/20/2019   TONSILLECTOMY Left 04/03/2019   Procedure: TONSILLECTOMY;  Surgeon: Newman Pies, MD;  Location: [facility];  Service: ENT;  Laterality: Left;    SH: Social History   Tobacco Use   Smoking status: Every Day    Current packs/day: 0.50    Average packs/day: 0.5 packs/day for 43.0 years (21.5 ttl pk-yrs)    Types: Cigarettes   Smokeless tobacco: Never   Tobacco comments:    1/2 ppd  Vaping Use   Vaping status: Never Used  Substance Use Topics   Alcohol use: No    Comment: quit 1.5 years ago   Drug use: No    ROS: Constitutional:  Negative for fever, chills, weight loss CV: Negative for chest pain, previous MI, hypertension Respiratory:  Negative for shortness of breath, wheezing, sleep apnea, frequent cough GI:  Negative for nausea, vomiting, bloody stool, GERD  PE: BP 138/74   Pulse 65  GENERAL APPEARANCE:  Well appearing, well developed, well nourished, NAD    Results: Results for orders placed or performed in visit on 09/17/22 (from the past 24 hour(s))  BLADDER SCAN AMB NON-IMAGING   Collection Time: 09/17/22 12:00 AM  Result Value Ref Range   Scan Result 38ml

## 2023-03-23 ENCOUNTER — Ambulatory Visit: Payer: Medicare Other | Admitting: Urology

## 2023-03-23 ENCOUNTER — Encounter: Payer: Self-pay | Admitting: Urology

## 2023-03-23 VITALS — BP 128/81 | HR 62

## 2023-03-23 DIAGNOSIS — R399 Unspecified symptoms and signs involving the genitourinary system: Secondary | ICD-10-CM

## 2023-03-23 DIAGNOSIS — C61 Malignant neoplasm of prostate: Secondary | ICD-10-CM

## 2023-03-23 LAB — URINALYSIS, ROUTINE W REFLEX MICROSCOPIC
Bilirubin, UA: NEGATIVE
Glucose, UA: NEGATIVE
Ketones, UA: NEGATIVE
Leukocytes,UA: NEGATIVE
Nitrite, UA: NEGATIVE
Protein,UA: NEGATIVE
RBC, UA: NEGATIVE
Specific Gravity, UA: 1.015 (ref 1.005–1.030)
Urobilinogen, Ur: 0.2 mg/dL (ref 0.2–1.0)
pH, UA: 7 (ref 5.0–7.5)

## 2023-03-23 MED ORDER — TAMSULOSIN HCL 0.4 MG PO CAPS: 0.4000 mg | ORAL_CAPSULE | Freq: Every day | ORAL | 3 refills | Status: AC

## 2023-03-23 MED ORDER — FINASTERIDE 5 MG PO TABS: 5.0000 mg | ORAL_TABLET | Freq: Every day | ORAL | 3 refills | Status: AC

## 2023-03-23 NOTE — Progress Notes (Signed)
Assessment: 1. Lower urinary tract symptoms (LUTS)   2. Prostate cancer (HCC)     Plan: Psa today Continue current meds (tamsulosin and finasteride) FU 1 yr or sooner if problems arise  Chief Complaint: No chief complaint on file.   HPI: Cory Foley is a 73 y.o. male who presents for continued evaluation of of a number of urologic issues.  Please see my note 05/27/2022 at the time of initial visit for detailed history.  Patient has a history of significant lower urinary tract symptoms that developed largely following radiation therapy for high risk prostate cancer.  Patient was started on combination medical therapy at that time with tamsulosin and finasteride.  Patient continues to have significant urinary frequency and urgency.  Patient admits to drinking coffee as his primary source of fluid intake throughout the day.  He understands that this is a bladder irritant and is likely contributing to his urinary frequency.  Patient reports today that he has had no further gross hematuria and feels like he is emptying well.  UA is entirely clear.  At the time of his hematuria evaluation he was noted to have significant changes consistent with radiation cystitis.  Patient is status post ADT plus RT in 2014 for Gleason 4+5 equal 9 CAP with a pretreatment PSA of 17 PSA 05/2022 = less than 0.1      Portions of the above documentation were copied from a prior visit for review purposes only.  Allergies: Allergies  Allergen Reactions   Codeine Other (See Comments)    Bad headache and sweats   Morphine And Codeine Itching    sweats   Percocet [Oxycodone-Acetaminophen] Other (See Comments)    Headache and sweats    PMH: Past Medical History:  Diagnosis Date   Allergy    Anxiety    Arthritis    BPH with obstruction/lower urinary tract symptoms    Cataract    COPD, mild (HCC)    current smoker   ED (erectile dysfunction)    GERD (gastroesophageal reflux disease)     Hyperlipidemia    Nocturia    Osteoporosis    PAD (peripheral artery disease) (HCC)    Port-A-Cath in place 04/28/2019   Right   Prostate cancer (HCC) 01/11/2012   Adenocarcinoma, Gleason=4+4=8, & 4+5=9,PSA=17.24, Volume= 15.45cc   PVD (peripheral vascular disease) (HCC)    Restless leg syndrome    Tongue mass    SCC    PSH: Past Surgical History:  Procedure Laterality Date   ABDOMINAL AORTOGRAM W/LOWER EXTREMITY N/A 08/16/2018   Procedure: ABDOMINAL AORTOGRAM W/LOWER EXTREMITY;  Surgeon: Nada Libman, MD;  [facility];  Service: Cardiovascular;  Laterality: N/A;  Bilateral   COLONOSCOPY N/A 09/28/2012   Procedure: COLONOSCOPY;  Surgeon: West Bali, MD;  [facility];  Service: Endoscopy;  Laterality: N/A;  8:30   DIRECT LARYNGOSCOPY N/A 04/03/2019   Procedure: DIRECT LARYNGOSCOPY;  Surgeon: Newman Pies, MD;  Location: [facility];  Service: ENT;  Laterality: N/A;   ESOPHAGOGASTRODUODENOSCOPY N/A 09/28/2012   Procedure: ESOPHAGOGASTRODUODENOSCOPY (EGD);  Surgeon: West Bali, MD;  [facility];  Service: Endoscopy;  Laterality: N/A;   Gold seed implatation  04/28/2012   HERNIA REPAIR     Bilateral inguinal X2   IR IMAGING GUIDED PORT INSERTION  05/03/2019   Right   JOINT REPLACEMENT     bilateral   KNEE SURGERY     Left Knee X 2  and Right knee X1   MALONEY DILATION N/A 09/28/2012   Procedure: MALONEY DILATION;  Surgeon: West Bali, MD;  [facility];  Service: Endoscopy;  Laterality: N/A;   NOSE SURGERY     PERIPHERAL VASCULAR INTERVENTION  08/16/2018   Procedure: PERIPHERAL VASCULAR INTERVENTION;  Surgeon: Nada Libman, MD;  [facility];  Service: Cardiovascular;;  LT Iliac   PORT-A-CATH REMOVAL Right 03/06/2020   Procedure: MINOR REMOVAL PORT-A-CATH;  Surgeon: Lucretia Roers, MD;  [facility];  Service: General;  Laterality: Right;   PR VEIN BYPASS GRAFT,AORTO-FEM-POP  10/21/10    Left AK to BK popliteal BPG   PROSTATE BIOPSY  01/11/2012   Adenocarcinoma   PROSTATE SURGERY  2015   Chemo and  Radiation   SAVORY DILATION N/A 09/28/2012   Procedure: SAVORY DILATION;  Surgeon: West Bali, MD;  [facility];  Service: Endoscopy;  Laterality: N/A;   throat biopsy  03/20/2019   TONSILLECTOMY Left 04/03/2019   Procedure: TONSILLECTOMY;  Surgeon: Newman Pies, MD;  Location: [facility];  Service: ENT;  Laterality: Left;    SH: Social History   Tobacco Use   Smoking status: Every Day    Current packs/day: 0.50    Average packs/day: 0.5 packs/day for 43.0 years (21.5 ttl pk-yrs)    Types: Cigarettes   Smokeless tobacco: Never   Tobacco comments:    1/2 ppd  Vaping Use   Vaping status: Never Used  Substance Use Topics   Alcohol use: No    Comment: quit 1.5 years ago   Drug use: No    ROS: Constitutional:  Negative for fever, chills, weight loss CV: Negative for chest pain, previous MI, hypertension Respiratory:  Negative for shortness of breath, wheezing, sleep apnea, frequent cough GI:  Negative for nausea, vomiting, bloody stool, GERD  PE: BP 128/81   Pulse 62  GENERAL APPEARANCE:  Well appearing, well developed, well nourished, NAD    Results: UA clear

## 2023-03-23 NOTE — Progress Notes (Signed)
............................................................................................................................................................................................................................................................................................................................................................................................................................................................................................................................................................................................................................................................................................................................................................................................................................................................................................................................................................................................................................................................................................................................................................................................................................................................................................................................................................................................................................................................................................................................................................................................................................................................................................................................................................................................................................................................................................................................  00000000[id number] 

## 2023-03-24 LAB — PSA: Prostate Specific Ag, Serum: <0.1 ng/mL (ref 0.0–4.0)

## 2024-03-22 ENCOUNTER — Ambulatory Visit: Payer: Medicare Other | Admitting: Urology

## 2024-04-04 ENCOUNTER — Ambulatory Visit: Admitting: Urology

## 2024-05-02 ENCOUNTER — Ambulatory Visit: Admitting: Urology

## 2024-07-26 ENCOUNTER — Ambulatory Visit: Admitting: Family Medicine
